# Patient Record
Sex: Female | Born: 1940 | Race: White | Hispanic: No | Smoking: Former smoker
Health system: Southern US, Community
[De-identification: ages and names within clinical notes are randomized; demographics above are authoritative.]

## PROBLEM LIST (undated history)

## (undated) ENCOUNTER — Emergency Department (HOSPITAL_COMMUNITY): Admission: EM | Payer: Medicare Other

## (undated) DIAGNOSIS — R5381 Other malaise: Secondary | ICD-10-CM

## (undated) DIAGNOSIS — F329 Major depressive disorder, single episode, unspecified: Secondary | ICD-10-CM

## (undated) DIAGNOSIS — N200 Calculus of kidney: Secondary | ICD-10-CM

## (undated) DIAGNOSIS — I609 Nontraumatic subarachnoid hemorrhage, unspecified: Secondary | ICD-10-CM

## (undated) DIAGNOSIS — C7A090 Malignant carcinoid tumor of the bronchus and lung: Secondary | ICD-10-CM

## (undated) DIAGNOSIS — R569 Unspecified convulsions: Secondary | ICD-10-CM

## (undated) DIAGNOSIS — M810 Age-related osteoporosis without current pathological fracture: Secondary | ICD-10-CM

## (undated) DIAGNOSIS — K831 Obstruction of bile duct: Secondary | ICD-10-CM

## (undated) DIAGNOSIS — F419 Anxiety disorder, unspecified: Secondary | ICD-10-CM

## (undated) DIAGNOSIS — K709 Alcoholic liver disease, unspecified: Secondary | ICD-10-CM

## (undated) DIAGNOSIS — K529 Noninfective gastroenteritis and colitis, unspecified: Secondary | ICD-10-CM

## (undated) DIAGNOSIS — K219 Gastro-esophageal reflux disease without esophagitis: Secondary | ICD-10-CM

## (undated) DIAGNOSIS — R296 Repeated falls: Secondary | ICD-10-CM

## (undated) DIAGNOSIS — F1020 Alcohol dependence, uncomplicated: Secondary | ICD-10-CM

## (undated) DIAGNOSIS — M1990 Unspecified osteoarthritis, unspecified site: Secondary | ICD-10-CM

## (undated) DIAGNOSIS — Z9189 Other specified personal risk factors, not elsewhere classified: Secondary | ICD-10-CM

## (undated) DIAGNOSIS — K769 Liver disease, unspecified: Secondary | ICD-10-CM

## (undated) DIAGNOSIS — F32A Depression, unspecified: Secondary | ICD-10-CM

## (undated) DIAGNOSIS — G5603 Carpal tunnel syndrome, bilateral upper limbs: Secondary | ICD-10-CM

## (undated) HISTORY — DX: Nontraumatic subarachnoid hemorrhage, unspecified: I60.9

## (undated) HISTORY — PX: ABDOMINAL HYSTERECTOMY: SHX81

## (undated) HISTORY — DX: Other specified personal risk factors, not elsewhere classified: Z91.89

## (undated) HISTORY — PX: TONSILLECTOMY: SUR1361

## (undated) HISTORY — PX: APPENDECTOMY: SHX54

## (undated) HISTORY — PX: TONSILLECTOMY: SHX5217

---

## 1999-05-16 ENCOUNTER — Ambulatory Visit (HOSPITAL_COMMUNITY): Admission: RE | Admit: 1999-05-16 | Discharge: 1999-05-16 | Payer: Self-pay | Admitting: Obstetrics & Gynecology

## 2000-10-28 ENCOUNTER — Other Ambulatory Visit: Admission: RE | Admit: 2000-10-28 | Discharge: 2000-10-28 | Payer: Self-pay | Admitting: Obstetrics and Gynecology

## 2000-11-10 ENCOUNTER — Encounter: Admission: RE | Admit: 2000-11-10 | Discharge: 2000-11-10 | Payer: Self-pay | Admitting: Obstetrics and Gynecology

## 2000-11-10 ENCOUNTER — Encounter: Payer: Self-pay | Admitting: Obstetrics and Gynecology

## 2002-11-03 ENCOUNTER — Ambulatory Visit (HOSPITAL_COMMUNITY): Admission: RE | Admit: 2002-11-03 | Discharge: 2002-11-03 | Payer: Self-pay | Admitting: Pulmonary Disease

## 2002-12-01 HISTORY — PX: SMALL INTESTINE SURGERY: SHX150

## 2002-12-01 HISTORY — PX: COLON RESECTION: SHX5231

## 2003-04-26 ENCOUNTER — Encounter: Admission: RE | Admit: 2003-04-26 | Discharge: 2003-04-26 | Payer: Self-pay | Admitting: Pulmonary Disease

## 2003-06-27 ENCOUNTER — Ambulatory Visit (HOSPITAL_COMMUNITY): Admission: RE | Admit: 2003-06-27 | Discharge: 2003-06-27 | Payer: Self-pay | Admitting: Internal Medicine

## 2003-06-28 ENCOUNTER — Encounter (INDEPENDENT_AMBULATORY_CARE_PROVIDER_SITE_OTHER): Payer: Self-pay | Admitting: Internal Medicine

## 2003-06-28 ENCOUNTER — Inpatient Hospital Stay (HOSPITAL_COMMUNITY): Admission: AD | Admit: 2003-06-28 | Discharge: 2003-07-04 | Payer: Medicare Other | Admitting: Internal Medicine

## 2003-06-29 ENCOUNTER — Encounter (INDEPENDENT_AMBULATORY_CARE_PROVIDER_SITE_OTHER): Payer: Self-pay | Admitting: Internal Medicine

## 2004-02-20 ENCOUNTER — Ambulatory Visit (HOSPITAL_COMMUNITY): Admission: RE | Admit: 2004-02-20 | Discharge: 2004-02-20 | Payer: Self-pay | Admitting: Internal Medicine

## 2004-03-29 ENCOUNTER — Ambulatory Visit (HOSPITAL_COMMUNITY): Admission: RE | Admit: 2004-03-29 | Discharge: 2004-03-29 | Payer: Self-pay | Admitting: Pulmonary Disease

## 2004-05-30 ENCOUNTER — Ambulatory Visit (HOSPITAL_COMMUNITY): Admission: RE | Admit: 2004-05-30 | Discharge: 2004-05-30 | Payer: Self-pay | Admitting: Pulmonary Disease

## 2004-11-19 ENCOUNTER — Ambulatory Visit: Payer: Self-pay | Admitting: Gastroenterology

## 2004-12-09 ENCOUNTER — Ambulatory Visit: Payer: Self-pay | Admitting: Gastroenterology

## 2004-12-10 ENCOUNTER — Ambulatory Visit: Payer: Self-pay | Admitting: Gastroenterology

## 2004-12-16 ENCOUNTER — Ambulatory Visit: Payer: Self-pay | Admitting: Gastroenterology

## 2004-12-24 ENCOUNTER — Ambulatory Visit: Payer: Self-pay | Admitting: Gastroenterology

## 2005-01-04 ENCOUNTER — Emergency Department (HOSPITAL_COMMUNITY): Admission: EM | Admit: 2005-01-04 | Discharge: 2005-01-04 | Payer: Self-pay | Admitting: Emergency Medicine

## 2005-02-27 ENCOUNTER — Ambulatory Visit: Payer: Self-pay | Admitting: Gastroenterology

## 2005-03-11 ENCOUNTER — Ambulatory Visit: Payer: Self-pay | Admitting: Gastroenterology

## 2005-12-01 HISTORY — PX: THORACOTOMY: SHX5074

## 2005-12-01 HISTORY — PX: THORACOTOMY: SUR1349

## 2005-12-10 ENCOUNTER — Ambulatory Visit (HOSPITAL_COMMUNITY): Admission: RE | Admit: 2005-12-10 | Discharge: 2005-12-10 | Payer: Self-pay | Admitting: Internal Medicine

## 2006-01-06 ENCOUNTER — Ambulatory Visit (HOSPITAL_COMMUNITY): Admission: RE | Admit: 2006-01-06 | Discharge: 2006-01-06 | Payer: Self-pay | Admitting: Orthopaedic Surgery

## 2006-02-10 ENCOUNTER — Ambulatory Visit (HOSPITAL_COMMUNITY): Admission: RE | Admit: 2006-02-10 | Discharge: 2006-02-10 | Payer: Self-pay | Admitting: Internal Medicine

## 2006-03-04 ENCOUNTER — Encounter: Admission: RE | Admit: 2006-03-04 | Discharge: 2006-03-04 | Payer: Self-pay | Admitting: Internal Medicine

## 2006-03-12 ENCOUNTER — Ambulatory Visit (HOSPITAL_COMMUNITY): Admission: RE | Admit: 2006-03-12 | Discharge: 2006-03-12 | Payer: Self-pay | Admitting: Internal Medicine

## 2006-03-30 ENCOUNTER — Encounter (INDEPENDENT_AMBULATORY_CARE_PROVIDER_SITE_OTHER): Payer: Self-pay | Admitting: *Deleted

## 2006-03-30 ENCOUNTER — Inpatient Hospital Stay (HOSPITAL_COMMUNITY): Admission: RE | Admit: 2006-03-30 | Discharge: 2006-04-03 | Payer: Self-pay | Admitting: Thoracic Surgery

## 2006-04-15 ENCOUNTER — Encounter: Admission: RE | Admit: 2006-04-15 | Discharge: 2006-04-15 | Payer: Self-pay | Admitting: Thoracic Surgery

## 2006-05-04 ENCOUNTER — Ambulatory Visit (HOSPITAL_COMMUNITY): Payer: Self-pay | Admitting: Oncology

## 2006-05-04 ENCOUNTER — Encounter: Admission: RE | Admit: 2006-05-04 | Discharge: 2006-05-04 | Payer: Self-pay | Admitting: Oncology

## 2006-05-13 ENCOUNTER — Encounter: Admission: RE | Admit: 2006-05-13 | Discharge: 2006-05-13 | Payer: Self-pay | Admitting: Thoracic Surgery

## 2006-07-13 ENCOUNTER — Emergency Department (HOSPITAL_COMMUNITY): Admission: EM | Admit: 2006-07-13 | Discharge: 2006-07-13 | Payer: Self-pay | Admitting: Emergency Medicine

## 2006-08-12 ENCOUNTER — Encounter: Admission: RE | Admit: 2006-08-12 | Discharge: 2006-08-12 | Payer: Self-pay | Admitting: Thoracic Surgery

## 2006-08-13 ENCOUNTER — Ambulatory Visit: Payer: Self-pay | Admitting: Gastroenterology

## 2006-08-17 ENCOUNTER — Ambulatory Visit: Payer: Self-pay | Admitting: Gastroenterology

## 2006-08-17 ENCOUNTER — Encounter (INDEPENDENT_AMBULATORY_CARE_PROVIDER_SITE_OTHER): Payer: Self-pay | Admitting: Specialist

## 2006-10-01 ENCOUNTER — Encounter: Admission: RE | Admit: 2006-10-01 | Discharge: 2006-10-01 | Payer: Self-pay | Admitting: Oncology

## 2006-10-20 ENCOUNTER — Ambulatory Visit (HOSPITAL_COMMUNITY): Payer: Self-pay | Admitting: Oncology

## 2006-11-05 ENCOUNTER — Encounter (HOSPITAL_COMMUNITY): Admission: RE | Admit: 2006-11-05 | Discharge: 2006-11-30 | Payer: Self-pay | Admitting: Oncology

## 2006-11-11 ENCOUNTER — Encounter: Admission: RE | Admit: 2006-11-11 | Discharge: 2006-11-11 | Payer: Self-pay | Admitting: Thoracic Surgery

## 2006-12-03 ENCOUNTER — Ambulatory Visit: Admission: RE | Admit: 2006-12-03 | Discharge: 2006-12-03 | Payer: Self-pay | Admitting: Oncology

## 2007-02-15 ENCOUNTER — Ambulatory Visit (HOSPITAL_COMMUNITY): Admission: RE | Admit: 2007-02-15 | Discharge: 2007-02-15 | Payer: Self-pay | Admitting: Internal Medicine

## 2007-04-08 ENCOUNTER — Encounter (HOSPITAL_COMMUNITY): Admission: RE | Admit: 2007-04-08 | Discharge: 2007-05-08 | Payer: Self-pay | Admitting: Oncology

## 2007-04-13 ENCOUNTER — Ambulatory Visit (HOSPITAL_COMMUNITY): Payer: Self-pay | Admitting: Oncology

## 2008-02-17 ENCOUNTER — Ambulatory Visit (HOSPITAL_COMMUNITY): Admission: RE | Admit: 2008-02-17 | Discharge: 2008-02-17 | Payer: Self-pay | Admitting: Internal Medicine

## 2008-06-27 ENCOUNTER — Ambulatory Visit (HOSPITAL_COMMUNITY): Admission: RE | Admit: 2008-06-27 | Discharge: 2008-06-27 | Payer: Self-pay | Admitting: Internal Medicine

## 2008-09-26 ENCOUNTER — Ambulatory Visit (HOSPITAL_COMMUNITY): Admission: RE | Admit: 2008-09-26 | Discharge: 2008-09-26 | Payer: Self-pay | Admitting: Internal Medicine

## 2008-10-30 ENCOUNTER — Ambulatory Visit (HOSPITAL_COMMUNITY): Admission: RE | Admit: 2008-10-30 | Discharge: 2008-10-30 | Payer: Self-pay | Admitting: Internal Medicine

## 2008-11-17 ENCOUNTER — Ambulatory Visit (HOSPITAL_BASED_OUTPATIENT_CLINIC_OR_DEPARTMENT_OTHER): Admission: RE | Admit: 2008-11-17 | Discharge: 2008-11-17 | Payer: Self-pay | Admitting: Urology

## 2008-11-17 ENCOUNTER — Encounter (INDEPENDENT_AMBULATORY_CARE_PROVIDER_SITE_OTHER): Payer: Self-pay | Admitting: Urology

## 2008-12-01 HISTORY — PX: CYSTOSCOPY W/ URETERAL STENT PLACEMENT: SHX1429

## 2009-02-02 ENCOUNTER — Encounter (INDEPENDENT_AMBULATORY_CARE_PROVIDER_SITE_OTHER): Payer: Self-pay | Admitting: Urology

## 2009-02-02 ENCOUNTER — Ambulatory Visit (HOSPITAL_BASED_OUTPATIENT_CLINIC_OR_DEPARTMENT_OTHER): Admission: RE | Admit: 2009-02-02 | Discharge: 2009-02-02 | Payer: Self-pay | Admitting: Urology

## 2009-02-19 ENCOUNTER — Ambulatory Visit (HOSPITAL_COMMUNITY): Admission: RE | Admit: 2009-02-19 | Discharge: 2009-02-19 | Payer: Self-pay | Admitting: Internal Medicine

## 2009-03-14 ENCOUNTER — Ambulatory Visit (HOSPITAL_COMMUNITY): Admission: RE | Admit: 2009-03-14 | Discharge: 2009-03-14 | Payer: Self-pay | Admitting: Internal Medicine

## 2010-02-21 ENCOUNTER — Ambulatory Visit (HOSPITAL_COMMUNITY): Admission: RE | Admit: 2010-02-21 | Discharge: 2010-02-21 | Payer: Self-pay | Admitting: Internal Medicine

## 2010-04-25 ENCOUNTER — Ambulatory Visit (HOSPITAL_COMMUNITY): Admission: RE | Admit: 2010-04-25 | Discharge: 2010-04-25 | Payer: Self-pay | Admitting: Internal Medicine

## 2010-08-26 ENCOUNTER — Emergency Department (HOSPITAL_COMMUNITY): Admission: EM | Admit: 2010-08-26 | Discharge: 2010-08-26 | Payer: Self-pay | Admitting: Emergency Medicine

## 2010-09-16 ENCOUNTER — Ambulatory Visit (HOSPITAL_COMMUNITY): Admission: RE | Admit: 2010-09-16 | Discharge: 2010-09-16 | Payer: Self-pay | Admitting: Urology

## 2010-12-22 ENCOUNTER — Encounter (HOSPITAL_COMMUNITY): Payer: Self-pay | Admitting: Oncology

## 2010-12-22 ENCOUNTER — Encounter: Payer: Self-pay | Admitting: Thoracic Surgery

## 2011-02-13 LAB — CBC
HCT: 35.7 % — ABNORMAL LOW (ref 36.0–46.0)
Hemoglobin: 11.9 g/dL — ABNORMAL LOW (ref 12.0–15.0)
MCH: 31.5 pg (ref 26.0–34.0)
MCHC: 33.5 g/dL (ref 30.0–36.0)
MCV: 94.2 fL (ref 78.0–100.0)
Platelets: 152 10*3/uL (ref 150–400)
RBC: 3.79 MIL/uL — ABNORMAL LOW (ref 3.87–5.11)
RDW: 13.6 % (ref 11.5–15.5)
WBC: 7.4 10*3/uL (ref 4.0–10.5)

## 2011-02-13 LAB — URINALYSIS, ROUTINE W REFLEX MICROSCOPIC
Bilirubin Urine: NEGATIVE
Glucose, UA: NEGATIVE mg/dL
Nitrite: NEGATIVE
Specific Gravity, Urine: >1.03 — ABNORMAL HIGH (ref 1.005–1.030)
Urobilinogen, UA: 0.2 mg/dL (ref 0.0–1.0)
pH: 5.5 (ref 5.0–8.0)

## 2011-02-13 LAB — DIFFERENTIAL
Basophils Absolute: 0 10*3/uL (ref 0.0–0.1)
Basophils Relative: 0 % (ref 0–1)
Eosinophils Absolute: 0 10*3/uL (ref 0.0–0.7)
Eosinophils Relative: 0 % (ref 0–5)
Lymphocytes Relative: 8 % — ABNORMAL LOW (ref 12–46)
Lymphs Abs: 0.6 10*3/uL — ABNORMAL LOW (ref 0.7–4.0)
Monocytes Absolute: 0.3 10*3/uL (ref 0.1–1.0)
Monocytes Relative: 4 % (ref 3–12)
Neutro Abs: 6.5 10*3/uL (ref 1.7–7.7)
Neutrophils Relative %: 88 % — ABNORMAL HIGH (ref 43–77)

## 2011-02-13 LAB — URINE MICROSCOPIC-ADD ON

## 2011-02-13 LAB — BASIC METABOLIC PANEL
BUN: 8 mg/dL (ref 6–23)
CO2: 24 mEq/L (ref 19–32)
Calcium: 9.3 mg/dL (ref 8.4–10.5)
Chloride: 107 mEq/L (ref 96–112)
Creatinine, Ser: 0.72 mg/dL (ref 0.4–1.2)
GFR calc Af Amer: >60 mL/min (ref >60)
GFR calc non Af Amer: >60 mL/min (ref >60)
Glucose, Bld: 97 mg/dL (ref 70–99)
Potassium: 3.6 mEq/L (ref 3.5–5.1)
Sodium: 140 mEq/L (ref 135–145)

## 2011-03-10 ENCOUNTER — Other Ambulatory Visit (HOSPITAL_COMMUNITY): Payer: Self-pay | Admitting: Internal Medicine

## 2011-03-10 DIAGNOSIS — Z139 Encounter for screening, unspecified: Secondary | ICD-10-CM

## 2011-03-11 ENCOUNTER — Ambulatory Visit (HOSPITAL_COMMUNITY)
Admission: RE | Admit: 2011-03-11 | Discharge: 2011-03-11 | Disposition: A | Discharge status: Home / Self Care | Payer: Medicare Other | Source: Ambulatory Visit | Attending: Internal Medicine | Admitting: Internal Medicine

## 2011-03-11 DIAGNOSIS — Z1231 Encounter for screening mammogram for malignant neoplasm of breast: Secondary | ICD-10-CM | POA: Insufficient documentation

## 2011-03-11 DIAGNOSIS — Z139 Encounter for screening, unspecified: Secondary | ICD-10-CM

## 2011-03-13 LAB — COMPREHENSIVE METABOLIC PANEL
ALT: 20 U/L (ref 0–35)
AST: 25 U/L (ref 0–37)
Albumin: 4.2 g/dL (ref 3.5–5.2)
Alkaline Phosphatase: 135 U/L — ABNORMAL HIGH (ref 39–117)
BUN: 10 mg/dL (ref 6–23)
CO2: 27 mEq/L (ref 19–32)
Calcium: 9.6 mg/dL (ref 8.4–10.5)
Chloride: 108 mEq/L (ref 96–112)
Creatinine, Ser: 0.77 mg/dL (ref 0.4–1.2)
GFR calc Af Amer: >60 mL/min (ref >60)
GFR calc non Af Amer: >60 mL/min (ref >60)
Glucose, Bld: 93 mg/dL (ref 70–99)
Potassium: 4.5 mEq/L (ref 3.5–5.1)
Sodium: 143 mEq/L (ref 135–145)
Total Bilirubin: 0.7 mg/dL (ref 0.3–1.2)
Total Protein: 6.7 g/dL (ref 6.0–8.3)

## 2011-03-13 LAB — CBC
HCT: 38.7 % (ref 36.0–46.0)
Hemoglobin: 12.5 g/dL (ref 12.0–15.0)
MCHC: 32.3 g/dL (ref 30.0–36.0)
MCV: 89.6 fL (ref 78.0–100.0)
Platelets: 217 10*3/uL (ref 150–400)
RBC: 4.31 MIL/uL (ref 3.87–5.11)
RDW: 14.4 % (ref 11.5–15.5)
WBC: 6.2 10*3/uL (ref 4.0–10.5)

## 2011-04-01 ENCOUNTER — Ambulatory Visit (INDEPENDENT_AMBULATORY_CARE_PROVIDER_SITE_OTHER): Payer: Medicare Other | Admitting: Urology

## 2011-04-01 ENCOUNTER — Ambulatory Visit (HOSPITAL_COMMUNITY)
Admission: RE | Admit: 2011-04-01 | Discharge: 2011-04-01 | Disposition: A | Discharge status: Home / Self Care | Payer: Medicare Other | Source: Ambulatory Visit | Attending: Internal Medicine | Admitting: Internal Medicine

## 2011-04-01 ENCOUNTER — Other Ambulatory Visit: Payer: Self-pay | Admitting: Urology

## 2011-04-01 ENCOUNTER — Ambulatory Visit (HOSPITAL_COMMUNITY)
Admission: RE | Admit: 2011-04-01 | Discharge: 2011-04-01 | Disposition: A | Discharge status: Home / Self Care | Payer: Medicare Other | Source: Ambulatory Visit | Attending: Urology | Admitting: Urology

## 2011-04-01 ENCOUNTER — Other Ambulatory Visit (HOSPITAL_COMMUNITY): Payer: Self-pay | Admitting: Internal Medicine

## 2011-04-01 DIAGNOSIS — R05 Cough: Secondary | ICD-10-CM

## 2011-04-01 DIAGNOSIS — N200 Calculus of kidney: Secondary | ICD-10-CM | POA: Insufficient documentation

## 2011-04-01 DIAGNOSIS — R059 Cough, unspecified: Secondary | ICD-10-CM

## 2011-04-01 DIAGNOSIS — R109 Unspecified abdominal pain: Secondary | ICD-10-CM | POA: Insufficient documentation

## 2011-04-15 NOTE — Op Note (Signed)
[name], [name]              ACCOUNT NO.:  [id number]   MEDICAL RECORD NO.:  [id number]          PATIENT TYPE:  AMB   LOCATION:  N[facility]C                         FACILITY:  [facility]   PHYSICIAN:  Ronald L. Rosana Hoes, M.D.  DATE OF BIRTH:  [birth year]   DATE OF PROCEDURE:  11/17/2008  DATE OF DISCHARGE:                               OPERATIVE REPORT   DIAGNOSIS:  Gross hematuria, left renal pelvic thickening on CT scan.   OPERATIVE PROCEDURE:  Cystourethroscopy, bilateral retrograde ureteral  pyelograms, left flexible ureterorenoscopy, biopsy of left ureteropelvic  junction lesion, brush biopsy of left ureteropelvic junction lesion and  left renal pelvic washings.   SURGEON:  Duane Lope. Rosana Hoes, M.D.   ANESTHESIA:  LMA.   ESTIMATED BLOOD LOSS:  Negligible.   TUBES:  A 26-cm 6-French Contour double pigtail stent.   DISPOSITION OF SPECIMENS:  To pathology.   INDICATIONS FOR PROCEDURE:  [name] is a very nice 70 year old white  female who presented with gross hematuria.  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  She has had colon surgery previously.  On  CT scan of the abdomen and pelvis, she was found to have what appeared  to be thickening of the left renal pelvis and after understanding the  risks, benefits and alternatives, would like to proceed with the above  procedure.   DESCRIPTION OF PROCEDURE:  The patient was placed in the supine  position.  After proper LMA anesthesia, she was placed in the dorsal  lithotomy position and prepped and draped with Betadine in a sterile  fashion.  Cystourethroscopy was performed with a 22.5-French Olympus  panendoscope.  Utilizing the 12 and 70-degree lenses, the bladder was  carefully inspected and noted to be without lesions.  A left retrograde  ureteral pyelogram was performed with a 6-French open-ended catheter.  There was noted to be a filling defect right at or slightly below the  ureteropelvic junction on the left  side.  It was approximately 5 mm in  diameter x 5 mm.  There appeared to be a slight proximal hydronephrosis.  A 0.038-French sensor wire was placed into the left renal pelvis.  A  dual lumen inserter was passed over it to the UPJ and a second 0.038-  French sensor wire was placed into the left renal pelvis.  The dual  lumen inserter was removed and an Olympus flexible ureterorenoscope was  placed into the left renal pelvis and 1 wire was removed.  Inspection of  the renal pelvis revealed there were no lesions and the calyces appeared  to be clear, however, at the ureteropelvic junction just distal to it,  was a circumferential frondular mass that was highly suspicious for  transitional cell carcinoma.  Utilizing cold cup biopsy forceps, a  biopsy was obtained from it, along with brush biopsies of it, and then  subsequent left renal pelvic washings with normal saline.  Each were  submitted to pathology.  Reinspection revealed that there was no  significant bleeding and no other lesions were noted throughout the  ureter.  Under fluoroscopic guidance,  a 26-cm 6-French Contour double  pigtail stent was placed.  It was in good position within the left renal  pelvis and within the bladder.  No pullout tether string was attached.  Next, right retrograde ureteral pyelogram was performed with a 6-French  open-ended catheter.  The entire collecting system  appeared to be normal on the right side:  The ureter, renal pelvis,  calyces, etc., and the system appeared to drain well.  The bladder was  drained.  The panendoscope was removed.  The patient was taken to the  recovery room stable.  All specimens were submitted to pathology.      Carney Rosana Hoes, M.D.  Electronically Signed     RLD/MEDQ  D:  11/17/2008  T:  11/18/2008  Job:  [id number]

## 2011-04-15 NOTE — Op Note (Signed)
[name], [name]              ACCOUNT NO.:  [id number]   MEDICAL RECORD NO.:  [id number]          PATIENT TYPE:  AMB   LOCATION:  N[facility]C                         FACILITY:  [facility]   PHYSICIAN:  Ronald L. Rosana Hoes, M.D.  DATE OF BIRTH:  [birth year]   DATE OF PROCEDURE:  02/02/2009  DATE OF DISCHARGE:                               OPERATIVE REPORT   DIAGNOSIS:  Left renal pelvic thickening, ureterolithiasis and  nephrolithiasis.   OPERATIVE PROCEDURE:  Cystourethroscopy, left retrograde pyelogram, left  flexible ureterorenoscopy, Holmium laser lithotripsy of large 6 mm stone  with partial fragment extraction and biopsy of left renal pelvis and  placement of left double-J stent.   SURGEON:  Abbie Sons, MD   ANESTHESIA:  LMA.   ESTIMATED BLOOD LOSS:  Negligible.   TUBES:  24 cm 7 [place] Contour double pigtail stent.   COMPLICATIONS:  None.   INDICATIONS FOR PROCEDURE:  [name] is a lovely 70 year old white  female who presented with originally some hematuria and was found to  have a thickened left renal pelvis along with nephrolithiasis.  She  underwent cysto and ureteroscopy with biopsies of the left renal pelvis  which showed atypia only.  There were some thickened areas that were  biopsied.  Postoperatively she returned and a stone had dislodged from  her kidney 6.6 mm at the UPJ and subsequently progressed to the upper  ureter.  After understanding risks, benefits and alternatives she has  elected to proceed with management of the left ureteral calculus and  rebiopsy of the area.   PROCEDURE IN DETAIL:  The patient was placed in supine position and  after proper LMA anesthesia was placed in the dorsal lithotomy position  and prepped and draped with Betadine in a sterile fashion.  Cystourethroscopy was performed with a 22.5 French Olympus panendoscope  utilizing 12 and 70 degree lenses.  The bladder was carefully inspected.  Both ureters were in normal location.  Under  fluoroscopic guidance a  0.038 French sensor wire was placed in the left renal pelvis.  The stone  could be visualized on fluoroscopy in the upper ureter.  Utilizing a  short ureteral access sheath and dilator the ureteral access sheath was  passed up to the ureteropelvic junction and the stone displaced into the  middle pole calyceal system.  Utilizing the digital flexible  ureterorenoscope the entire renal internal architecture was inspected  upper, middle and lower pole systems.  The stone was visualized in the  middle pole calyceal system and utilizing a 200 micron laser fiber on a  setting of 0.5 and repetition rate of 5 the stone was fragmented into  multiple sub millimeter fragments.  A few fragments were extracted with  a Nitinol basket for stone analysis but it was felt that further  extraction was not necessary.  It was essentially dust and that it would  injure the system to keep passing baskets.  On fluoroscopy further out  in the middle pole calyceal system at a separate branch calyx I noted  another stone.  This was visualized and there was some infundibular  stenosis and it was felt to get to the stone we would have to perform an  infundibulotomy with the laser.  We felt this had some danger and that  the stone was in the periphery, not growing and was probably not a  problem for passing at this point so we elected to leave this alone.  Inspection of the renal pelvis revealed some thickening  circumferentially near the ureteropelvic junction and this was biopsied  with the cold cup biopsy forceps and submitted to pathology.  Reinspection revealed good hemostasis was present.  No other lesions  were present.  Dye was injected through the scope to create a retrograde  ureteropyelogram and there was no extravasation.  No other filling  defects were noted.  The scope was visually removed.  A wire was placed  and under fluoroscopic guidance a 24 cm 7 French Contour double  pigtail  stent was placed and noted to be in good position within the left renal  pelvis within the bladder.  No pullout tether was attached.  The bladder  was drained.  The panendoscope was removed.  Again, fluoroscopic  confirmation of the stent location was performed and the patient was  taken to the recovery room stable.      Turner Rosana Hoes, M.D.  Electronically Signed     RLD/MEDQ  D:  02/02/2009  T:  02/02/2009  Job:  [id number]

## 2011-04-18 NOTE — H&P (Signed)
[name], [name]              ACCOUNT NO.:  [id number]   MEDICAL RECORD NO.:  [id number]          PATIENT TYPE:  INP   LOCATION:  NA                           FACILITY:  [facility]   PHYSICIAN:  Nicanor Alcon, M.D. DATE OF BIRTH:  04/05/1941   DATE OF ADMISSION:  03/30/2006  DATE OF DISCHARGE:                                HISTORY & PHYSICAL   CHIEF COMPLAINT:  Left upper lobe mass.   HISTORY OF PRESENT ILLNESS:  This 70 year old, Caucasian female quit smoking  in 1998, however, because of her smoking history a chest CT scan was done  for pulmonary nodules and was found to have two small pulmonary nodules in  the left upper lobe and the medial area of the left upper lobe.  They were 6  mm and 8 mm in size.  There were several other vague opacities in the right  lower lobe and left lower lobe.  PET scan was done which showed activity in  the left upper lobe nodules.  She has had no weight loss, fever, chill,  excessive sputum.  Her pulmonary function test showed an FVC of 2.52 and FEV-  1 of 1.94.   ALLERGIES:  No known drug allergies.   MEDICATIONS:  1.  Vitamin B12 shots for B12 insufficiency.  2.  Actonel for osteoporosis.   PAST MEDICAL HISTORY:  1.  Perforated colon requiring laparotomy after colonoscopy for a polyp      removal.  2.  Lumbar disc disease.   PAST SURGICAL HISTORY:  1.  Right breast biopsy.  2.  Previous appendectomy.  3.  Tonsillectomy.  4.  Hysterectomy.   FAMILY HISTORY:  Noncontributory.   SOCIAL HISTORY:  She is widowed with two children.  Occasional glass of  wine.  Quit smoking in 1998.   REVIEW OF SYSTEMS:  She is 112 pounds.  She is 5 feet 1 inch.  CARDIOVASCULAR:  She has no angina or atrial fibrillation.  PULMONARY:  She  has a productive cough.  GASTROINTESTINAL:  Occasional diarrhea.  GENITOURINARY:  No dysuria or frequent urinations.  VASCULAR:  No  claudications, DVT or TIAs.  NEUROLOGIC:  No headaches, blackouts or  seizures.   ORTHOPEDICS:  She has chronic arthritis.  PSYCHIATRIC:  No  deficits.  HEENT:  No change in eye sight or hearing.  HEMATOLOGIC:  No  problems with bleeding or anemia.   PHYSICAL EXAMINATION:  GENERAL:  Well-developed, Caucasian female.  VITAL SIGNS:  Blood pressure 148/80, pulse 100, respirations 18, saturations  92%.  HEENT:  Head is atraumatic.  Pupils equal round and reactive to light and  accommodation.  Tympanic membranes intact.  No septal deviation.  Throat  without lesions.  NECK:  Supple with no thyromegaly and no carotid bruits.  CHEST:  Clear to auscultation and percussion.  HEART:  Regular sinus rhythm with no murmurs.  ABDOMEN:  Soft.  No hepatosplenomegaly.  Surgical scars.  Bowel sounds  normal.  EXTREMITIES:  Pulses are 2+.  There is no clubbing or edema.  NEUROLOGIC:  Oriented x3.  Cranial nerves 2-12 intact.  Sensory and  motor  intact.   IMPRESSION:  1.  Osteoporosis.  2.  Vitamin B12 insufficiency.  3.  History of breast biopsy.  4.  History of colonic perforation.   PLAN:  Right VATS and wedge resection of the right upper lobe lesions.           ______________________________  Nicanor Alcon, M.D.     DPB/MEDQ  D:  03/27/2006  T:  03/27/2006  Job:  [id number]

## 2011-04-18 NOTE — Op Note (Signed)
[name], [name]              ACCOUNT NO.:  [id number]   MEDICAL RECORD NO.:  [id number]          PATIENT TYPE:  INP   LOCATION:  2550                         FACILITY:  [place]   PHYSICIAN:  Nicanor Alcon, M.D. DATE OF BIRTH:  [birth year]   DATE OF PROCEDURE:  DATE OF DISCHARGE:                                 OPERATIVE REPORT   PREOPERATIVE DIAGNOSIS:  Right upper lobe mass.   POSTOPERATIVE DIAGNOSIS:  Right upper lobe mass, questionable hematoma,  questionable carcinoid.   OPERATION PERFORMED:  Right VATS, mini anterior thoracotomy, wedge resection  of right upper lobe lesion.   SURGEON:  Nicanor Alcon, M.D.   FIRST ASSISTANT:  Leta Baptist, PAC.   After percutaneous insertion of all monitor lines, the patient underwent  general anesthesia.  She was turned to the right lateral thoracotomy  position.  A dual lumen tube was inserted.  The right lung was deflated.  She was prepped and draped in the usual sterile manner.  A trocar site was  made at the seventh intercostal space in the anterior axillary line.  In the  eighth intercostal space at the mid-axillary line, two trocars were  inserted.  The lesion area that you could see of the right upper lobe was  stuck to the superior vena cava, as well as the trachea.  For this reason,  an anterior fourth intercostal space thoracotomy was made to approximately 6-  7 cm.  The serratus was split and a small retractor was inserted.  We then  took down the adhesions of the superior vena cava and the trachea by  dividing it with scissors and electrocautery, coagulating all adhesions.  It  was markedly stuck and took a lot of careful dissection to get it off.  The  lesion was palpated and resected with the EZ 45 stapler with three  applications.  We looked around for nodes and did not see any definite  nodes.  A __________ was applied to the staple line.  Two chest tubes were  brought in through the anterior and posterior  axillary lines and tied in  place with 0 silk.  The Marcaine block was done in the usual fashion.  The  ON-Q catheter was placed under fluoroscopic guidance in the usual fashion.  The chest was closed with two pericostal, #1 Vicryl in the muscle area, and  3-0 Vicryl subcutaneous stitch.  She was returned to the recovery room in  stable condition.           ______________________________  Nicanor Alcon, M.D.     DPB/MEDQ  D:  03/30/2006  T:  03/30/2006  Job:  [id number]   cc:   Paula Compton. Willey Blade, MD  Fax: [phone]

## 2011-04-18 NOTE — Discharge Summary (Signed)
[name], [name]              ACCOUNT NO.:  [id number]   MEDICAL RECORD NO.:  [id number]          PATIENT TYPE:  INP   LOCATION:  [id number]                         FACILITY:  [place]   PHYSICIAN:  Nicanor Alcon, M.D. DATE OF BIRTH:  [birth year]   DATE OF ADMISSION:  03/30/2006  DATE OF DISCHARGE:                                 DISCHARGE SUMMARY   ADMISSION DIAGNOSIS:  Right upper lobe lung mass.   DISCHARGE DIAGNOSES:  1.  Perforated colon requiring laparotomy after colonoscopy for polyp      removal in the past.  2.  Lumbar disk disease.  3.  Osteoporosis.  4.  Right breast biopsy in the past.  5.  Previous appendectomy.  6.  Tonsillectomy.  7.  Hysterectomy.  8.  Right upper lobe lung mass, status post right video-assisted      thoracoscopic surgery, minithoracotomy, wedge resection of the right      upper lobe.   ALLERGIES:  NO KNOWN DRUG ALLERGIES.   HISTORY OF PRESENT ILLNESS:  The patient is a 70 year old Caucasian female  who quit smoking in 1998.  As a screening procedure, she underwent a chest  CT which revealed two small pulmonary nodules in the right upper lobe  abuting the mediastinum measuring 8 mm and 6 mm.  There were also vague  opacities in the right lower lobe and left lower lobe.  PET scan was  performed which showed slight activity in the right upper lobe nodules.  She  was therefore referred to Dr. Arlyce Dice for further evaluation and treatment.  The patient denied cough, fever, chills, hemoptysis, and excessive sputum,  as well as weight loss.  After evaluation of the patient, it was Dr.  Lorelei Pont opinion that the patient should proceed with right video-assisted  thoracoscopic surgery and right upper lobe wedge resection for definitive  diagnosis.   HOSPITAL COURSE:  The patient was admitted and taken to the OR on March 30, 2006 for a right video-assisted thoracoscopic surgery, minithoracotomy, and  wedge resection of the right upper lobe.  The patient  tolerated the  procedure well and was hemodynamically stable immediately postoperatively.  She was transferred from the OR to the postanesthesia care unit in stable  condition.  The patient was extubated without complication and woke up from  anesthesia neurologically intact.   The patient's postoperative course has progressed as expected.  On  postoperative day #1, her only complaint was of feeling oversedated.  Her  chest tubes were without air leak, and drainage was appropriate.  Her chest  x-ray was stable.  The patient was ambulated, and her pain medication was  decreased to decrease her sedation.  The remainder of her hospital course  was dedicated to removal of her chest tubes and ambulation.  The patient is  voiding well, and her bowel function has returned.  She is ambulating well.  Final pathology revealed a neuroendocrine carcinoma.   On postoperative day #3, the patient was afebrile with stable vital signs.  Her chest x-ray was stable.  On physical exam, cardiac is regular rate and  rhythm, lungs  reveal crackles in the right base, the abdomen is benign, and  the incision is clean, dry, and intact.  The patient is in stable condition  at this time, and as long as she continues to progress in the current  manner, will be ready for discharge within the next one to two days pending  morning round reevaluation.   LABORATORY DATA:  CBC and BMP on Mar 31, 2006:  White count 10.1, hemoglobin  10.3, hematocrit 31.2, platelets 178.  Sodium 136, potassium 3.6, BUN 5,  creatinine 0.6, glucose 125.   CONDITION ON DISCHARGE:  Stable.   DIET:  Low salt, low fat.   ACTIVITY:  No driving for one week.  No lifting for two weeks.  The patient  should continue her daily breathing and walking exercises.   WOUND CARE:  The patient should shower daily and clean the incisions with  soap and water.   DISCHARGE MEDICATIONS:  1.  Vitamin B12 shots.  2.  Actonel weekly.  3.  Ultram 50 mg one  to two q.4-6h. p.r.n. pain.   FOLLOW UP:  1.  The patient has an appointment at [facility] on Apr 15, 2006 at      11:10 a.m. for a PA and lateral chest x-ray.  2.  The patient has an appointment with Dr. Arlyce Dice on Apr 15, 2006 at 12:10.      Leta Baptist, PA    ______________________________  Nicanor Alcon, M.D.    AY/MEDQ  D:  04/02/2006  T:  04/03/2006  Job:  [id number]   cc:   Nicanor Alcon, M.D.  [address]  [place]  [place] [address]

## 2011-04-18 NOTE — Assessment & Plan Note (Signed)
[place] OFFICE NOTE   NAME:[name]                     MRN:          [id number]  DATE:08/13/2006                            DOB:          1941/02/20    [name] continues with watery diarrhea but no abdominal cramping.  She does  have gas and bleeding.  The cause of her diarrhea has remained obscure.  She  has no response whatsoever to empiric trials of antibiotics and bile-salt  binding resins.  I have not seen her in over a year.  She comes today  because of some occasional bright red blood per rectum.   EXAMINATION:  VITAL SIGNS:  Her vital signs are all normal and her weight is  stable.  ABDOMEN:  Entirely unremarkable.  RECTAL:  Inspection of the rectum shows external hemorrhoids without  fissures or fistulae.  The soft stool of the rectal vault is guaiac  negative.   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  Other consideration would be that she has microscopic-  collagenous colitis.  I have treated her in the past for bacterial  overgrowth without response, but she has not had a trial of Xifaxan therapy.  She had a computerized tomography of the abdomen and pelvis done recently on  August 13, which was unremarkable.   PLAN:  I scheduled colonoscopy and biopsies of this patient's colon, after  speaking to her in length about colonoscopy with its risk and benefits.  She  had previous perforation of her colon after colonoscopy resection of a  sigmoid polyp several years.   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 Arlyce Dice in April of 2007.  She has also had a  previous hysterectomy by Dr. Daivd Council.  She had previous workup for  carcinoid syndrome which included normal metanephrines and 5-HIAA levels in  April 2006.  Also previous sprue antibody titers have been negative.   She currently is on Actonel, multivitamins and calcium.  We will proceed  accordingly with colonoscopy and biopsies.  Should this be unremarkable, I  will consider treatment with Xifaxan 200 mg t.i.d. with probiotic therapy.  This patient is supposed to be on B12 shots and I think she is getting  through her primary care.                                  [name]. Angela Iles, MD, [facility], [place]   DRP/MedQ  DD:  08/13/2006  DT:  08/14/2006  Job #:  [id number]   cc:   Paula Compton. Willey Blade, MD  Gaston Islam. Tressie Stalker, MD  Nicanor Alcon, M.D.

## 2011-05-06 ENCOUNTER — Ambulatory Visit (HOSPITAL_COMMUNITY)
Admission: RE | Admit: 2011-05-06 | Discharge: 2011-05-06 | Disposition: A | Discharge status: Home / Self Care | Payer: Medicare Other | Source: Ambulatory Visit | Attending: Orthopaedic Surgery | Admitting: Orthopaedic Surgery

## 2011-05-06 DIAGNOSIS — IMO0001 Reserved for inherently not codable concepts without codable children: Secondary | ICD-10-CM | POA: Insufficient documentation

## 2011-05-06 DIAGNOSIS — M545 Low back pain, unspecified: Secondary | ICD-10-CM | POA: Insufficient documentation

## 2011-06-17 ENCOUNTER — Encounter (HOSPITAL_COMMUNITY): Payer: Medicare Other | Attending: Internal Medicine

## 2011-06-17 DIAGNOSIS — M818 Other osteoporosis without current pathological fracture: Secondary | ICD-10-CM | POA: Insufficient documentation

## 2011-06-17 MED ORDER — IBANDRONATE SODIUM 3 MG/3ML IV SOLN
3.0000 mg | Freq: Once | INTRAVENOUS | Status: AC
  Administered 2011-06-17: 3 mg via INTRAVENOUS

## 2011-06-17 MED ORDER — SODIUM CHLORIDE 0.9 % IJ SOLN
INTRAMUSCULAR | Status: AC
  Administered 2011-06-17: 10 mL via INTRAVENOUS
  Filled 2011-06-17: qty 10

## 2011-06-17 MED ORDER — IBANDRONATE SODIUM 3 MG/3ML IV SOLN
INTRAVENOUS | Status: AC
  Administered 2011-06-17: 3 mg via INTRAVENOUS
  Filled 2011-06-17: qty 3

## 2011-08-01 ENCOUNTER — Other Ambulatory Visit: Payer: Self-pay | Admitting: Urology

## 2011-08-01 ENCOUNTER — Ambulatory Visit (HOSPITAL_COMMUNITY)
Admission: RE | Admit: 2011-08-01 | Discharge: 2011-08-01 | Disposition: A | Discharge status: Home / Self Care | Payer: Medicare Other | Source: Ambulatory Visit | Attending: Urology | Admitting: Urology

## 2011-08-01 DIAGNOSIS — R109 Unspecified abdominal pain: Secondary | ICD-10-CM | POA: Insufficient documentation

## 2011-08-01 DIAGNOSIS — N200 Calculus of kidney: Secondary | ICD-10-CM

## 2011-08-05 ENCOUNTER — Ambulatory Visit (INDEPENDENT_AMBULATORY_CARE_PROVIDER_SITE_OTHER): Payer: Medicare Other | Admitting: Urology

## 2011-08-05 DIAGNOSIS — N200 Calculus of kidney: Secondary | ICD-10-CM

## 2011-09-05 LAB — COMPREHENSIVE METABOLIC PANEL
ALT: 18 U/L (ref 0–35)
AST: 21 U/L (ref 0–37)
Albumin: 4.2 g/dL (ref 3.5–5.2)
Alkaline Phosphatase: 123 U/L — ABNORMAL HIGH (ref 39–117)
BUN: 9 mg/dL (ref 6–23)
CO2: 27 mEq/L (ref 19–32)
Calcium: 9.4 mg/dL (ref 8.4–10.5)
Chloride: 104 mEq/L (ref 96–112)
Creatinine, Ser: 0.64 mg/dL (ref 0.4–1.2)
GFR calc Af Amer: >60 mL/min (ref >60)
GFR calc non Af Amer: >60 mL/min (ref >60)
Glucose, Bld: 83 mg/dL (ref 70–99)
Potassium: 3.3 mEq/L — ABNORMAL LOW (ref 3.5–5.1)
Sodium: 140 mEq/L (ref 135–145)
Total Bilirubin: 0.8 mg/dL (ref 0.3–1.2)
Total Protein: 6.8 g/dL (ref 6.0–8.3)

## 2011-09-05 LAB — URINALYSIS, ROUTINE W REFLEX MICROSCOPIC
Bilirubin Urine: NEGATIVE
Glucose, UA: NEGATIVE mg/dL
Ketones, ur: NEGATIVE mg/dL
Nitrite: NEGATIVE
Protein, ur: 30 mg/dL — AB
Specific Gravity, Urine: 1.014 (ref 1.005–1.030)
Urobilinogen, UA: 0.2 mg/dL (ref 0.0–1.0)
pH: 6 (ref 5.0–8.0)

## 2011-09-05 LAB — URINE MICROSCOPIC-ADD ON

## 2011-09-05 LAB — CBC
HCT: 40.8 % (ref 36.0–46.0)
Hemoglobin: 13.3 g/dL (ref 12.0–15.0)
MCHC: 32.6 g/dL (ref 30.0–36.0)
MCV: 90.8 fL (ref 78.0–100.0)
Platelets: 297 10*3/uL (ref 150–400)
RBC: 4.5 MIL/uL (ref 3.87–5.11)
RDW: 13.3 % (ref 11.5–15.5)
WBC: 5.6 10*3/uL (ref 4.0–10.5)

## 2011-09-05 LAB — APTT: aPTT: 37 seconds (ref 24–37)

## 2011-09-05 LAB — PROTIME-INR
INR: 0.9 (ref 0.00–1.49)
Prothrombin Time: 12 seconds (ref 11.6–15.2)

## 2011-09-16 ENCOUNTER — Ambulatory Visit (HOSPITAL_COMMUNITY): Payer: Medicare Other

## 2011-12-03 DIAGNOSIS — E538 Deficiency of other specified B group vitamins: Secondary | ICD-10-CM | POA: Diagnosis not present

## 2011-12-08 DIAGNOSIS — M25569 Pain in unspecified knee: Secondary | ICD-10-CM | POA: Diagnosis not present

## 2011-12-08 DIAGNOSIS — M19049 Primary osteoarthritis, unspecified hand: Secondary | ICD-10-CM | POA: Diagnosis not present

## 2011-12-08 DIAGNOSIS — M67919 Unspecified disorder of synovium and tendon, unspecified shoulder: Secondary | ICD-10-CM | POA: Diagnosis not present

## 2011-12-08 DIAGNOSIS — G5600 Carpal tunnel syndrome, unspecified upper limb: Secondary | ICD-10-CM | POA: Diagnosis not present

## 2011-12-09 DIAGNOSIS — G5600 Carpal tunnel syndrome, unspecified upper limb: Secondary | ICD-10-CM | POA: Diagnosis not present

## 2011-12-09 DIAGNOSIS — R209 Unspecified disturbances of skin sensation: Secondary | ICD-10-CM | POA: Diagnosis not present

## 2011-12-11 DIAGNOSIS — M19049 Primary osteoarthritis, unspecified hand: Secondary | ICD-10-CM | POA: Diagnosis not present

## 2011-12-11 DIAGNOSIS — M719 Bursopathy, unspecified: Secondary | ICD-10-CM | POA: Diagnosis not present

## 2011-12-11 DIAGNOSIS — M67919 Unspecified disorder of synovium and tendon, unspecified shoulder: Secondary | ICD-10-CM | POA: Diagnosis not present

## 2011-12-11 DIAGNOSIS — M25569 Pain in unspecified knee: Secondary | ICD-10-CM | POA: Diagnosis not present

## 2011-12-11 DIAGNOSIS — G5600 Carpal tunnel syndrome, unspecified upper limb: Secondary | ICD-10-CM | POA: Diagnosis not present

## 2011-12-19 DIAGNOSIS — G5600 Carpal tunnel syndrome, unspecified upper limb: Secondary | ICD-10-CM | POA: Diagnosis not present

## 2011-12-25 DIAGNOSIS — M67919 Unspecified disorder of synovium and tendon, unspecified shoulder: Secondary | ICD-10-CM | POA: Diagnosis not present

## 2011-12-25 DIAGNOSIS — M719 Bursopathy, unspecified: Secondary | ICD-10-CM | POA: Diagnosis not present

## 2012-01-02 HISTORY — PX: CARPAL TUNNEL RELEASE: SHX101

## 2012-01-05 DIAGNOSIS — E538 Deficiency of other specified B group vitamins: Secondary | ICD-10-CM | POA: Diagnosis not present

## 2012-01-09 DIAGNOSIS — M47812 Spondylosis without myelopathy or radiculopathy, cervical region: Secondary | ICD-10-CM | POA: Diagnosis not present

## 2012-01-12 ENCOUNTER — Other Ambulatory Visit: Payer: Self-pay | Admitting: Orthopedic Surgery

## 2012-01-19 ENCOUNTER — Encounter (HOSPITAL_BASED_OUTPATIENT_CLINIC_OR_DEPARTMENT_OTHER): Payer: Self-pay | Admitting: *Deleted

## 2012-01-19 NOTE — Progress Notes (Signed)
No labs needed

## 2012-01-20 ENCOUNTER — Encounter (HOSPITAL_BASED_OUTPATIENT_CLINIC_OR_DEPARTMENT_OTHER): Payer: Self-pay | Admitting: *Deleted

## 2012-01-20 DIAGNOSIS — F411 Generalized anxiety disorder: Secondary | ICD-10-CM | POA: Diagnosis not present

## 2012-01-21 ENCOUNTER — Encounter (HOSPITAL_BASED_OUTPATIENT_CLINIC_OR_DEPARTMENT_OTHER): Payer: Self-pay | Admitting: Orthopedic Surgery

## 2012-01-21 ENCOUNTER — Encounter (HOSPITAL_BASED_OUTPATIENT_CLINIC_OR_DEPARTMENT_OTHER): Payer: Self-pay | Admitting: Anesthesiology

## 2012-01-21 ENCOUNTER — Encounter (HOSPITAL_BASED_OUTPATIENT_CLINIC_OR_DEPARTMENT_OTHER)
Admission: RE | Disposition: A | Discharge status: Home / Self Care | Payer: Self-pay | Source: Ambulatory Visit | Attending: Orthopedic Surgery

## 2012-01-21 ENCOUNTER — Ambulatory Visit (HOSPITAL_BASED_OUTPATIENT_CLINIC_OR_DEPARTMENT_OTHER): Payer: Medicare Other | Admitting: Anesthesiology

## 2012-01-21 ENCOUNTER — Ambulatory Visit (HOSPITAL_BASED_OUTPATIENT_CLINIC_OR_DEPARTMENT_OTHER)
Admission: RE | Admit: 2012-01-21 | Discharge: 2012-01-21 | Disposition: A | Discharge status: Home / Self Care | Payer: Medicare Other | Source: Ambulatory Visit | Attending: Orthopedic Surgery | Admitting: Orthopedic Surgery

## 2012-01-21 ENCOUNTER — Encounter (HOSPITAL_BASED_OUTPATIENT_CLINIC_OR_DEPARTMENT_OTHER): Payer: Self-pay | Admitting: Certified Registered"

## 2012-01-21 DIAGNOSIS — K219 Gastro-esophageal reflux disease without esophagitis: Secondary | ICD-10-CM | POA: Diagnosis not present

## 2012-01-21 DIAGNOSIS — G5600 Carpal tunnel syndrome, unspecified upper limb: Secondary | ICD-10-CM | POA: Insufficient documentation

## 2012-01-21 HISTORY — DX: Calculus of kidney: N20.0

## 2012-01-21 HISTORY — DX: Anxiety disorder, unspecified: F41.9

## 2012-01-21 HISTORY — DX: Depression, unspecified: F32.A

## 2012-01-21 HISTORY — PX: CARPAL TUNNEL RELEASE: SHX101

## 2012-01-21 HISTORY — DX: Carpal tunnel syndrome, bilateral upper limbs: G56.03

## 2012-01-21 HISTORY — DX: Unspecified osteoarthritis, unspecified site: M19.90

## 2012-01-21 HISTORY — DX: Gastro-esophageal reflux disease without esophagitis: K21.9

## 2012-01-21 HISTORY — DX: Major depressive disorder, single episode, unspecified: F32.9

## 2012-01-21 LAB — POCT HEMOGLOBIN-HEMACUE: Hemoglobin: 12 g/dL (ref 12.0–15.0)

## 2012-01-21 SURGERY — CARPAL TUNNEL RELEASE
Anesthesia: Monitor Anesthesia Care | Site: Wrist | Laterality: Right | Wound class: Clean

## 2012-01-21 MED ORDER — CHLORHEXIDINE GLUCONATE 4 % EX LIQD: 60.0000 mL | Freq: Once | CUTANEOUS | Status: DC

## 2012-01-21 MED ORDER — 0.9 % SODIUM CHLORIDE (POUR BTL) OPTIME
TOPICAL | Status: DC | PRN
  Administered 2012-01-21: 100 mL

## 2012-01-21 MED ORDER — LACTATED RINGERS IV SOLN
INTRAVENOUS | Status: DC
  Administered 2012-01-21: 09:00:00 via INTRAVENOUS

## 2012-01-21 MED ORDER — MIDAZOLAM HCL 5 MG/5ML IJ SOLN
INTRAMUSCULAR | Status: DC | PRN
  Administered 2012-01-21: 1 mg via INTRAVENOUS
  Administered 2012-01-21: 0.5 mg via INTRAVENOUS

## 2012-01-21 MED ORDER — LIDOCAINE HCL (PF) 0.5 % IJ SOLN
INTRAMUSCULAR | Status: DC | PRN
  Administered 2012-01-21: 50 mL via INTRATHECAL

## 2012-01-21 MED ORDER — PROPOFOL 10 MG/ML IV EMUL
INTRAVENOUS | Status: DC | PRN
  Administered 2012-01-21: 100 ug/kg/min via INTRAVENOUS

## 2012-01-21 MED ORDER — LIDOCAINE HCL (CARDIAC) 20 MG/ML IV SOLN
INTRAVENOUS | Status: DC | PRN
  Administered 2012-01-21: 50 mg via INTRAVENOUS

## 2012-01-21 MED ORDER — CEFAZOLIN SODIUM 1-5 GM-% IV SOLN
1.0000 g | INTRAVENOUS | Status: AC
  Administered 2012-01-21: 1 g via INTRAVENOUS

## 2012-01-21 MED ORDER — HYDROCODONE-ACETAMINOPHEN 5-500 MG PO TABS: 1.0000 | ORAL_TABLET | ORAL | Status: AC | PRN

## 2012-01-21 MED ORDER — FENTANYL CITRATE 0.05 MG/ML IJ SOLN
INTRAMUSCULAR | Status: DC | PRN
  Administered 2012-01-21: 25 ug via INTRAVENOUS
  Administered 2012-01-21 (×2): 50 ug via INTRAVENOUS
  Administered 2012-01-21: 25 ug via INTRAVENOUS

## 2012-01-21 MED ORDER — BUPIVACAINE HCL (PF) 0.25 % IJ SOLN
INTRAMUSCULAR | Status: DC | PRN
  Administered 2012-01-21: 5 mL

## 2012-01-21 MED ORDER — ONDANSETRON HCL 4 MG/2ML IJ SOLN
INTRAMUSCULAR | Status: DC | PRN
  Administered 2012-01-21: 4 mg via INTRAVENOUS

## 2012-01-21 SURGICAL SUPPLY — 35 items
BANDAGE GAUZE ELAST BULKY 4 IN (GAUZE/BANDAGES/DRESSINGS) ×3 IMPLANT
BLADE SURG 15 STRL LF DISP TIS (BLADE) ×1 IMPLANT
BLADE SURG 15 STRL SS (BLADE) ×2
BNDG CMPR 9X4 STRL LF SNTH (GAUZE/BANDAGES/DRESSINGS)
BNDG COHESIVE 3X5 TAN STRL LF (GAUZE/BANDAGES/DRESSINGS) ×2 IMPLANT
BNDG ESMARK 4X9 LF (GAUZE/BANDAGES/DRESSINGS) IMPLANT
CHLORAPREP W/TINT 26ML (MISCELLANEOUS) ×2 IMPLANT
CLOTH BEACON ORANGE TIMEOUT ST (SAFETY) ×2 IMPLANT
CORDS BIPOLAR (ELECTRODE) ×2 IMPLANT
COVER MAYO STAND STRL (DRAPES) ×2 IMPLANT
COVER TABLE BACK 60X90 (DRAPES) ×2 IMPLANT
CUFF TOURNIQUET SINGLE 18IN (TOURNIQUET CUFF) ×2 IMPLANT
DRAPE EXTREMITY T 121X128X90 (DRAPE) ×2 IMPLANT
DRAPE SURG 17X23 STRL (DRAPES) ×2 IMPLANT
DRSG KUZMA FLUFF (GAUZE/BANDAGES/DRESSINGS) ×2 IMPLANT
GAUZE XEROFORM 1X8 LF (GAUZE/BANDAGES/DRESSINGS) ×2 IMPLANT
GLOVE BIO SURGEON STRL SZ7 (GLOVE) ×1 IMPLANT
GLOVE SURG ORTHO 8.0 STRL STRW (GLOVE) ×2 IMPLANT
GOWN BRE IMP PREV XXLGXLNG (GOWN DISPOSABLE) ×2 IMPLANT
GOWN PREVENTION PLUS XLARGE (GOWN DISPOSABLE) ×2 IMPLANT
NEEDLE 27GAX1X1/2 (NEEDLE) ×1 IMPLANT
NS IRRIG 1000ML POUR BTL (IV SOLUTION) ×2 IMPLANT
PACK BASIN DAY SURGERY FS (CUSTOM PROCEDURE TRAY) ×2 IMPLANT
PAD CAST 3X4 CTTN HI CHSV (CAST SUPPLIES) ×1 IMPLANT
PADDING CAST ABS 4INX4YD NS (CAST SUPPLIES) ×1
PADDING CAST ABS COTTON 4X4 ST (CAST SUPPLIES) ×1 IMPLANT
PADDING CAST COTTON 3X4 STRL (CAST SUPPLIES)
SPONGE GAUZE 4X4 12PLY (GAUZE/BANDAGES/DRESSINGS) ×2 IMPLANT
STOCKINETTE 4X48 STRL (DRAPES) ×2 IMPLANT
SUT VICRYL 4-0 PS2 18IN ABS (SUTURE) IMPLANT
SUT VICRYL RAPIDE 4/0 PS 2 (SUTURE) ×2 IMPLANT
SYR BULB 3OZ (MISCELLANEOUS) ×2 IMPLANT
SYR CONTROL 10ML LL (SYRINGE) ×1 IMPLANT
TOWEL OR 17X24 6PK STRL BLUE (TOWEL DISPOSABLE) ×2 IMPLANT
UNDERPAD 30X30 INCONTINENT (UNDERPADS AND DIAPERS) ×2 IMPLANT

## 2012-01-21 NOTE — Anesthesia Preprocedure Evaluation (Signed)
Anesthesia Evaluation  Patient identified by MRN, date of birth, ID band Patient awake    Reviewed: Allergy & Precautions, H&P , NPO status , Patient's Chart, lab work & pertinent test results, reviewed documented beta blocker date and time   Airway Mallampati: II TM Distance: >3 FB Neck ROM: full    Dental   Pulmonary neg pulmonary ROS,          Cardiovascular neg cardio ROS     Neuro/Psych PSYCHIATRIC DISORDERS  Neuromuscular disease    GI/Hepatic negative GI ROS, Neg liver ROS, GERD-  Medicated and Controlled,  Endo/Other  Negative Endocrine ROS  Renal/GU negative Renal ROS  Genitourinary negative   Musculoskeletal   Abdominal   Peds  Hematology negative hematology ROS (+)   Anesthesia Other Findings See surgeon's H&P   Reproductive/Obstetrics negative OB ROS                           Anesthesia Physical Anesthesia Plan  ASA: II  Anesthesia Plan: Bier Block and MAC   Post-op Pain Management:    Induction: Intravenous  Airway Management Planned: Simple Face Mask  Additional Equipment:   Intra-op Plan:   Post-operative Plan: Extubation in OR  Informed Consent: I have reviewed the patients History and Physical, chart, labs and discussed the procedure including the risks, benefits and alternatives for the proposed anesthesia with the patient or authorized representative who has indicated his/her understanding and acceptance.     Plan Discussed with: CRNA and Surgeon  Anesthesia Plan Comments:         Anesthesia Quick Evaluation

## 2012-01-21 NOTE — H&P (Signed)
[name] is a 71 year-old female referred by Dr. Willey Blade for consultation with respect to painful numbness and burning in her hands. This is constant.  It started out as extremely severe and now has become more moderate giving her a burning, prickly type feeling with a feeling of numbness of both hands. She has had no history of injury to her hands, but has had whiplash. This has not improved with Neurontin 300 three times a day for the past two months.  She is also taking Protonix. She has taken Motrin without relief. She is not awakened at night.  She has history of arthritis, no history of diabetes, thyroid problems or gout.  She is also complaining of some discomfort in her feet.   ALLERGIES:   None.  MEDICATIONS:    Protonix and Neurontin.  SURGICAL HISTORY:    Appendectomy, lung carcinoid removed, colon repair   FAMILY MEDICAL HISTORY:   Positive for high blood pressure and arthritis.  SOCIAL HISTORY:    She does not smoke or drink.  REVIEW OF SYSTEMS:    Positive for glasses, contacts, blood in her stool, kidney disease, easy bruising, otherwise negative.  [name] is an 71 y.o. female.   Chief Complaint: CTS rt HPI: see above  Past Medical History  Diagnosis Date  . Carpal tunnel syndrome, bilateral   . Kidney stones   . GERD (gastroesophageal reflux disease)   . Anxiety   . Depression   . Arthritis     Past Surgical History  Procedure Date  . Appendectomy   . Thoracotomy 2007    vatz-rt upper lobe  . Cystoscopy w/ ureteral stent placement 2010    lt-lazer stone  . Colon resection 2004    perf bowel after colonoscopy  . Tonsillectomy     History reviewed. No pertinent family history. Social History:  reports that she has never smoked. She does not have any smokeless tobacco history on file. She reports that she does not drink alcohol or use illicit drugs.  Allergies: No Known Allergies  No current facility-administered medications on file as of  01/21/2012.   Medications Prior to Admission  Medication Sig Dispense Refill  . cholecalciferol (VITAMIN D) 1000 UNITS tablet Take 1,000 Units by mouth daily.      . cycloSPORINE (RESTASIS) 0.05 % ophthalmic emulsion 1 drop 2 (two) times daily.      Marland Kitchen gabapentin (NEURONTIN) 300 MG capsule Take 300 mg by mouth Nightly.      . Multiple Vitamin (MULTIVITAMIN) capsule Take 1 capsule by mouth daily.      . pantoprazole (PROTONIX) 40 MG tablet Take 40 mg by mouth daily.      . traMADol (ULTRAM) 50 MG tablet Take 50 mg by mouth every 6 (six) hours as needed.      . traZODone (DESYREL) 50 MG tablet Take 50 mg by mouth at bedtime.      Marland Kitchen venlafaxine (EFFEXOR) 75 MG tablet Take 75 mg by mouth 1 day or 1 dose.        No results found for this or any previous visit (from the past 48 hour(s)).  No results found.   Pertinent items are noted in HPI.  Height 5' (1.524 m), weight 47.628 kg (105 lb).  General appearance: alert, cooperative and appears stated age Head: Normocephalic, without obvious abnormality, asymmetric shape Neck: no adenopathy Resp: clear to auscultation bilaterally Cardio: regular rate and rhythm, S1, S2 normal, no murmur, click, rub or  gallop GI: soft, non-tender; bowel sounds normal; no masses,  no organomegaly Extremities: extremities normal, atraumatic, no cyanosis or edema Pulses: 2+ and symmetric Skin: Skin color, texture, turgor normal. No rashes or lesions Neurologic: Grossly normal Incision/Wound: na  Assessment/Plan She has seen Dr. Leta Baptist with nerve conductions.  This appears to be primarily a carpal tunnel syndrome, fairly significant on the left, to a lesser extent on her right with motor delay of 7.9 on her left side and no response to the sensory component on her left.  On her right side it is 4.4 and 5.0, motor and sensory component. She would like to have this surgically intervened.  The pre, peri and postoperative course were discussed along with the  risks and complications.  The patient is aware there is no guarantee with the surgery, possibility of infection, recurrence, injury to arteries, nerves, tendons, incomplete relief of symptoms and dystrophy.  Maryn Freelove R 01/21/2012, 8:33 AM

## 2012-01-21 NOTE — Op Note (Signed)
Dictated number: [id number]

## 2012-01-21 NOTE — Brief Op Note (Signed)
01/21/2012  10:25 AM  PATIENT:  Wallis Mart  71 y.o. female  PRE-OPERATIVE DIAGNOSIS:  Right carpal tunnel syndrome  POST-OPERATIVE DIAGNOSIS:  Right carpal tunnel syndrome  PROCEDURE:  Procedure(s) (LRB): CARPAL TUNNEL RELEASE (Right)  SURGEON:  Surgeon(s) and Role:    * Wynonia Sours, MD - Primary  PHYSICIAN ASSISTANT:   ASSISTANTS: none   ANESTHESIA:   local and regional  EBL:  Total I/O In: 500 [I.V.:500] Out: -   BLOOD ADMINISTERED:none  DRAINS: none   LOCAL MEDICATIONS USED:  MARCAINE     SPECIMEN:  No Specimen  DISPOSITION OF SPECIMEN:  N/A  COUNTS:  YES  TOURNIQUET:   Total Tourniquet Time Documented: Forearm (Right) - 18 minutes  DICTATION: .Other Dictation: Dictation Number [id number]  PLAN OF CARE: Discharge to home after PACU  PATIENT DISPOSITION:  PACU - hemodynamically stable.

## 2012-01-21 NOTE — Op Note (Signed)
NAME:  [name], HALLOCK            ACCOUNT NO.:  [id number]  MEDICAL RECORD NO.:  [id number]  LOCATION:                                 FACILITY:  PHYSICIAN:  Daryll Brod, M.D.       DATE OF BIRTH:  [birth year]  DATE OF PROCEDURE:  01/21/2012 DATE OF DISCHARGE:                              OPERATIVE REPORT   PREOPERATIVE DIAGNOSIS:  Carpal tunnel syndrome, right hand.  POSTOPERATIVE DIAGNOSIS:  Carpal tunnel syndrome, right hand.  OPERATION:  Decompression of right median nerve.  SURGEON:  Daryll Brod, M.D.  ANESTHESIA:  Forearm-based IV regional with local infiltration.  ANESTHESIOLOGIST:  Jessy Oto. Albertina Parr, M.D.  HISTORY:  The patient is a 71 year old female with a history of carpal tunnel syndrome, EMG nerve conductions positive.  This did not respond to conservative treatment.  She has elected to undergo surgical decompression.  Pre, peri, postoperative course were discussed along with risks and complications.  She is aware that there is no guarantee with surgery, possibility of infection, recurrence, injury to arteries, nerves, tendons, incomplete relief of symptoms, dystrophy.  Preoperative area, the patient is seen, the extremity marked by both the patient and surgeon.  Antibiotic given.  PROCEDURE:  The patient was brought to the operating room where a forearm-based IV regional anesthetic was carried out without difficulty. She was prepped using ChloraPrep, supine position, right arm free. Three minutes dry time was allowed.  Time-out taken, confirming the patient procedure.  A longitudinal incision was made in the skin, she had some feeling.  A local infiltration with 0.25% Marcaine without epinephrine was given approximately 6 mL was used.  The wound was then deepened with blunt dissection.  The palmar fascia was split. Superficial palmar arch identified.  Bleeders electrocauterized with bipolar.  The distal margin of the flexor retinaculum was  identified. Retractors placed.  A small incision was made.  The flexor tendon of the ring and little finger identified to the ulnar side of median nerve. The carpal retinaculum was incised with sharp dissection.  Right angle and Sewall retractor were placed between the skin and forearm fascia. The fascia released for approximately a cm and half proximal to the wrist crease under the direct vision.  Canal was explored.  Air compression to the nerve apparent, no further lesions were identified.  The wound was irrigated. Skin closed with interrupted 4-0 Vicryl Rapide sutures.  A sterile compressive dressing was applied with the fingers free.  On deflation of the tourniquet, all fingers immediately pinked.  She was taken to the recovery room for observation in satisfactory condition.          ______________________________ Daryll Brod, M.D.     GK/MEDQ  D:  01/21/2012  T:  01/21/2012  Job:  [id number]

## 2012-01-21 NOTE — Transfer of Care (Signed)
Immediate Anesthesia Transfer of Care Note  Patient: [name]  Procedure(s) Performed: Procedure(s) (LRB): CARPAL TUNNEL RELEASE (Right)  Patient Location: PACU  Anesthesia Type: Bier block  Level of Consciousness: awake, alert , oriented and patient cooperative  Airway & Oxygen Therapy: Patient Spontanous Breathing and Patient connected to face mask oxygen  Post-op Assessment: Report given to PACU RN and Post -op Vital signs reviewed and stable  Post vital signs: Reviewed and stable  Complications: No apparent anesthesia complications

## 2012-01-21 NOTE — Discharge Instructions (Addendum)
[facility] Instructions Hand Surgery  Wound Care: Keep your hand elevated above the level of your heart.  Do not allow it to dangle  by your side.  Keep the dressing dry and do not remove it unless your doctor advises you to do so.  He will usually change it at the time of your post-op visit.  Moving your fingers is advised to stimulate circulation but will depend on the site of your surgery.  If you have a splint applied, your doctor will advise you regarding movement.  Activity: Do not drive or operate machinery today.  Rest today and then you may return to your normal activity and work as indicated by your physician.  Diet:  Drink liquids today or eat a light diet.  You may resume a regular diet tomorrow.    General expectations: Pain for two to three days. Fingers may become slightly swollen.  Call your doctor if any of the following occur: Severe pain not relieved by pain medication. Elevated temperature. Dressing soaked with blood. Inability to move fingers. White or bluish color to fingers.[facility]  [address] [place],  [address] [phone]   Post Anesthesia Home Care Instructions  Activity: Get plenty of rest for the remainder of the day. A responsible adult should stay with you for 24 hours following the procedure.  For the next 24 hours, DO NOT: -Drive a car -Operate machinery -Drink alcoholic beverages -Take any medication unless instructed by your physician -Make any legal decisions or sign important papers.  Meals: Start with liquid foods such as gelatin or soup. Progress to regular foods as tolerated. Avoid greasy, spicy, heavy foods. If nausea and/or vomiting occur, drink only clear liquids until the nausea and/or vomiting subsides. Call your physician if vomiting continues.  Special Instructions/Symptoms: Your throat may feel dry or sore from the anesthesia or the breathing tube placed in your throat during surgery. If  this causes discomfort, gargle with warm salt water. The discomfort should disappear within 24 hours.   

## 2012-01-21 NOTE — Anesthesia Postprocedure Evaluation (Signed)
Anesthesia Post Note  Patient: [name]  Procedure(s) Performed: Procedure(s) (LRB): CARPAL TUNNEL RELEASE (Right)  Anesthesia type: MAC  Patient location: PACU  Post pain: Pain level controlled  Post assessment: Patient's Cardiovascular Status Stable  Last Vitals:  Filed Vitals:   01/21/12 1100  BP: 143/76  Pulse: 88  Temp:   Resp: 16    Post vital signs: Reviewed and stable  Level of consciousness: alert  Complications: No apparent anesthesia complications

## 2012-01-22 ENCOUNTER — Encounter (HOSPITAL_BASED_OUTPATIENT_CLINIC_OR_DEPARTMENT_OTHER): Payer: Self-pay | Admitting: Orthopedic Surgery

## 2012-01-29 DIAGNOSIS — G5600 Carpal tunnel syndrome, unspecified upper limb: Secondary | ICD-10-CM | POA: Diagnosis not present

## 2012-02-06 DIAGNOSIS — E538 Deficiency of other specified B group vitamins: Secondary | ICD-10-CM | POA: Diagnosis not present

## 2012-02-11 DIAGNOSIS — F411 Generalized anxiety disorder: Secondary | ICD-10-CM | POA: Diagnosis not present

## 2012-03-11 DIAGNOSIS — E538 Deficiency of other specified B group vitamins: Secondary | ICD-10-CM | POA: Diagnosis not present

## 2012-03-19 ENCOUNTER — Other Ambulatory Visit (HOSPITAL_COMMUNITY): Payer: Self-pay | Admitting: Internal Medicine

## 2012-03-19 DIAGNOSIS — Z139 Encounter for screening, unspecified: Secondary | ICD-10-CM

## 2012-03-22 ENCOUNTER — Ambulatory Visit (HOSPITAL_COMMUNITY)
Admission: RE | Admit: 2012-03-22 | Discharge: 2012-03-22 | Disposition: A | Discharge status: Home / Self Care | Payer: Medicare Other | Source: Ambulatory Visit | Attending: Internal Medicine | Admitting: Internal Medicine

## 2012-03-22 DIAGNOSIS — Z1231 Encounter for screening mammogram for malignant neoplasm of breast: Secondary | ICD-10-CM | POA: Diagnosis not present

## 2012-03-22 DIAGNOSIS — Z139 Encounter for screening, unspecified: Secondary | ICD-10-CM

## 2012-03-23 DIAGNOSIS — F411 Generalized anxiety disorder: Secondary | ICD-10-CM | POA: Diagnosis not present

## 2012-04-02 DIAGNOSIS — M2550 Pain in unspecified joint: Secondary | ICD-10-CM | POA: Diagnosis not present

## 2012-04-02 DIAGNOSIS — E559 Vitamin D deficiency, unspecified: Secondary | ICD-10-CM | POA: Diagnosis not present

## 2012-04-02 DIAGNOSIS — Z79899 Other long term (current) drug therapy: Secondary | ICD-10-CM | POA: Diagnosis not present

## 2012-04-09 DIAGNOSIS — N200 Calculus of kidney: Secondary | ICD-10-CM | POA: Diagnosis not present

## 2012-04-09 DIAGNOSIS — I4949 Other premature depolarization: Secondary | ICD-10-CM | POA: Diagnosis not present

## 2012-04-09 DIAGNOSIS — G5600 Carpal tunnel syndrome, unspecified upper limb: Secondary | ICD-10-CM | POA: Diagnosis not present

## 2012-04-09 DIAGNOSIS — Z1212 Encounter for screening for malignant neoplasm of rectum: Secondary | ICD-10-CM | POA: Diagnosis not present

## 2012-04-09 DIAGNOSIS — K5289 Other specified noninfective gastroenteritis and colitis: Secondary | ICD-10-CM | POA: Diagnosis not present

## 2012-04-12 DIAGNOSIS — E538 Deficiency of other specified B group vitamins: Secondary | ICD-10-CM | POA: Diagnosis not present

## 2012-05-14 DIAGNOSIS — E538 Deficiency of other specified B group vitamins: Secondary | ICD-10-CM | POA: Diagnosis not present

## 2012-06-14 DIAGNOSIS — E538 Deficiency of other specified B group vitamins: Secondary | ICD-10-CM | POA: Diagnosis not present

## 2012-06-24 DIAGNOSIS — F411 Generalized anxiety disorder: Secondary | ICD-10-CM | POA: Diagnosis not present

## 2012-07-12 DIAGNOSIS — M719 Bursopathy, unspecified: Secondary | ICD-10-CM | POA: Diagnosis not present

## 2012-07-12 DIAGNOSIS — M47812 Spondylosis without myelopathy or radiculopathy, cervical region: Secondary | ICD-10-CM | POA: Diagnosis not present

## 2012-07-12 DIAGNOSIS — M67919 Unspecified disorder of synovium and tendon, unspecified shoulder: Secondary | ICD-10-CM | POA: Diagnosis not present

## 2012-07-19 DIAGNOSIS — E538 Deficiency of other specified B group vitamins: Secondary | ICD-10-CM | POA: Diagnosis not present

## 2012-07-29 DIAGNOSIS — M47812 Spondylosis without myelopathy or radiculopathy, cervical region: Secondary | ICD-10-CM | POA: Diagnosis not present

## 2012-08-04 DIAGNOSIS — F411 Generalized anxiety disorder: Secondary | ICD-10-CM | POA: Diagnosis not present

## 2012-08-09 ENCOUNTER — Ambulatory Visit (HOSPITAL_COMMUNITY)
Admission: RE | Admit: 2012-08-09 | Discharge: 2012-08-09 | Disposition: A | Discharge status: Home / Self Care | Payer: Medicare Other | Source: Ambulatory Visit | Attending: Specialist | Admitting: Specialist

## 2012-08-09 DIAGNOSIS — IMO0001 Reserved for inherently not codable concepts without codable children: Secondary | ICD-10-CM | POA: Diagnosis not present

## 2012-08-09 DIAGNOSIS — M6281 Muscle weakness (generalized): Secondary | ICD-10-CM | POA: Insufficient documentation

## 2012-08-09 DIAGNOSIS — M25519 Pain in unspecified shoulder: Secondary | ICD-10-CM | POA: Insufficient documentation

## 2012-08-09 DIAGNOSIS — M7542 Impingement syndrome of left shoulder: Secondary | ICD-10-CM | POA: Insufficient documentation

## 2012-08-09 NOTE — Evaluation (Signed)
Occupational Therapy Evaluation  Patient Details  Name: [name] MRN: [id number] Date of Birth: 25-Jun-1941  Today's Date: 08/09/2012 Time: 1310-1430 OT Time Calculation (min): 80 min OT Evaluation 1310-1340 30' Manual Therapy 1340-1350 10' IFES with heat 15' Visit#: 1  of 16   Re-eval: 09/06/12  Assessment Diagnosis: Left Shoulder Impingement Bursitis, Tendonitis Next MD Visit: unscheduled Prior Therapy: N/A  Authorization: Medicare  Authorization Time Period: before 10th visit  Authorization Visit#: 1  of 10    Past Medical History:  Past Medical History  Diagnosis Date  . Carpal tunnel syndrome, bilateral   . Kidney stones   . GERD (gastroesophageal reflux disease)   . Anxiety   . Depression   . Arthritis    Past Surgical History:  Past Surgical History  Procedure Date  . Appendectomy   . Thoracotomy 2007    vatz-rt upper lobe  . Cystoscopy w/ ureteral stent placement 2010    lt-lazer stone  . Colon resection 2004    perf bowel after colonoscopy  . Tonsillectomy   . Carpal tunnel release 01/21/2012    Procedure: CARPAL TUNNEL RELEASE;  Surgeon: Wynonia Sours, MD;  Location: [place];  Service: Orthopedics;  Laterality: Right;    Subjective  S:  I began having pain in my left shoulder on 08/09.   Pertinent History: Ms. Pirkl has been experiencing increased pain in her left shoulder, particularly with forward and backward movment while arm is abducted for approximately 6 weeks.  She has been referred to occupational therapy for evaluation and treatment.   Special Tests: UEFI 12/68= 18% Patient Stated Goals: I want to be back to normal. Pain Assessment Currently in Pain?: Yes Pain Score:   8 Pain Location: Shoulder Pain Orientation: Left;Anterior;Posterior;Proximal Pain Type: Acute pain  Precautions/Restrictions  Precautions Precautions: None  Prior Functioning  Home Living Lives With: Alone Prior Function Level of  Independence: Independent with basic ADLs;Independent with homemaking with ambulation Driving: Yes Vocation: Retired Leisure: Hobbies-yes (Comment) Comments: Teaches water aerobics, does all house and yard work, enjoys cooking   Assessment ADL/Vision/Perception ADL ADL Comments: Difficulty using right arm with any activity above waist height.  Unable to sleep comfortably. Pain with reach forward or pulling back motions Dominant Hand: Right  Cognition/Observation Cognition Orientation Level: Oriented X4 Observation/Other Assessments Observations: Positive Drop Arm Test, Painful Arc positive at 90  Sensation/Coordination/Edema Sensation Light Touch: Appears Intact Coordination Gross Motor Movements are Fluid and Coordinated: Yes Fine Motor Movements are Fluid and Coordinated: Yes  Additional Assessments LUE AROM (degrees) LUE Overall AROM Comments: Assessed in seated ER/IR with shoulder abducted Left Shoulder Flexion: 145 Degrees Left Shoulder ABduction: 130 Degrees Left Shoulder Internal Rotation: 45 Degrees Left Shoulder External Rotation: 90 Degrees LUE Strength Left Shoulder Flexion: 4/5 Left Shoulder ABduction: 4/5 Left Shoulder Internal Rotation: 4/5 Left Shoulder External Rotation: 4/5 Palpation Palpation: Mod-max fascial restrictions in left upper arm and scapular region     Exercise/Treatments     Modalities Modalities: Electrical Stimulation Manual Therapy Manual Therapy: Myofascial release Myofascial Release: MFR and manual stretching to left upper arm and scapular region with PROM to Left shoulder.  [id number]  Electrical Stimulation Electrical Stimulation Location: IFES to left shoulder with heat.  Electrical Stimulation Action: hi/low Radiation protection practitioner Parameters: 11.0 Electrical Stimulation Goals: Pain  Occupational Therapy Assessment and Plan OT Assessment and Plan Clinical Impression Statement: A:  Patient with increased pain and  restrictions and decreased AROM and strength in her left shoulder causing  decreased I with B/IADLs and leisure activities. Pt will benefit from skilled therapeutic intervention in order to improve on the following deficits: Decreased range of motion;Decreased strength;Increased fascial restricitons;Increased muscle spasms;Pain Rehab Potential: Excellent OT Frequency: Min 2X/week OT Duration: 8 weeks OT Treatment/Interventions: Self-care/ADL training;Therapeutic exercise;Manual therapy;Therapeutic activities;Patient/family education;Modalities OT Plan: P:  Skilled OT intervention to increase AROM and strength and decrease pain and restrictions in her left shoulder region in order to return to full use of LUE.  Treatment Plan:  MFR in supine., PROM progressing to [facility].  isometrics in supine.  seated ext, row,elev.  ball stretches.  progress as tolerated.  IFES with heat as needed.   Goals Short Term Goals Time to Complete Short Term Goals: 4 weeks Short Term Goal 1: Patient will be educated on HEP. Short Term Goal 2: Patient will increase PROM to [facility] for increased ability to reach into overhead cabinets. Short Term Goal 3: Patient will increase left shoulder strength to 4+/5 for increased ability to lift yard tools. Short Term Goal 4: Patient will decrease fascial restrictions to moderate in her left shoulder. Short Term Goal 5: Patient will decrease pain to 5/10 while completing water aerobics. Long Term Goals Time to Complete Long Term Goals: 8 weeks Long Term Goal 1: Patient will return to prior level of I with all B/IADLs and leisure activities. Long Term Goal 2: Patient will increase left shoulder AROM to WNL for increased ability reach forward and backward. Long Term Goal 3: Patient will increase left shoulder strength to 5/5 for increased ability to lift pots and pans. Long Term Goal 4: Patient will decrease pain in left shoulder to 2/10 with daily activities. Long Term Goal 5: Patient  will decrease fascial restrictions to minimal in her left shoulder.  Problem List Patient Active Problem List  Diagnosis  . Pain in joint, shoulder region  . Muscle weakness (generalized)  . Impingement syndrome of left shoulder    End of Session Activity Tolerance: Patient tolerated treatment well General Behavior During Session: [facility] for tasks performed Cognition: [facility] for tasks performed OT Plan of Care OT Home Exercise Plan: cervical stretches and towel slides.  Consulted and Agree with Plan of Care: Patient  GO Functional Assessment Tool Used: UEFI scored 18% I level, 82% disability level Functional Limitation: Carrying, moving and handling objects Carrying, Moving and Handling Objects Current Status [id number]): At least 80 percent but less than 100 percent impaired, limited or restricted Carrying, Moving and Handling Objects Goal Status [phone]): At least 1 percent but less than 20 percent impaired, limited or restricted  Vangie Bicker, OTR/L  08/09/2012, 5:34 PM  Physician Documentation Your signature is required to indicate approval of the treatment plan as stated above.  Please sign and either send electronically or make a copy of this report for your files and return this physician signed original.  Please mark one 1.__approve of plan  2. ___approve of plan with the following conditions.   ______________________________                                                          _____________________ Physician Signature  Date  

## 2012-08-13 ENCOUNTER — Ambulatory Visit (HOSPITAL_COMMUNITY)
Admission: RE | Admit: 2012-08-13 | Discharge: 2012-08-13 | Disposition: A | Discharge status: Home / Self Care | Payer: Medicare Other | Source: Ambulatory Visit | Attending: Internal Medicine | Admitting: Internal Medicine

## 2012-08-13 DIAGNOSIS — IMO0001 Reserved for inherently not codable concepts without codable children: Secondary | ICD-10-CM | POA: Diagnosis not present

## 2012-08-13 DIAGNOSIS — M6281 Muscle weakness (generalized): Secondary | ICD-10-CM | POA: Diagnosis not present

## 2012-08-13 DIAGNOSIS — M25519 Pain in unspecified shoulder: Secondary | ICD-10-CM | POA: Diagnosis not present

## 2012-08-13 DIAGNOSIS — M7542 Impingement syndrome of left shoulder: Secondary | ICD-10-CM

## 2012-08-13 NOTE — Progress Notes (Signed)
Occupational Therapy Treatment Patient Details  Name: KLOEE WIEGMAN MRN: [id number] Date of Birth: 01/31/1941  Today's Date: 08/13/2012 Time: [id number] OT Time Calculation (min): 67 min Manual Therapy [phone] 20' Therapeutic Exercise [phone]' IFES with [place] [phone] 15'  Visit#: 2  of 16   Re-eval: 09/06/12    Authorization: Medicare  Authorization Time Period: before 10th visit  Authorization Visit#: 2  of 10   Subjective Symptoms/Limitations Symptoms: S: Beth, it is worse now, I am not sure that I am doing the exercises correctly.  It hurts when I wash my hands.  Pain Assessment Currently in Pain?: Yes (pt. unable to give # just felt it was worse.)      Exercise/Treatments Supine Protraction: PROM;10 reps Horizontal ABduction: PROM;10 reps External Rotation: PROM;10 reps Internal Rotation: PROM;10 reps Flexion: PROM;10 reps ABduction: PROM;10 reps Other Supine Exercises: bridges x 15 Seated Elevation: AROM;10 reps Extension: AROM;10 reps Row: AROM;10 reps Therapy Ball Flexion: 15 reps ABduction: 15 reps ROM / Strengthening / Isometric Strengthening   Flexion: 3X3" Extension: 3X3" External Rotation: 3X3" Internal Rotation: 3X3" ABduction: 3X3" ADduction: 3X3"       Modalities Modalities: Electrical Stimulation;Moist Heat Manual Therapy Manual Therapy: Myofascial release Myofascial Release: MFR and manual stretching to left upper arm, trapezius, scapular, SCM, and shoulder region to decrease pain and restrictions and increase pain free mobility. [id number] completed by Hazeline Junker, OTR/L taken at 08/13/12 1000 by Arbutus Ped, OTR Moist Heat Therapy Number Minutes Moist Heat: 15 Minutes Moist Heat Location: Shoulder Electrical Stimulation Electrical Stimulation Location: IFES to left shoulder Electrical Stimulation Action: hi/low sweep Electrical Stimulation Parameters: 9.5 Electrical Stimulation Goals: Pain  Occupational Therapy  Assessment and Plan OT Assessment and Plan Clinical Impression Statement: A:  Added isometrics, bridges supine and ball stretches and scapular ROM seated.  Cues to keep shoulder retracted and depressed with stretches and general posture.  Pain decreased after IFES and MH. OT Plan: P:  Attempt to increase PROM by 5 degrees if pain allows.  Increase hold for isometrics   Goals Short Term Goals Time to Complete Short Term Goals: 4 weeks Short Term Goal 1: Patient will be educated on HEP. Short Term Goal 2: Patient will increase PROM to [facility] for increased ability to reach into overhead cabinets. Short Term Goal 3: Patient will increase left shoulder strength to 4+/5 for increased ability to lift yard tools. Short Term Goal 4: Patient will decrease fascial restrictions to moderate in her left shoulder. Short Term Goal 5: Patient will decrease pain to 5/10 while completing water aerobics. Long Term Goals Time to Complete Long Term Goals: 8 weeks Long Term Goal 1: Patient will return to prior level of I with all B/IADLs and leisure activities. Long Term Goal 2: Patient will increase left shoulder AROM to WNL for increased ability reach forward and backward. Long Term Goal 3: Patient will increase left shoulder strength to 5/5 for increased ability to lift pots and pans. Long Term Goal 4: Patient will decrease pain in left shoulder to 2/10 with daily activities. Long Term Goal 5: Patient will decrease fascial restrictions to minimal in her left shoulder.  Problem List Patient Active Problem List  Diagnosis  . Pain in joint, shoulder region  . Muscle weakness (generalized)  . Impingement syndrome of left shoulder    End of Session Activity Tolerance: Patient tolerated treatment well General Behavior During Session: [facility] for tasks performed Cognition: [facility] for tasks performed  Maikayla Beggs L. Carlin Attridge, COTA/L  08/13/2012, 2:37  PM

## 2012-08-17 ENCOUNTER — Ambulatory Visit (HOSPITAL_COMMUNITY)
Admission: RE | Admit: 2012-08-17 | Discharge: 2012-08-17 | Disposition: A | Discharge status: Home / Self Care | Payer: Medicare Other | Source: Ambulatory Visit | Attending: Internal Medicine | Admitting: Internal Medicine

## 2012-08-17 DIAGNOSIS — M7542 Impingement syndrome of left shoulder: Secondary | ICD-10-CM

## 2012-08-17 DIAGNOSIS — M25519 Pain in unspecified shoulder: Secondary | ICD-10-CM | POA: Diagnosis not present

## 2012-08-17 DIAGNOSIS — IMO0001 Reserved for inherently not codable concepts without codable children: Secondary | ICD-10-CM | POA: Diagnosis not present

## 2012-08-17 DIAGNOSIS — M6281 Muscle weakness (generalized): Secondary | ICD-10-CM | POA: Diagnosis not present

## 2012-08-17 NOTE — Progress Notes (Signed)
Occupational Therapy Treatment Patient Details  Name: [name] MRN: [id number] Date of Birth: [birth year]  Today's Date: 08/17/2012 Time: [id number] OT Time Calculation (min): 65 min Manual Therapy: [id number] 24' Therapeutic Exercises: [id number] 24' IFES with cold:     Visit#: 3  of 16   Re-eval: 09/06/12    Authorization: Medicare   Authorization Time Period: before 10th visit   Authorization Visit#: 3  of 10   Subjective Symptoms/Limitations Symptoms: S: I was in so much pain after Friday. I had deep deep tissue massage and I could not even go to Sunday school or anything.  Pain Assessment Currently in Pain?: Yes Pain Score:   7 (pain has gotten better this morning from the weekend) Pain Location: Shoulder Pain Type: Acute pain      Exercise/Treatments Supine Protraction: PROM;10 reps Horizontal ABduction: PROM;10 reps External Rotation: PROM;10 reps Internal Rotation: PROM;10 reps Flexion: PROM;10 reps ABduction: PROM;10 reps Other Supine Exercises: resume Seated Elevation: AROM;15 reps Extension: AROM;15 reps Row: AROM;15 reps Therapy Ball Flexion: Other (comment) (pt requested to stop after 8 reps) Flexion Limitations: pt requested to stop due to pain after 8 reps ABduction: 15 reps ROM / Strengthening / Isometric Strengthening   Flexion: Other (comment) (resume next session)        Modalities Modalities: Electrical Stimulation (Ice) Manual Therapy Manual Therapy: Myofascial release Myofascial Release: MFR and manual stretching to left upper arm, trapezius, scapular, SCM, and shoulder region to decrease pain and restrictions and increase pain free mobility. Moist Heat Therapy Number Minutes Moist Heat: 15 Minutes Moist Heat Location: Shoulder Electrical Stimulation Electrical Stimulation Location: IFES to left shoulder Electrical Stimulation Action: hi/low sweep  Electrical Stimulation Parameters: 9.0 Electrical Stimulation Goals:  Pain  Occupational Therapy Assessment and Plan OT Assessment and Plan Clinical Impression Statement: A: A: Pt was in a lot of pain at the beginning of session. Pt stated that she wanted very light pressure. Pt completed 8 flexion stretches on therapy ball due to pain. Pt did complete 15 reps with abduction with proper form. Pt completed other seated exercises; stated that rowing did cause pain on the last rep.  Patient's pain greatly decreased after manual and IFES with ice.  Pain a 1 or 2/10. Rehab Potential: Excellent OT Plan: P: Resume isometrics and increase hold time if pain allows. Increase reps on therapy ball for flexion and abduction with proper form. Increase PROM by 5 degrees if pain allows.    Goals Short Term Goals Time to Complete Short Term Goals: 4 weeks Short Term Goal 1: Patient will be educated on HEP. Short Term Goal 2: Patient will increase PROM to [facility] for increased ability to reach into overhead cabinets. Short Term Goal 3: Patient will increase left shoulder strength to 4+/5 for increased ability to lift yard tools. Short Term Goal 4: Patient will decrease fascial restrictions to moderate in her left shoulder. Short Term Goal 5: Patient will decrease pain to 5/10 while completing water aerobics. Long Term Goals Time to Complete Long Term Goals: 8 weeks Long Term Goal 1: Patient will return to prior level of I with all B/IADLs and leisure activities. Long Term Goal 2: Patient will increase left shoulder AROM to WNL for increased ability reach forward and backward. Long Term Goal 3: Patient will increase left shoulder strength to 5/5 for increased ability to lift pots and pans. Long Term Goal 4: Patient will decrease pain in left shoulder to 2/10 with daily activities. Long Term Goal 5: Patient will  decrease fascial restrictions to minimal in her left shoulder.  Problem List Patient Active Problem List  Diagnosis  . Pain in joint, shoulder region  . Muscle weakness  (generalized)  . Impingement syndrome of left shoulder    End of Session Activity Tolerance: Patient tolerated treatment well General Behavior During Session: [facility] for tasks performed Cognition: [facility] for tasks performed  GO   Dulce Martian L. Giulliana Mcroberts, COTA/L  08/17/2012, 12:08 PM

## 2012-08-19 DIAGNOSIS — H5200 Hypermetropia, unspecified eye: Secondary | ICD-10-CM | POA: Diagnosis not present

## 2012-08-19 DIAGNOSIS — H52229 Regular astigmatism, unspecified eye: Secondary | ICD-10-CM | POA: Diagnosis not present

## 2012-08-19 DIAGNOSIS — H524 Presbyopia: Secondary | ICD-10-CM | POA: Diagnosis not present

## 2012-08-19 DIAGNOSIS — H43819 Vitreous degeneration, unspecified eye: Secondary | ICD-10-CM | POA: Diagnosis not present

## 2012-08-20 ENCOUNTER — Ambulatory Visit (HOSPITAL_COMMUNITY)
Admission: RE | Admit: 2012-08-20 | Discharge: 2012-08-20 | Disposition: A | Discharge status: Home / Self Care | Payer: Medicare Other | Source: Ambulatory Visit | Attending: Internal Medicine | Admitting: Internal Medicine

## 2012-08-20 DIAGNOSIS — E538 Deficiency of other specified B group vitamins: Secondary | ICD-10-CM | POA: Diagnosis not present

## 2012-08-20 NOTE — Progress Notes (Signed)
  Patient Details  Name: [name] MRN: [id number] Date of Birth: [birth year] ** No charge visit** Today's Date: 08/20/2012 [name] arrived for her appointment today stating that she felt that therapy was causing more pain than relief in her shoulder.  She finds the greatest relief from pain is resting her shoulder.  Her MD had previously recommended that she have an MRI, and she had declined doing so.  She now feels that she would like to have an MRI before continuing therapy to see what may be causing her pain.  We will hold her therapy for now.  I will follow up with her in 2 weeks to determine if therapy should be resumed, held, or discontinued.   Vangie Bicker, OTR/L  08/20/2012, 9:59 AM

## 2012-08-24 ENCOUNTER — Ambulatory Visit (HOSPITAL_COMMUNITY): Payer: Medicare Other | Admitting: Occupational Therapy

## 2012-08-27 ENCOUNTER — Ambulatory Visit (HOSPITAL_COMMUNITY): Payer: Medicare Other | Admitting: Specialist

## 2012-08-30 DIAGNOSIS — M25519 Pain in unspecified shoulder: Secondary | ICD-10-CM | POA: Diagnosis not present

## 2012-08-31 ENCOUNTER — Other Ambulatory Visit (HOSPITAL_COMMUNITY): Payer: Self-pay | Admitting: Orthopedic Surgery

## 2012-08-31 ENCOUNTER — Ambulatory Visit (HOSPITAL_COMMUNITY): Payer: Medicare Other | Admitting: Occupational Therapy

## 2012-08-31 DIAGNOSIS — M25519 Pain in unspecified shoulder: Secondary | ICD-10-CM

## 2012-09-02 ENCOUNTER — Ambulatory Visit (HOSPITAL_COMMUNITY)
Admission: RE | Admit: 2012-09-02 | Discharge: 2012-09-02 | Disposition: A | Discharge status: Home / Self Care | Payer: Medicare Other | Source: Ambulatory Visit | Attending: Orthopedic Surgery | Admitting: Orthopedic Surgery

## 2012-09-02 DIAGNOSIS — M719 Bursopathy, unspecified: Secondary | ICD-10-CM | POA: Diagnosis not present

## 2012-09-02 DIAGNOSIS — M25519 Pain in unspecified shoulder: Secondary | ICD-10-CM | POA: Diagnosis not present

## 2012-09-02 DIAGNOSIS — M67919 Unspecified disorder of synovium and tendon, unspecified shoulder: Secondary | ICD-10-CM | POA: Diagnosis not present

## 2012-09-02 DIAGNOSIS — M249 Joint derangement, unspecified: Secondary | ICD-10-CM | POA: Diagnosis not present

## 2012-09-03 ENCOUNTER — Ambulatory Visit (HOSPITAL_COMMUNITY): Payer: Medicare Other | Admitting: Occupational Therapy

## 2012-09-13 DIAGNOSIS — M25519 Pain in unspecified shoulder: Secondary | ICD-10-CM | POA: Diagnosis not present

## 2012-09-23 DIAGNOSIS — M67919 Unspecified disorder of synovium and tendon, unspecified shoulder: Secondary | ICD-10-CM | POA: Diagnosis not present

## 2012-09-23 DIAGNOSIS — M719 Bursopathy, unspecified: Secondary | ICD-10-CM | POA: Diagnosis not present

## 2012-09-27 DIAGNOSIS — E538 Deficiency of other specified B group vitamins: Secondary | ICD-10-CM | POA: Diagnosis not present

## 2012-10-12 DIAGNOSIS — M9420 Chondromalacia, unspecified site: Secondary | ICD-10-CM | POA: Diagnosis not present

## 2012-10-12 DIAGNOSIS — M24119 Other articular cartilage disorders, unspecified shoulder: Secondary | ICD-10-CM | POA: Diagnosis not present

## 2012-10-12 DIAGNOSIS — S46819A Strain of other muscles, fascia and tendons at shoulder and upper arm level, unspecified arm, initial encounter: Secondary | ICD-10-CM | POA: Diagnosis not present

## 2012-10-12 DIAGNOSIS — M25819 Other specified joint disorders, unspecified shoulder: Secondary | ICD-10-CM | POA: Diagnosis not present

## 2012-10-12 DIAGNOSIS — M7520 Bicipital tendinitis, unspecified shoulder: Secondary | ICD-10-CM | POA: Diagnosis not present

## 2012-10-12 DIAGNOSIS — M719 Bursopathy, unspecified: Secondary | ICD-10-CM | POA: Diagnosis not present

## 2012-10-12 DIAGNOSIS — M19019 Primary osteoarthritis, unspecified shoulder: Secondary | ICD-10-CM | POA: Diagnosis not present

## 2012-10-12 DIAGNOSIS — M67919 Unspecified disorder of synovium and tendon, unspecified shoulder: Secondary | ICD-10-CM | POA: Diagnosis not present

## 2012-10-12 DIAGNOSIS — S43499A Other sprain of unspecified shoulder joint, initial encounter: Secondary | ICD-10-CM | POA: Diagnosis not present

## 2012-10-18 DIAGNOSIS — M25519 Pain in unspecified shoulder: Secondary | ICD-10-CM | POA: Diagnosis not present

## 2012-10-18 DIAGNOSIS — M24119 Other articular cartilage disorders, unspecified shoulder: Secondary | ICD-10-CM | POA: Diagnosis not present

## 2012-10-20 DIAGNOSIS — Z4789 Encounter for other orthopedic aftercare: Secondary | ICD-10-CM | POA: Diagnosis not present

## 2012-10-20 DIAGNOSIS — M25519 Pain in unspecified shoulder: Secondary | ICD-10-CM | POA: Diagnosis not present

## 2012-10-21 DIAGNOSIS — F411 Generalized anxiety disorder: Secondary | ICD-10-CM | POA: Diagnosis not present

## 2012-10-25 DIAGNOSIS — M25519 Pain in unspecified shoulder: Secondary | ICD-10-CM | POA: Diagnosis not present

## 2012-10-27 DIAGNOSIS — M24119 Other articular cartilage disorders, unspecified shoulder: Secondary | ICD-10-CM | POA: Diagnosis not present

## 2012-11-01 DIAGNOSIS — E538 Deficiency of other specified B group vitamins: Secondary | ICD-10-CM | POA: Diagnosis not present

## 2012-11-01 DIAGNOSIS — M24119 Other articular cartilage disorders, unspecified shoulder: Secondary | ICD-10-CM | POA: Diagnosis not present

## 2012-11-03 DIAGNOSIS — M24119 Other articular cartilage disorders, unspecified shoulder: Secondary | ICD-10-CM | POA: Diagnosis not present

## 2012-11-08 DIAGNOSIS — M24119 Other articular cartilage disorders, unspecified shoulder: Secondary | ICD-10-CM | POA: Diagnosis not present

## 2012-11-10 DIAGNOSIS — M24119 Other articular cartilage disorders, unspecified shoulder: Secondary | ICD-10-CM | POA: Diagnosis not present

## 2012-11-15 DIAGNOSIS — M24119 Other articular cartilage disorders, unspecified shoulder: Secondary | ICD-10-CM | POA: Diagnosis not present

## 2012-11-17 DIAGNOSIS — M24119 Other articular cartilage disorders, unspecified shoulder: Secondary | ICD-10-CM | POA: Diagnosis not present

## 2012-11-26 DIAGNOSIS — M24119 Other articular cartilage disorders, unspecified shoulder: Secondary | ICD-10-CM | POA: Diagnosis not present

## 2012-11-30 DIAGNOSIS — M24119 Other articular cartilage disorders, unspecified shoulder: Secondary | ICD-10-CM | POA: Diagnosis not present

## 2012-12-03 DIAGNOSIS — M24119 Other articular cartilage disorders, unspecified shoulder: Secondary | ICD-10-CM | POA: Diagnosis not present

## 2012-12-03 DIAGNOSIS — E538 Deficiency of other specified B group vitamins: Secondary | ICD-10-CM | POA: Diagnosis not present

## 2012-12-06 DIAGNOSIS — M24119 Other articular cartilage disorders, unspecified shoulder: Secondary | ICD-10-CM | POA: Diagnosis not present

## 2012-12-10 DIAGNOSIS — M24119 Other articular cartilage disorders, unspecified shoulder: Secondary | ICD-10-CM | POA: Diagnosis not present

## 2012-12-13 DIAGNOSIS — M24119 Other articular cartilage disorders, unspecified shoulder: Secondary | ICD-10-CM | POA: Diagnosis not present

## 2012-12-17 DIAGNOSIS — M24119 Other articular cartilage disorders, unspecified shoulder: Secondary | ICD-10-CM | POA: Diagnosis not present

## 2012-12-24 DIAGNOSIS — M25519 Pain in unspecified shoulder: Secondary | ICD-10-CM | POA: Diagnosis not present

## 2012-12-31 DIAGNOSIS — M25519 Pain in unspecified shoulder: Secondary | ICD-10-CM | POA: Diagnosis not present

## 2013-01-04 DIAGNOSIS — E538 Deficiency of other specified B group vitamins: Secondary | ICD-10-CM | POA: Diagnosis not present

## 2013-01-04 DIAGNOSIS — M25519 Pain in unspecified shoulder: Secondary | ICD-10-CM | POA: Diagnosis not present

## 2013-01-19 DIAGNOSIS — F411 Generalized anxiety disorder: Secondary | ICD-10-CM | POA: Diagnosis not present

## 2013-01-19 DIAGNOSIS — Z4789 Encounter for other orthopedic aftercare: Secondary | ICD-10-CM | POA: Diagnosis not present

## 2013-02-03 DIAGNOSIS — E538 Deficiency of other specified B group vitamins: Secondary | ICD-10-CM | POA: Diagnosis not present

## 2013-02-21 DIAGNOSIS — Z4789 Encounter for other orthopedic aftercare: Secondary | ICD-10-CM | POA: Diagnosis not present

## 2013-03-07 DIAGNOSIS — E538 Deficiency of other specified B group vitamins: Secondary | ICD-10-CM | POA: Diagnosis not present

## 2013-04-11 ENCOUNTER — Other Ambulatory Visit (HOSPITAL_COMMUNITY): Payer: Self-pay | Admitting: Internal Medicine

## 2013-04-11 DIAGNOSIS — Z139 Encounter for screening, unspecified: Secondary | ICD-10-CM

## 2013-04-13 ENCOUNTER — Ambulatory Visit (HOSPITAL_COMMUNITY): Payer: Medicare Other

## 2013-04-14 ENCOUNTER — Ambulatory Visit (HOSPITAL_COMMUNITY)
Admission: RE | Admit: 2013-04-14 | Discharge: 2013-04-14 | Disposition: A | Discharge status: Home / Self Care | Payer: Medicare Other | Source: Ambulatory Visit | Attending: Internal Medicine | Admitting: Internal Medicine

## 2013-04-14 DIAGNOSIS — Z1231 Encounter for screening mammogram for malignant neoplasm of breast: Secondary | ICD-10-CM | POA: Diagnosis not present

## 2013-04-14 DIAGNOSIS — E538 Deficiency of other specified B group vitamins: Secondary | ICD-10-CM | POA: Diagnosis not present

## 2013-04-14 DIAGNOSIS — Z139 Encounter for screening, unspecified: Secondary | ICD-10-CM

## 2013-05-06 DIAGNOSIS — M899 Disorder of bone, unspecified: Secondary | ICD-10-CM | POA: Diagnosis not present

## 2013-05-06 DIAGNOSIS — Z79899 Other long term (current) drug therapy: Secondary | ICD-10-CM | POA: Diagnosis not present

## 2013-05-06 DIAGNOSIS — E039 Hypothyroidism, unspecified: Secondary | ICD-10-CM | POA: Diagnosis not present

## 2013-05-06 DIAGNOSIS — E569 Vitamin deficiency, unspecified: Secondary | ICD-10-CM | POA: Diagnosis not present

## 2013-05-16 DIAGNOSIS — E538 Deficiency of other specified B group vitamins: Secondary | ICD-10-CM | POA: Diagnosis not present

## 2013-05-16 DIAGNOSIS — M899 Disorder of bone, unspecified: Secondary | ICD-10-CM | POA: Diagnosis not present

## 2013-05-16 DIAGNOSIS — Z79899 Other long term (current) drug therapy: Secondary | ICD-10-CM | POA: Diagnosis not present

## 2013-05-16 DIAGNOSIS — E039 Hypothyroidism, unspecified: Secondary | ICD-10-CM | POA: Diagnosis not present

## 2013-05-16 DIAGNOSIS — E569 Vitamin deficiency, unspecified: Secondary | ICD-10-CM | POA: Diagnosis not present

## 2013-05-24 DIAGNOSIS — Z1212 Encounter for screening for malignant neoplasm of rectum: Secondary | ICD-10-CM | POA: Diagnosis not present

## 2013-05-24 DIAGNOSIS — F411 Generalized anxiety disorder: Secondary | ICD-10-CM | POA: Diagnosis not present

## 2013-05-24 DIAGNOSIS — K520 Gastroenteritis and colitis due to radiation: Secondary | ICD-10-CM | POA: Diagnosis not present

## 2013-05-24 DIAGNOSIS — M810 Age-related osteoporosis without current pathological fracture: Secondary | ICD-10-CM | POA: Diagnosis not present

## 2013-06-17 DIAGNOSIS — E538 Deficiency of other specified B group vitamins: Secondary | ICD-10-CM | POA: Diagnosis not present

## 2013-07-19 DIAGNOSIS — E538 Deficiency of other specified B group vitamins: Secondary | ICD-10-CM | POA: Diagnosis not present

## 2013-08-22 DIAGNOSIS — E538 Deficiency of other specified B group vitamins: Secondary | ICD-10-CM | POA: Diagnosis not present

## 2013-08-25 DIAGNOSIS — H524 Presbyopia: Secondary | ICD-10-CM | POA: Diagnosis not present

## 2013-08-25 DIAGNOSIS — H5200 Hypermetropia, unspecified eye: Secondary | ICD-10-CM | POA: Diagnosis not present

## 2013-08-25 DIAGNOSIS — H52229 Regular astigmatism, unspecified eye: Secondary | ICD-10-CM | POA: Diagnosis not present

## 2013-08-25 DIAGNOSIS — H04129 Dry eye syndrome of unspecified lacrimal gland: Secondary | ICD-10-CM | POA: Diagnosis not present

## 2013-08-29 DIAGNOSIS — F411 Generalized anxiety disorder: Secondary | ICD-10-CM | POA: Diagnosis not present

## 2013-09-22 DIAGNOSIS — E538 Deficiency of other specified B group vitamins: Secondary | ICD-10-CM | POA: Diagnosis not present

## 2013-10-24 DIAGNOSIS — E538 Deficiency of other specified B group vitamins: Secondary | ICD-10-CM | POA: Diagnosis not present

## 2013-11-29 DIAGNOSIS — E538 Deficiency of other specified B group vitamins: Secondary | ICD-10-CM | POA: Diagnosis not present

## 2014-01-02 DIAGNOSIS — E538 Deficiency of other specified B group vitamins: Secondary | ICD-10-CM | POA: Diagnosis not present

## 2014-01-03 ENCOUNTER — Other Ambulatory Visit (HOSPITAL_COMMUNITY): Payer: Self-pay | Admitting: Internal Medicine

## 2014-01-03 DIAGNOSIS — M810 Age-related osteoporosis without current pathological fracture: Secondary | ICD-10-CM

## 2014-01-16 ENCOUNTER — Other Ambulatory Visit (HOSPITAL_COMMUNITY): Payer: Medicare Other

## 2014-01-18 ENCOUNTER — Ambulatory Visit (HOSPITAL_COMMUNITY)
Admission: RE | Admit: 2014-01-18 | Discharge: 2014-01-18 | Disposition: A | Discharge status: Home / Self Care | Payer: Medicare Other | Source: Ambulatory Visit | Attending: Internal Medicine | Admitting: Internal Medicine

## 2014-01-18 DIAGNOSIS — M810 Age-related osteoporosis without current pathological fracture: Secondary | ICD-10-CM | POA: Insufficient documentation

## 2014-01-18 DIAGNOSIS — Z780 Asymptomatic menopausal state: Secondary | ICD-10-CM | POA: Diagnosis not present

## 2014-01-31 DIAGNOSIS — E538 Deficiency of other specified B group vitamins: Secondary | ICD-10-CM | POA: Diagnosis not present

## 2014-02-02 ENCOUNTER — Encounter (HOSPITAL_COMMUNITY): Payer: Self-pay

## 2014-02-02 ENCOUNTER — Encounter (HOSPITAL_COMMUNITY)
Admission: RE | Admit: 2014-02-02 | Discharge: 2014-02-02 | Disposition: A | Discharge status: Home / Self Care | Payer: Medicare Other | Source: Ambulatory Visit | Attending: Internal Medicine | Admitting: Internal Medicine

## 2014-02-02 DIAGNOSIS — M810 Age-related osteoporosis without current pathological fracture: Secondary | ICD-10-CM | POA: Diagnosis not present

## 2014-02-02 HISTORY — DX: Age-related osteoporosis without current pathological fracture: M81.0

## 2014-02-02 LAB — COMPREHENSIVE METABOLIC PANEL
ALT: 17 U/L (ref 0–35)
AST: 26 U/L (ref 0–37)
Albumin: 3.9 g/dL (ref 3.5–5.2)
Alkaline Phosphatase: 101 U/L (ref 39–117)
BUN: 12 mg/dL (ref 6–23)
CO2: 27 mEq/L (ref 19–32)
Calcium: 9.6 mg/dL (ref 8.4–10.5)
Chloride: 103 mEq/L (ref 96–112)
Creatinine, Ser: 0.82 mg/dL (ref 0.50–1.10)
GFR calc Af Amer: 81 mL/min — ABNORMAL LOW (ref >90)
GFR calc non Af Amer: 70 mL/min — ABNORMAL LOW (ref >90)
Glucose, Bld: 82 mg/dL (ref 70–99)
Potassium: 3.9 mEq/L (ref 3.7–5.3)
Sodium: 141 mEq/L (ref 137–147)
Total Bilirubin: 0.4 mg/dL (ref 0.3–1.2)
Total Protein: 7.2 g/dL (ref 6.0–8.3)

## 2014-02-02 LAB — MAGNESIUM: Magnesium: 1.9 mg/dL (ref 1.5–2.5)

## 2014-02-02 LAB — PHOSPHORUS: Phosphorus: 4.1 mg/dL (ref 2.3–4.6)

## 2014-02-02 MED ORDER — ZOLEDRONIC ACID 5 MG/100ML IV SOLN
5.0000 mg | Freq: Once | INTRAVENOUS | Status: AC
  Administered 2014-02-02: 5 mg via INTRAVENOUS
  Filled 2014-02-02: qty 100

## 2014-02-02 MED ORDER — SODIUM CHLORIDE 0.9 % IV SOLN
Freq: Once | INTRAVENOUS | Status: AC
  Administered 2014-02-02: 250 mL via INTRAVENOUS

## 2014-02-02 NOTE — Progress Notes (Signed)
Results for [name], [name] (MRN [id number]) as of 02/02/2014 07:54  Labs prior to reclast infusion. Tolerated well.   Ref. Range 01/18/2014 10:36 02/02/2014 07:15  Sodium Latest Range: 137-147 mEq/L  141  Potassium Latest Range: 3.7-5.3 mEq/L  3.9  Chloride Latest Range: 96-112 mEq/L  103  CO2 Latest Range: 19-32 mEq/L  27  BUN Latest Range: 6-23 mg/dL  12  Creatinine Latest Range: 0.50-1.10 mg/dL  0.82  Calcium Latest Range: 8.4-10.5 mg/dL  9.6  GFR calc non Af Amer Latest Range: >90 mL/min  70 (L)  GFR calc Af Amer Latest Range: >90 mL/min  81 (L)  Glucose Latest Range: 70-99 mg/dL  82  Phosphorus Latest Range: 2.3-4.6 mg/dL  4.1  Magnesium Latest Range: 1.5-2.5 mg/dL  1.9  Alkaline Phosphatase Latest Range: 39-117 U/L  101  Albumin Latest Range: 3.5-5.2 g/dL  3.9  AST Latest Range: 0-37 U/L  26  ALT Latest Range: 0-35 U/L  17  Total Protein Latest Range: 6.0-8.3 g/dL  7.2  Total Bilirubin Latest Range: 0.3-1.2 mg/dL  0.4

## 2014-02-02 NOTE — Discharge Instructions (Signed)

## 2014-03-06 DIAGNOSIS — E538 Deficiency of other specified B group vitamins: Secondary | ICD-10-CM | POA: Diagnosis not present

## 2014-03-16 DIAGNOSIS — H1130 Conjunctival hemorrhage, unspecified eye: Secondary | ICD-10-CM | POA: Diagnosis not present

## 2014-04-07 DIAGNOSIS — E538 Deficiency of other specified B group vitamins: Secondary | ICD-10-CM | POA: Diagnosis not present

## 2014-05-08 ENCOUNTER — Other Ambulatory Visit (HOSPITAL_COMMUNITY): Payer: Self-pay | Admitting: Internal Medicine

## 2014-05-08 DIAGNOSIS — Z1231 Encounter for screening mammogram for malignant neoplasm of breast: Secondary | ICD-10-CM

## 2014-05-08 DIAGNOSIS — Z139 Encounter for screening, unspecified: Secondary | ICD-10-CM

## 2014-05-12 ENCOUNTER — Ambulatory Visit (HOSPITAL_COMMUNITY)
Admission: RE | Admit: 2014-05-12 | Discharge: 2014-05-12 | Disposition: A | Discharge status: Home / Self Care | Payer: Medicare Other | Source: Ambulatory Visit | Attending: Internal Medicine | Admitting: Internal Medicine

## 2014-05-12 DIAGNOSIS — Z1231 Encounter for screening mammogram for malignant neoplasm of breast: Secondary | ICD-10-CM | POA: Diagnosis not present

## 2014-05-12 DIAGNOSIS — R928 Other abnormal and inconclusive findings on diagnostic imaging of breast: Secondary | ICD-10-CM | POA: Diagnosis not present

## 2014-05-12 DIAGNOSIS — E538 Deficiency of other specified B group vitamins: Secondary | ICD-10-CM | POA: Diagnosis not present

## 2014-05-16 ENCOUNTER — Other Ambulatory Visit: Payer: Self-pay | Admitting: Internal Medicine

## 2014-05-16 DIAGNOSIS — R928 Other abnormal and inconclusive findings on diagnostic imaging of breast: Secondary | ICD-10-CM

## 2014-05-17 ENCOUNTER — Other Ambulatory Visit: Payer: Self-pay | Admitting: Internal Medicine

## 2014-05-17 DIAGNOSIS — R928 Other abnormal and inconclusive findings on diagnostic imaging of breast: Secondary | ICD-10-CM

## 2014-05-25 ENCOUNTER — Ambulatory Visit
Admission: RE | Admit: 2014-05-25 | Discharge: 2014-05-25 | Disposition: A | Discharge status: Home / Self Care | Payer: Medicare Other | Source: Ambulatory Visit | Attending: Internal Medicine | Admitting: Internal Medicine

## 2014-05-25 ENCOUNTER — Encounter (INDEPENDENT_AMBULATORY_CARE_PROVIDER_SITE_OTHER): Payer: Self-pay

## 2014-05-25 DIAGNOSIS — R928 Other abnormal and inconclusive findings on diagnostic imaging of breast: Secondary | ICD-10-CM

## 2014-05-25 DIAGNOSIS — R922 Inconclusive mammogram: Secondary | ICD-10-CM | POA: Diagnosis not present

## 2014-05-25 DIAGNOSIS — N6459 Other signs and symptoms in breast: Secondary | ICD-10-CM | POA: Diagnosis not present

## 2014-06-06 DIAGNOSIS — Z79899 Other long term (current) drug therapy: Secondary | ICD-10-CM | POA: Diagnosis not present

## 2014-06-06 DIAGNOSIS — M949 Disorder of cartilage, unspecified: Secondary | ICD-10-CM | POA: Diagnosis not present

## 2014-06-06 DIAGNOSIS — M899 Disorder of bone, unspecified: Secondary | ICD-10-CM | POA: Diagnosis not present

## 2014-06-06 DIAGNOSIS — E039 Hypothyroidism, unspecified: Secondary | ICD-10-CM | POA: Diagnosis not present

## 2014-06-06 DIAGNOSIS — E569 Vitamin deficiency, unspecified: Secondary | ICD-10-CM | POA: Diagnosis not present

## 2014-06-12 DIAGNOSIS — E538 Deficiency of other specified B group vitamins: Secondary | ICD-10-CM | POA: Diagnosis not present

## 2014-06-13 DIAGNOSIS — Z0000 Encounter for general adult medical examination without abnormal findings: Secondary | ICD-10-CM | POA: Diagnosis not present

## 2014-07-04 DIAGNOSIS — Z79899 Other long term (current) drug therapy: Secondary | ICD-10-CM | POA: Diagnosis not present

## 2014-07-07 DIAGNOSIS — R21 Rash and other nonspecific skin eruption: Secondary | ICD-10-CM | POA: Diagnosis not present

## 2014-07-17 DIAGNOSIS — E538 Deficiency of other specified B group vitamins: Secondary | ICD-10-CM | POA: Diagnosis not present

## 2014-08-21 DIAGNOSIS — E538 Deficiency of other specified B group vitamins: Secondary | ICD-10-CM | POA: Diagnosis not present

## 2014-09-08 ENCOUNTER — Encounter (HOSPITAL_COMMUNITY): Admission: EM | Disposition: A | Discharge status: Home / Self Care | Payer: Self-pay | Source: Home / Self Care

## 2014-09-08 ENCOUNTER — Emergency Department (HOSPITAL_COMMUNITY): Payer: Medicare Other | Admitting: Anesthesiology

## 2014-09-08 ENCOUNTER — Emergency Department (HOSPITAL_COMMUNITY)
Admission: EM | Admit: 2014-09-08 | Discharge: 2014-09-11 | Disposition: A | Discharge status: Home / Self Care | Payer: Medicare Other | Attending: Urology | Admitting: Urology

## 2014-09-08 ENCOUNTER — Emergency Department (HOSPITAL_COMMUNITY): Payer: Medicare Other

## 2014-09-08 ENCOUNTER — Encounter (HOSPITAL_COMMUNITY): Payer: Medicare Other | Admitting: Anesthesiology

## 2014-09-08 ENCOUNTER — Encounter (HOSPITAL_COMMUNITY): Payer: Self-pay | Admitting: Emergency Medicine

## 2014-09-08 DIAGNOSIS — N1330 Unspecified hydronephrosis: Secondary | ICD-10-CM | POA: Diagnosis not present

## 2014-09-08 DIAGNOSIS — Z01818 Encounter for other preprocedural examination: Secondary | ICD-10-CM | POA: Diagnosis not present

## 2014-09-08 DIAGNOSIS — K219 Gastro-esophageal reflux disease without esophagitis: Secondary | ICD-10-CM | POA: Insufficient documentation

## 2014-09-08 DIAGNOSIS — M1990 Unspecified osteoarthritis, unspecified site: Secondary | ICD-10-CM | POA: Insufficient documentation

## 2014-09-08 DIAGNOSIS — F329 Major depressive disorder, single episode, unspecified: Secondary | ICD-10-CM | POA: Diagnosis not present

## 2014-09-08 DIAGNOSIS — F419 Anxiety disorder, unspecified: Secondary | ICD-10-CM | POA: Diagnosis not present

## 2014-09-08 DIAGNOSIS — N201 Calculus of ureter: Secondary | ICD-10-CM | POA: Diagnosis not present

## 2014-09-08 DIAGNOSIS — Z0389 Encounter for observation for other suspected diseases and conditions ruled out: Secondary | ICD-10-CM | POA: Diagnosis not present

## 2014-09-08 DIAGNOSIS — Z87891 Personal history of nicotine dependence: Secondary | ICD-10-CM | POA: Insufficient documentation

## 2014-09-08 DIAGNOSIS — M810 Age-related osteoporosis without current pathological fracture: Secondary | ICD-10-CM | POA: Diagnosis not present

## 2014-09-08 DIAGNOSIS — N390 Urinary tract infection, site not specified: Secondary | ICD-10-CM | POA: Diagnosis present

## 2014-09-08 DIAGNOSIS — Z85118 Personal history of other malignant neoplasm of bronchus and lung: Secondary | ICD-10-CM | POA: Diagnosis not present

## 2014-09-08 DIAGNOSIS — Z79899 Other long term (current) drug therapy: Secondary | ICD-10-CM | POA: Insufficient documentation

## 2014-09-08 DIAGNOSIS — N12 Tubulo-interstitial nephritis, not specified as acute or chronic: Secondary | ICD-10-CM | POA: Diagnosis not present

## 2014-09-08 DIAGNOSIS — N135 Crossing vessel and stricture of ureter without hydronephrosis: Secondary | ICD-10-CM

## 2014-09-08 DIAGNOSIS — N111 Chronic obstructive pyelonephritis: Secondary | ICD-10-CM | POA: Diagnosis not present

## 2014-09-08 HISTORY — DX: Malignant carcinoid tumor of the bronchus and lung: C7A.090

## 2014-09-08 HISTORY — PX: CYSTOSCOPY WITH STENT PLACEMENT: SHX5790

## 2014-09-08 LAB — CBC WITH DIFFERENTIAL/PLATELET
Basophils Absolute: 0 10*3/uL (ref 0.0–0.1)
Basophils Relative: 0 % (ref 0–1)
Eosinophils Absolute: 0 10*3/uL (ref 0.0–0.7)
Eosinophils Relative: 0 % (ref 0–5)
HCT: 37.8 % (ref 36.0–46.0)
Hemoglobin: 13 g/dL (ref 12.0–15.0)
Lymphocytes Relative: 6 % — ABNORMAL LOW (ref 12–46)
Lymphs Abs: 0.8 10*3/uL (ref 0.7–4.0)
MCH: 29.9 pg (ref 26.0–34.0)
MCHC: 34.4 g/dL (ref 30.0–36.0)
MCV: 86.9 fL (ref 78.0–100.0)
Monocytes Absolute: 0.6 10*3/uL (ref 0.1–1.0)
Monocytes Relative: 5 % (ref 3–12)
Neutro Abs: 12 10*3/uL — ABNORMAL HIGH (ref 1.7–7.7)
Neutrophils Relative %: 89 % — ABNORMAL HIGH (ref 43–77)
Platelets: 256 10*3/uL (ref 150–400)
RBC: 4.35 MIL/uL (ref 3.87–5.11)
RDW: 13 % (ref 11.5–15.5)
WBC: 13.5 10*3/uL — ABNORMAL HIGH (ref 4.0–10.5)

## 2014-09-08 LAB — BASIC METABOLIC PANEL
Anion gap: 15 (ref 5–15)
BUN: 13 mg/dL (ref 6–23)
CO2: 26 mEq/L (ref 19–32)
Calcium: 9.8 mg/dL (ref 8.4–10.5)
Chloride: 100 mEq/L (ref 96–112)
Creatinine, Ser: 0.96 mg/dL (ref 0.50–1.10)
GFR calc Af Amer: 67 mL/min — ABNORMAL LOW (ref >90)
GFR calc non Af Amer: 58 mL/min — ABNORMAL LOW (ref >90)
Glucose, Bld: 124 mg/dL — ABNORMAL HIGH (ref 70–99)
Potassium: 3.3 mEq/L — ABNORMAL LOW (ref 3.7–5.3)
Sodium: 141 mEq/L (ref 137–147)

## 2014-09-08 LAB — URINALYSIS, ROUTINE W REFLEX MICROSCOPIC
Bilirubin Urine: NEGATIVE
Glucose, UA: NEGATIVE mg/dL
Ketones, ur: NEGATIVE mg/dL
Nitrite: NEGATIVE
Protein, ur: NEGATIVE mg/dL
Specific Gravity, Urine: 1.015 (ref 1.005–1.030)
Urobilinogen, UA: 0.2 mg/dL (ref 0.0–1.0)
pH: 5.5 (ref 5.0–8.0)

## 2014-09-08 LAB — URINE MICROSCOPIC-ADD ON

## 2014-09-08 SURGERY — CYSTOSCOPY, WITH STENT INSERTION
Anesthesia: General | Laterality: Bilateral

## 2014-09-08 MED ORDER — OXYCODONE-ACETAMINOPHEN 5-325 MG PO TABS: 1.0000 | ORAL_TABLET | Freq: Four times a day (QID) | ORAL | Status: DC | PRN

## 2014-09-08 MED ORDER — STERILE WATER FOR IRRIGATION IR SOLN
Status: DC | PRN
  Administered 2014-09-08: 3000 mL

## 2014-09-08 MED ORDER — STERILE WATER FOR IRRIGATION IR SOLN
Status: DC | PRN
  Administered 2014-09-08: 1000 mL

## 2014-09-08 MED ORDER — CIPROFLOXACIN HCL 500 MG PO TABS: 500.0000 mg | ORAL_TABLET | Freq: Two times a day (BID) | ORAL | Status: DC

## 2014-09-08 MED ORDER — LIDOCAINE HCL (CARDIAC) 20 MG/ML IV SOLN
INTRAVENOUS | Status: DC | PRN
  Administered 2014-09-08: 20 mg via INTRAVENOUS

## 2014-09-08 MED ORDER — ONDANSETRON HCL 4 MG/2ML IJ SOLN: 4.0000 mg | Freq: Once | INTRAMUSCULAR | Status: AC | PRN

## 2014-09-08 MED ORDER — FENTANYL CITRATE 0.05 MG/ML IJ SOLN
50.0000 ug | Freq: Once | INTRAMUSCULAR | Status: AC
  Administered 2014-09-08: 50 ug via INTRAVENOUS
  Filled 2014-09-08: qty 2

## 2014-09-08 MED ORDER — SODIUM CHLORIDE 0.9 % IV SOLN: 250.0000 mL | INTRAVENOUS | Status: DC | PRN

## 2014-09-08 MED ORDER — SODIUM CHLORIDE 0.9 % IJ SOLN
3.0000 mL | INTRAMUSCULAR | Status: DC | PRN
Start: 2014-09-08 — End: 2014-09-11

## 2014-09-08 MED ORDER — ONDANSETRON HCL 4 MG/2ML IJ SOLN
4.0000 mg | Freq: Once | INTRAMUSCULAR | Status: AC
  Administered 2014-09-08: 4 mg via INTRAVENOUS
  Filled 2014-09-08: qty 2

## 2014-09-08 MED ORDER — ACETAMINOPHEN 650 MG RE SUPP
650.0000 mg | RECTAL | Status: DC | PRN
Start: 2014-09-08 — End: 2014-09-11
  Filled 2014-09-08: qty 1

## 2014-09-08 MED ORDER — SODIUM CHLORIDE 0.9 % IV SOLN
INTRAVENOUS | Status: DC
  Administered 2014-09-08: 04:00:00 via INTRAVENOUS

## 2014-09-08 MED ORDER — FENTANYL CITRATE 0.05 MG/ML IJ SOLN
INTRAMUSCULAR | Status: DC | PRN
  Administered 2014-09-08: 25 ug via INTRAVENOUS
  Administered 2014-09-08: 50 ug via INTRAVENOUS
  Administered 2014-09-08: 25 ug via INTRAVENOUS

## 2014-09-08 MED ORDER — FENTANYL CITRATE 0.05 MG/ML IJ SOLN
INTRAMUSCULAR | Status: AC
  Filled 2014-09-08: qty 2

## 2014-09-08 MED ORDER — IOHEXOL 350 MG/ML SOLN
INTRAVENOUS | Status: DC | PRN
  Administered 2014-09-08: 50 mL via URETHRAL

## 2014-09-08 MED ORDER — SODIUM CHLORIDE 0.9 % IJ SOLN: 3.0000 mL | Freq: Two times a day (BID) | INTRAMUSCULAR | Status: DC

## 2014-09-08 MED ORDER — LACTATED RINGERS IV SOLN
INTRAVENOUS | Status: DC
  Administered 2014-09-08: 11:00:00 via INTRAVENOUS

## 2014-09-08 MED ORDER — ACETAMINOPHEN 325 MG PO TABS
650.0000 mg | ORAL_TABLET | ORAL | Status: DC | PRN
Start: 2014-09-08 — End: 2014-09-11

## 2014-09-08 MED ORDER — FENTANYL CITRATE 0.05 MG/ML IJ SOLN: 25.0000 ug | INTRAMUSCULAR | Status: DC | PRN

## 2014-09-08 MED ORDER — IOHEXOL 300 MG/ML  SOLN
100.0000 mL | Freq: Once | INTRAMUSCULAR | Status: AC | PRN
  Administered 2014-09-08: 100 mL via INTRAVENOUS

## 2014-09-08 MED ORDER — IOHEXOL 300 MG/ML  SOLN
50.0000 mL | Freq: Once | INTRAMUSCULAR | Status: AC | PRN
  Administered 2014-09-08: 50 mL via ORAL

## 2014-09-08 MED ORDER — FENTANYL CITRATE 0.05 MG/ML IJ SOLN
25.0000 ug | INTRAMUSCULAR | Status: AC
  Administered 2014-09-08: 25 ug via INTRAVENOUS
  Filled 2014-09-08: qty 2

## 2014-09-08 MED ORDER — MIDAZOLAM HCL 2 MG/2ML IJ SOLN
1.0000 mg | INTRAMUSCULAR | Status: DC | PRN
  Administered 2014-09-08: 2 mg via INTRAVENOUS
  Filled 2014-09-08: qty 2

## 2014-09-08 MED ORDER — PROPOFOL 10 MG/ML IV BOLUS
INTRAVENOUS | Status: DC | PRN
  Administered 2014-09-08: 100 mg via INTRAVENOUS

## 2014-09-08 MED ORDER — PHENAZOPYRIDINE HCL 200 MG PO TABS: 200.0000 mg | ORAL_TABLET | Freq: Three times a day (TID) | ORAL | Status: DC | PRN

## 2014-09-08 MED ORDER — OXYCODONE HCL 5 MG PO TABS: 5.0000 mg | ORAL_TABLET | ORAL | Status: DC | PRN

## 2014-09-08 MED ORDER — DEXTROSE 5 % IV SOLN
1.0000 g | Freq: Once | INTRAVENOUS | Status: AC
  Administered 2014-09-08: 1 g via INTRAVENOUS
  Filled 2014-09-08: qty 10

## 2014-09-08 MED ORDER — TRAMADOL HCL 50 MG PO TABS: 50.0000 mg | ORAL_TABLET | Freq: Four times a day (QID) | ORAL | Status: DC | PRN

## 2014-09-08 MED ORDER — HYDROMORPHONE HCL 1 MG/ML IJ SOLN
1.0000 mg | Freq: Once | INTRAMUSCULAR | Status: AC
  Administered 2014-09-08: 1 mg via INTRAVENOUS
  Filled 2014-09-08: qty 1

## 2014-09-08 SURGICAL SUPPLY — 26 items
BAG DRAIN URO TABLE W/ADPT NS (DRAPE) ×1 IMPLANT
BAG DRN 8 ADPR NS SKTRN CSTL (DRAPE) ×1
BAG HAMPER (MISCELLANEOUS) ×1 IMPLANT
CLOTH BEACON ORANGE TIMEOUT ST (SAFETY) ×1 IMPLANT
GLOVE BIOGEL PI IND STRL 7.0 (GLOVE) IMPLANT
GLOVE BIOGEL PI IND STRL 7.5 (GLOVE) IMPLANT
GLOVE BIOGEL PI INDICATOR 7.0 (GLOVE) ×1
GLOVE BIOGEL PI INDICATOR 7.5 (GLOVE) ×1
GLOVE EXAM NITRILE MD LF STRL (GLOVE) ×1 IMPLANT
GLOVE OPTIFIT SS 6.5 STRL BRWN (GLOVE) ×1 IMPLANT
GLOVE SS BIOGEL STRL SZ 7.5 (GLOVE) IMPLANT
GLOVE SUPERSENSE BIOGEL SZ 7.5 (GLOVE) ×1
GLOVE SURG SS PI 8.0 STRL IVOR (GLOVE) ×1 IMPLANT
GOWN STRL REUS W/ TWL LRG LVL3 (GOWN DISPOSABLE) IMPLANT
GOWN STRL REUS W/ TWL XL LVL3 (GOWN DISPOSABLE) IMPLANT
GOWN STRL REUS W/TWL LRG LVL3 (GOWN DISPOSABLE) ×2
GOWN STRL REUS W/TWL XL LVL3 (GOWN DISPOSABLE) ×4
GUIDEWIRE STR DUAL SENSOR (WIRE) ×1 IMPLANT
IV NS IRRIG 3000ML ARTHROMATIC (IV SOLUTION) ×1 IMPLANT
KIT ROOM TURNOVER AP CYSTO (KITS) ×1 IMPLANT
MANIFOLD NEPTUNE II (INSTRUMENTS) ×1 IMPLANT
PACK CYSTO (CUSTOM PROCEDURE TRAY) ×1 IMPLANT
PAD ARMBOARD 7.5X6 YLW CONV (MISCELLANEOUS) ×1 IMPLANT
STENT URET 6FRX24 CONTOUR (STENTS) ×2 IMPLANT
WATER STERILE IRR 1000ML POUR (IV SOLUTION) ×1 IMPLANT
WATER STERILE IRR 3000ML UROMA (IV SOLUTION) ×1 IMPLANT

## 2014-09-08 NOTE — ED Provider Notes (Signed)
CSN: [id number]     Arrival date & time 09/08/14  0154 History   First MD Initiated Contact with Patient 09/08/14 [phone]     Chief Complaint  Patient presents with  . Abdominal Pain     (Consider location/radiation/quality/duration/timing/severity/associated sxs/prior Treatment) HPI This is a 73 year old female with a history of kidney stones. She is here with abdominal pain that began about 11 PM yesterday evening. The onset was fairly sudden. She is having difficulty characterizing the pain is not sure if it feels like prior kidney stones. The pain is located primarily in the right suprapubic region. It is worse with movement or palpation. There is no associated nausea and vomiting. She has not noticed hematuria. She's not had fever or chills. She rates her pain as a 10 out of 10.  Past Medical History  Diagnosis Date  . Carpal tunnel syndrome, bilateral   . Kidney stones   . GERD (gastroesophageal reflux disease)   . Anxiety   . Depression   . Arthritis   . Osteoporosis    Past Surgical History  Procedure Laterality Date  . Appendectomy    . Thoracotomy  2007    vatz-rt upper lobe  . Cystoscopy w/ ureteral stent placement  2010    lt-lazer stone  . Colon resection  2004    perf bowel after colonoscopy  . Tonsillectomy    . Carpal tunnel release  01/21/2012    Procedure: CARPAL TUNNEL RELEASE;  Surgeon: Wynonia Sours, MD;  Location: [place];  Service: Orthopedics;  Laterality: Right;  . Abdominal hysterectomy      vaginal   History reviewed. No pertinent family history. History  Substance Use Topics  . Smoking status: Former Smoker    Types: Cigarettes    Quit date: 12/01/1996  . Smokeless tobacco: Not on file  . Alcohol Use: No   OB History   Grav Para Term Preterm Abortions TAB SAB Ect Mult Living                 Review of Systems  All other systems reviewed and are negative.   Allergies  Review of patient's allergies indicates no known  allergies.  Home Medications   Prior to Admission medications   Medication Sig Start Date End Date Taking? Authorizing Provider  Biotin 2.5 MG TABS Take by mouth daily.   Yes Historical Provider, MD  Cyanocobalamin (VITAMIN B-12 IJ) Inject as directed every 30 (thirty) days.   Yes Historical Provider, MD  cycloSPORINE (RESTASIS) 0.05 % ophthalmic emulsion 1 drop 2 (two) times daily.   Yes Historical Provider, MD  glucosamine-chondroitin 500-400 MG tablet Take 1 tablet by mouth daily.   Yes Historical Provider, MD  meloxicam (MOBIC) 15 MG tablet Take 15 mg by mouth daily as needed for pain.   Yes Historical Provider, MD  Multiple Vitamin (MULTIVITAMIN) capsule Take 1 capsule by mouth daily.   Yes Historical Provider, MD  cholecalciferol (VITAMIN D) 1000 UNITS tablet Take 1,000 Units by mouth daily.    Historical Provider, MD  gabapentin (NEURONTIN) 300 MG capsule Take 300 mg by mouth Nightly.    Historical Provider, MD  pantoprazole (PROTONIX) 40 MG tablet Take 40 mg by mouth daily.    Historical Provider, MD  thiamine (VITAMIN B-1) 100 MG tablet Take 100 mg by mouth daily.    Historical Provider, MD  traMADol (ULTRAM) 50 MG tablet Take 50 mg by mouth every 6 (six) hours as needed.    Historical  Provider, MD  traZODone (DESYREL) 50 MG tablet Take 50 mg by mouth at bedtime.    Historical Provider, MD  venlafaxine (EFFEXOR) 75 MG tablet Take 75 mg by mouth 1 day or 1 dose.    Historical Provider, MD   BP 163/92  Pulse 104  Temp(Src) 97.6 F (36.4 C) (Oral)  Resp 20  Ht 4\' 11"  (1.499 m)  Wt 96 lb (43.545 kg)  BMI 19.38 kg/m2  SpO2 100%  Physical Exam General: Well-developed, well-nourished female in no acute distress; appearance consistent with age of record HENT: normocephalic; atraumatic Eyes: pupils equal, round and reactive to light; extraocular muscles intact Neck: supple Heart: regular rate and rhythm Lungs: clear to auscultation bilaterally Abdomen: soft; nondistended; right  suprapubic tenderness; no masses or hepatosplenomegaly; bowel sounds present Extremities: Arthritic changes; full range of motion; pulses normal Neurologic: Awake, alert and oriented; motor function intact in all extremities and symmetric; no facial droop Skin: Warm and dry Psychiatric: Normal mood and affect    ED Course  Procedures (including critical care time)  MDM  Nursing notes and vitals signs, including pulse oximetry, reviewed.  Summary of this visit's results, reviewed by myself:  Labs:  Results for orders placed during the hospital encounter of 09/08/14 (from the past 24 hour(s))  URINALYSIS, ROUTINE W REFLEX MICROSCOPIC     Status: Abnormal   Collection Time    09/08/14  3:00 AM      Result Value Ref Range   Color, Urine YELLOW  YELLOW   APPearance CLEAR  CLEAR   Specific Gravity, Urine 1.015  1.005 - 1.030   pH 5.5  5.0 - 8.0   Glucose, UA NEGATIVE  NEGATIVE mg/dL   Hgb urine dipstick LARGE (*) NEGATIVE   Bilirubin Urine NEGATIVE  NEGATIVE   Ketones, ur NEGATIVE  NEGATIVE mg/dL   Protein, ur NEGATIVE  NEGATIVE mg/dL   Urobilinogen, UA 0.2  0.0 - 1.0 mg/dL   Nitrite NEGATIVE  NEGATIVE   Leukocytes, UA SMALL (*) NEGATIVE  CBC WITH DIFFERENTIAL     Status: Abnormal   Collection Time    09/08/14  3:00 AM      Result Value Ref Range   WBC 13.5 (*) 4.0 - 10.5 K/uL   RBC 4.35  3.87 - 5.11 MIL/uL   Hemoglobin 13.0  12.0 - 15.0 g/dL   HCT 37.8  36.0 - 46.0 %   MCV 86.9  78.0 - 100.0 fL   MCH 29.9  26.0 - 34.0 pg   MCHC 34.4  30.0 - 36.0 g/dL   RDW 13.0  11.5 - 15.5 %   Platelets 256  150 - 400 K/uL   Neutrophils Relative % 89 (*) 43 - 77 %   Neutro Abs 12.0 (*) 1.7 - 7.7 K/uL   Lymphocytes Relative 6 (*) 12 - 46 %   Lymphs Abs 0.8  0.7 - 4.0 K/uL   Monocytes Relative 5  3 - 12 %   Monocytes Absolute 0.6  0.1 - 1.0 K/uL   Eosinophils Relative 0  0 - 5 %   Eosinophils Absolute 0.0  0.0 - 0.7 K/uL   Basophils Relative 0  0 - 1 %   Basophils Absolute 0.0  0.0 -  0.1 K/uL  BASIC METABOLIC PANEL     Status: Abnormal   Collection Time    09/08/14  3:00 AM      Result Value Ref Range   Sodium 141  137 - 147 mEq/L   Potassium  3.3 (*) 3.7 - 5.3 mEq/L   Chloride 100  96 - 112 mEq/L   CO2 26  19 - 32 mEq/L   Glucose, Bld 124 (*) 70 - 99 mg/dL   BUN 13  6 - 23 mg/dL   Creatinine, Ser 0.96  0.50 - 1.10 mg/dL   Calcium 9.8  8.4 - 10.5 mg/dL   GFR calc non Af Amer 58 (*) >90 mL/min   GFR calc Af Amer 67 (*) >90 mL/min   Anion gap 15  5 - 15  URINE MICROSCOPIC-ADD ON     Status: Abnormal   Collection Time    09/08/14  3:00 AM      Result Value Ref Range   Squamous Epithelial / LPF FEW (*) RARE   WBC, UA TOO NUMEROUS TO COUNT  <3 WBC/hpf   RBC / HPF TOO NUMEROUS TO COUNT  <3 RBC/hpf   Bacteria, UA MANY (*) RARE    Imaging Studies: Ct Abdomen Pelvis W Contrast  09/08/2014   CLINICAL DATA:  Acute onset of right lower quadrant abdominal pain for 12 hours. Personal history of renal stones. Initial encounter.  EXAM: CT ABDOMEN AND PELVIS WITH CONTRAST  TECHNIQUE: Multidetector CT imaging of the abdomen and pelvis was performed using the standard protocol following bolus administration of intravenous contrast.  CONTRAST:  124mL OMNIPAQUE IOHEXOL 300 MG/ML SOLN, 97mL OMNIPAQUE IOHEXOL 300 MG/ML SOLN  COMPARISON:  CT of the abdomen and pelvis performed 09/16/2010  FINDINGS: Minimal left basilar atelectasis is noted.  The liver and spleen are unremarkable in appearance. The gallbladder is within normal limits. The pancreas and adrenal glands are unremarkable.  There is moderate right-sided hydronephrosis, with diffuse right-sided perinephric stranding and fluid, and prominence of the right ureter. An obstructing 5 mm stone is noted distally at the right vesicoureteral junction, with two tiny stones seen more proximally in the distal right ureter.  Given the large number of white blood cells in the urine, mild right-sided pyelonephritis cannot be excluded.  There is  severe chronic left-sided hydronephrosis, with partial left renal atrophy, reflecting obstruction due to a large 1.3 x 0.7 cm stone proximally, at the left ureteropelvic junction. A nonobstructing 1.1 cm stone is again noted at the interpole region of the left kidney.  No free fluid is identified. The small bowel is unremarkable in appearance. The stomach is within normal limits. No acute vascular abnormalities are seen.  Postoperative change is noted about the cecum. The patient is status post appendectomy. 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 The colon is unremarkable in appearance.  The bladder is mildly distended and grossly unremarkable. The patient is status post hysterectomy. The ovaries are relatively symmetric. No suspicious adnexal masses are seen. No inguinal lymphadenopathy is seen.  No acute osseous abnormalities are identified. Multilevel vacuum phenomenon and mild disc narrowing is noted along the lumbar spine.  IMPRESSION: 1. Moderate right-sided hydronephrosis, with diffuse right-sided perinephric stranding and fluid, and an obstructing 5 mm stone seen distally at the right vesicoureteral junction. Two tiny stones seen more proximally in the distal right ureter. Given the large number of white blood cells in the urine, mild right-sided pyelonephritis cannot be excluded. 2. Severe chronic left-sided hydronephrosis, with partial left renal atrophy, reflecting chronic obstruction due to a large 1.3 x 0.7 cm stone proximally, at the left ureteropelvic junction. Given bilateral obstruction, this raises potential for relatively rapid onset of acute renal failure. 3.  Nonobstructing 1.1 cm stone again noted at the apical region of the left kidney. 4. 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 5. Mild degenerative change along the lumbar spine.  These results were called by telephone at  the time of interpretation on 09/08/2014 at 6:56 am to Dr. Shanon Rosser, who verbally acknowledged these results.   Electronically Signed   By: Garald Balding M.D.   On: 09/08/2014 06:58   Dr. Jeffie Pollock will admit for stent placement.     Wynetta Fines, MD 09/08/14 [phone]

## 2014-09-08 NOTE — Brief Op Note (Signed)
09/08/2014  12:57 PM  PATIENT:  [name]  73 y.o. female  PRE-OPERATIVE DIAGNOSIS:  bilateral obstructing ureteral stones with infection.  POST-OPERATIVE DIAGNOSIS:  bilateral obstructing ureteral stones with infection.  PROCEDURE:  Procedure(s): CYSTOSCOPY WITH STENT PLACEMENT (Bilateral)  SURGEON:  Surgeon(s) and Role:    * Malka So, MD - Primary  PHYSICIAN ASSISTANT:   ASSISTANTS: none   ANESTHESIA:   general  EBL:  Total I/O In: 800 [I.V.:800] Out: -   BLOOD ADMINISTERED:none  DRAINS: bilateral 6 x 24 JJ stents   LOCAL MEDICATIONS USED:  NONE  SPECIMEN:  No Specimen  DISPOSITION OF SPECIMEN:  N/A  COUNTS:  YES  TOURNIQUET:  * No tourniquets in log *  DICTATION: .Other Dictation: Dictation Number [id number]  PLAN OF CARE: Discharge to home after PACU  PATIENT DISPOSITION:  PACU - hemodynamically stable.   Delay start of Pharmacological VTE agent (>24hrs) due to surgical blood loss or risk of bleeding: not applicable

## 2014-09-08 NOTE — Anesthesia Postprocedure Evaluation (Signed)
  Anesthesia Post-op Note  Patient: [name]  Procedure(s) Performed: Procedure(s): CYSTOSCOPY WITH STENT PLACEMENT (Bilateral)  Patient Location: PACU  Anesthesia Type:General  Level of Consciousness: awake, alert  and oriented  Airway and Oxygen Therapy: Patient Spontanous Breathing and Patient connected to face mask oxygen  Post-op Pain: none  Post-op Assessment: Post-op Vital signs reviewed, Patient's Cardiovascular Status Stable, Respiratory Function Stable, Patent Airway and No signs of Nausea or vomiting  Post-op Vital Signs: Reviewed and stable  Last Vitals:  Filed Vitals:   09/08/14 1220  BP: 115/67  Pulse:   Temp:   Resp: 13    Complications: No apparent anesthesia complications

## 2014-09-08 NOTE — Anesthesia Procedure Notes (Addendum)
Procedure Name: LMA Insertion Date/Time: 09/08/2014 12:32 PM Performed by: Tressie Stalker E Pre-anesthesia Checklist: Patient identified, Patient being monitored, Emergency Drugs available, Timeout performed and Suction available Patient Re-evaluated:Patient Re-evaluated prior to inductionOxygen Delivery Method: Circle System Utilized Preoxygenation: Pre-oxygenation with 100% oxygen Intubation Type: IV induction Ventilation: Mask ventilation without difficulty LMA: LMA inserted LMA Size: 3.0 Number of attempts: 1 Placement Confirmation: positive ETCO2 and breath sounds checked- equal and bilateral

## 2014-09-08 NOTE — ED Notes (Signed)
Report given to Tomi in Pre-op area.

## 2014-09-08 NOTE — Anesthesia Preprocedure Evaluation (Signed)
Anesthesia Evaluation  Patient identified by MRN, date of birth, ID band Patient awake    Reviewed: Allergy & Precautions, H&P , NPO status , Patient's Chart, lab work & pertinent test results  History of Anesthesia Complications Negative for: history of anesthetic complications  Airway Mallampati: I TM Distance: >3 FB Neck ROM: Full    Dental  (+) Teeth Intact   Pulmonary former smoker,  Carcinoid bronchial adenoma of right lung breath sounds clear to auscultation        Cardiovascular negative cardio ROS  Rhythm:Regular Rate:Normal     Neuro/Psych PSYCHIATRIC DISORDERS Anxiety Depression  Neuromuscular disease    GI/Hepatic GERD- (inactive now)  Controlled,  Endo/Other    Renal/GU Renal disease     Musculoskeletal  (+) Arthritis -,   Abdominal   Peds  Hematology   Anesthesia Other Findings   Reproductive/Obstetrics                           Anesthesia Physical Anesthesia Plan  ASA: II  Anesthesia Plan: General   Post-op Pain Management:    Induction: Intravenous  Airway Management Planned: LMA  Additional Equipment:   Intra-op Plan:   Post-operative Plan: Extubation in OR  Informed Consent: I have reviewed the patients History and Physical, chart, labs and discussed the procedure including the risks, benefits and alternatives for the proposed anesthesia with the patient or authorized representative who has indicated his/her understanding and acceptance.     Plan Discussed with:   Anesthesia Plan Comments:         Anesthesia Quick Evaluation

## 2014-09-08 NOTE — ED Notes (Signed)
I case of an emergency, call Marlyn Corporal, pt's daughter @[phone]. Pt states she is not going to call until after the fact.

## 2014-09-08 NOTE — OR Nursing (Signed)
Patient called to question discharge instructions about Cipro. Discharge instructions state to start the day before follow-up visit. Dr. Jeffie Pollock notified and said for patient to start Cipro tonight. Patient informed and verbalized understanding.

## 2014-09-08 NOTE — Consult Note (Signed)
Subjective: I was asked to see [name] in consultation by Dr. Florina Ou for a right ureteral stone with possible infection.   She had the onset last night of moderate to severe right flank pain without nausea or voiding symptoms.   She continued to have pain and came to the ER where she was found to have a UA with TNTC WBC, RBC and many bacteria.   A CT shows a 76mm obstructing right UVJ stone with perinephric urine and a 1.70mm L UPJ stone with obstruction that is chronic but progressive with worsening atrophy over the last few years on multiple CT's.   There is a 1.1cm left renal stone as well.   She reports no fever.  ROS:  Review of Systems  Constitutional: Negative for fever and chills.  HENT: Negative.   Eyes: Negative.   Respiratory: Negative.   Cardiovascular: Negative.   Gastrointestinal: Positive for abdominal pain. Negative for nausea.  Genitourinary: Positive for flank pain. Negative for dysuria and urgency.  Musculoskeletal: Negative.   Skin: Negative.   Neurological: Negative.   Endo/Heme/Allergies: Negative.   Psychiatric/Behavioral: Negative.    No Known Allergies  Past Medical History  Diagnosis Date  . Carpal tunnel syndrome, bilateral   . Kidney stones   . GERD (gastroesophageal reflux disease)   . Anxiety   . Depression   . Arthritis   . Osteoporosis   . Carcinoid bronchial adenoma of right lung     Past Surgical History  Procedure Laterality Date  . Appendectomy    . Thoracotomy  2007    vatz-rt upper lobe  . Cystoscopy w/ ureteral stent placement  2010    lt-lazer stone  . Colon resection  2004    perf bowel after colonoscopy  . Tonsillectomy    . Carpal tunnel release  01/21/2012    Procedure: CARPAL TUNNEL RELEASE;  Surgeon: Wynonia Sours, MD;  Location: [facility];  Service: Orthopedics;  Laterality: Right;  . Abdominal hysterectomy      vaginal    History   Social History  . Marital Status: Widowed    Spouse Name: N/A    Number of Children: N/A  . Years of Education: N/A   Occupational History  . Not on file.   Social History Main Topics  . Smoking status: Former Smoker    Types: Cigarettes    Quit date: 12/01/1996  . Smokeless tobacco: Not on file  . Alcohol Use: No  . Drug Use: No  . Sexual Activity: Not on file   Other Topics Concern  . Not on file   Social History Narrative  . No narrative on file    History reviewed. No pertinent family history.  Anti-infectives: Anti-infectives   Start     Dose/Rate Route Frequency Ordered Stop   09/08/14 0530  cefTRIAXone (ROCEPHIN) 1 g in dextrose 5 % 50 mL IVPB     1 g 100 mL/hr over 30 Minutes Intravenous  Once 09/08/14 0522 09/08/14 0658      Current Facility-Administered Medications  Medication Dose Route Frequency Provider Last Rate Last Dose  . 0.9 %  sodium chloride infusion   Intravenous Continuous Wynetta Fines, MD       Current Outpatient Prescriptions  Medication Sig Dispense Refill  . Biotin 2.5 MG TABS Take by mouth daily.      . Cyanocobalamin (VITAMIN B-12 IJ) Inject as directed every 30 (thirty) days.      . cycloSPORINE (  RESTASIS) 0.05 % ophthalmic emulsion 1 drop 2 (two) times daily.      Marland Kitchen glucosamine-chondroitin 500-400 MG tablet Take 1 tablet by mouth daily.      . meloxicam (MOBIC) 15 MG tablet Take 15 mg by mouth daily as needed for pain.      . Multiple Vitamin (MULTIVITAMIN) capsule Take 1 capsule by mouth daily.      . cholecalciferol (VITAMIN D) 1000 UNITS tablet Take 1,000 Units by mouth daily.      Marland Kitchen gabapentin (NEURONTIN) 300 MG capsule Take 300 mg by mouth Nightly.      . pantoprazole (PROTONIX) 40 MG tablet Take 40 mg by mouth daily.      Marland Kitchen thiamine (VITAMIN B-1) 100 MG tablet Take 100 mg by mouth daily.      . traMADol (ULTRAM) 50 MG tablet Take 50 mg by mouth every 6 (six) hours as needed.      . traZODone (DESYREL) 50 MG tablet Take 50 mg by mouth at bedtime.      Marland Kitchen venlafaxine (EFFEXOR) 75 MG tablet Take  75 mg by mouth 1 day or 1 dose.         Objective: Vital signs in last 24 hours: Temp:  [97.6 F (36.4 C)] 97.6 F (36.4 C) (10/09 0208) Pulse Rate:  [77-104] 104 (10/09 0619) Resp:  [20-22] 20 (10/09 0619) BP: (163-174)/(92-95) 163/92 mmHg (10/09 0619) SpO2:  [100 %] 100 % (10/09 0619) Weight:  [43.545 kg (96 lb)] 43.545 kg (96 lb) (10/09 0208)  Intake/Output from previous day:   Intake/Output this shift:     Physical Exam  Vitals reviewed. Constitutional: She is oriented to person, place, and time and well-developed, well-nourished, and in no distress.  HENT:  Head: Normocephalic and atraumatic.  Neck: Normal range of motion. Neck supple. No thyromegaly present.  Cardiovascular: Normal rate, regular rhythm and normal heart sounds.   No murmur heard. Pulmonary/Chest: Effort normal and breath sounds normal. No respiratory distress.  Abdominal: Soft. Bowel sounds are normal. She exhibits no mass. There is tenderness (RLQ and RCVAT). There is no rebound.  Musculoskeletal: Normal range of motion. She exhibits no edema and no tenderness.  Neurological: She is alert and oriented to person, place, and time.  Skin: Skin is warm and dry. She is not diaphoretic. No erythema.  Psychiatric: Mood and affect normal.    Lab Results:   Recent Labs  09/08/14 0300  WBC 13.5*  HGB 13.0  HCT 37.8  PLT 256   BMET  Recent Labs  09/08/14 0300  NA 141  K 3.3*  CL 100  CO2 26  GLUCOSE 124*  BUN 13  CREATININE 0.96  CALCIUM 9.8   PT/INR No results found for this basename: LABPROT, INR,  in the last 72 hours ABG No results found for this basename: PHART, PCO2, PO2, HCO3,  in the last 72 hours  Studies/Results: Ct Abdomen Pelvis W Contrast  09/08/2014   CLINICAL DATA:  Acute onset of right lower quadrant abdominal pain for 12 hours. Personal history of renal stones. Initial encounter.  EXAM: CT ABDOMEN AND PELVIS WITH CONTRAST  TECHNIQUE: Multidetector CT imaging of the  abdomen and pelvis was performed using the standard protocol following bolus administration of intravenous contrast.  CONTRAST:  163mL OMNIPAQUE IOHEXOL 300 MG/ML SOLN, 58mL OMNIPAQUE IOHEXOL 300 MG/ML SOLN  COMPARISON:  CT of the abdomen and pelvis performed 09/16/2010  FINDINGS: Minimal left basilar atelectasis is noted.  The liver and spleen are unremarkable  in appearance. The gallbladder is within normal limits. The pancreas and adrenal glands are unremarkable.  There is moderate right-sided hydronephrosis, with diffuse right-sided perinephric stranding and fluid, and prominence of the right ureter. An obstructing 5 mm stone is noted distally at the right vesicoureteral junction, with two tiny stones seen more proximally in the distal right ureter.  Given the large number of white blood cells in the urine, mild right-sided pyelonephritis cannot be excluded.  There is severe chronic left-sided hydronephrosis, with partial left renal atrophy, reflecting obstruction due to a large 1.3 x 0.7 cm stone proximally, at the left ureteropelvic junction. A nonobstructing 1.1 cm stone is again noted at the interpole region of the left kidney.  No free fluid is identified. The small bowel is unremarkable in appearance. The stomach is within normal limits. No acute vascular abnormalities are seen.  Postoperative change is noted about the cecum. The patient is status post appendectomy. 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 The colon is unremarkable in appearance.  The bladder is mildly distended and grossly unremarkable. The patient is status post hysterectomy. The ovaries are relatively symmetric. No suspicious adnexal masses are seen. No inguinal lymphadenopathy is seen.  No acute osseous abnormalities are identified. Multilevel vacuum phenomenon and mild disc narrowing is noted along the lumbar spine.  IMPRESSION: 1. Moderate right-sided hydronephrosis,  with diffuse right-sided perinephric stranding and fluid, and an obstructing 5 mm stone seen distally at the right vesicoureteral junction. Two tiny stones seen more proximally in the distal right ureter. Given the large number of white blood cells in the urine, mild right-sided pyelonephritis cannot be excluded. 2. Severe chronic left-sided hydronephrosis, with partial left renal atrophy, reflecting chronic obstruction due to a large 1.3 x 0.7 cm stone proximally, at the left ureteropelvic junction. Given bilateral obstruction, this raises potential for relatively rapid onset of acute renal failure. 3. Nonobstructing 1.1 cm stone again noted at the apical region of the left kidney. 4. 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 5. Mild degenerative change along the lumbar spine.  These results were called by telephone at the time of interpretation on 09/08/2014 at 6:56 am to Dr. Shanon Rosser, who verbally acknowledged these results.   Electronically Signed   By: Garald Balding M.D.   On: 09/08/2014 06:58     Assessment: She has a symptomatic obstructing RUVJ stone with a possible UTI. 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   Plan: She needs cystoscopy with insertion of bilateral stents and will need subsequent ureteroscopic management of the right distal stone and left ureteral and renal stones.   I reviewed the risk of the stent insertion including bleeding, infection, ureteral injury, need for secondary procedures, thrombotic events and anesthetic risks.  The stents will be placed later today.   CC: Dr. Jenny Reichmann [place]   LOS: 0 days    Malka So 09/08/2014

## 2014-09-08 NOTE — ED Notes (Signed)
Pt has lower abdominal pain, right side back pain, has history of kidney stones, had appendectomy.

## 2014-09-08 NOTE — ED Provider Notes (Signed)
Pt will be going to OR with urology   Date: 09/08/2014 0907am  Rate: 89  Rhythm: normal sinus rhythm  QRS Axis: normal  Intervals: normal  ST/T Wave abnormalities: nonspecific ST changes  Conduction Disutrbances:none     Sharyon Cable, MD 09/08/14 [phone]

## 2014-09-08 NOTE — Transfer of Care (Signed)
Immediate Anesthesia Transfer of Care Note  Patient: [name]  Procedure(s) Performed: Procedure(s): CYSTOSCOPY WITH STENT PLACEMENT (Bilateral)  Patient Location: PACU  Anesthesia Type:General  Level of Consciousness: awake and alert   Airway & Oxygen Therapy: Patient Spontanous Breathing  Post-op Assessment: Report given to PACU RN  Post vital signs: Reviewed  Complications: No apparent anesthesia complications

## 2014-09-08 NOTE — Discharge Instructions (Signed)
Ureteral Stent Implantation, Care After °Refer to this sheet in the next few weeks. These instructions provide you with information on caring for yourself after your procedure. Your health care provider may also give you more specific instructions. Your treatment has been planned according to current medical practices, but problems sometimes occur. Call your health care provider if you have any problems or questions after your procedure. °WHAT TO EXPECT AFTER THE PROCEDURE °You should be back to normal activity within 48 hours after the procedure. Nausea and vomiting may occur and are commonly the result of anesthesia. °It is common to experience sharp pain in the back or lower abdomen and penis with voiding. This is caused by movement of the ends of the stent with the act of urinating. It usually goes away within minutes after you have stopped urinating. °HOME CARE INSTRUCTIONS °Make sure to drink plenty of fluids. You may have small amounts of bleeding, causing your urine to be red. This is normal. Certain movements may trigger pain or a feeling that you need to urinate. You may be given medicines to prevent infection or bladder spasms. Be sure to take all medicines as directed. Only take over-the-counter or prescription medicines for pain, discomfort, or fever as directed by your health care provider. Do not take aspirin, as this can make bleeding worse. °Your stent will be left in until the blockage is resolved. This may take 2 weeks or longer, depending on the reason for stent implantation. You may have an X-ray exam to make sure your ureter is open and that the stent has not moved out of position (migrated). The stent can be removed by your health care provider in the office. Medicines may be given for comfort while the stent is being removed. Be sure to keep all follow-up appointments so your health care provider can check that you are healing properly. °SEEK MEDICAL CARE IF: °· You experience increasing  pain. °· Your pain medicine is not working. °SEEK IMMEDIATE MEDICAL CARE IF: °· Your urine is dark red or has blood clots. °· You are leaking urine (incontinent). °· You have a fever, chills, feeling sick to your stomach (nausea), or vomiting. °· Your pain is not relieved by pain medicine. °· The end of the stent comes out of the urethra. °· You are unable to urinate. °Document Released: 07/20/2013 Document Revised: 11/22/2013 Document Reviewed: 07/20/2013 °ExitCare® Patient Information ©2015 [employer]. This information is not intended to replace advice given to you by your health care provider. Make sure you discuss any questions you have with your health care provider. ° °

## 2014-09-08 NOTE — ED Notes (Signed)
Patient to pre-op area by stretcher. Consent sent with patient as well as all of her belongings, including clothing and Best boy.

## 2014-09-08 NOTE — Op Note (Signed)
[name], [name]              ACCOUNT NO.:  [id number]  MEDICAL RECORD NO.:  [id number]  LOCATION:  [facility]                          FACILITY:  [facility]  PHYSICIAN:  Marshall Cork. Jeffie Pollock, M.D.    DATE OF BIRTH:  1941/03/08  DATE OF PROCEDURE:  09/08/2014 DATE OF DISCHARGE:                              OPERATIVE REPORT   PROCEDURE:  Cystoscopy with insertion of bilateral double-J stent.  PREOPERATIVE DIAGNOSES:  Right distal ureteral stone, left proximal ureteral stone with obstruction, and possible urinary tract infection.  POSTOPERATIVE DIAGNOSES:  Right distal ureteral stone, left proximal ureteral stone with obstruction, and possible urinary tract infection.  SURGEON:  Marshall Cork. Jeffie Pollock, MD  ANESTHESIA:  General.  SPECIMEN:  None.  DRAINS:  Bilateral 6-French 24-cm double-J stents.  COMPLICATIONS:  None.  INDICATIONS:  [name] is a 73 year old white female, who presented to the emergency room with right flank pain.  She was found to have a 5- mm obstructing right distal ureteral stone.  Her urine had too numerous to count white cells, red cells, and bacteria and was concerning for infection, although she had been afebrile and remained that way since her admission.  She also had a 1.3-cm left proximal ureteral stone with obstruction that appeared chronic with some renal atrophy and 1.1-cm renal stone.  It was felt that cystoscopy and bilateral stenting was indicated.  She will require subsequent ureteroscopy for management of the bilateral stones.  FINDINGS AND PROCEDURE:  She was given Rocephin in the emergency room. She was taken to the operating room where general anesthetic was induced.  She was placed in lithotomy position and fitted with PAS hose. Her perineum and genitalia were prepped with Betadine solution.  She was draped in usual sterile fashion.  Cystoscopy was performed using a 22- [place] scope and 12-degree lens.  Examination revealed a normal urethra. The bladder  wall had mild trabeculation but no mucosal lesions.  No tumors or stones were noted.  Ureteral orifices were unremarkable.  The right ureteral orifice was cannulated with a 5-French open-end catheter, and on fluoroscopy a column of contrast could be seen to the distal ureter.  A guidewire was passed through the open-end catheter to the kidney without difficulty and the open-end catheter was removed. Brisk efflux of turbid urine was noted from the right orifice along the wire.  A 6-French 24-cm double-J stent without string was then passed over the wire to the kidney.  The wire was removed leaving good coil in the kidney and good coil in the bladder.  I then passed a 5-French open-end catheter up on the left side to the level of the stone and initially would not go by the stone.  There was contrast in the kidney and above the stone.  I then passed a sensor guidewire and with some effort was able to get it by the stone into mid to lower pole calix.  The open-end catheter was then removed and a second 6-French 24-cm double-J stent was inserted over the wire.  I was able to negotiate this by the stone into the collecting system, although resistance was noted.  The wire was removed leaving good coil in the  kidney and good coil in the bladder.  I did not see much efflux from the left ureter, however, it had alongside the wire before placing the stent.  At this point, the patient's bladder was drained.  She was taken down from lithotomy position.  Her anesthetic was reversed.  She was moved to recovery room in stable condition.  There were no complications.  She will be set up for subsequent ureteroscopy and sent home with antibiotic coverage.     Marshall Cork. Jeffie Pollock, M.D.     JJW/MEDQ  D:  09/08/2014  T:  09/08/2014  Job:  [id number]  cc:   Lillette Boxer. Dahlstedt, M.D. Fax: [phone]

## 2014-09-08 NOTE — Progress Notes (Signed)
Patient able to void before discharge

## 2014-09-10 LAB — URINE CULTURE: Colony Count: 5000

## 2014-09-11 ENCOUNTER — Encounter (HOSPITAL_COMMUNITY): Payer: Self-pay | Admitting: Urology

## 2014-09-25 DIAGNOSIS — E538 Deficiency of other specified B group vitamins: Secondary | ICD-10-CM | POA: Diagnosis not present

## 2014-09-26 ENCOUNTER — Ambulatory Visit (INDEPENDENT_AMBULATORY_CARE_PROVIDER_SITE_OTHER): Payer: Medicare Other | Admitting: Urology

## 2014-09-26 DIAGNOSIS — N200 Calculus of kidney: Secondary | ICD-10-CM | POA: Diagnosis not present

## 2014-09-26 DIAGNOSIS — N201 Calculus of ureter: Secondary | ICD-10-CM

## 2014-10-06 DIAGNOSIS — Z23 Encounter for immunization: Secondary | ICD-10-CM | POA: Diagnosis not present

## 2014-10-30 DIAGNOSIS — E538 Deficiency of other specified B group vitamins: Secondary | ICD-10-CM | POA: Diagnosis not present

## 2014-10-31 DIAGNOSIS — N200 Calculus of kidney: Secondary | ICD-10-CM | POA: Insufficient documentation

## 2014-11-14 DIAGNOSIS — N200 Calculus of kidney: Secondary | ICD-10-CM | POA: Diagnosis not present

## 2014-11-27 DIAGNOSIS — Z01818 Encounter for other preprocedural examination: Secondary | ICD-10-CM | POA: Diagnosis not present

## 2014-11-27 DIAGNOSIS — M4185 Other forms of scoliosis, thoracolumbar region: Secondary | ICD-10-CM | POA: Diagnosis not present

## 2014-11-27 DIAGNOSIS — Z90710 Acquired absence of both cervix and uterus: Secondary | ICD-10-CM | POA: Diagnosis not present

## 2014-11-27 DIAGNOSIS — N132 Hydronephrosis with renal and ureteral calculous obstruction: Secondary | ICD-10-CM | POA: Diagnosis not present

## 2014-11-27 DIAGNOSIS — R918 Other nonspecific abnormal finding of lung field: Secondary | ICD-10-CM | POA: Diagnosis not present

## 2014-11-27 DIAGNOSIS — R030 Elevated blood-pressure reading, without diagnosis of hypertension: Secondary | ICD-10-CM | POA: Diagnosis not present

## 2014-11-27 DIAGNOSIS — I493 Ventricular premature depolarization: Secondary | ICD-10-CM | POA: Diagnosis not present

## 2014-11-27 DIAGNOSIS — K529 Noninfective gastroenteritis and colitis, unspecified: Secondary | ICD-10-CM | POA: Diagnosis not present

## 2014-11-27 DIAGNOSIS — Z87891 Personal history of nicotine dependence: Secondary | ICD-10-CM | POA: Diagnosis not present

## 2014-11-27 DIAGNOSIS — Z85118 Personal history of other malignant neoplasm of bronchus and lung: Secondary | ICD-10-CM | POA: Diagnosis not present

## 2014-12-05 DIAGNOSIS — E538 Deficiency of other specified B group vitamins: Secondary | ICD-10-CM | POA: Diagnosis not present

## 2014-12-07 DIAGNOSIS — N2882 Megaloureter: Secondary | ICD-10-CM | POA: Diagnosis not present

## 2014-12-07 DIAGNOSIS — Z85118 Personal history of other malignant neoplasm of bronchus and lung: Secondary | ICD-10-CM | POA: Diagnosis not present

## 2014-12-07 DIAGNOSIS — Z87891 Personal history of nicotine dependence: Secondary | ICD-10-CM | POA: Diagnosis not present

## 2014-12-07 DIAGNOSIS — N200 Calculus of kidney: Secondary | ICD-10-CM | POA: Diagnosis not present

## 2014-12-07 DIAGNOSIS — M1990 Unspecified osteoarthritis, unspecified site: Secondary | ICD-10-CM | POA: Diagnosis not present

## 2014-12-07 DIAGNOSIS — Z79899 Other long term (current) drug therapy: Secondary | ICD-10-CM | POA: Diagnosis not present

## 2014-12-07 DIAGNOSIS — N202 Calculus of kidney with calculus of ureter: Secondary | ICD-10-CM | POA: Diagnosis not present

## 2014-12-07 DIAGNOSIS — N2889 Other specified disorders of kidney and ureter: Secondary | ICD-10-CM | POA: Diagnosis not present

## 2014-12-07 DIAGNOSIS — Z87442 Personal history of urinary calculi: Secondary | ICD-10-CM | POA: Diagnosis not present

## 2014-12-08 DIAGNOSIS — N202 Calculus of kidney with calculus of ureter: Secondary | ICD-10-CM | POA: Diagnosis not present

## 2014-12-08 DIAGNOSIS — Z87442 Personal history of urinary calculi: Secondary | ICD-10-CM | POA: Diagnosis not present

## 2014-12-08 DIAGNOSIS — Z87891 Personal history of nicotine dependence: Secondary | ICD-10-CM | POA: Diagnosis not present

## 2014-12-08 DIAGNOSIS — M1990 Unspecified osteoarthritis, unspecified site: Secondary | ICD-10-CM | POA: Diagnosis not present

## 2014-12-08 DIAGNOSIS — N2889 Other specified disorders of kidney and ureter: Secondary | ICD-10-CM | POA: Diagnosis not present

## 2014-12-08 DIAGNOSIS — N132 Hydronephrosis with renal and ureteral calculous obstruction: Secondary | ICD-10-CM | POA: Diagnosis not present

## 2014-12-08 DIAGNOSIS — N2882 Megaloureter: Secondary | ICD-10-CM | POA: Diagnosis not present

## 2014-12-14 DIAGNOSIS — N200 Calculus of kidney: Secondary | ICD-10-CM | POA: Diagnosis not present

## 2014-12-26 ENCOUNTER — Other Ambulatory Visit (HOSPITAL_COMMUNITY): Payer: Self-pay | Admitting: Internal Medicine

## 2014-12-26 DIAGNOSIS — R918 Other nonspecific abnormal finding of lung field: Secondary | ICD-10-CM

## 2014-12-28 ENCOUNTER — Ambulatory Visit (HOSPITAL_COMMUNITY)
Admission: RE | Admit: 2014-12-28 | Discharge: 2014-12-28 | Disposition: A | Discharge status: Home / Self Care | Payer: Medicare Other | Source: Ambulatory Visit | Attending: Internal Medicine | Admitting: Internal Medicine

## 2014-12-28 DIAGNOSIS — N132 Hydronephrosis with renal and ureteral calculous obstruction: Secondary | ICD-10-CM | POA: Diagnosis not present

## 2014-12-28 DIAGNOSIS — R918 Other nonspecific abnormal finding of lung field: Secondary | ICD-10-CM

## 2014-12-28 DIAGNOSIS — Z85110 Personal history of malignant carcinoid tumor of bronchus and lung: Secondary | ICD-10-CM | POA: Diagnosis not present

## 2015-01-08 DIAGNOSIS — E538 Deficiency of other specified B group vitamins: Secondary | ICD-10-CM | POA: Diagnosis not present

## 2015-01-18 ENCOUNTER — Other Ambulatory Visit (HOSPITAL_COMMUNITY): Payer: Self-pay | Admitting: Internal Medicine

## 2015-01-18 ENCOUNTER — Ambulatory Visit (HOSPITAL_COMMUNITY)
Admission: RE | Admit: 2015-01-18 | Discharge: 2015-01-18 | Disposition: A | Discharge status: Home / Self Care | Payer: Medicare Other | Source: Ambulatory Visit | Attending: Internal Medicine | Admitting: Internal Medicine

## 2015-01-18 DIAGNOSIS — R059 Cough, unspecified: Secondary | ICD-10-CM

## 2015-01-18 DIAGNOSIS — R05 Cough: Secondary | ICD-10-CM | POA: Insufficient documentation

## 2015-01-18 DIAGNOSIS — Z85118 Personal history of other malignant neoplasm of bronchus and lung: Secondary | ICD-10-CM | POA: Diagnosis not present

## 2015-01-18 DIAGNOSIS — Z87891 Personal history of nicotine dependence: Secondary | ICD-10-CM | POA: Insufficient documentation

## 2015-01-30 DIAGNOSIS — N200 Calculus of kidney: Secondary | ICD-10-CM | POA: Diagnosis not present

## 2015-01-30 DIAGNOSIS — N281 Cyst of kidney, acquired: Secondary | ICD-10-CM | POA: Diagnosis not present

## 2015-01-30 DIAGNOSIS — R934 Abnormal findings on diagnostic imaging of urinary organs: Secondary | ICD-10-CM | POA: Diagnosis not present

## 2015-02-05 ENCOUNTER — Other Ambulatory Visit (HOSPITAL_COMMUNITY): Payer: Medicare Other

## 2015-02-05 ENCOUNTER — Encounter (HOSPITAL_COMMUNITY): Payer: Medicare Other

## 2015-02-07 DIAGNOSIS — E538 Deficiency of other specified B group vitamins: Secondary | ICD-10-CM | POA: Diagnosis not present

## 2015-03-12 DIAGNOSIS — E538 Deficiency of other specified B group vitamins: Secondary | ICD-10-CM | POA: Diagnosis not present

## 2015-04-12 DIAGNOSIS — E538 Deficiency of other specified B group vitamins: Secondary | ICD-10-CM | POA: Diagnosis not present

## 2015-05-14 DIAGNOSIS — E538 Deficiency of other specified B group vitamins: Secondary | ICD-10-CM | POA: Diagnosis not present

## 2015-05-22 ENCOUNTER — Other Ambulatory Visit: Payer: Self-pay

## 2015-05-22 ENCOUNTER — Other Ambulatory Visit (HOSPITAL_COMMUNITY): Payer: Self-pay | Admitting: Internal Medicine

## 2015-05-22 DIAGNOSIS — Z1231 Encounter for screening mammogram for malignant neoplasm of breast: Secondary | ICD-10-CM

## 2015-05-28 ENCOUNTER — Ambulatory Visit (HOSPITAL_COMMUNITY)
Admission: RE | Admit: 2015-05-28 | Discharge: 2015-05-28 | Disposition: A | Discharge status: Home / Self Care | Payer: Medicare Other | Source: Ambulatory Visit | Attending: Internal Medicine | Admitting: Internal Medicine

## 2015-05-28 DIAGNOSIS — Z1231 Encounter for screening mammogram for malignant neoplasm of breast: Secondary | ICD-10-CM

## 2015-05-29 ENCOUNTER — Ambulatory Visit: Payer: Medicare Other

## 2015-06-14 DIAGNOSIS — E538 Deficiency of other specified B group vitamins: Secondary | ICD-10-CM | POA: Diagnosis not present

## 2015-06-26 ENCOUNTER — Inpatient Hospital Stay (HOSPITAL_COMMUNITY): Admission: RE | Admit: 2015-06-26 | Payer: Self-pay | Source: Ambulatory Visit

## 2015-06-26 ENCOUNTER — Encounter (HOSPITAL_COMMUNITY)
Admission: RE | Admit: 2015-06-26 | Discharge: 2015-06-26 | Disposition: A | Discharge status: Home / Self Care | Payer: Medicare Other | Source: Ambulatory Visit | Attending: Internal Medicine | Admitting: Internal Medicine

## 2015-06-26 DIAGNOSIS — M810 Age-related osteoporosis without current pathological fracture: Secondary | ICD-10-CM | POA: Insufficient documentation

## 2015-06-26 MED ORDER — ZOLEDRONIC ACID 5 MG/100ML IV SOLN
INTRAVENOUS | Status: AC
  Filled 2015-06-26: qty 100

## 2015-06-26 MED ORDER — SODIUM CHLORIDE 0.9 % IV SOLN
Freq: Once | INTRAVENOUS | Status: AC
  Administered 2015-06-26: 250 mL via INTRAVENOUS

## 2015-06-26 MED ORDER — ZOLEDRONIC ACID 5 MG/100ML IV SOLN
5.0000 mg | Freq: Once | INTRAVENOUS | Status: AC
  Administered 2015-06-26: 5 mg via INTRAVENOUS

## 2015-06-26 NOTE — Progress Notes (Signed)
Here for reclast infusion. Dx osteoporosis. 

## 2015-07-16 DIAGNOSIS — E538 Deficiency of other specified B group vitamins: Secondary | ICD-10-CM | POA: Diagnosis not present

## 2015-07-24 DIAGNOSIS — N200 Calculus of kidney: Secondary | ICD-10-CM | POA: Diagnosis not present

## 2015-08-21 DIAGNOSIS — E538 Deficiency of other specified B group vitamins: Secondary | ICD-10-CM | POA: Diagnosis not present

## 2015-09-24 DIAGNOSIS — E538 Deficiency of other specified B group vitamins: Secondary | ICD-10-CM | POA: Diagnosis not present

## 2015-09-25 DIAGNOSIS — N200 Calculus of kidney: Secondary | ICD-10-CM | POA: Diagnosis not present

## 2015-10-29 DIAGNOSIS — E538 Deficiency of other specified B group vitamins: Secondary | ICD-10-CM | POA: Diagnosis not present

## 2015-11-29 DIAGNOSIS — E538 Deficiency of other specified B group vitamins: Secondary | ICD-10-CM | POA: Diagnosis not present

## 2015-12-04 DIAGNOSIS — H52223 Regular astigmatism, bilateral: Secondary | ICD-10-CM | POA: Diagnosis not present

## 2015-12-04 DIAGNOSIS — H524 Presbyopia: Secondary | ICD-10-CM | POA: Diagnosis not present

## 2015-12-04 DIAGNOSIS — H43813 Vitreous degeneration, bilateral: Secondary | ICD-10-CM | POA: Diagnosis not present

## 2015-12-04 DIAGNOSIS — H5203 Hypermetropia, bilateral: Secondary | ICD-10-CM | POA: Diagnosis not present

## 2015-12-31 DIAGNOSIS — E538 Deficiency of other specified B group vitamins: Secondary | ICD-10-CM | POA: Diagnosis not present

## 2016-01-31 DIAGNOSIS — E538 Deficiency of other specified B group vitamins: Secondary | ICD-10-CM | POA: Diagnosis not present

## 2016-03-03 DIAGNOSIS — E538 Deficiency of other specified B group vitamins: Secondary | ICD-10-CM | POA: Diagnosis not present

## 2016-04-14 DIAGNOSIS — E538 Deficiency of other specified B group vitamins: Secondary | ICD-10-CM | POA: Diagnosis not present

## 2016-05-16 DIAGNOSIS — K130 Diseases of lips: Secondary | ICD-10-CM | POA: Diagnosis not present

## 2016-05-26 DIAGNOSIS — E538 Deficiency of other specified B group vitamins: Secondary | ICD-10-CM | POA: Diagnosis not present

## 2016-06-09 ENCOUNTER — Other Ambulatory Visit (HOSPITAL_COMMUNITY): Payer: Self-pay | Admitting: Internal Medicine

## 2016-06-09 ENCOUNTER — Ambulatory Visit (HOSPITAL_COMMUNITY)
Admission: RE | Admit: 2016-06-09 | Discharge: 2016-06-09 | Disposition: A | Discharge status: Home / Self Care | Payer: Medicare Other | Source: Ambulatory Visit | Attending: Internal Medicine | Admitting: Internal Medicine

## 2016-06-09 DIAGNOSIS — Z1231 Encounter for screening mammogram for malignant neoplasm of breast: Secondary | ICD-10-CM | POA: Diagnosis present

## 2016-06-26 DIAGNOSIS — Z79899 Other long term (current) drug therapy: Secondary | ICD-10-CM | POA: Diagnosis not present

## 2016-06-26 DIAGNOSIS — E039 Hypothyroidism, unspecified: Secondary | ICD-10-CM | POA: Diagnosis not present

## 2016-06-26 DIAGNOSIS — K769 Liver disease, unspecified: Secondary | ICD-10-CM | POA: Diagnosis not present

## 2016-06-26 DIAGNOSIS — D519 Vitamin B12 deficiency anemia, unspecified: Secondary | ICD-10-CM | POA: Diagnosis not present

## 2016-06-26 DIAGNOSIS — M810 Age-related osteoporosis without current pathological fracture: Secondary | ICD-10-CM | POA: Diagnosis not present

## 2016-06-27 DIAGNOSIS — E538 Deficiency of other specified B group vitamins: Secondary | ICD-10-CM | POA: Diagnosis not present

## 2016-07-03 DIAGNOSIS — R195 Other fecal abnormalities: Secondary | ICD-10-CM | POA: Diagnosis not present

## 2016-07-03 DIAGNOSIS — Z681 Body mass index (BMI) 19 or less, adult: Secondary | ICD-10-CM | POA: Diagnosis not present

## 2016-07-03 DIAGNOSIS — R945 Abnormal results of liver function studies: Secondary | ICD-10-CM | POA: Diagnosis not present

## 2016-07-03 DIAGNOSIS — D692 Other nonthrombocytopenic purpura: Secondary | ICD-10-CM | POA: Diagnosis not present

## 2016-07-03 DIAGNOSIS — D696 Thrombocytopenia, unspecified: Secondary | ICD-10-CM | POA: Diagnosis not present

## 2016-07-08 ENCOUNTER — Other Ambulatory Visit (HOSPITAL_COMMUNITY): Payer: Self-pay | Admitting: Internal Medicine

## 2016-07-08 DIAGNOSIS — D696 Thrombocytopenia, unspecified: Secondary | ICD-10-CM

## 2016-07-08 DIAGNOSIS — R945 Abnormal results of liver function studies: Secondary | ICD-10-CM

## 2016-07-18 ENCOUNTER — Ambulatory Visit (HOSPITAL_COMMUNITY)
Admission: RE | Admit: 2016-07-18 | Discharge: 2016-07-18 | Disposition: A | Discharge status: Home / Self Care | Payer: Medicare Other | Source: Ambulatory Visit | Attending: Internal Medicine | Admitting: Internal Medicine

## 2016-07-18 DIAGNOSIS — R945 Abnormal results of liver function studies: Secondary | ICD-10-CM | POA: Insufficient documentation

## 2016-07-18 DIAGNOSIS — D696 Thrombocytopenia, unspecified: Secondary | ICD-10-CM | POA: Diagnosis not present

## 2016-07-18 DIAGNOSIS — N281 Cyst of kidney, acquired: Secondary | ICD-10-CM | POA: Insufficient documentation

## 2016-07-18 MED ORDER — IOPAMIDOL (ISOVUE-300) INJECTION 61%
100.0000 mL | Freq: Once | INTRAVENOUS | Status: AC | PRN
  Administered 2016-07-18: 100 mL via INTRAVENOUS

## 2016-07-29 DIAGNOSIS — R945 Abnormal results of liver function studies: Secondary | ICD-10-CM | POA: Diagnosis not present

## 2016-07-29 DIAGNOSIS — E538 Deficiency of other specified B group vitamins: Secondary | ICD-10-CM | POA: Diagnosis not present

## 2016-09-01 DIAGNOSIS — E538 Deficiency of other specified B group vitamins: Secondary | ICD-10-CM | POA: Diagnosis not present

## 2016-09-02 DIAGNOSIS — M25512 Pain in left shoulder: Secondary | ICD-10-CM | POA: Diagnosis not present

## 2016-09-16 DIAGNOSIS — N200 Calculus of kidney: Secondary | ICD-10-CM | POA: Diagnosis not present

## 2016-09-17 DIAGNOSIS — J029 Acute pharyngitis, unspecified: Secondary | ICD-10-CM | POA: Diagnosis not present

## 2016-10-06 DIAGNOSIS — E538 Deficiency of other specified B group vitamins: Secondary | ICD-10-CM | POA: Diagnosis not present

## 2016-10-21 DIAGNOSIS — N200 Calculus of kidney: Secondary | ICD-10-CM | POA: Diagnosis not present

## 2016-11-13 DIAGNOSIS — E538 Deficiency of other specified B group vitamins: Secondary | ICD-10-CM | POA: Diagnosis not present

## 2016-11-17 DIAGNOSIS — H01005 Unspecified blepharitis left lower eyelid: Secondary | ICD-10-CM | POA: Diagnosis not present

## 2016-12-04 DIAGNOSIS — H5203 Hypermetropia, bilateral: Secondary | ICD-10-CM | POA: Diagnosis not present

## 2016-12-04 DIAGNOSIS — H25811 Combined forms of age-related cataract, right eye: Secondary | ICD-10-CM | POA: Diagnosis not present

## 2016-12-04 DIAGNOSIS — H524 Presbyopia: Secondary | ICD-10-CM | POA: Diagnosis not present

## 2016-12-04 DIAGNOSIS — H52223 Regular astigmatism, bilateral: Secondary | ICD-10-CM | POA: Diagnosis not present

## 2016-12-16 DIAGNOSIS — E538 Deficiency of other specified B group vitamins: Secondary | ICD-10-CM | POA: Diagnosis not present

## 2017-01-23 DIAGNOSIS — E538 Deficiency of other specified B group vitamins: Secondary | ICD-10-CM | POA: Diagnosis not present

## 2017-03-05 DIAGNOSIS — E538 Deficiency of other specified B group vitamins: Secondary | ICD-10-CM | POA: Diagnosis not present

## 2017-04-07 DIAGNOSIS — E538 Deficiency of other specified B group vitamins: Secondary | ICD-10-CM | POA: Diagnosis not present

## 2017-05-05 ENCOUNTER — Other Ambulatory Visit (HOSPITAL_COMMUNITY): Payer: Self-pay | Admitting: Internal Medicine

## 2017-05-05 DIAGNOSIS — Z1231 Encounter for screening mammogram for malignant neoplasm of breast: Secondary | ICD-10-CM

## 2017-05-07 DIAGNOSIS — E538 Deficiency of other specified B group vitamins: Secondary | ICD-10-CM | POA: Diagnosis not present

## 2017-06-11 ENCOUNTER — Ambulatory Visit (HOSPITAL_COMMUNITY)
Admission: RE | Admit: 2017-06-11 | Discharge: 2017-06-11 | Disposition: A | Discharge status: Home / Self Care | Payer: Medicare Other | Source: Ambulatory Visit | Attending: Internal Medicine | Admitting: Internal Medicine

## 2017-06-11 DIAGNOSIS — Z1231 Encounter for screening mammogram for malignant neoplasm of breast: Secondary | ICD-10-CM | POA: Diagnosis not present

## 2017-06-15 DIAGNOSIS — E538 Deficiency of other specified B group vitamins: Secondary | ICD-10-CM | POA: Diagnosis not present

## 2017-07-13 DIAGNOSIS — D696 Thrombocytopenia, unspecified: Secondary | ICD-10-CM | POA: Diagnosis not present

## 2017-07-13 DIAGNOSIS — K769 Liver disease, unspecified: Secondary | ICD-10-CM | POA: Diagnosis not present

## 2017-07-13 DIAGNOSIS — Z79899 Other long term (current) drug therapy: Secondary | ICD-10-CM | POA: Diagnosis not present

## 2017-07-13 DIAGNOSIS — E039 Hypothyroidism, unspecified: Secondary | ICD-10-CM | POA: Diagnosis not present

## 2017-07-13 DIAGNOSIS — R945 Abnormal results of liver function studies: Secondary | ICD-10-CM | POA: Diagnosis not present

## 2017-07-13 DIAGNOSIS — M810 Age-related osteoporosis without current pathological fracture: Secondary | ICD-10-CM | POA: Diagnosis not present

## 2017-07-13 DIAGNOSIS — D519 Vitamin B12 deficiency anemia, unspecified: Secondary | ICD-10-CM | POA: Diagnosis not present

## 2017-07-16 DIAGNOSIS — E538 Deficiency of other specified B group vitamins: Secondary | ICD-10-CM | POA: Diagnosis not present

## 2017-08-11 DIAGNOSIS — D696 Thrombocytopenia, unspecified: Secondary | ICD-10-CM | POA: Diagnosis not present

## 2017-08-27 ENCOUNTER — Other Ambulatory Visit (HOSPITAL_COMMUNITY): Payer: Self-pay | Admitting: Internal Medicine

## 2017-08-27 DIAGNOSIS — Z780 Asymptomatic menopausal state: Secondary | ICD-10-CM

## 2017-09-03 ENCOUNTER — Other Ambulatory Visit (HOSPITAL_COMMUNITY): Payer: Medicare Other

## 2017-09-08 ENCOUNTER — Ambulatory Visit (HOSPITAL_COMMUNITY)
Admission: RE | Admit: 2017-09-08 | Discharge: 2017-09-08 | Disposition: A | Discharge status: Home / Self Care | Payer: Medicare Other | Source: Ambulatory Visit | Attending: Internal Medicine | Admitting: Internal Medicine

## 2017-09-08 DIAGNOSIS — Z780 Asymptomatic menopausal state: Secondary | ICD-10-CM | POA: Diagnosis not present

## 2017-09-08 DIAGNOSIS — M810 Age-related osteoporosis without current pathological fracture: Secondary | ICD-10-CM | POA: Insufficient documentation

## 2017-09-22 DIAGNOSIS — D696 Thrombocytopenia, unspecified: Secondary | ICD-10-CM | POA: Diagnosis not present

## 2017-09-22 DIAGNOSIS — E538 Deficiency of other specified B group vitamins: Secondary | ICD-10-CM | POA: Diagnosis not present

## 2017-10-13 DIAGNOSIS — R197 Diarrhea, unspecified: Secondary | ICD-10-CM | POA: Diagnosis not present

## 2017-10-30 DIAGNOSIS — E538 Deficiency of other specified B group vitamins: Secondary | ICD-10-CM | POA: Diagnosis not present

## 2017-12-07 DIAGNOSIS — E538 Deficiency of other specified B group vitamins: Secondary | ICD-10-CM | POA: Diagnosis not present

## 2017-12-09 DIAGNOSIS — R197 Diarrhea, unspecified: Secondary | ICD-10-CM | POA: Diagnosis not present

## 2017-12-22 DIAGNOSIS — R945 Abnormal results of liver function studies: Secondary | ICD-10-CM | POA: Diagnosis not present

## 2017-12-22 DIAGNOSIS — D696 Thrombocytopenia, unspecified: Secondary | ICD-10-CM | POA: Diagnosis not present

## 2017-12-29 DIAGNOSIS — M810 Age-related osteoporosis without current pathological fracture: Secondary | ICD-10-CM | POA: Diagnosis not present

## 2017-12-29 DIAGNOSIS — K7010 Alcoholic hepatitis without ascites: Secondary | ICD-10-CM | POA: Diagnosis not present

## 2017-12-29 DIAGNOSIS — Z681 Body mass index (BMI) 19 or less, adult: Secondary | ICD-10-CM | POA: Diagnosis not present

## 2017-12-29 DIAGNOSIS — D696 Thrombocytopenia, unspecified: Secondary | ICD-10-CM | POA: Diagnosis not present

## 2018-01-08 DIAGNOSIS — D538 Other specified nutritional anemias: Secondary | ICD-10-CM | POA: Diagnosis not present

## 2018-01-26 DIAGNOSIS — D696 Thrombocytopenia, unspecified: Secondary | ICD-10-CM | POA: Diagnosis not present

## 2018-01-26 DIAGNOSIS — K7010 Alcoholic hepatitis without ascites: Secondary | ICD-10-CM | POA: Diagnosis not present

## 2018-01-26 DIAGNOSIS — Z79899 Other long term (current) drug therapy: Secondary | ICD-10-CM | POA: Diagnosis not present

## 2018-01-26 DIAGNOSIS — M810 Age-related osteoporosis without current pathological fracture: Secondary | ICD-10-CM | POA: Diagnosis not present

## 2018-02-02 DIAGNOSIS — D696 Thrombocytopenia, unspecified: Secondary | ICD-10-CM | POA: Diagnosis not present

## 2018-02-02 DIAGNOSIS — K7010 Alcoholic hepatitis without ascites: Secondary | ICD-10-CM | POA: Diagnosis not present

## 2018-02-08 DIAGNOSIS — R197 Diarrhea, unspecified: Secondary | ICD-10-CM | POA: Diagnosis not present

## 2018-02-16 DIAGNOSIS — E539 Vitamin B deficiency, unspecified: Secondary | ICD-10-CM | POA: Diagnosis not present

## 2018-02-19 DIAGNOSIS — H5203 Hypermetropia, bilateral: Secondary | ICD-10-CM | POA: Diagnosis not present

## 2018-02-19 DIAGNOSIS — H524 Presbyopia: Secondary | ICD-10-CM | POA: Diagnosis not present

## 2018-02-19 DIAGNOSIS — H52223 Regular astigmatism, bilateral: Secondary | ICD-10-CM | POA: Diagnosis not present

## 2018-02-19 DIAGNOSIS — H25811 Combined forms of age-related cataract, right eye: Secondary | ICD-10-CM | POA: Diagnosis not present

## 2018-02-28 HISTORY — PX: CYSTOSCOPY W/ RETROGRADES: SHX1426

## 2018-03-29 NOTE — Discharge Instructions (Signed)
Denosumab injection °What is this medicine? °DENOSUMAB (den oh sue mab) slows bone breakdown. Prolia is used to treat osteoporosis in women after menopause and in men. Xgeva is used to treat a high calcium level due to cancer and to prevent bone fractures and other bone problems caused by multiple myeloma or cancer bone metastases. Xgeva is also used to treat giant cell tumor of the bone. °This medicine may be used for other purposes; ask your health care provider or pharmacist if you have questions. °COMMON BRAND NAME(S): Prolia, XGEVA °What should I tell my health care provider before I take this medicine? °They need to know if you have any of these conditions: °-dental disease °-having surgery or tooth extraction °-infection °-kidney disease °-low levels of calcium or Vitamin D in the blood °-malnutrition °-on hemodialysis °-skin conditions or sensitivity °-thyroid or parathyroid disease °-an unusual reaction to denosumab, other medicines, foods, dyes, or preservatives °-pregnant or trying to get pregnant °-breast-feeding °How should I use this medicine? °This medicine is for injection under the skin. It is given by a health care professional in a hospital or clinic setting. °If you are getting Prolia, a special MedGuide will be given to you by the pharmacist with each prescription and refill. Be sure to read this information carefully each time. °For Prolia, talk to your pediatrician regarding the use of this medicine in children. Special care may be needed. For Xgeva, talk to your pediatrician regarding the use of this medicine in children. While this drug may be prescribed for children as young as 13 years for selected conditions, precautions do apply. °Overdosage: If you think you have taken too much of this medicine contact a poison control center or emergency room at once. °NOTE: This medicine is only for you. Do not share this medicine with others. °What if I miss a dose? °It is important not to miss your  dose. Call your doctor or health care professional if you are unable to keep an appointment. °What may interact with this medicine? °Do not take this medicine with any of the following medications: °-other medicines containing denosumab °This medicine may also interact with the following medications: °-medicines that lower your chance of fighting infection °-steroid medicines like prednisone or cortisone °This list may not describe all possible interactions. Give your health care provider a list of all the medicines, herbs, non-prescription drugs, or dietary supplements you use. Also tell them if you smoke, drink alcohol, or use illegal drugs. Some items may interact with your medicine. °What should I watch for while using this medicine? °Visit your doctor or health care professional for regular checks on your progress. Your doctor or health care professional may order blood tests and other tests to see how you are doing. °Call your doctor or health care professional for advice if you get a fever, chills or sore throat, or other symptoms of a cold or flu. Do not treat yourself. This drug may decrease your body's ability to fight infection. Try to avoid being around people who are sick. °You should make sure you get enough calcium and vitamin D while you are taking this medicine, unless your doctor tells you not to. Discuss the foods you eat and the vitamins you take with your health care professional. °See your dentist regularly. Brush and floss your teeth as directed. Before you have any dental work done, tell your dentist you are receiving this medicine. °Do not become pregnant while taking this medicine or for 5 months after stopping   it. Talk with your doctor or health care professional about your birth control options while taking this medicine. Women should inform their doctor if they wish to become pregnant or think they might be pregnant. There is a potential for serious side effects to an unborn child. Talk  to your health care professional or pharmacist for more information. What side effects may I notice from receiving this medicine? Side effects that you should report to your doctor or health care professional as soon as possible: -allergic reactions like skin rash, itching or hives, swelling of the face, lips, or tongue -bone pain -breathing problems -dizziness -jaw pain, especially after dental work -redness, blistering, peeling of the skin -signs and symptoms of infection like fever or chills; cough; sore throat; pain or trouble passing urine -signs of low calcium like fast heartbeat, muscle cramps or muscle pain; pain, tingling, numbness in the hands or feet; seizures -unusual bleeding or bruising -unusually weak or tired Side effects that usually do not require medical attention (report to your doctor or health care professional if they continue or are bothersome): -constipation -diarrhea -headache -joint pain -loss of appetite -muscle pain -runny nose -tiredness -upset stomach This list may not describe all possible side effects. Call your doctor for medical advice about side effects. You may report side effects to FDA at 1-800-FDA-1088. Where should I keep my medicine? This medicine is only given in a clinic, doctor's office, or other health care setting and will not be stored at home. NOTE: This sheet is a summary. It may not cover all possible information. If you have questions about this medicine, talk to your doctor, pharmacist, or health care provider.  2018 Elsevier/Gold Standard (2016-12-09 19:17:21)

## 2018-03-30 ENCOUNTER — Encounter (HOSPITAL_COMMUNITY)
Admission: RE | Admit: 2018-03-30 | Discharge: 2018-03-30 | Disposition: A | Discharge status: Home / Self Care | Payer: Medicare Other | Source: Ambulatory Visit | Attending: Internal Medicine | Admitting: Internal Medicine

## 2018-03-30 DIAGNOSIS — M810 Age-related osteoporosis without current pathological fracture: Secondary | ICD-10-CM | POA: Diagnosis not present

## 2018-03-30 MED ORDER — DENOSUMAB 60 MG/ML ~~LOC~~ SOSY
60.0000 mg | PREFILLED_SYRINGE | Freq: Once | SUBCUTANEOUS | Status: AC
  Administered 2018-03-30: 60 mg via SUBCUTANEOUS
  Filled 2018-03-30: qty 1

## 2018-05-07 ENCOUNTER — Other Ambulatory Visit (HOSPITAL_COMMUNITY): Payer: Self-pay | Admitting: Internal Medicine

## 2018-05-07 DIAGNOSIS — Z1231 Encounter for screening mammogram for malignant neoplasm of breast: Secondary | ICD-10-CM

## 2018-05-11 DIAGNOSIS — Z79899 Other long term (current) drug therapy: Secondary | ICD-10-CM | POA: Diagnosis not present

## 2018-05-11 DIAGNOSIS — D696 Thrombocytopenia, unspecified: Secondary | ICD-10-CM | POA: Diagnosis not present

## 2018-05-11 DIAGNOSIS — K591 Functional diarrhea: Secondary | ICD-10-CM | POA: Diagnosis not present

## 2018-05-11 DIAGNOSIS — K7010 Alcoholic hepatitis without ascites: Secondary | ICD-10-CM | POA: Diagnosis not present

## 2018-05-18 DIAGNOSIS — M810 Age-related osteoporosis without current pathological fracture: Secondary | ICD-10-CM | POA: Diagnosis not present

## 2018-05-18 DIAGNOSIS — K7010 Alcoholic hepatitis without ascites: Secondary | ICD-10-CM | POA: Diagnosis not present

## 2018-05-31 DIAGNOSIS — R918 Other nonspecific abnormal finding of lung field: Secondary | ICD-10-CM | POA: Diagnosis not present

## 2018-05-31 DIAGNOSIS — E860 Dehydration: Secondary | ICD-10-CM | POA: Diagnosis not present

## 2018-05-31 DIAGNOSIS — R945 Abnormal results of liver function studies: Secondary | ICD-10-CM | POA: Diagnosis not present

## 2018-05-31 DIAGNOSIS — R000 Tachycardia, unspecified: Secondary | ICD-10-CM | POA: Diagnosis not present

## 2018-05-31 DIAGNOSIS — J984 Other disorders of lung: Secondary | ICD-10-CM | POA: Diagnosis not present

## 2018-05-31 DIAGNOSIS — R42 Dizziness and giddiness: Secondary | ICD-10-CM | POA: Diagnosis not present

## 2018-06-01 DIAGNOSIS — R918 Other nonspecific abnormal finding of lung field: Secondary | ICD-10-CM | POA: Diagnosis not present

## 2018-06-01 DIAGNOSIS — R42 Dizziness and giddiness: Secondary | ICD-10-CM | POA: Diagnosis not present

## 2018-06-01 DIAGNOSIS — J984 Other disorders of lung: Secondary | ICD-10-CM | POA: Diagnosis not present

## 2018-06-17 ENCOUNTER — Ambulatory Visit (HOSPITAL_COMMUNITY): Payer: Medicare Other

## 2018-07-20 ENCOUNTER — Emergency Department (HOSPITAL_COMMUNITY)
Admission: EM | Admit: 2018-07-20 | Discharge: 2018-07-20 | Disposition: A | Discharge status: Home / Self Care | Payer: Medicare Other | Attending: Emergency Medicine | Admitting: Emergency Medicine

## 2018-07-20 ENCOUNTER — Other Ambulatory Visit: Payer: Self-pay

## 2018-07-20 ENCOUNTER — Encounter (HOSPITAL_COMMUNITY): Payer: Self-pay | Admitting: Emergency Medicine

## 2018-07-20 DIAGNOSIS — Z87891 Personal history of nicotine dependence: Secondary | ICD-10-CM | POA: Insufficient documentation

## 2018-07-20 DIAGNOSIS — R2681 Unsteadiness on feet: Secondary | ICD-10-CM | POA: Insufficient documentation

## 2018-07-20 DIAGNOSIS — F1010 Alcohol abuse, uncomplicated: Secondary | ICD-10-CM

## 2018-07-20 DIAGNOSIS — Z79899 Other long term (current) drug therapy: Secondary | ICD-10-CM | POA: Diagnosis not present

## 2018-07-20 DIAGNOSIS — F10920 Alcohol use, unspecified with intoxication, uncomplicated: Secondary | ICD-10-CM

## 2018-07-20 DIAGNOSIS — F10129 Alcohol abuse with intoxication, unspecified: Secondary | ICD-10-CM | POA: Diagnosis present

## 2018-07-20 DIAGNOSIS — E46 Unspecified protein-calorie malnutrition: Secondary | ICD-10-CM

## 2018-07-20 HISTORY — DX: Liver disease, unspecified: K76.9

## 2018-07-20 LAB — CBC
HCT: 39.2 % (ref 36.0–46.0)
Hemoglobin: 13.1 g/dL (ref 12.0–15.0)
MCH: 33.4 pg (ref 26.0–34.0)
MCHC: 33.4 g/dL (ref 30.0–36.0)
MCV: 100 fL (ref 78.0–100.0)
Platelets: 95 10*3/uL — ABNORMAL LOW (ref 150–400)
RBC: 3.92 MIL/uL (ref 3.87–5.11)
RDW: 14.1 % (ref 11.5–15.5)
WBC: 11.5 10*3/uL — ABNORMAL HIGH (ref 4.0–10.5)

## 2018-07-20 LAB — COMPREHENSIVE METABOLIC PANEL
ALT: 86 U/L — ABNORMAL HIGH (ref 0–44)
AST: 183 U/L — ABNORMAL HIGH (ref 15–41)
Albumin: 3.9 g/dL (ref 3.5–5.0)
Alkaline Phosphatase: 282 U/L — ABNORMAL HIGH (ref 38–126)
Anion gap: 14 (ref 5–15)
BUN: 10 mg/dL (ref 8–23)
CO2: 23 mmol/L (ref 22–32)
Calcium: 8.5 mg/dL — ABNORMAL LOW (ref 8.9–10.3)
Chloride: 102 mmol/L (ref 98–111)
Creatinine, Ser: 0.78 mg/dL (ref 0.44–1.00)
GFR calc Af Amer: >60 mL/min (ref >60)
GFR calc non Af Amer: >60 mL/min (ref >60)
Glucose, Bld: 145 mg/dL — ABNORMAL HIGH (ref 70–99)
Potassium: 4.2 mmol/L (ref 3.5–5.1)
Sodium: 139 mmol/L (ref 135–145)
Total Bilirubin: 1 mg/dL (ref 0.3–1.2)
Total Protein: 6.9 g/dL (ref 6.5–8.1)

## 2018-07-20 LAB — ETHANOL: Alcohol, Ethyl (B): 121 mg/dL — ABNORMAL HIGH (ref <10)

## 2018-07-20 MED ORDER — VITAMIN B-1 100 MG PO TABS: 100.0000 mg | ORAL_TABLET | Freq: Every day | ORAL | Status: DC

## 2018-07-20 MED ORDER — LORAZEPAM 2 MG/ML IJ SOLN: 0.0000 mg | Freq: Two times a day (BID) | INTRAMUSCULAR | Status: DC

## 2018-07-20 MED ORDER — LORAZEPAM 1 MG PO TABS: 0.0000 mg | ORAL_TABLET | Freq: Four times a day (QID) | ORAL | Status: DC

## 2018-07-20 MED ORDER — LORAZEPAM 2 MG/ML IJ SOLN: 0.0000 mg | Freq: Four times a day (QID) | INTRAMUSCULAR | Status: DC

## 2018-07-20 MED ORDER — SODIUM CHLORIDE 0.9 % IV BOLUS
1000.0000 mL | Freq: Once | INTRAVENOUS | Status: AC
  Administered 2018-07-20: 1000 mL via INTRAVENOUS

## 2018-07-20 MED ORDER — ONDANSETRON HCL 4 MG/2ML IJ SOLN
4.0000 mg | Freq: Once | INTRAMUSCULAR | Status: AC
  Administered 2018-07-20: 4 mg via INTRAVENOUS
  Filled 2018-07-20: qty 2

## 2018-07-20 MED ORDER — LORAZEPAM 1 MG PO TABS: 0.0000 mg | ORAL_TABLET | Freq: Two times a day (BID) | ORAL | Status: DC

## 2018-07-20 MED ORDER — CHLORDIAZEPOXIDE HCL 25 MG PO CAPS: ORAL_CAPSULE | ORAL | 0 refills | Status: DC

## 2018-07-20 MED ORDER — LORAZEPAM 1 MG PO TABS: 2.0000 mg | ORAL_TABLET | Freq: Once | ORAL | Status: DC

## 2018-07-20 MED ORDER — THIAMINE HCL 100 MG/ML IJ SOLN
100.0000 mg | Freq: Every day | INTRAMUSCULAR | Status: DC
  Administered 2018-07-20: 100 mg via INTRAVENOUS
  Filled 2018-07-20: qty 2

## 2018-07-20 NOTE — Progress Notes (Signed)
Patient seen and evaluated in the ED as requested by ED physician.  She is noted to have increase in alcohol consumption over the last week with boxed wine.  She came to the ED with alcohol intoxication and some falls at home.  She denies any pain or other symptomatic complaints.  She is otherwise alert and oriented x3 and appears to be more or less sober at this point.  She has some difficulty with ambulation as a result of her alcohol intoxication and has some frailty due to her age.  Her daughter is at the bedside and agrees to stay with her tonight and monitor her.  She does not have any withdrawal symptoms noted at this time.  She will be given information about alcohol detox programs as well as Librium.    Discussed case with Dr. Melina Copa in the ED about discharge to home with no indication for hospital admission at this time.

## 2018-07-20 NOTE — Discharge Instructions (Signed)
Your evaluated in the emergency department for weakness, frequent falls, and alcohol abuse.  You had some lab work that showed the alcohol was affecting your liver.  You were offered admission and you declined.  We are sending you home with a prescription for Librium which may help limit your withdrawal symptoms.  We do recommend that you contact the detox facility to get help with stopping drinking.  Please follow-up with your doctor and return if any worsening symptoms.

## 2018-07-20 NOTE — ED Triage Notes (Signed)
Per family pt is going through alcohol withdrawal. Per family pt is drinking 10 boxes of wine over the past week. When asked pt why she is here she states she is drinking a lot, family made her drink some wine PTA.

## 2018-07-20 NOTE — ED Provider Notes (Signed)
[facility] EMERGENCY DEPARTMENT Provider Note   CSN: [id number] Arrival date & time: 07/20/18  1939     History   Chief Complaint Chief Complaint  Patient presents with  . Alcohol Intoxication    HPI [name] is a 77 y.o. female.  She is brought in by her daughter after an increase in drinking over the past week.  She usually has a box of wine a day but is drinking more lately.  She is not eating.  There is been falls before and over the weekend she has been more shaking in her legs.  She fell again today and could not get up off the floor for a few hours.  The patient herself denies any withdrawal symptoms but family has noticed tremulousness.  No history of seizures.  Patient states she is nauseous.  She is thought about detox before but is never gone.  Today she is interested in detox.  The history is provided by the patient.  Alcohol Problem  This is a chronic problem. The current episode started more than 1 week ago. The problem occurs constantly. The problem has not changed since onset.Pertinent negatives include no chest pain, no abdominal pain, no headaches and no shortness of breath. Nothing aggravates the symptoms. The symptoms are relieved by drinking. She has tried nothing for the symptoms. The treatment provided no relief.    Past Medical History:  Diagnosis Date  . Anxiety   . Arthritis   . 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 Carpal tunnel syndrome, bilateral   . Depression   . GERD (gastroesophageal reflux disease)   . Kidney stones   . Liver disease   . Osteoporosis     Patient Active Problem List   Diagnosis Date Noted  . Left ureteral stone 09/08/2014  . Right ureteral stone 09/08/2014  . Infection of urinary tract 09/08/2014  . Pain in joint, shoulder region 08/09/2012  . Muscle weakness (generalized) 08/09/2012  . Impingement syndrome of left shoulder 08/09/2012    Past Surgical History:  Procedure Laterality Date  .  ABDOMINAL HYSTERECTOMY     vaginal  . APPENDECTOMY    . CARPAL TUNNEL RELEASE  01/21/2012   Procedure: CARPAL TUNNEL RELEASE;  Surgeon: Wynonia Sours, MD;  Location: [place];  Service: Orthopedics;  Laterality: Right;  . COLON RESECTION  2004   perf bowel after colonoscopy  . CYSTOSCOPY W/ URETERAL STENT PLACEMENT  2010   lt-lazer stone  . CYSTOSCOPY WITH STENT PLACEMENT Bilateral 09/08/2014   Procedure: CYSTOSCOPY WITH STENT PLACEMENT;  Surgeon: Malka So, MD;  Location: [facility];  Service: Urology;  Laterality: Bilateral;  . THORACOTOMY  2007   vatz-rt upper lobe  . TONSILLECTOMY       OB History   None      Home Medications    Prior to Admission medications   Medication Sig Start Date End Date Taking? Authorizing Provider  Biotin 2.5 MG TABS Take by mouth daily.    [provider]  ciprofloxacin (CIPRO) 500 MG tablet Take 1 tablet (500 mg total) by mouth 2 (two) times daily. Start day before f/u visit 09/08/14   Irine Seal, MD  Cyanocobalamin (VITAMIN B-12 IJ) Inject as directed every 30 (thirty) days.    [provider]  cycloSPORINE (RESTASIS) 0.05 % ophthalmic emulsion Place 1 drop into both eyes 2 (two) times daily.     [provider]  glucosamine-chondroitin 500-400 MG tablet  Take 1 tablet by mouth daily.    [provider]  meloxicam (MOBIC) 15 MG tablet Take 15 mg by mouth daily as needed for pain.    [provider]  Multiple Vitamin (MULTIVITAMIN) capsule Take 1 capsule by mouth daily.    [provider]  oxyCODONE-acetaminophen (ROXICET) 5-325 MG per tablet Take 1 tablet by mouth every 6 (six) hours as needed for severe pain. 09/08/14   Irine Seal, MD  phenazopyridine (PYRIDIUM) 200 MG tablet Take 1 tablet (200 mg total) by mouth 3 (three) times daily as needed for pain. 09/08/14   Irine Seal, MD  potassium citrate (UROCIT-K) 10 MEQ (1080 MG) SR tablet Take 10 mEq by mouth 1 day or 1 dose.     [provider]  Probiotic Product (ALIGN PO) Take 1 capsule by mouth daily.    [provider]  traMADol (ULTRAM) 50 MG tablet Take 1 tablet (50 mg total) by mouth every 6 (six) hours as needed. 09/08/14   Irine Seal, MD    Family History History reviewed. No pertinent family history.  Social History Social History   Tobacco Use  . Smoking status: Former Smoker    Types: Cigarettes    Last attempt to quit: 12/01/1996    Years since quitting: 21.6  . Smokeless tobacco: Never Used  Substance Use Topics  . Alcohol use: Yes    Comment: 10 cartoons of wine a week.  . Drug use: No     Allergies   Patient has no known allergies.   Review of Systems Review of Systems  Constitutional: Positive for appetite change. Negative for chills and fever.  HENT: Negative for sore throat.   Eyes: Negative for visual disturbance.  Respiratory: Negative for shortness of breath.   Cardiovascular: Negative for chest pain.  Gastrointestinal: Positive for nausea. Negative for abdominal pain and vomiting.  Genitourinary: Negative for dysuria.  Musculoskeletal: Positive for gait problem.  Skin: Positive for wound.  Neurological: Negative for seizures and headaches.  Hematological: Bruises/bleeds easily.     Physical Exam Updated Vital Signs BP (!) 154/91 (BP Location: Right Arm)   Pulse (!) 130   Temp (!) 97.5 F (36.4 C) (Oral)   Resp 20   Ht 4\' 11"  (1.499 m)   Wt 37.6 kg   SpO2 99%   BMI 16.76 kg/m   Physical Exam  Constitutional: She is oriented to person, place, and time. She appears well-developed. She appears cachectic.  HENT:  Head: Normocephalic and atraumatic.  Right Ear: External ear normal.  Left Ear: External ear normal.  Nose: Nose normal.  Mouth/Throat: Oropharynx is clear and moist.  Eyes: Pupils are equal, round, and reactive to light. Conjunctivae and EOM are normal.  Neck: Normal range of motion. Neck supple.  Cardiovascular: Regular rhythm and  normal heart sounds. Tachycardia present.  Musculoskeletal:  She has full range of motion of all her extremities.  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  There is a skin tear on her left lateral knee.  She is got multiple ecchymoses over her arms and legs of various ages and many healing scabs.  Neurological: She is alert and oriented to person, place, and time. GCS eye subscore is 4. GCS verbal subscore is 5. GCS motor subscore is 6.  Patient is awake alert moving all 4 extremities without any difficulty.  She is a clear sensorium.  She is mildly tremulous.  Skin: Skin is warm and dry.  Psychiatric: She has a  normal mood and affect.  Nursing note and vitals reviewed.    ED Treatments / Results  Labs (all labs ordered are listed, but only abnormal results are displayed) Labs Reviewed  COMPREHENSIVE METABOLIC PANEL - Abnormal; Notable for the following components:      Result Value   Glucose, Bld 145 (*)    Calcium 8.5 (*)    AST 183 (*)    ALT 86 (*)    Alkaline Phosphatase 282 (*)    All other components within normal limits  ETHANOL - Abnormal; Notable for the following components:   Alcohol, Ethyl (B) 121 (*)    All other components within normal limits  CBC - Abnormal; Notable for the following components:   WBC 11.5 (*)    Platelets 95 (*)    All other components within normal limits    EKG EKG Interpretation  Date/Time:  Tuesday July 20 2018 20:44:25 EDT Ventricular Rate:  113 PR Interval:    QRS Duration: 83 QT Interval:  319 QTC Calculation: 438 R Axis:   39 Text Interpretation:  Sinus tachycardia Anterior infarct, old similar to prior 10 /15 Confirmed by Aletta Edouard [phone]) on 07/20/2018 8:49:47 PM   Radiology No results found.  Procedures Procedures (including critical care time)  Medications Ordered in ED Medications  LORazepam (ATIVAN) injection 0-4 mg (has no administration in time range)    Or  LORazepam (ATIVAN) tablet 0-4 mg (has  no administration in time range)  LORazepam (ATIVAN) injection 0-4 mg (has no administration in time range)    Or  LORazepam (ATIVAN) tablet 0-4 mg (has no administration in time range)  thiamine (VITAMIN B-1) tablet 100 mg (has no administration in time range)    Or  thiamine (B-1) injection 100 mg (has no administration in time range)  sodium chloride 0.9 % bolus 1,000 mL (has no administration in time range)     Initial Impression / Assessment and Plan / ED Course  I have reviewed the triage vital signs and the nursing notes.  Pertinent labs & imaging results that were available during my care of the patient were reviewed by me and considered in my medical decision making (see chart for details).  Clinical Course as of Jul 21 1236  Tue Jul 20, 2018  2128 Patient lives alone and usually does not use any walker or cane.  She says her balance is been off more recently and she had a fall again today.  Her daughter has been trying multiple times to get her help for her alcohol addiction.  The patient herself denies any withdrawal symptoms but she does admit she will shake at times.  She does not eat much for food and she is very cachectic appearing.   [MB]  2129 She is receiving some IV fluids and she is on a CIWA.  I tried to walk her in the room and she was very unsure of her gait and would need to grab onto things to move around.   [MB]  2141 Discussed with Dr. Manuella Ghazi from the hospitalist service.  He states he would come down and talk with the patient and the daughter and make a decision whether she would benefit from admission.   [MB]  2159 Patient was offered observation for unsteady gait and weakness.  Ultimately she declined to be admitted and the daughter is going to be taking her home.  We will give him numbers for resources for alcohol abuse and a prescription for Librium.   [  MB]    Clinical Course User Index [MB] Hayden Rasmussen, MD     Final Clinical Impressions(s) / ED  Diagnoses   Final diagnoses:  Alcohol abuse  Unsteady gait  Malnutrition, unspecified type [facility])    ED Discharge Orders         Ordered    chlordiazePOXIDE (LIBRIUM) 25 MG capsule     07/20/18 2201           Hayden Rasmussen, MD 07/21/18 1238

## 2018-07-29 DIAGNOSIS — R296 Repeated falls: Secondary | ICD-10-CM | POA: Diagnosis not present

## 2018-07-29 DIAGNOSIS — M6281 Muscle weakness (generalized): Secondary | ICD-10-CM | POA: Diagnosis not present

## 2018-07-29 DIAGNOSIS — R2681 Unsteadiness on feet: Secondary | ICD-10-CM | POA: Diagnosis not present

## 2018-08-04 DIAGNOSIS — R2681 Unsteadiness on feet: Secondary | ICD-10-CM | POA: Diagnosis not present

## 2018-08-04 DIAGNOSIS — R296 Repeated falls: Secondary | ICD-10-CM | POA: Diagnosis not present

## 2018-08-04 DIAGNOSIS — M6281 Muscle weakness (generalized): Secondary | ICD-10-CM | POA: Diagnosis not present

## 2018-08-06 DIAGNOSIS — R2681 Unsteadiness on feet: Secondary | ICD-10-CM | POA: Diagnosis not present

## 2018-08-06 DIAGNOSIS — R296 Repeated falls: Secondary | ICD-10-CM | POA: Diagnosis not present

## 2018-08-06 DIAGNOSIS — M6281 Muscle weakness (generalized): Secondary | ICD-10-CM | POA: Diagnosis not present

## 2018-08-10 DIAGNOSIS — R748 Abnormal levels of other serum enzymes: Secondary | ICD-10-CM | POA: Diagnosis not present

## 2018-08-11 DIAGNOSIS — M6281 Muscle weakness (generalized): Secondary | ICD-10-CM | POA: Diagnosis not present

## 2018-08-11 DIAGNOSIS — R2681 Unsteadiness on feet: Secondary | ICD-10-CM | POA: Diagnosis not present

## 2018-08-11 DIAGNOSIS — R296 Repeated falls: Secondary | ICD-10-CM | POA: Diagnosis not present

## 2018-08-13 DIAGNOSIS — R2681 Unsteadiness on feet: Secondary | ICD-10-CM | POA: Diagnosis not present

## 2018-08-13 DIAGNOSIS — M6281 Muscle weakness (generalized): Secondary | ICD-10-CM | POA: Diagnosis not present

## 2018-08-13 DIAGNOSIS — R296 Repeated falls: Secondary | ICD-10-CM | POA: Diagnosis not present

## 2018-08-18 DIAGNOSIS — R2681 Unsteadiness on feet: Secondary | ICD-10-CM | POA: Diagnosis not present

## 2018-08-18 DIAGNOSIS — M6281 Muscle weakness (generalized): Secondary | ICD-10-CM | POA: Diagnosis not present

## 2018-08-18 DIAGNOSIS — R296 Repeated falls: Secondary | ICD-10-CM | POA: Diagnosis not present

## 2018-08-20 DIAGNOSIS — R2681 Unsteadiness on feet: Secondary | ICD-10-CM | POA: Diagnosis not present

## 2018-08-20 DIAGNOSIS — R296 Repeated falls: Secondary | ICD-10-CM | POA: Diagnosis not present

## 2018-08-20 DIAGNOSIS — M6281 Muscle weakness (generalized): Secondary | ICD-10-CM | POA: Diagnosis not present

## 2018-09-02 DIAGNOSIS — R2681 Unsteadiness on feet: Secondary | ICD-10-CM | POA: Diagnosis not present

## 2018-09-02 DIAGNOSIS — R296 Repeated falls: Secondary | ICD-10-CM | POA: Diagnosis not present

## 2018-09-02 DIAGNOSIS — M6281 Muscle weakness (generalized): Secondary | ICD-10-CM | POA: Diagnosis not present

## 2018-09-16 DIAGNOSIS — M6281 Muscle weakness (generalized): Secondary | ICD-10-CM | POA: Diagnosis not present

## 2018-09-16 DIAGNOSIS — R296 Repeated falls: Secondary | ICD-10-CM | POA: Diagnosis not present

## 2018-09-16 DIAGNOSIS — R2681 Unsteadiness on feet: Secondary | ICD-10-CM | POA: Diagnosis not present

## 2018-09-21 DIAGNOSIS — R2681 Unsteadiness on feet: Secondary | ICD-10-CM | POA: Diagnosis not present

## 2018-09-21 DIAGNOSIS — M6281 Muscle weakness (generalized): Secondary | ICD-10-CM | POA: Diagnosis not present

## 2018-09-21 DIAGNOSIS — R296 Repeated falls: Secondary | ICD-10-CM | POA: Diagnosis not present

## 2018-09-27 DIAGNOSIS — M6281 Muscle weakness (generalized): Secondary | ICD-10-CM | POA: Diagnosis not present

## 2018-09-27 DIAGNOSIS — R2681 Unsteadiness on feet: Secondary | ICD-10-CM | POA: Diagnosis not present

## 2018-09-27 DIAGNOSIS — R296 Repeated falls: Secondary | ICD-10-CM | POA: Diagnosis not present

## 2018-10-04 ENCOUNTER — Encounter (HOSPITAL_COMMUNITY): Admission: RE | Admit: 2018-10-04 | Payer: Medicare Other | Source: Ambulatory Visit

## 2018-10-04 ENCOUNTER — Encounter (HOSPITAL_COMMUNITY)
Admission: RE | Admit: 2018-10-04 | Discharge: 2018-10-04 | Disposition: A | Discharge status: Home / Self Care | Payer: Medicare Other | Source: Ambulatory Visit | Attending: Internal Medicine | Admitting: Internal Medicine

## 2018-10-04 DIAGNOSIS — R296 Repeated falls: Secondary | ICD-10-CM | POA: Diagnosis not present

## 2018-10-04 DIAGNOSIS — R2681 Unsteadiness on feet: Secondary | ICD-10-CM | POA: Diagnosis not present

## 2018-10-04 DIAGNOSIS — M6281 Muscle weakness (generalized): Secondary | ICD-10-CM | POA: Diagnosis not present

## 2018-10-04 MED ORDER — DENOSUMAB 60 MG/ML ~~LOC~~ SOSY
60.0000 mg | PREFILLED_SYRINGE | Freq: Once | SUBCUTANEOUS | Status: DC
  Filled 2018-10-04: qty 1

## 2018-10-11 DIAGNOSIS — N132 Hydronephrosis with renal and ureteral calculous obstruction: Secondary | ICD-10-CM | POA: Diagnosis not present

## 2018-10-11 DIAGNOSIS — N200 Calculus of kidney: Secondary | ICD-10-CM | POA: Diagnosis not present

## 2018-10-11 DIAGNOSIS — N1330 Unspecified hydronephrosis: Secondary | ICD-10-CM | POA: Diagnosis not present

## 2018-10-12 DIAGNOSIS — N200 Calculus of kidney: Secondary | ICD-10-CM | POA: Diagnosis not present

## 2018-10-12 DIAGNOSIS — N201 Calculus of ureter: Secondary | ICD-10-CM | POA: Diagnosis not present

## 2018-10-12 DIAGNOSIS — N23 Unspecified renal colic: Secondary | ICD-10-CM | POA: Diagnosis not present

## 2018-10-12 DIAGNOSIS — N132 Hydronephrosis with renal and ureteral calculous obstruction: Secondary | ICD-10-CM | POA: Diagnosis not present

## 2018-10-13 DIAGNOSIS — N200 Calculus of kidney: Secondary | ICD-10-CM | POA: Diagnosis not present

## 2018-10-13 DIAGNOSIS — R7989 Other specified abnormal findings of blood chemistry: Secondary | ICD-10-CM | POA: Diagnosis not present

## 2018-10-13 DIAGNOSIS — N132 Hydronephrosis with renal and ureteral calculous obstruction: Secondary | ICD-10-CM | POA: Diagnosis not present

## 2018-10-13 DIAGNOSIS — I10 Essential (primary) hypertension: Secondary | ICD-10-CM | POA: Diagnosis not present

## 2018-10-26 DIAGNOSIS — N200 Calculus of kidney: Secondary | ICD-10-CM | POA: Diagnosis not present

## 2018-11-10 ENCOUNTER — Ambulatory Visit (HOSPITAL_COMMUNITY)
Admission: RE | Admit: 2018-11-10 | Discharge: 2018-11-10 | Disposition: A | Discharge status: Home / Self Care | Payer: Medicare Other | Source: Ambulatory Visit | Attending: Internal Medicine | Admitting: Internal Medicine

## 2018-11-10 DIAGNOSIS — Z1231 Encounter for screening mammogram for malignant neoplasm of breast: Secondary | ICD-10-CM | POA: Insufficient documentation

## 2018-12-14 DIAGNOSIS — E538 Deficiency of other specified B group vitamins: Secondary | ICD-10-CM | POA: Diagnosis not present

## 2019-01-03 DIAGNOSIS — E039 Hypothyroidism, unspecified: Secondary | ICD-10-CM | POA: Diagnosis not present

## 2019-01-10 DIAGNOSIS — B0229 Other postherpetic nervous system involvement: Secondary | ICD-10-CM | POA: Diagnosis not present

## 2019-01-10 DIAGNOSIS — E039 Hypothyroidism, unspecified: Secondary | ICD-10-CM | POA: Diagnosis not present

## 2019-01-17 DIAGNOSIS — E538 Deficiency of other specified B group vitamins: Secondary | ICD-10-CM | POA: Diagnosis not present

## 2019-01-18 ENCOUNTER — Encounter (HOSPITAL_COMMUNITY)
Admission: RE | Admit: 2019-01-18 | Discharge: 2019-01-18 | Disposition: A | Discharge status: Home / Self Care | Payer: Medicare Other | Source: Ambulatory Visit | Attending: Internal Medicine | Admitting: Internal Medicine

## 2019-01-18 ENCOUNTER — Encounter (HOSPITAL_COMMUNITY): Payer: Self-pay

## 2019-01-18 DIAGNOSIS — M810 Age-related osteoporosis without current pathological fracture: Secondary | ICD-10-CM | POA: Diagnosis not present

## 2019-01-18 MED ORDER — DENOSUMAB 60 MG/ML ~~LOC~~ SOSY
60.0000 mg | PREFILLED_SYRINGE | Freq: Once | SUBCUTANEOUS | Status: AC
  Administered 2019-01-18: 60 mg via SUBCUTANEOUS

## 2019-02-17 DIAGNOSIS — M25512 Pain in left shoulder: Secondary | ICD-10-CM | POA: Diagnosis not present

## 2019-02-17 DIAGNOSIS — B0229 Other postherpetic nervous system involvement: Secondary | ICD-10-CM | POA: Diagnosis not present

## 2019-02-17 DIAGNOSIS — E538 Deficiency of other specified B group vitamins: Secondary | ICD-10-CM | POA: Diagnosis not present

## 2019-04-04 ENCOUNTER — Ambulatory Visit (HOSPITAL_COMMUNITY): Payer: Medicare Other

## 2019-04-13 DIAGNOSIS — L299 Pruritus, unspecified: Secondary | ICD-10-CM | POA: Diagnosis not present

## 2019-05-10 ENCOUNTER — Other Ambulatory Visit: Payer: Self-pay

## 2019-05-10 ENCOUNTER — Encounter: Payer: Self-pay | Admitting: Orthopaedic Surgery

## 2019-05-10 ENCOUNTER — Ambulatory Visit (INDEPENDENT_AMBULATORY_CARE_PROVIDER_SITE_OTHER): Payer: Medicare Other

## 2019-05-10 ENCOUNTER — Ambulatory Visit (INDEPENDENT_AMBULATORY_CARE_PROVIDER_SITE_OTHER): Payer: Medicare Other | Admitting: Orthopaedic Surgery

## 2019-05-10 VITALS — BP 152/102 | HR 131 | Temp 97.3°F | Ht 60.0 in | Wt 94.0 lb

## 2019-05-10 DIAGNOSIS — M1990 Unspecified osteoarthritis, unspecified site: Secondary | ICD-10-CM | POA: Insufficient documentation

## 2019-05-10 DIAGNOSIS — G8929 Other chronic pain: Secondary | ICD-10-CM

## 2019-05-10 DIAGNOSIS — M25561 Pain in right knee: Secondary | ICD-10-CM | POA: Diagnosis not present

## 2019-05-10 NOTE — Progress Notes (Signed)
Subjective:    Patient ID: [name], female    DOB: March 21, 1941, 78 y.o.   MRN: [id number]  HPI She has had pain in the right knee for several months.  She had shingles of the lower back area in mid winter.  She has resolved that problem but had some joint problems after it.  The right knee remains to bother her.  She has some swelling at times but no giving way or redness.  She has good and bad days. She has no trauma.  She is concerned that the knee still hurts at times.  The pain is posterior and lateral at times.  She has tried Advil, rest, heat, ice with no help. She saw Dr. Willey Blade and he asked that I see her.  I have reviewed his notes.    Review of Systems  Constitutional: Positive for activity change.  Musculoskeletal: Positive for arthralgias, gait problem and joint swelling.  Psychiatric/Behavioral: The patient is nervous/anxious.   All other systems reviewed and are negative.  For Review of Systems, all other systems reviewed and are negative.  The following is a summary of the past history medically, past history surgically, known current medicines, social history and family history.  This information is gathered electronically by the computer from prior information and documentation.  I review this each visit and have found including this information at this point in the chart is beneficial and informative.   Past Medical History:  Diagnosis Date  . Anxiety   . Arthritis   . 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 Carpal tunnel syndrome, bilateral   . Depression   . GERD (gastroesophageal reflux disease)   . Kidney stones   . Liver disease   . Osteoporosis     Past Surgical History:  Procedure Laterality Date  . ABDOMINAL HYSTERECTOMY     vaginal  . APPENDECTOMY    . CARPAL TUNNEL RELEASE  01/21/2012   Procedure: CARPAL TUNNEL RELEASE;  Surgeon: Wynonia Sours, MD;  Location: [place];  Service: Orthopedics;  Laterality: Right;  .  COLON RESECTION  2004   perf bowel after colonoscopy  . CYSTOSCOPY W/ URETERAL STENT PLACEMENT  2010   lt-lazer stone  . CYSTOSCOPY WITH STENT PLACEMENT Bilateral 09/08/2014   Procedure: CYSTOSCOPY WITH STENT PLACEMENT;  Surgeon: Malka So, MD;  Location: [facility];  Service: Urology;  Laterality: Bilateral;  . THORACOTOMY  2007   vatz-rt upper lobe  . TONSILLECTOMY      Current Outpatient Medications on File Prior to Visit  Medication Sig Dispense Refill  . alendronate (FOSAMAX) 70 MG tablet     . Biotin 2.5 MG TABS Take 1 tablet by mouth daily.     . chlordiazePOXIDE (LIBRIUM) 25 MG capsule 50mg  PO TID x 1D, then 25-50mg  PO BID X 1D, then 25-50mg  PO QD X 1D 10 capsule 0  . cyanocobalamin (,VITAMIN B-12,) 1000 MCG/ML injection INJECT 1ML [place] ONCE A MONTH    . cycloSPORINE (RESTASIS) 0.05 % ophthalmic emulsion Place 1 drop into both eyes 2 (two) times daily.     Marland Kitchen denosumab (PROLIA) 60 MG/ML SOSY injection Inject 60 mg into the skin every 6 (six) months.    . gabapentin (NEURONTIN) 100 MG capsule     . Multiple Vitamin (MULTIVITAMIN) capsule Take 1 capsule by mouth daily.    . naltrexone (DEPADE) 50 MG tablet     . potassium citrate (UROCIT-K) 10 MEQ (1080 MG)  SR tablet     . Probiotic Product (ALIGN PO) Take 1 capsule by mouth daily.    Marland Kitchen thiamine 100 MG tablet Take 100 mg by mouth daily.     Marland Kitchen triamcinolone cream (KENALOG) 0.1 %      No current facility-administered medications on file prior to visit.     Social History   Socioeconomic History  . Marital status: Widowed    Spouse name: Not on file  . Number of children: Not on file  . Years of education: Not on file  . Highest education level: Not on file  Occupational History  . Not on file  Social Needs  . Financial resource strain: Not on file  . Food insecurity:    Worry: Not on file    Inability: Not on file  . Transportation needs:    Medical: Not on file    Non-medical: Not on file  Tobacco Use  . Smoking  status: Former Smoker    Types: Cigarettes    Last attempt to quit: 12/01/1996    Years since quitting: 22.4  . Smokeless tobacco: Never Used  Substance and Sexual Activity  . Alcohol use: Yes    Comment: 10 cartoons of wine a week.  . Drug use: No  . Sexual activity: Not on file  Lifestyle  . Physical activity:    Days per week: Not on file    Minutes per session: Not on file  . Stress: Not on file  Relationships  . Social connections:    Talks on phone: Not on file    Gets together: Not on file    Attends religious service: Not on file    Active member of club or organization: Not on file    Attends meetings of clubs or organizations: Not on file    Relationship status: Not on file  . Intimate partner violence:    Fear of current or ex partner: Not on file    Emotionally abused: Not on file    Physically abused: Not on file    Forced sexual activity: Not on file  Other Topics Concern  . Not on file  Social History Narrative  . Not on file    Family History  Problem Relation Age of Onset  . Healthy Mother   . Cancer Father        lung    BP (!) 152/102   Pulse (!) 131   Temp (!) 97.3 F (36.3 C)   Ht 5' (1.524 m)   Wt 94 lb (42.6 kg)   BMI 18.36 kg/m   Body mass index is 18.36 kg/m.      Objective:   Physical Exam Vitals signs reviewed.  Constitutional:      Appearance: She is well-developed.  HENT:     Head: Normocephalic and atraumatic.  Eyes:     Conjunctiva/sclera: Conjunctivae normal.     Pupils: Pupils are equal, round, and reactive to light.  Neck:     Musculoskeletal: Normal range of motion and neck supple.  Cardiovascular:     Rate and Rhythm: Normal rate and regular rhythm.  Pulmonary:     Effort: Pulmonary effort is normal.  Abdominal:     Palpations: Abdomen is soft.  Musculoskeletal:     Right knee: Tenderness found. Lateral joint line tenderness noted.       Legs:  Skin:    General: Skin is warm and dry.  Neurological:      Mental Status: She  is alert and oriented to person, place, and time.     Cranial Nerves: No cranial nerve deficit.     Motor: No abnormal muscle tone.     Coordination: Coordination normal.     Deep Tendon Reflexes: Reflexes are normal and symmetric. Reflexes normal.  Psychiatric:        Behavior: Behavior normal.        Thought Content: Thought content normal.        Judgment: Judgment normal.      X-rays were done of the right knee, reported separately.    Assessment & Plan:   Encounter Diagnosis  Name Primary?  . Chronic pain of right knee Yes   PROCEDURE NOTE:  The patient requests injections of the right knee , verbal consent was obtained.  The right knee was prepped appropriately after time out was performed.   Sterile technique was observed and injection of 1 cc of Depo-Medrol 40 mg with several cc's of plain xylocaine. Anesthesia was provided by ethyl chloride and a 20-gauge needle was used to inject the knee area. The injection was tolerated well.  A band aid dressing was applied.  The patient was advised to apply ice later today and tomorrow to the injection sight as needed.  I will see her in two weeks.  She may need MRI.  Call if any problem.  Precautions discussed.   Electronically Signed Sanjuana Kava, MD 6/9/20209:03 AM

## 2019-05-24 ENCOUNTER — Other Ambulatory Visit: Payer: Self-pay

## 2019-05-24 ENCOUNTER — Ambulatory Visit (INDEPENDENT_AMBULATORY_CARE_PROVIDER_SITE_OTHER): Payer: Medicare Other | Admitting: Orthopaedic Surgery

## 2019-05-24 ENCOUNTER — Encounter: Payer: Self-pay | Admitting: Orthopaedic Surgery

## 2019-05-24 VITALS — BP 196/107 | HR 79 | Temp 97.2°F | Ht 60.0 in | Wt 92.0 lb

## 2019-05-24 DIAGNOSIS — G8929 Other chronic pain: Secondary | ICD-10-CM | POA: Diagnosis not present

## 2019-05-24 DIAGNOSIS — M25561 Pain in right knee: Secondary | ICD-10-CM | POA: Diagnosis not present

## 2019-05-24 NOTE — Progress Notes (Signed)
Patient [name], female DOB:[birth year], 78 y.o. GYF:[id number]  Chief Complaint  Patient presents with  . Knee Pain    Rt knee feeling better    HPI  [name] is a 78 y.o. female who is doing very well with the right knee today. The injection helped and she has no further problem. She has resumed normal activity.   Body mass index is 17.97 kg/m.  ROS  Review of Systems  Constitutional: Positive for activity change.  Musculoskeletal: Positive for arthralgias, gait problem and joint swelling.  Psychiatric/Behavioral: The patient is nervous/anxious.   All other systems reviewed and are negative.   All other systems reviewed and are negative.  The following is a summary of the past history medically, past history surgically, known current medicines, social history and family history.  This information is gathered electronically by the computer from prior information and documentation.  I review this each visit and have found including this information at this point in the chart is beneficial and informative.    Past Medical History:  Diagnosis Date  . Anxiety   . Arthritis   . 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 Carpal tunnel syndrome, bilateral   . Depression   . GERD (gastroesophageal reflux disease)   . Kidney stones   . Liver disease   . Osteoporosis     Past Surgical History:  Procedure Laterality Date  . ABDOMINAL HYSTERECTOMY     vaginal  . APPENDECTOMY    . CARPAL TUNNEL RELEASE  01/21/2012   Procedure: CARPAL TUNNEL RELEASE;  Surgeon: Wynonia Sours, MD;  Location: [place];  Service: Orthopedics;  Laterality: Right;  . COLON RESECTION  2004   perf bowel after colonoscopy  . CYSTOSCOPY W/ URETERAL STENT PLACEMENT  2010   lt-lazer stone  . CYSTOSCOPY WITH STENT PLACEMENT Bilateral 09/08/2014   Procedure: CYSTOSCOPY WITH STENT PLACEMENT;  Surgeon: Malka So, MD;  Location: [facility];  Service: Urology;   Laterality: Bilateral;  . THORACOTOMY  2007   vatz-rt upper lobe  . TONSILLECTOMY      Family History  Problem Relation Age of Onset  . Healthy Mother   . Cancer Father        lung    Social History Social History   Tobacco Use  . Smoking status: Former Smoker    Types: Cigarettes    Quit date: 12/01/1996    Years since quitting: 22.4  . Smokeless tobacco: Never Used  Substance Use Topics  . Alcohol use: Yes    Comment: 10 cartoons of wine a week.  . Drug use: No    Allergies  Allergen Reactions  . Clindamycin Rash  . Doxycycline Hyclate Rash    Current Outpatient Medications  Medication Sig Dispense Refill  . alendronate (FOSAMAX) 70 MG tablet     . Biotin 2.5 MG TABS Take 1 tablet by mouth daily.     . chlordiazePOXIDE (LIBRIUM) 25 MG capsule 50mg  PO TID x 1D, then 25-50mg  PO BID X 1D, then 25-50mg  PO QD X 1D 10 capsule 0  . cyanocobalamin (,VITAMIN B-12,) 1000 MCG/ML injection INJECT 1ML  ONCE A MONTH    . cycloSPORINE (RESTASIS) 0.05 % ophthalmic emulsion Place 1 drop into both eyes 2 (two) times daily.     Marland Kitchen denosumab (PROLIA) 60 MG/ML SOSY injection Inject 60 mg into the skin every 6 (six) months.    . gabapentin (NEURONTIN) 100 MG capsule     .  Multiple Vitamin (MULTIVITAMIN) capsule Take 1 capsule by mouth daily.    . naltrexone (DEPADE) 50 MG tablet     . potassium citrate (UROCIT-K) 10 MEQ (1080 MG) SR tablet     . Probiotic Product (ALIGN PO) Take 1 capsule by mouth daily.    Marland Kitchen thiamine 100 MG tablet Take 100 mg by mouth daily.     Marland Kitchen triamcinolone cream (KENALOG) 0.1 %      No current facility-administered medications for this visit.      Physical Exam  Blood pressure (!) 196/107, pulse 79, temperature (!) 97.2 F (36.2 C), height 5' (1.524 m), weight 92 lb (41.7 kg).  Constitutional: overall normal hygiene, normal nutrition, well developed, normal grooming, normal body habitus. Assistive device:none  Musculoskeletal: gait and station Limp  none, muscle tone and strength are normal, no tremors or atrophy is present.  .  Neurological: coordination overall normal.  Deep tendon reflex/nerve stretch intact.  Sensation normal.  Cranial nerves II-XII intact.   Skin:   Normal overall no scars, lesions, ulcers or rashes. No psoriasis.  Psychiatric: Alert and oriented x 3.  Recent memory intact, remote memory unclear.  Normal mood and affect. Well groomed.  Good eye contact.  Cardiovascular: overall no swelling, no varicosities, no edema bilaterally, normal temperatures of the legs and arms, no clubbing, cyanosis and good capillary refill.  Lymphatic: palpation is normal.  Right knee has normal exam.  All other systems reviewed and are negative   The patient has been educated about the nature of the problem(s) and counseled on treatment options.  The patient appeared to understand what I have discussed and is in agreement with it.  Encounter Diagnosis  Name Primary?  . Chronic pain of right knee Yes    PLAN Call if any problems.  Precautions discussed.  Continue current medications.   Return to clinic prn   Electronically Signed Sanjuana Kava, MD 6/23/20209:02 AM

## 2019-05-30 DIAGNOSIS — E538 Deficiency of other specified B group vitamins: Secondary | ICD-10-CM | POA: Diagnosis not present

## 2019-06-14 DIAGNOSIS — B029 Zoster without complications: Secondary | ICD-10-CM | POA: Diagnosis not present

## 2019-06-14 DIAGNOSIS — F1011 Alcohol abuse, in remission: Secondary | ICD-10-CM | POA: Diagnosis not present

## 2019-07-04 DIAGNOSIS — E538 Deficiency of other specified B group vitamins: Secondary | ICD-10-CM | POA: Diagnosis not present

## 2019-07-19 ENCOUNTER — Other Ambulatory Visit: Payer: Self-pay

## 2019-07-19 ENCOUNTER — Encounter (HOSPITAL_COMMUNITY)
Admission: RE | Admit: 2019-07-19 | Discharge: 2019-07-19 | Disposition: A | Discharge status: Home / Self Care | Payer: Medicare Other | Source: Ambulatory Visit | Attending: Internal Medicine | Admitting: Internal Medicine

## 2019-07-19 ENCOUNTER — Encounter (HOSPITAL_COMMUNITY): Payer: Self-pay

## 2019-07-19 DIAGNOSIS — M810 Age-related osteoporosis without current pathological fracture: Secondary | ICD-10-CM | POA: Insufficient documentation

## 2019-07-19 MED ORDER — DENOSUMAB 60 MG/ML ~~LOC~~ SOSY
PREFILLED_SYRINGE | SUBCUTANEOUS | Status: AC
  Filled 2019-07-19: qty 1

## 2019-07-19 MED ORDER — DENOSUMAB 60 MG/ML ~~LOC~~ SOSY
60.0000 mg | PREFILLED_SYRINGE | Freq: Once | SUBCUTANEOUS | Status: AC
  Administered 2019-07-19: 60 mg via SUBCUTANEOUS

## 2019-08-11 DIAGNOSIS — E538 Deficiency of other specified B group vitamins: Secondary | ICD-10-CM | POA: Diagnosis not present

## 2019-08-19 ENCOUNTER — Other Ambulatory Visit: Payer: Self-pay

## 2019-08-19 DIAGNOSIS — Z20822 Contact with and (suspected) exposure to covid-19: Secondary | ICD-10-CM

## 2019-08-19 DIAGNOSIS — R6889 Other general symptoms and signs: Secondary | ICD-10-CM | POA: Diagnosis not present

## 2019-08-20 LAB — NOVEL CORONAVIRUS, NAA: SARS-CoV-2, NAA: NOT DETECTED

## 2019-08-23 ENCOUNTER — Telehealth: Payer: Self-pay | Admitting: Hematology

## 2019-08-23 NOTE — Telephone Encounter (Signed)
Pt is aware covid 19 test is negative °

## 2019-09-01 DIAGNOSIS — H748X3 Other specified disorders of middle ear and mastoid, bilateral: Secondary | ICD-10-CM | POA: Diagnosis not present

## 2019-09-15 DIAGNOSIS — E538 Deficiency of other specified B group vitamins: Secondary | ICD-10-CM | POA: Diagnosis not present

## 2019-10-20 DIAGNOSIS — E538 Deficiency of other specified B group vitamins: Secondary | ICD-10-CM | POA: Diagnosis not present

## 2019-11-04 ENCOUNTER — Other Ambulatory Visit: Payer: Self-pay

## 2019-11-04 DIAGNOSIS — Z20828 Contact with and (suspected) exposure to other viral communicable diseases: Secondary | ICD-10-CM | POA: Diagnosis not present

## 2019-11-04 DIAGNOSIS — Z20822 Contact with and (suspected) exposure to covid-19: Secondary | ICD-10-CM

## 2019-11-05 LAB — NOVEL CORONAVIRUS, NAA: SARS-CoV-2, NAA: NOT DETECTED

## 2019-11-09 ENCOUNTER — Telehealth: Payer: Self-pay | Admitting: *Deleted

## 2019-11-09 NOTE — Telephone Encounter (Signed)
Reviewed negative Covid19 results with the patient. No further questions. 

## 2019-12-18 ENCOUNTER — Other Ambulatory Visit: Payer: Self-pay

## 2019-12-18 ENCOUNTER — Emergency Department (HOSPITAL_COMMUNITY): Payer: Medicare Other

## 2019-12-18 ENCOUNTER — Inpatient Hospital Stay (HOSPITAL_COMMUNITY)
Admission: EM | Admit: 2019-12-18 | Discharge: 2019-12-20 | DRG: 065 | Disposition: A | Discharge status: Home Health | Payer: Medicare Other | Attending: Internal Medicine | Admitting: Internal Medicine

## 2019-12-18 ENCOUNTER — Encounter (HOSPITAL_COMMUNITY): Payer: Self-pay | Admitting: Emergency Medicine

## 2019-12-18 DIAGNOSIS — Z20822 Contact with and (suspected) exposure to covid-19: Secondary | ICD-10-CM | POA: Diagnosis present

## 2019-12-18 DIAGNOSIS — R197 Diarrhea, unspecified: Secondary | ICD-10-CM | POA: Diagnosis not present

## 2019-12-18 DIAGNOSIS — S3993XA Unspecified injury of pelvis, initial encounter: Secondary | ICD-10-CM | POA: Diagnosis not present

## 2019-12-18 DIAGNOSIS — E876 Hypokalemia: Secondary | ICD-10-CM

## 2019-12-18 DIAGNOSIS — R0902 Hypoxemia: Secondary | ICD-10-CM | POA: Diagnosis not present

## 2019-12-18 DIAGNOSIS — Z90710 Acquired absence of both cervix and uterus: Secondary | ICD-10-CM

## 2019-12-18 DIAGNOSIS — R7401 Elevation of levels of liver transaminase levels: Secondary | ICD-10-CM

## 2019-12-18 DIAGNOSIS — K219 Gastro-esophageal reflux disease without esophagitis: Secondary | ICD-10-CM | POA: Diagnosis present

## 2019-12-18 DIAGNOSIS — N179 Acute kidney failure, unspecified: Secondary | ICD-10-CM

## 2019-12-18 DIAGNOSIS — K7010 Alcoholic hepatitis without ascites: Secondary | ICD-10-CM | POA: Diagnosis present

## 2019-12-18 DIAGNOSIS — F1020 Alcohol dependence, uncomplicated: Secondary | ICD-10-CM | POA: Diagnosis present

## 2019-12-18 DIAGNOSIS — R531 Weakness: Secondary | ICD-10-CM | POA: Diagnosis not present

## 2019-12-18 DIAGNOSIS — D6959 Other secondary thrombocytopenia: Secondary | ICD-10-CM | POA: Diagnosis present

## 2019-12-18 DIAGNOSIS — R296 Repeated falls: Secondary | ICD-10-CM | POA: Diagnosis present

## 2019-12-18 DIAGNOSIS — M25559 Pain in unspecified hip: Secondary | ICD-10-CM | POA: Diagnosis not present

## 2019-12-18 DIAGNOSIS — E869 Volume depletion, unspecified: Secondary | ICD-10-CM | POA: Diagnosis present

## 2019-12-18 DIAGNOSIS — L89150 Pressure ulcer of sacral region, unstageable: Secondary | ICD-10-CM | POA: Diagnosis present

## 2019-12-18 DIAGNOSIS — I609 Nontraumatic subarachnoid hemorrhage, unspecified: Secondary | ICD-10-CM | POA: Diagnosis not present

## 2019-12-18 DIAGNOSIS — F32A Depression, unspecified: Secondary | ICD-10-CM | POA: Insufficient documentation

## 2019-12-18 DIAGNOSIS — R5381 Other malaise: Secondary | ICD-10-CM

## 2019-12-18 DIAGNOSIS — D539 Nutritional anemia, unspecified: Secondary | ICD-10-CM | POA: Diagnosis present

## 2019-12-18 DIAGNOSIS — M1712 Unilateral primary osteoarthritis, left knee: Secondary | ICD-10-CM

## 2019-12-18 DIAGNOSIS — F329 Major depressive disorder, single episode, unspecified: Secondary | ICD-10-CM | POA: Diagnosis present

## 2019-12-18 DIAGNOSIS — K709 Alcoholic liver disease, unspecified: Secondary | ICD-10-CM | POA: Diagnosis present

## 2019-12-18 DIAGNOSIS — S066X0A Traumatic subarachnoid hemorrhage without loss of consciousness, initial encounter: Secondary | ICD-10-CM | POA: Diagnosis not present

## 2019-12-18 DIAGNOSIS — M25562 Pain in left knee: Secondary | ICD-10-CM

## 2019-12-18 DIAGNOSIS — M810 Age-related osteoporosis without current pathological fracture: Secondary | ICD-10-CM | POA: Diagnosis present

## 2019-12-18 DIAGNOSIS — D696 Thrombocytopenia, unspecified: Secondary | ICD-10-CM

## 2019-12-18 DIAGNOSIS — Z79899 Other long term (current) drug therapy: Secondary | ICD-10-CM

## 2019-12-18 DIAGNOSIS — E871 Hypo-osmolality and hyponatremia: Secondary | ICD-10-CM | POA: Diagnosis present

## 2019-12-18 LAB — CBC WITH DIFFERENTIAL/PLATELET
Abs Immature Granulocytes: 0.03 10*3/uL (ref 0.00–0.07)
Basophils Absolute: 0 10*3/uL (ref 0.0–0.1)
Basophils Relative: 0 %
Eosinophils Absolute: 0 10*3/uL (ref 0.0–0.5)
Eosinophils Relative: 0 %
HCT: 28.5 % — ABNORMAL LOW (ref 36.0–46.0)
Hemoglobin: 9.2 g/dL — ABNORMAL LOW (ref 12.0–15.0)
Immature Granulocytes: 0 %
Lymphocytes Relative: 3 %
Lymphs Abs: 0.3 10*3/uL — ABNORMAL LOW (ref 0.7–4.0)
MCH: 32.4 pg (ref 26.0–34.0)
MCHC: 32.3 g/dL (ref 30.0–36.0)
MCV: 100.4 fL — ABNORMAL HIGH (ref 80.0–100.0)
Monocytes Absolute: 1.3 10*3/uL — ABNORMAL HIGH (ref 0.1–1.0)
Monocytes Relative: 14 %
Neutro Abs: 7.6 10*3/uL (ref 1.7–7.7)
Neutrophils Relative %: 83 %
Platelets: 95 10*3/uL — ABNORMAL LOW (ref 150–400)
RBC: 2.84 MIL/uL — ABNORMAL LOW (ref 3.87–5.11)
RDW: 15.1 % (ref 11.5–15.5)
WBC: 9.2 10*3/uL (ref 4.0–10.5)
nRBC: 0 % (ref 0.0–0.2)

## 2019-12-18 LAB — PHOSPHORUS: Phosphorus: 4.7 mg/dL — ABNORMAL HIGH (ref 2.5–4.6)

## 2019-12-18 LAB — COMPREHENSIVE METABOLIC PANEL
ALT: 49 U/L — ABNORMAL HIGH (ref 0–44)
AST: 88 U/L — ABNORMAL HIGH (ref 15–41)
Albumin: 3.7 g/dL (ref 3.5–5.0)
Alkaline Phosphatase: 172 U/L — ABNORMAL HIGH (ref 38–126)
Anion gap: 17 — ABNORMAL HIGH (ref 5–15)
BUN: 21 mg/dL (ref 8–23)
CO2: 19 mmol/L — ABNORMAL LOW (ref 22–32)
Calcium: 8.2 mg/dL — ABNORMAL LOW (ref 8.9–10.3)
Chloride: 95 mmol/L — ABNORMAL LOW (ref 98–111)
Creatinine, Ser: 1.26 mg/dL — ABNORMAL HIGH (ref 0.44–1.00)
GFR calc Af Amer: 47 mL/min — ABNORMAL LOW (ref >60)
GFR calc non Af Amer: 41 mL/min — ABNORMAL LOW (ref >60)
Glucose, Bld: 72 mg/dL (ref 70–99)
Potassium: 2.7 mmol/L — CL (ref 3.5–5.1)
Sodium: 131 mmol/L — ABNORMAL LOW (ref 135–145)
Total Bilirubin: 2 mg/dL — ABNORMAL HIGH (ref 0.3–1.2)
Total Protein: 6.1 g/dL — ABNORMAL LOW (ref 6.5–8.1)

## 2019-12-18 LAB — PROTIME-INR
INR: 1.1 (ref 0.8–1.2)
Prothrombin Time: 13.9 seconds (ref 11.4–15.2)

## 2019-12-18 LAB — MAGNESIUM: Magnesium: 1.8 mg/dL (ref 1.7–2.4)

## 2019-12-18 LAB — ETHANOL: Alcohol, Ethyl (B): <10 mg/dL (ref <10)

## 2019-12-18 MED ORDER — SODIUM CHLORIDE 0.9 % IV SOLN: Freq: Once | INTRAVENOUS | Status: AC

## 2019-12-18 MED ORDER — SODIUM CHLORIDE 0.9 % IV BOLUS
500.0000 mL | Freq: Once | INTRAVENOUS | Status: AC
  Administered 2019-12-18: 500 mL via INTRAVENOUS

## 2019-12-18 MED ORDER — LEVETIRACETAM 500 MG PO TABS
500.0000 mg | ORAL_TABLET | Freq: Two times a day (BID) | ORAL | Status: DC
  Administered 2019-12-18 – 2019-12-20 (×4): 500 mg via ORAL
  Filled 2019-12-18 (×4): qty 1

## 2019-12-18 MED ORDER — POTASSIUM CHLORIDE 10 MEQ/100ML IV SOLN
10.0000 meq | INTRAVENOUS | Status: AC
  Administered 2019-12-18 – 2019-12-19 (×4): 10 meq via INTRAVENOUS
  Filled 2019-12-18 (×4): qty 100

## 2019-12-18 MED ORDER — POTASSIUM CHLORIDE IN NACL 40-0.9 MEQ/L-% IV SOLN
INTRAVENOUS | Status: DC
  Administered 2019-12-19: 100 mL/h via INTRAVENOUS
  Filled 2019-12-18: qty 1000

## 2019-12-18 MED ORDER — MAGNESIUM SULFATE 2 GM/50ML IV SOLN
2.0000 g | Freq: Once | INTRAVENOUS | Status: AC
  Administered 2019-12-18: 2 g via INTRAVENOUS
  Filled 2019-12-18: qty 50

## 2019-12-18 NOTE — ED Provider Notes (Addendum)
[name] DE[facility]MENT Provider Note   CSN: [id number] Arrival date & time: 12/18/19  1918     History Chief Complaint  Patient presents with  . Weakness    [name] is a 79 y.o. female.  Level 5 caveat for acuity of condition.  Patient found on floor for unknown length of time.  History of alcohol consumption.  Multiple falls recently.  Recent bouts of diarrhea after starting a "antibiotic last week" for unknown reason.  Multiple areas of ecchymosis on her body.  States last alcohol was Thursday.  She is "wobbly" when she walks.  Review of system headache.  No fever, chills, chest pain, dyspnea known Covid exposures.        Past Medical History:  Diagnosis Date  . Anxiety   . Arthritis   . 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 Carpal tunnel syndrome, bilateral   . Depression   . GERD (gastroesophageal reflux disease)   . Kidney stones   . Liver disease   . Osteoporosis     Patient Active Problem List   Diagnosis Date Noted  . Arthritis 05/10/2019  . Diarrhea 10/13/2017  . Kidney stone 10/31/2014  . Left ureteral stone 09/08/2014  . Right ureteral stone 09/08/2014  . Infection of urinary tract 09/08/2014  . Pain in joint, shoulder region 08/09/2012  . Muscle weakness (generalized) 08/09/2012  . Impingement syndrome of left shoulder 08/09/2012    Past Surgical History:  Procedure Laterality Date  . ABDOMINAL HYSTERECTOMY     vaginal  . APPENDECTOMY    . CARPAL TUNNEL RELEASE  01/21/2012   Procedure: CARPAL TUNNEL RELEASE;  Surgeon: Wynonia Sours, MD;  Location: [facility];  Service: Orthopedics;  Laterality: Right;  . COLON RESECTION  2004   perf bowel after colonoscopy  . CYSTOSCOPY W/ URETERAL STENT PLACEMENT  2010   lt-lazer stone  . CYSTOSCOPY WITH STENT PLACEMENT Bilateral 09/08/2014   Procedure: CYSTOSCOPY WITH STENT PLACEMENT;  Surgeon: Malka So, MD;  Location: [facility];  Service: Urology;  Laterality:  Bilateral;  . THORACOTOMY  2007   vatz-rt upper lobe  . TONSILLECTOMY       OB History   No obstetric history on file.     Family History  Problem Relation Age of Onset  . Healthy Mother   . Cancer Father        lung    Social History   Tobacco Use  . Smoking status: Former Smoker    Types: Cigarettes    Quit date: 12/01/1996    Years since quitting: 23.0  . Smokeless tobacco: Never Used  Substance Use Topics  . Alcohol use: Yes    Comment: 10 cartoons of wine a week.  . Drug use: No    Home Medications Prior to Admission medications   Medication Sig Start Date End Date Taking? Authorizing Provider  cefdinir (OMNICEF) 300 MG capsule Take 300 mg by mouth 2 (two) times daily. 12/13/19  Yes [provider]  cyanocobalamin (,VITAMIN B-12,) 1000 MCG/ML injection Inject 1,000 mcg into the skin every 30 (thirty) days.  03/22/19  Yes [provider]  alendronate (FOSAMAX) 70 MG tablet  12/18/18   [provider]  Biotin 2.5 MG TABS Take 1 tablet by mouth daily.     [provider]  chlordiazePOXIDE (LIBRIUM) 25 MG capsule 50mg  PO TID x 1D, then 25-50mg  PO BID X 1D, then 25-50mg  PO QD X 1D 07/20/18  Hayden Rasmussen, MD  cycloSPORINE (RESTASIS) 0.05 % ophthalmic emulsion Place 1 drop into both eyes 2 (two) times daily.     [provider]  denosumab (PROLIA) 60 MG/ML SOSY injection Inject 60 mg into the skin every 6 (six) months.    Asencion Noble, MD  gabapentin (NEURONTIN) 100 MG capsule  02/04/19   [provider]  Multiple Vitamin (MULTIVITAMIN) capsule Take 1 capsule by mouth daily.    [provider]  naltrexone (DEPADE) 50 MG tablet  11/25/18   [provider]  potassium citrate (UROCIT-K) 10 MEQ (1080 MG) SR tablet  03/22/19   [provider]  Probiotic Product (ALIGN PO) Take 1 capsule by mouth daily.    [provider]  thiamine 100 MG tablet Take 100 mg by mouth daily.     [provider]  triamcinolone cream (KENALOG) 0.1 %  04/13/19   [provider]    Allergies    Clindamycin and Doxycycline hyclate  Review of Systems   Review of Systems  Unable to perform ROS: Acuity of condition    Physical Exam Updated Vital Signs BP 126/77   Pulse (!) 110   Temp 97.8 F (36.6 C) (Oral)   Resp 13   Ht 4\' 10"  (1.473 m)   Wt 44.5 kg   SpO2 100%   BMI 20.48 kg/m   Physical Exam Vitals and nursing note reviewed.  Constitutional:      Appearance: She is well-developed.     Comments: Cachectic  HENT:     Head: Normocephalic and atraumatic.  Eyes:     Conjunctiva/sclera: Conjunctivae normal.  Cardiovascular:     Rate and Rhythm: Normal rate and regular rhythm.  Pulmonary:     Effort: Pulmonary effort is normal.     Breath sounds: Normal breath sounds.  Abdominal:     General: Bowel sounds are normal.     Palpations: Abdomen is soft.  Musculoskeletal:        General: Normal range of motion.     Cervical back: Neck supple.  Skin:    Comments: Multiple bruises on body.  Neurological:     General: No focal deficit present.     Mental Status: She is alert and oriented to person, place, and time.  Psychiatric:     Comments: Flat affect.     ED Results / Procedures / Treatments   Labs (all labs ordered are listed, but only abnormal results are displayed) Labs Reviewed  CBC WITH DIFFERENTIAL/PLATELET - Abnormal; Notable for the following components:      Result Value   RBC 2.84 (*)    Hemoglobin 9.2 (*)    HCT 28.5 (*)    MCV 100.4 (*)    Platelets 95 (*)    Lymphs Abs 0.3 (*)    Monocytes Absolute 1.3 (*)    All other components within normal limits  COMPREHENSIVE METABOLIC PANEL - Abnormal; Notable for the following components:   Sodium 131 (*)    Potassium 2.7 (*)    Chloride 95 (*)    CO2 19 (*)    Creatinine, Ser 1.26 (*)    Calcium 8.2 (*)    Total Protein 6.1 (*)    AST 88 (*)    ALT 49 (*)    Alkaline Phosphatase 172  (*)    Total Bilirubin 2.0 (*)    GFR calc non Af Amer 41 (*)    GFR calc Af Amer 47 (*)  Anion gap 17 (*)    All other components within normal limits  SARS CORONAVIRUS 2 (TAT 6-24 HRS)  URINALYSIS, ROUTINE W REFLEX MICROSCOPIC  ETHANOL  RAPID URINE DRUG SCREEN, HOSP PERFORMED    EKG EKG Interpretation  Date/Time:  Sunday December 18 2019 19:55:25 EST Ventricular Rate:  111 PR Interval:    QRS Duration: 119 QT Interval:  339 QTC Calculation: 461 R Axis:   78 Text Interpretation: Atrial fibrillation with rapid V-rate Paired ventricular premature complexes Nonspecific intraventricular conduction delay Borderline low voltage, extremity leads Anteroseptal infarct, old Nonspecific T abnormalities, lateral leads Artifact in lead(s) I III aVR aVL aVF V1 V2 V4 V5 V6 Confirmed by Nat Christen [phone]) on 12/18/2019 8:29:07 PM   Radiology DG Pelvis 1-2 Views  Result Date: 12/18/2019 CLINICAL DATA:  Pain status post fall EXAM: PELVIS - 1-2 VIEW COMPARISON:  None. FINDINGS: There is no acute displaced fracture. No dislocation. Degenerative changes are noted of both hips and the lower lumbar spine. Multiple surgical clips project over the patient's pelvis. Osteopenia is noted. IMPRESSION: Negative. Electronically Signed   By: Constance Holster M.D.   On: 12/18/2019 21:56   CT Head Wo Contrast  Result Date: 12/18/2019 CLINICAL DATA:  Ataxia. Found on the floor. Possible stroke. Carcinoid tumor of the lung. EXAM: CT HEAD WITHOUT CONTRAST TECHNIQUE: Contiguous axial images were obtained from the base of the skull through the vertex without intravenous contrast. COMPARISON:  None. FINDINGS: Brain: Localized subarachnoid hemorrhage and right parietal sulci. Cannot rule out a minor cortical contusion. Otherwise, the brain shows generalized atrophy with chronic small-vessel ischemic changes of the white matter. No hydrocephalus. No subdural collection. Vascular: There is atherosclerotic calcification of  the major vessels at the base of the brain. Skull: No skull fracture. Sinuses/Orbits: Clear/normal Other: Scalp hematoma overlying the region of subarachnoid hemorrhage. IMPRESSION: Small volume subarachnoid hemorrhage in right parietal sulci. Cannot rule out minor cortical contusion. No overlying skull fracture. Overlying scalp hematoma. Atrophy and chronic small-vessel ischemic changes elsewhere. These results were called by telephone at the time of interpretation on 12/18/2019 at 9:42 pm to provider [facility] , who verbally acknowledged these results. Electronically Signed   By: Nelson Chimes M.D.   On: 12/18/2019 21:43   DG Chest Port 1 View  Result Date: 12/18/2019 CLINICAL DATA:  Weakness, patient found down for unknown period of time EXAM: PORTABLE CHEST 1 VIEW COMPARISON:  Radiograph 09/08/2014, 01/08/2015; CT 12/28/2014 FINDINGS: Coarse reticular changes in the lung apices may reflect atelectasis and/or architectural distortion superimposed upon the first costovertebral junction. No consolidative opacity. No pneumothorax or visible effusion. The aorta is calcified. The remaining cardiomediastinal contours are unremarkable. Few remote rib deformities are similar to prior. No acute visible displaced rib fracture. High-riding appearance of the left humeral head with subchondral sclerosis and cystic change at the greater tuberosity likely reflect some rotator cuff insufficiency though should correlate for acute symptoms in the right shoulder. Dextrocurvature of the thoracolumbar junction is similar to prior. IMPRESSION: 1. No acute cardiopulmonary findings. 2. Remote rib deformities. 3. High-riding appearance of the left humeral head with subchondral sclerosis and cystic change at the greater tuberosity likely reflect some rotator cuff insufficiency. Though could correlate for acute symptoms. Electronically Signed   By: Lovena Le M.D.   On: 12/18/2019 21:02    Procedures Procedures (including critical  care time)  Medications Ordered in ED Medications  potassium chloride 10 mEq in 100 mL IVPB (has no administration in time range)  sodium chloride 0.9 % bolus 500 mL (500 mLs Intravenous New Bag/Given 12/18/19 2036)  0.9 %  sodium chloride infusion ( Intravenous New Bag/Given 12/18/19 2035)    ED Course  I have reviewed the triage vital signs and the nursing notes.  Pertinent labs & imaging results that were available during my care of the patient were reviewed by me and considered in my medical decision making (see chart for details).    MDM Rules/Calculators/A&P                      Patient found on floor at home.  She is tachycardic, but recently alert.  Will check labs, chest x-ray, urinalysis, CT head, IV fluids, likely admission.   2155: Discussion with radiologist concerning for subarachnoid hemorrhage.  Will consult neurosurgery.  2225: Discussed with neurosurgeon.  No acute intervention necessary.  Will admit to [facility]. CRITICAL CARE Performed by: Nat Christen Total critical care time: 35 minutes Critical care time was exclusive of separately billable procedures and treating other patients. Critical care was necessary to treat or prevent imminent or life-threatening deterioration. Critical care was time spent personally by me on the following activities: development of treatment plan with patient and/or surrogate as well as nursing, discussions with consultants, evaluation of patient's response to treatment, examination of patient, obtaining history from patient or surrogate, ordering and performing treatments and interventions, ordering and review of laboratory studies, ordering and review of radiographic studies, pulse oximetry and re-evaluation of patient's condition. Final Clinical Impression(s) / ED Diagnoses Final diagnoses:  Diarrhea, unspecified type  SAH (subarachnoid hemorrhage) ([place])  Hypokalemia    Rx / DC Orders ED Discharge Orders    None       Nat Christen, MD 12/18/19 2054    Nat Christen, MD 12/18/19 2153    Nat Christen, MD 12/18/19 2223

## 2019-12-18 NOTE — ED Triage Notes (Addendum)
EMS called out for weakness. Found pt in floor. Pt in floor unknown period of time. Pt reportdly a heavy drinker and has had multiple falls recently. Pt's buttocks very discolored from falls. Pt states last use of ETOH was Thursday. Pt states she uses a can but "is seriously considering" getting a walker because she is "very wobbly". Pt c/o "slight headache" as well as back pain. States back pain is not from fall but because she watches tv in floor while propped up on pillows.

## 2019-12-18 NOTE — H&P (Signed)
History and Physical    LAWRENCE ROLDAN FHL:[id number] DOB: Dec 19, 1940 DOA: 12/18/2019  PCP: Asencion Noble, MD   Patient coming from: Home.  I have personally briefly reviewed patient's old medical records in [place]  Chief Complaint: Fall.  HPI: [name] is a 79 y.o. female with medical history significant of anxiety, osteoarthritis, carcinoid bronchial adenoma right lung, bilateral carpal tunnel syndrome, depression, GERD, urolithiasis, osteoporosis, alcoholic liver disease who was brought to the emergency department after being found on the floor.  She is a heavy wine drinker.  She normally ambulates with a cane, but has been having difficulty with her balance and multiple falls recently.  The nursing staff reports that her gluteal area has some ecchymosis from.  She mentioned that her last time she had wine was on Thursday. She reports having a mild headache. She denies blurred vision, nausea or emesis.  ED Course: Initial vital signs temperature 97.8 F, pulse 110, respirations 16, blood pressure 120/84 mmHg and O2 sat 100% on room air.  The patient was given a 500 mL NS bolus, KCl 10 mEq IVPB x4 and was started on Keppra 500 mg p.o. twice daily.  The case was discussed with neurosurgery and neurology.  Neurosurgery recommended for the patient to stay at [facility] given current logistical transfer challenges and from their point of view benign clinical picture.  Repeat CT head will be performed in the morning.  White count is 9.2, hemoglobin 9.2 g/dL and platelets 95.  PT was 13.9 and INR 1.1.  Sodium 131, potassium 2.7, chloride 95 and CO2 19 mmol/L.  Alcohol was less than 10, magnesium 1.8, phosphorus 4.7, calcium 8.2, BUN 21, creatinine 1.26 and glucose 72.  Total protein 6.1 and albumin 3.7 g/dL.  AST 88, ALT 49 alkaline phosphatase 172 units/L.  Total bilirubin is 2.0 units/L.  Review of Systems: As per HPI otherwise 10 point review of systems negative.   Past Medical History:   Diagnosis Date  . Anxiety   . Arthritis   . 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 Carpal tunnel syndrome, bilateral   . Depression   . GERD (gastroesophageal reflux disease)   . Kidney stones   . Liver disease   . Osteoporosis     Past Surgical History:  Procedure Laterality Date  . ABDOMINAL HYSTERECTOMY     vaginal  . APPENDECTOMY    . CARPAL TUNNEL RELEASE  01/21/2012   Procedure: CARPAL TUNNEL RELEASE;  Surgeon: Wynonia Sours, MD;  Location: [facility];  Service: Orthopedics;  Laterality: Right;  . COLON RESECTION  2004   perf bowel after colonoscopy  . CYSTOSCOPY W/ URETERAL STENT PLACEMENT  2010   lt-lazer stone  . CYSTOSCOPY WITH STENT PLACEMENT Bilateral 09/08/2014   Procedure: CYSTOSCOPY WITH STENT PLACEMENT;  Surgeon: Malka So, MD;  Location: [facility];  Service: Urology;  Laterality: Bilateral;  . THORACOTOMY  2007   vatz-rt upper lobe  . TONSILLECTOMY       reports that she quit smoking about 23 years ago. Her smoking use included cigarettes. She has never used smokeless tobacco. She reports current alcohol use. She reports that she does not use drugs.  Allergies  Allergen Reactions  . Clindamycin Rash  . Doxycycline Hyclate Rash    Family History  Problem Relation Age of Onset  . Healthy Mother   . Cancer Father        lung   Prior to Admission  medications   Medication Sig Start Date End Date Taking? Authorizing Provider  cefdinir (OMNICEF) 300 MG capsule Take 300 mg by mouth 2 (two) times daily. 12/13/19  Yes [provider]  cyanocobalamin (,VITAMIN B-12,) 1000 MCG/ML injection Inject 1,000 mcg into the skin every 30 (thirty) days.  03/22/19  Yes [provider]  alendronate (FOSAMAX) 70 MG tablet  12/18/18   [provider]  Biotin 2.5 MG TABS Take 1 tablet by mouth daily.     [provider]  chlordiazePOXIDE (LIBRIUM) 25 MG capsule 50mg  PO TID x 1D, then 25-50mg  PO BID X 1D, then 25-50mg   PO QD X 1D 07/20/18   Hayden Rasmussen, MD  cycloSPORINE (RESTASIS) 0.05 % ophthalmic emulsion Place 1 drop into both eyes 2 (two) times daily.     [provider]  denosumab (PROLIA) 60 MG/ML SOSY injection Inject 60 mg into the skin every 6 (six) months.    Asencion Noble, MD  gabapentin (NEURONTIN) 100 MG capsule  02/04/19   [provider]  Multiple Vitamin (MULTIVITAMIN) capsule Take 1 capsule by mouth daily.    [provider]  naltrexone (DEPADE) 50 MG tablet  11/25/18   [provider]  potassium citrate (UROCIT-K) 10 MEQ (1080 MG) SR tablet  03/22/19   [provider]  Probiotic Product (ALIGN PO) Take 1 capsule by mouth daily.    [provider]  thiamine 100 MG tablet Take 100 mg by mouth daily.     [provider]  triamcinolone cream (KENALOG) 0.1 %  04/13/19   [provider]    Physical Exam: Vitals:   12/18/19 2100 12/18/19 2200 12/18/19 2300 12/18/19 2315  BP: 133/90 (!) 136/97 (!) 134/97   Pulse:    (!) 105  Resp: (!) 23 18 17  (!) 24  Temp:      TempSrc:      SpO2:    100%  Weight:      Height:        Constitutional: Looks under nourished, but currently NAD. Eyes: PERRL, lids and conjunctivae normal.  Positive icterus. ENMT: Mucous membranes are moist. Posterior pharynx clear of any exudate or lesions. Neck: normal, supple, no masses, no thyromegaly Respiratory: Decreased breath sounds in bases, otherwise clear to auscultation bilaterally, no wheezing, no crackles. Normal respiratory effort. No accessory muscle use.  Cardiovascular: Tachycardic in the low 100s, no murmurs / rubs / gallops. No extremity edema. 2+ pedal pulses. No carotid bruits.  Abdomen: Nondistended.  Soft, no tenderness, no masses palpated. No hepatosplenomegaly. Bowel sounds positive.  Musculoskeletal: no clubbing / cyanosis. Good ROM, no contractures. Normal muscle tone.  Skin: Multiple areas of ecchymosis on all her extremities,  particularly dorsal aspects of the arms and pretibial area on both legs. Neurologic: CN 2-12 grossly intact. Sensation intact, DTR normal.  Generalized weakness. Psychiatric: Normal judgment and insight. Alert and oriented x 3.   Labs on Admission: I have personally reviewed following labs and imaging studies  CBC: Recent Labs  Lab 12/18/19 2111  WBC 9.2  NEUTROABS 7.6  HGB 9.2*  HCT 28.5*  MCV 100.4*  PLT 95*   Basic Metabolic Panel: Recent Labs  Lab 12/18/19 2111  NA 131*  K 2.7*  CL 95*  CO2 19*  GLUCOSE 72  BUN 21  CREATININE 1.26*  CALCIUM 8.2*   GFR: Estimated Creatinine Clearance: 23.8 mL/min (A) (by C-G formula based on SCr of 1.26 mg/dL (H)). Liver Function Tests: Recent Labs  Lab  12/18/19 2111  AST 88*  ALT 49*  ALKPHOS 172*  BILITOT 2.0*  PROT 6.1*  ALBUMIN 3.7   No results for input(s): LIPASE, AMYLASE in the last 168 hours. No results for input(s): AMMONIA in the last 168 hours. Coagulation Profile: No results for input(s): INR, PROTIME in the last 168 hours. Cardiac Enzymes: No results for input(s): CKTOTAL, CKMB, CKMBINDEX, TROPONINI in the last 168 hours. BNP (last 3 results) No results for input(s): PROBNP in the last 8760 hours. HbA1C: No results for input(s): HGBA1C in the last 72 hours. CBG: No results for input(s): GLUCAP in the last 168 hours. Lipid Profile: No results for input(s): CHOL, HDL, LDLCALC, TRIG, CHOLHDL, LDLDIRECT in the last 72 hours. Thyroid Function Tests: No results for input(s): TSH, T4TOTAL, FREET4, T3FREE, THYROIDAB in the last 72 hours. Anemia Panel: No results for input(s): VITAMINB12, FOLATE, FERRITIN, TIBC, IRON, RETICCTPCT in the last 72 hours. Urine analysis:    Component Value Date/Time   COLORURINE YELLOW 09/08/2014 0300   APPEARANCEUR CLEAR 09/08/2014 0300   LABSPEC 1.015 09/08/2014 0300   PHURINE 5.5 09/08/2014 0300   GLUCOSEU NEGATIVE 09/08/2014 0300   HGBUR LARGE (A) 09/08/2014 0300    BILIRUBINUR NEGATIVE 09/08/2014 0300   KETONESUR NEGATIVE 09/08/2014 0300   PROTEINUR NEGATIVE 09/08/2014 0300   UROBILINOGEN 0.2 09/08/2014 0300   NITRITE NEGATIVE 09/08/2014 0300   LEUKOCYTESUR SMALL (A) 09/08/2014 0300    Radiological Exams on Admission: DG Pelvis 1-2 Views  Result Date: 12/18/2019 CLINICAL DATA:  Pain status post fall EXAM: PELVIS - 1-2 VIEW COMPARISON:  None. FINDINGS: There is no acute displaced fracture. No dislocation. Degenerative changes are noted of both hips and the lower lumbar spine. Multiple surgical clips project over the patient's pelvis. Osteopenia is noted. IMPRESSION: Negative. Electronically Signed   By: Constance Holster M.D.   On: 12/18/2019 21:56   CT Head Wo Contrast  Result Date: 12/18/2019 CLINICAL DATA:  Ataxia. Found on the floor. Possible stroke. Carcinoid tumor of the lung. EXAM: CT HEAD WITHOUT CONTRAST TECHNIQUE: Contiguous axial images were obtained from the base of the skull through the vertex without intravenous contrast. COMPARISON:  None. FINDINGS: Brain: Localized subarachnoid hemorrhage and right parietal sulci. Cannot rule out a minor cortical contusion. Otherwise, the brain shows generalized atrophy with chronic small-vessel ischemic changes of the white matter. No hydrocephalus. No subdural collection. Vascular: There is atherosclerotic calcification of the major vessels at the base of the brain. Skull: No skull fracture. Sinuses/Orbits: Clear/normal Other: Scalp hematoma overlying the region of subarachnoid hemorrhage. IMPRESSION: Small volume subarachnoid hemorrhage in right parietal sulci. Cannot rule out minor cortical contusion. No overlying skull fracture. Overlying scalp hematoma. Atrophy and chronic small-vessel ischemic changes elsewhere. These results were called by telephone at the time of interpretation on 12/18/2019 at 9:42 pm to provider [facility] , who verbally acknowledged these results. Electronically Signed   By: Nelson Chimes M.D.   On: 12/18/2019 21:43   DG Chest Port 1 View  Result Date: 12/18/2019 CLINICAL DATA:  Weakness, patient found down for unknown period of time EXAM: PORTABLE CHEST 1 VIEW COMPARISON:  Radiograph 09/08/2014, 01/08/2015; CT 12/28/2014 FINDINGS: Coarse reticular changes in the lung apices may reflect atelectasis and/or architectural distortion superimposed upon the first costovertebral junction. No consolidative opacity. No pneumothorax or visible effusion. The aorta is calcified. The remaining cardiomediastinal contours are unremarkable. Few remote rib deformities are similar to prior. No acute visible displaced rib fracture. High-riding appearance of the left humeral head  with subchondral sclerosis and cystic change at the greater tuberosity likely reflect some rotator cuff insufficiency though should correlate for acute symptoms in the right shoulder. Dextrocurvature of the thoracolumbar junction is similar to prior. IMPRESSION: 1. No acute cardiopulmonary findings. 2. Remote rib deformities. 3. High-riding appearance of the left humeral head with subchondral sclerosis and cystic change at the greater tuberosity likely reflect some rotator cuff insufficiency. Though could correlate for acute symptoms. Electronically Signed   By: Lovena Le M.D.   On: 12/18/2019 21:02    EKG: Independently reviewed.  Extensive artifact. Second EKG ordered.  Assessment/Plan Principal Problem:   Subarachnoid bleed ([place]) Per neurosurgery, no need to transfer. They had recommended: Frequent neuro checks. Keppra prophylaxis for seizures. Repeat CT in a.m.  Active Problems:   Hypokalemia Correcting. Magnesium has been supplemented. Follow-up potassium level.    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[place]) No signs of withdrawals at this time. CIWA protocol if withdrawal symptoms appear. Follow-up hepatic function in the morning.    Hyponatremia Likely due to diarrhea. Time-limited IV hydration  overnight. Follow-up sodium level.    DVT prophylaxis: SCDs. Code Status: Full code. Family Communication:  Disposition Plan: Observation for repeat CT head and electrolyte replacement. Consults called: Admission status: Observation/stepdown.   Reubin Milan MD [facility]  If 7PM-7AM, please contact night-coverage [email or web address]  12/18/2019, 11:37 PM   This document was prepared using Dragon voice recognition software and may contain some unintended transcription errors.

## 2019-12-18 NOTE — Progress Notes (Signed)
  NEUROSURGERY PROGRESS NOTE   Received call from Dr Lacinda Axon, [place] at [facility] regarding patient. 79 year old with history of alcohol abuse found down for unknown length of time. Per EDP patient ?Felt ataxic today, although is grossly neuro intact. CT head was obtained revealing small amount of SAH vs contusion right parietal region. No hydrocephalus. No MLS. NSY called for recs.   SAH is minimal. No indicated for NS intervention. should heal with time. Plan is for patient to be admitted for further management under TH. No need for patient to be transferred to [facility] for this small, non-operative SAH. Will formally consult if patient winds up at [facility]. Will need a repeat head CT in the am for monitoring. If stable, cleared for d/c from NS perspective. - neuro check q 2 hours - keppra 500mg  BID x7days for seizure prophylaxis - d/c all blood thinning agents  Please call for any concerns.  Ferne Reus, PA-C [facility] [employer]

## 2019-12-18 NOTE — Progress Notes (Signed)
Patient earrings removed for exam placed in plastic bag and taped to patients wristband by diana garcia.  Belonging returned with patient to [facility] department

## 2019-12-18 NOTE — ED Notes (Signed)
Date and time results received: 12/18/19 22:21 (use smartphrase ".now" to insert current time)  Test: potassium Critical Value: 2.7  Name of Provider Notified: Lacinda Axon  Orders Received? Or Actions Taken?: Dr. Lacinda Axon notified.

## 2019-12-19 ENCOUNTER — Encounter (HOSPITAL_COMMUNITY): Payer: Self-pay | Admitting: Internal Medicine

## 2019-12-19 ENCOUNTER — Observation Stay (HOSPITAL_COMMUNITY): Payer: Medicare Other

## 2019-12-19 DIAGNOSIS — L89150 Pressure ulcer of sacral region, unstageable: Secondary | ICD-10-CM | POA: Diagnosis present

## 2019-12-19 DIAGNOSIS — I609 Nontraumatic subarachnoid hemorrhage, unspecified: Secondary | ICD-10-CM

## 2019-12-19 DIAGNOSIS — M1712 Unilateral primary osteoarthritis, left knee: Secondary | ICD-10-CM | POA: Diagnosis not present

## 2019-12-19 DIAGNOSIS — R7401 Elevation of levels of liver transaminase levels: Secondary | ICD-10-CM

## 2019-12-19 DIAGNOSIS — E871 Hypo-osmolality and hyponatremia: Secondary | ICD-10-CM

## 2019-12-19 DIAGNOSIS — N179 Acute kidney failure, unspecified: Secondary | ICD-10-CM

## 2019-12-19 DIAGNOSIS — F329 Major depressive disorder, single episode, unspecified: Secondary | ICD-10-CM | POA: Diagnosis present

## 2019-12-19 DIAGNOSIS — R5381 Other malaise: Secondary | ICD-10-CM

## 2019-12-19 DIAGNOSIS — Z79899 Other long term (current) drug therapy: Secondary | ICD-10-CM | POA: Diagnosis not present

## 2019-12-19 DIAGNOSIS — R197 Diarrhea, unspecified: Secondary | ICD-10-CM

## 2019-12-19 DIAGNOSIS — R531 Weakness: Secondary | ICD-10-CM | POA: Diagnosis present

## 2019-12-19 DIAGNOSIS — E876 Hypokalemia: Secondary | ICD-10-CM

## 2019-12-19 DIAGNOSIS — F1020 Alcohol dependence, uncomplicated: Secondary | ICD-10-CM

## 2019-12-19 DIAGNOSIS — E869 Volume depletion, unspecified: Secondary | ICD-10-CM | POA: Diagnosis present

## 2019-12-19 DIAGNOSIS — M25562 Pain in left knee: Secondary | ICD-10-CM | POA: Diagnosis not present

## 2019-12-19 DIAGNOSIS — Z20822 Contact with and (suspected) exposure to covid-19: Secondary | ICD-10-CM | POA: Diagnosis present

## 2019-12-19 DIAGNOSIS — F10232 Alcohol dependence with withdrawal with perceptual disturbance: Secondary | ICD-10-CM

## 2019-12-19 DIAGNOSIS — Z90710 Acquired absence of both cervix and uterus: Secondary | ICD-10-CM | POA: Diagnosis not present

## 2019-12-19 DIAGNOSIS — K219 Gastro-esophageal reflux disease without esophagitis: Secondary | ICD-10-CM | POA: Diagnosis present

## 2019-12-19 DIAGNOSIS — K7010 Alcoholic hepatitis without ascites: Secondary | ICD-10-CM | POA: Diagnosis present

## 2019-12-19 DIAGNOSIS — M810 Age-related osteoporosis without current pathological fracture: Secondary | ICD-10-CM | POA: Diagnosis present

## 2019-12-19 DIAGNOSIS — D696 Thrombocytopenia, unspecified: Secondary | ICD-10-CM

## 2019-12-19 DIAGNOSIS — D6959 Other secondary thrombocytopenia: Secondary | ICD-10-CM | POA: Diagnosis present

## 2019-12-19 DIAGNOSIS — D539 Nutritional anemia, unspecified: Secondary | ICD-10-CM

## 2019-12-19 DIAGNOSIS — K760 Fatty (change of) liver, not elsewhere classified: Secondary | ICD-10-CM | POA: Diagnosis not present

## 2019-12-19 DIAGNOSIS — R296 Repeated falls: Secondary | ICD-10-CM | POA: Diagnosis present

## 2019-12-19 HISTORY — DX: Nontraumatic subarachnoid hemorrhage, unspecified: I60.9

## 2019-12-19 LAB — CBC WITH DIFFERENTIAL/PLATELET
Abs Immature Granulocytes: 0.03 10*3/uL (ref 0.00–0.07)
Basophils Absolute: 0 10*3/uL (ref 0.0–0.1)
Basophils Relative: 0 %
Eosinophils Absolute: 0 10*3/uL (ref 0.0–0.5)
Eosinophils Relative: 0 %
HCT: 26.1 % — ABNORMAL LOW (ref 36.0–46.0)
Hemoglobin: 8.4 g/dL — ABNORMAL LOW (ref 12.0–15.0)
Immature Granulocytes: 0 %
Lymphocytes Relative: 10 %
Lymphs Abs: 0.7 10*3/uL (ref 0.7–4.0)
MCH: 32.6 pg (ref 26.0–34.0)
MCHC: 32.2 g/dL (ref 30.0–36.0)
MCV: 101.2 fL — ABNORMAL HIGH (ref 80.0–100.0)
Monocytes Absolute: 1 10*3/uL (ref 0.1–1.0)
Monocytes Relative: 14 %
Neutro Abs: 5.7 10*3/uL (ref 1.7–7.7)
Neutrophils Relative %: 76 %
Platelets: 101 10*3/uL — ABNORMAL LOW (ref 150–400)
RBC: 2.58 MIL/uL — ABNORMAL LOW (ref 3.87–5.11)
RDW: 15.2 % (ref 11.5–15.5)
WBC: 7.5 10*3/uL (ref 4.0–10.5)
nRBC: 0 % (ref 0.0–0.2)

## 2019-12-19 LAB — COMPREHENSIVE METABOLIC PANEL
ALT: 43 U/L (ref 0–44)
AST: 72 U/L — ABNORMAL HIGH (ref 15–41)
Albumin: 3.2 g/dL — ABNORMAL LOW (ref 3.5–5.0)
Alkaline Phosphatase: 143 U/L — ABNORMAL HIGH (ref 38–126)
Anion gap: 14 (ref 5–15)
BUN: 16 mg/dL (ref 8–23)
CO2: 16 mmol/L — ABNORMAL LOW (ref 22–32)
Calcium: 7.4 mg/dL — ABNORMAL LOW (ref 8.9–10.3)
Chloride: 102 mmol/L (ref 98–111)
Creatinine, Ser: 1.02 mg/dL — ABNORMAL HIGH (ref 0.44–1.00)
GFR calc Af Amer: >60 mL/min (ref >60)
GFR calc non Af Amer: 53 mL/min — ABNORMAL LOW (ref >60)
Glucose, Bld: 53 mg/dL — ABNORMAL LOW (ref 70–99)
Potassium: 3.7 mmol/L (ref 3.5–5.1)
Sodium: 132 mmol/L — ABNORMAL LOW (ref 135–145)
Total Bilirubin: 1.3 mg/dL — ABNORMAL HIGH (ref 0.3–1.2)
Total Protein: 5.4 g/dL — ABNORMAL LOW (ref 6.5–8.1)

## 2019-12-19 LAB — SARS CORONAVIRUS 2 (TAT 6-24 HRS): SARS Coronavirus 2: NEGATIVE

## 2019-12-19 LAB — RAPID URINE DRUG SCREEN, HOSP PERFORMED
Amphetamines: NOT DETECTED
Barbiturates: NOT DETECTED
Benzodiazepines: NOT DETECTED
Cocaine: NOT DETECTED
Opiates: NOT DETECTED
Tetrahydrocannabinol: NOT DETECTED

## 2019-12-19 LAB — T4, FREE: Free T4: 1.07 ng/dL (ref 0.61–1.12)

## 2019-12-19 LAB — URINALYSIS, ROUTINE W REFLEX MICROSCOPIC
Bilirubin Urine: NEGATIVE
Glucose, UA: NEGATIVE mg/dL
Ketones, ur: 20 mg/dL — AB
Nitrite: NEGATIVE
Protein, ur: NEGATIVE mg/dL
RBC / HPF: >50 RBC/hpf — ABNORMAL HIGH (ref 0–5)
Specific Gravity, Urine: 1.013 (ref 1.005–1.030)
pH: 5 (ref 5.0–8.0)

## 2019-12-19 LAB — C DIFFICILE QUICK SCREEN W PCR REFLEX
C Diff antigen: NEGATIVE
C Diff interpretation: NOT DETECTED
C Diff toxin: NEGATIVE

## 2019-12-19 LAB — MRSA PCR SCREENING: MRSA by PCR: NEGATIVE

## 2019-12-19 LAB — FOLATE: Folate: 17.3 ng/mL (ref >5.9)

## 2019-12-19 LAB — TSH: TSH: 4.337 u[IU]/mL (ref 0.350–4.500)

## 2019-12-19 LAB — VITAMIN B12: Vitamin B-12: 559 pg/mL (ref 180–914)

## 2019-12-19 LAB — CK: Total CK: 281 U/L — ABNORMAL HIGH (ref 38–234)

## 2019-12-19 MED ORDER — MAGNESIUM SULFATE 2 GM/50ML IV SOLN
2.0000 g | Freq: Once | INTRAVENOUS | Status: AC
  Administered 2019-12-19: 2 g via INTRAVENOUS
  Filled 2019-12-19: qty 50

## 2019-12-19 MED ORDER — SODIUM CHLORIDE 0.9 % IV SOLN: INTRAVENOUS | Status: DC

## 2019-12-19 MED ORDER — CHLORHEXIDINE GLUCONATE CLOTH 2 % EX PADS
6.0000 | MEDICATED_PAD | Freq: Every day | CUTANEOUS | Status: DC
  Administered 2019-12-19: 6 via TOPICAL

## 2019-12-19 NOTE — TOC Initial Note (Signed)
Transition of Care [facility]) - Initial/Assessment Note    Patient Details  Name: [name] MRN: [id number] Date of Birth: 1940/12/29  Transition of Care [facility]) CM/SW Contact:    Boneta Lucks, RN Phone Number: 12/19/2019, 3:46 PM  Clinical Narrative:   Patient admitted for Subarachnoid bleed. Patient lives at home alone. PT is recommending HHPT and rolling walker. CM at the bedside speaking with patient she is agreeing. Reviewed Choices. Blake Divine with [facility] will deliver the rolling walker to the room. Linda with [facility] took order for HHPT. CM spoke with [name]- daughter she is in agreement with the plan.                 Expected Discharge Plan: [place] Barriers to Discharge: Continued Medical Work up   Patient Goals and CMS Choice Patient states their goals for this hospitalization and ongoing recovery are:: to go home. CMS Medicare.gov Compare Post Acute Care list provided to:: Patient Choice offered to / list presented to : Patient  Expected Discharge Plan and Services Expected Discharge Plan: [place]       Living arrangements for the past 2 months: Single Family Home                 DME Arranged: Walker rolling DME Agency: [employer] Date DME Agency Contacted: 12/19/19 Time DME Agency Contacted: [id number] Representative spoke with at DME Agency: [place]: PT [place]: [place] ([place]) Date [place]: 12/19/19 Time [place]: 0093 Representative spoke with at [place]: Romualdo Bolk  Prior Living Arrangements/Services Living arrangements for the past 2 months: [place] with:: Self Patient language and need for interpreter reviewed:: Yes Do you feel safe going back to the place where you live?: Yes      Need for Family Participation in Patient Care: Yes (Comment) Care giver support system in place?: Yes (comment)   Criminal  Activity/Legal Involvement Pertinent to Current Situation/Hospitalization: No - Comment as needed  Activities of Daily Living Home Assistive Devices/Equipment: Cane (specify quad or straight), Eyeglasses ADL Screening (condition at time of admission) Patient's cognitive ability adequate to safely complete daily activities?: Yes Is the patient deaf or have difficulty hearing?: No Does the patient have difficulty seeing, even when wearing glasses/contacts?: No Does the patient have difficulty concentrating, remembering, or making decisions?: No Patient able to express need for assistance with ADLs?: Yes Does the patient have difficulty dressing or bathing?: Yes Independently performs ADLs?: No Communication: Independent Dressing (OT): Needs assistance Is this a change from baseline?: Change from baseline, expected to last <3days Grooming: Needs assistance Is this a change from baseline?: Change from baseline, expected to last <3 days Feeding: Independent Bathing: Needs assistance Is this a change from baseline?: Change from baseline, expected to last <3 days Toileting: Needs assistance Is this a change from baseline?: Change from baseline, expected to last <3 days In/Out Bed: Needs assistance Is this a change from baseline?: Change from baseline, expected to last >3 days Walks in Home: Independent with device (comment)(cane) Does the patient have difficulty walking or climbing stairs?: Yes Weakness of Legs: Both Weakness of Arms/Hands: None  Permission Sought/Granted      Share Information with NAME: [name]     Permission granted to share info w Relationship: Daughter     Emotional Assessment     Affect (typically observed): Accepting Orientation: : Oriented to Self, Oriented to Place,  Oriented to  Time, Oriented to Situation Alcohol / Substance Use: Alcohol Use Psych Involvement: No (comment)  Admission diagnosis:  Hypokalemia [E87.6] SAH (subarachnoid hemorrhage) (HCC)  [I60.9] Subarachnoid bleed (HCC) [I60.9] Diarrhea, unspecified type [R19.7] Subarachnoid hemorrhage ([place]) [I60.9] Patient Active Problem List   Diagnosis Date Noted  . AKI (acute kidney injury) ([place]) 12/19/2019  . Physical deconditioning 12/19/2019  . Alcohol dependence ([place]) 12/19/2019  . Thrombocytopenia ([place]) 12/19/2019  . Subarachnoid hemorrhage ([place]) 12/19/2019  . SAH (subarachnoid hemorrhage) ([place])   . Transaminasemia   . Subarachnoid bleed ([place]) 12/18/2019  . Hypokalemia 12/18/2019  . Depression   . Hyperbilirubinemia   . Hyponatremia   . Macrocytic anemia   . 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 Arthritis 05/10/2019  . Diarrhea 10/13/2017  . Kidney stone 10/31/2014  . Left ureteral stone 09/08/2014  . Right ureteral stone 09/08/2014  . Infection of urinary tract 09/08/2014  . Pain in joint, shoulder region 08/09/2012  . Muscle weakness (generalized) 08/09/2012  . Impingement syndrome of left shoulder 08/09/2012   PCP:  Asencion Noble, MD Pharmacy:   [facility] [place] [address] [place] [address] Phone: [phone] Fax: [phone]

## 2019-12-19 NOTE — Consult Note (Signed)
WOC Nurse Consult Note: Reason for Consult: pressure injury Patient reports she has been "sitting in the floor" propped on pillows for weeks"  Wound type: Unstagable pressure injury: sacrum: 3cm x 2cm x 0cm  Multiple scabbed areas over the left knee; left pretibial  Extensive bruising over the left and right thighs and hips  Pressure Injury POA: Yes Measurement: see above  Wound bed: sacrum; black but not dry eschar yet.  Will most likely evolve to that soon Drainage (amount, consistency, odor) none Periwound: intact, redness over the buttock Dressing procedure/placement/frequency: Foam dressing only for now, monitor for evolution  Scabbed areas open to air.  Low air loss mattress for pressure redistribution and moisture management Nutritional consult for wound healing   Discussed POC with patient and bedside nurse.  Re consult if needed, will not follow at this time. Thanks  Hanaa Payes [employer], RN,CWOCN, CNS, [place] [phone])

## 2019-12-19 NOTE — ED Notes (Addendum)
ED TO INPATIENT HANDOFF REPORT  ED Nurse Name and Phone #:  Jonelle Sidle, [place]  S Name/Age/Gender [name] 79 y.o. female Room/Bed: APA02/APA02  Code Status   Code Status: Not on file  Home/SNF/Other Home Patient oriented to: self, situation, place, time Is this baseline? Yes   Triage Complete: Triage complete  Chief Complaint Subarachnoid bleed ([place]) [I60.9]  Triage Note EMS called out for weakness. Found pt in floor. Pt in floor unknown period of time. Pt reportdly a heavy drinker and has had multiple falls recently. Pt's buttocks very discolored from falls. Pt states last use of ETOH was Thursday. Pt states she uses a can but "is seriously considering" getting a walker because she is "very wobbly". Pt c/o "slight headache" as well as back pain. States back pain is not from fall but because she watches tv in floor while propped up on pillows.    Allergies Allergies  Allergen Reactions  . Clindamycin Rash  . Doxycycline Hyclate Rash    Level of Care/Admitting Diagnosis ED Disposition    ED Disposition Condition [place] Area: [facility] [[id number]]  Level of Care: Stepdown [14]  Covid Evaluation: Asymptomatic Screening Protocol (No Symptoms)  Diagnosis: Subarachnoid bleed [facility]) [[id number]]  Admitting Physician: Reubin Milan [[id number]]  Attending Physician: Reubin Milan [[id number]]       B Medical/Surgery History Past Medical History:  Diagnosis Date  . Anxiety   . Arthritis   . 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)   . Carpal tunnel syndrome, bilateral   . Depression   . GERD (gastroesophageal reflux disease)   . Kidney stones   . Liver disease   . Osteoporosis    Past Surgical History:  Procedure Laterality Date  . ABDOMINAL HYSTERECTOMY     vaginal  . APPENDECTOMY    . CARPAL TUNNEL RELEASE  01/21/2012   Procedure: CARPAL TUNNEL RELEASE;  Surgeon: Wynonia Sours, MD;  Location: [facility];  Service: Orthopedics;  Laterality: Right;  . COLON RESECTION  2004   perf bowel after colonoscopy  . CYSTOSCOPY W/ URETERAL STENT PLACEMENT  2010   lt-lazer stone  . CYSTOSCOPY WITH STENT PLACEMENT Bilateral 09/08/2014   Procedure: CYSTOSCOPY WITH STENT PLACEMENT;  Surgeon: Malka So, MD;  Location: [facility];  Service: Urology;  Laterality: Bilateral;  . THORACOTOMY  2007   vatz-rt upper lobe  . TONSILLECTOMY       A IV Location/Drains/Wounds Patient Lines/Drains/Airways Status   Active Line/Drains/Airways    Name:   Placement date:   Placement time:   Site:   Days:   Peripheral IV 12/18/19 Left Forearm   12/18/19    1929    Forearm   1   Ureteral Drain/Stent Right ureter 6 Fr.   09/08/14    1250    Right ureter   1928   Ureteral Drain/Stent Left ureter 6 Fr.   09/08/14    1252    Left ureter   1928   External Urinary Catheter   12/18/19    2044    --   1   Incision (Closed) 09/08/14 Other (Comment) Other (Comment)   09/08/14    1311     1928          Intake/Output Last 24 hours No intake or output data in the 24 hours ending 12/19/19 0112  Labs/Imaging Results for orders placed or performed during the hospital encounter of 12/18/19 (  from the past 48 hour(s))  CBC with Differential     Status: Abnormal   Collection Time: 12/18/19  9:11 PM  Result Value Ref Range   WBC 9.2 4.0 - 10.5 K/uL   RBC 2.84 (L) 3.87 - 5.11 MIL/uL   Hemoglobin 9.2 (L) 12.0 - 15.0 g/dL   HCT 28.5 (L) 36.0 - 46.0 %   MCV 100.4 (H) 80.0 - 100.0 fL   MCH 32.4 26.0 - 34.0 pg   MCHC 32.3 30.0 - 36.0 g/dL   RDW 15.1 11.5 - 15.5 %   Platelets 95 (L) 150 - 400 K/uL    Comment: SPECIMEN CHECKED FOR CLOTS   nRBC 0.0 0.0 - 0.2 %   Neutrophils Relative % 83 %   Neutro Abs 7.6 1.7 - 7.7 K/uL   Lymphocytes Relative 3 %   Lymphs Abs 0.3 (L) 0.7 - 4.0 K/uL   Monocytes Relative 14 %   Monocytes Absolute 1.3 (H) 0.1 - 1.0 K/uL   Eosinophils Relative 0 %   Eosinophils Absolute 0.0 0.0 - 0.5 K/uL    Basophils Relative 0 %   Basophils Absolute 0.0 0.0 - 0.1 K/uL   Immature Granulocytes 0 %   Abs Immature Granulocytes 0.03 0.00 - 0.07 K/uL    Comment: Performed at [facility], [address], [place], [place] [address]  Comprehensive metabolic panel     Status: Abnormal   Collection Time: 12/18/19  9:11 PM  Result Value Ref Range   Sodium 131 (L) 135 - 145 mmol/L   Potassium 2.7 (LL) 3.5 - 5.1 mmol/L    Comment: CRITICAL RESULT CALLED TO, READ BACK BY AND VERIFIED WITH: HARDEN,J. AT 2217 ON 12/18/2019 BY EVA    Chloride 95 (L) 98 - 111 mmol/L   CO2 19 (L) 22 - 32 mmol/L   Glucose, Bld 72 70 - 99 mg/dL   BUN 21 8 - 23 mg/dL   Creatinine, Ser 1.26 (H) 0.44 - 1.00 mg/dL   Calcium 8.2 (L) 8.9 - 10.3 mg/dL   Total Protein 6.1 (L) 6.5 - 8.1 g/dL   Albumin 3.7 3.5 - 5.0 g/dL   AST 88 (H) 15 - 41 U/L   ALT 49 (H) 0 - 44 U/L   Alkaline Phosphatase 172 (H) 38 - 126 U/L   Total Bilirubin 2.0 (H) 0.3 - 1.2 mg/dL   GFR calc non Af Amer 41 (L) >60 mL/min   GFR calc Af Amer 47 (L) >60 mL/min   Anion gap 17 (H) 5 - 15    Comment: Performed at [facility], [address], [place], [place] [address]  Magnesium     Status: None   Collection Time: 12/18/19  9:11 PM  Result Value Ref Range   Magnesium 1.8 1.7 - 2.4 mg/dL    Comment: Performed at [facility], [address], [place], [place] [address]  Phosphorus     Status: Abnormal   Collection Time: 12/18/19  9:11 PM  Result Value Ref Range   Phosphorus 4.7 (H) 2.5 - 4.6 mg/dL    Comment: Performed at [facility], [address], [place], [place] [address]  Protime-INR     Status: None   Collection Time: 12/18/19  9:11 PM  Result Value Ref Range   Prothrombin Time 13.9 11.4 - 15.2 seconds   INR 1.1 0.8 - 1.2    Comment: (NOTE) INR goal varies based on device and disease states. Performed at [facility], [address] [place], [place] [address]  Ethanol     Status: None   Collection Time: 12/18/19  9:14 PM  Result Value Ref Range    Alcohol, Ethyl (B) <10 <10 mg/dL    Comment: (NOTE) Lowest detectable limit for serum alcohol is 10 mg/dL. For medical purposes only. Performed at [facility], [address], [place], [place] [address]    DG Pelvis 1-2 Views  Result Date: 12/18/2019 CLINICAL DATA:  Pain status post fall EXAM: PELVIS - 1-2 VIEW COMPARISON:  None. FINDINGS: There is no acute displaced fracture. No dislocation. Degenerative changes are noted of both hips and the lower lumbar spine. Multiple surgical clips project over the patient's pelvis. Osteopenia is noted. IMPRESSION: Negative. Electronically Signed   By: Constance Holster M.D.   On: 12/18/2019 21:56   CT Head Wo Contrast  Result Date: 12/18/2019 CLINICAL DATA:  Ataxia. Found on the floor. Possible stroke. Carcinoid tumor of the lung. EXAM: CT HEAD WITHOUT CONTRAST TECHNIQUE: Contiguous axial images were obtained from the base of the skull through the vertex without intravenous contrast. COMPARISON:  None. FINDINGS: Brain: Localized subarachnoid hemorrhage and right parietal sulci. Cannot rule out a minor cortical contusion. Otherwise, the brain shows generalized atrophy with chronic small-vessel ischemic changes of the white matter. No hydrocephalus. No subdural collection. Vascular: There is atherosclerotic calcification of the major vessels at the base of the brain. Skull: No skull fracture. Sinuses/Orbits: Clear/normal Other: Scalp hematoma overlying the region of subarachnoid hemorrhage. IMPRESSION: Small volume subarachnoid hemorrhage in right parietal sulci. Cannot rule out minor cortical contusion. No overlying skull fracture. Overlying scalp hematoma. Atrophy and chronic small-vessel ischemic changes elsewhere. These results were called by telephone at the time of interpretation on 12/18/2019 at 9:42 pm to provider [facility] , who verbally acknowledged these results. Electronically Signed   By: Nelson Chimes M.D.   On: 12/18/2019 21:43   DG Chest Port 1  View  Result Date: 12/18/2019 CLINICAL DATA:  Weakness, patient found down for unknown period of time EXAM: PORTABLE CHEST 1 VIEW COMPARISON:  Radiograph 09/08/2014, 01/08/2015; CT 12/28/2014 FINDINGS: Coarse reticular changes in the lung apices may reflect atelectasis and/or architectural distortion superimposed upon the first costovertebral junction. No consolidative opacity. No pneumothorax or visible effusion. The aorta is calcified. The remaining cardiomediastinal contours are unremarkable. Few remote rib deformities are similar to prior. No acute visible displaced rib fracture. High-riding appearance of the left humeral head with subchondral sclerosis and cystic change at the greater tuberosity likely reflect some rotator cuff insufficiency though should correlate for acute symptoms in the right shoulder. Dextrocurvature of the thoracolumbar junction is similar to prior. IMPRESSION: 1. No acute cardiopulmonary findings. 2. Remote rib deformities. 3. High-riding appearance of the left humeral head with subchondral sclerosis and cystic change at the greater tuberosity likely reflect some rotator cuff insufficiency. Though could correlate for acute symptoms. Electronically Signed   By: Lovena Le M.D.   On: 12/18/2019 21:02    Pending Labs Unresulted Labs (From admission, onward)    Start     Ordered   12/18/19 2051  Urine rapid drug screen (hosp performed)  ONCE - STAT,   STAT     12/18/19 2050   12/18/19 2034  SARS CORONAVIRUS 2 (TAT 6-24 HRS) Nasopharyngeal Nasopharyngeal Swab  (Tier 3 (TAT 6-24 hrs))  Once,   STAT    Question Answer Comment  Is this test for diagnosis or screening Screening   Symptomatic for COVID-19 as defined by CDC No   Hospitalized for COVID-19  No   Admitted to ICU for COVID-19 No   Previously tested for COVID-19 Yes   Resident in a congregate (group) care setting No   Employed in healthcare setting No   Pregnant No      12/18/19 2034   12/18/19 2032  Urinalysis,  Routine w reflex microscopic  ONCE - STAT,   STAT     12/18/19 2031          Vitals/Pain Today's Vitals   12/18/19 2330 12/19/19 0000 12/19/19 0007 12/19/19 0030  BP: 124/84 111/83 132/89 102/90  Pulse:   97 95  Resp: 16 (!) 22  17  Temp:      TempSrc:      SpO2:   100% 99%  Weight:      Height:      PainSc:        Isolation Precautions No active isolations  Medications Medications  potassium chloride 10 mEq in 100 mL IVPB (10 mEq Intravenous New Bag/Given 12/19/19 0034)  levETIRAcetam (KEPPRA) tablet 500 mg (500 mg Oral Given 12/18/19 2310)  0.9 % NaCl with KCl 40 mEq / L  infusion (has no administration in time range)  sodium chloride 0.9 % bolus 500 mL (0 mLs Intravenous Stopped 12/18/19 2250)  0.9 %  sodium chloride infusion ( Intravenous New Bag/Given 12/18/19 2035)  magnesium sulfate IVPB 2 g 50 mL (2 g Intravenous New Bag/Given 12/18/19 2347)    Mobility walks High fall risk   Focused Assessments    R Recommendations: See Admitting Provider Note  Report given to:   Additional Notes:   Pt preference: No ice in water. Prefers room temperature water with oral medications.

## 2019-12-19 NOTE — Progress Notes (Signed)
  NEUROSURGERY PROGRESS NOTE   Repeat head CT unchanged. No role for NS intervention. No need for repeat imaging unless exam changes. - complete 7 day course of keppra for seizure prophylaxis - F/U in clinic as needed  Please call for any concerns.

## 2019-12-19 NOTE — Plan of Care (Signed)
  Problem: Acute Rehab PT Goals(only PT should resolve) Goal: Pt Will Go Supine/Side To Sit Outcome: Progressing Flowsheets (Taken 12/19/2019 1522) Pt will go Supine/Side to Sit: with modified independence Goal: Patient Will Transfer Sit To/From Stand Outcome: Progressing Flowsheets (Taken 12/19/2019 1522) Patient will transfer sit to/from stand: with supervision Goal: Pt Will Transfer Bed To Chair/Chair To Bed Outcome: Progressing Flowsheets (Taken 12/19/2019 1522) Pt will Transfer Bed to Chair/Chair to Bed: with supervision Goal: Pt Will Ambulate Outcome: Progressing Flowsheets (Taken 12/19/2019 1522) Pt will Ambulate:  > 125 feet  with supervision  with rolling walker  with cane   3:23 PM, 12/19/19 Lonell Grandchild, MPT Physical Therapist with [facility] [phone] office [phone] mobile phone

## 2019-12-19 NOTE — Progress Notes (Signed)
Initial Nutrition Assessment  DOCUMENTATION CODES:      INTERVENTION:  -1 packet Juven BID-to support wound healing   -Ensure Enlive po BID, each supplement provides 350 kcal and 20 grams of protein   NUTRITION DIAGNOSIS:   Increased nutrient needs related to wound healing as evidenced by estimated needs.   GOAL:   Patient will meet greater than or equal to 90% of their needs  MONITOR:  Supplement acceptance, Skin, PO intake, Labs, Weight trends, I & O's   REASON FOR ASSESSMENT:   Consult Wound healing  ASSESSMENT: Patient is a 79 yo female with hx of alcholic liver dz, GERD and carcinoid bronchial adenoma right lung. Patient presents to ED after being found in the floor. History of falls. Critical potassium-2.7 yesterday, loose stools and hyponatremia. Patient has an unstageable pressure injury to her sacrum.  CT findings-small SAH right parietal region.  Patient reports has not been eating well and usually only takes in 2 meals daily. We talked about the importance of protein and wound healing. 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 With the onset of COVID her exercise routine changed as well as her meal pattern.  Labs reviewed: Glu 53 (L), Cr. 1.02, Sodium 132 (L).  Medications: Keppra  IV fluids: NS@ 75 ml/hr.  Intake/Output Summary (Last 24 hours) at 12/19/2019 1504 Last data filed at 12/19/2019 0648 Gross per 24 hour  Intake 1020.79 ml  Output --  Net 1020.79 ml   Weight history shows usual weight range of 42-45 kg the past 7 months.  NUTRITION - FOCUSED PHYSICAL EXAM:  Unable to complete Nutrition-Focused physical exam at this time.    Diet Order:   Diet Order             Diet Heart Room service appropriate? Yes; Fluid consistency: Thin  Diet effective now                EDUCATION NEEDS:  Not appropriate for education at this time   Skin:  Skin Assessment: Skin Integrity  Issues: Skin Integrity Issues:: Unstageable Unstageable: sacrum and multiple areas of brusing  Last BM:  1/18- incontinent  Height:   Ht Readings from Last 1 Encounters:  12/19/19 4\' 10"  (1.473 m)    Weight:   Wt Readings from Last 1 Encounters:  12/19/19 44.5 kg    Ideal Body Weight:   45 kg  BMI:  Body mass index is 20.5 kg/m.  Estimated Nutritional Needs:   Kcal:  2094-7096  Protein:  62-75 gr  Fluid:  >1300 ml daily   Colman Cater MS,RD,CSG,LDN Office: [phone] Pager: [phone]

## 2019-12-19 NOTE — Evaluation (Signed)
Physical Therapy Evaluation Patient Details Name: [name] MRN: [id number] DOB: 22-Mar-1941 Today's Date: 12/19/2019   History of Present Illness  [name] is a 79 y.o. female with medical history significant of anxiety, osteoarthritis, carcinoid bronchial adenoma right lung, bilateral carpal tunnel syndrome, depression, GERD, urolithiasis, osteoporosis, alcoholic liver disease who was brought to the emergency department after being found on the floor.  She is a heavy wine drinker.  She normally ambulates with a cane, but has been having difficulty with her balance and multiple falls recently.  The nursing staff reports that her gluteal area has some ecchymosis from.  She mentioned that her last time she had wine was on Thursday. She reports having a mild headache. She denies blurred vision, nausea or emesis.    Clinical Impression  Patient functioning near/at baseline for functional mobility and gait, unsteady on feet and required use RW for ambulation in room and hallways without loss of balance, limited secondary to c/o fatigue, incontinent of urine while walking and tolerated sitting up in chair after therapy - nursing staff aware.  Patient will benefit from continued physical therapy in hospital and recommended venue below to increase strength, balance, endurance for safe ADLs and gait.     Follow Up Recommendations Home health PT;Supervision - Intermittent;Supervision for mobility/OOB    Equipment Recommendations  Rolling walker with 5" wheels    Recommendations for Other Services       Precautions / Restrictions Precautions Precautions: Fall Restrictions Weight Bearing Restrictions: No      Mobility  Bed Mobility Overal bed mobility: Needs Assistance Bed Mobility: Supine to Sit     Supine to sit: Min guard     General bed mobility comments: increased time, labored movement, frequent rest breaks  Transfers Overall transfer level: Needs  assistance Equipment used: Rolling walker (2 wheeled) Transfers: Sit to/from [facility] Sit to Stand: Supervision Stand pivot transfers: Supervision       General transfer comment: slightly labored movement  Ambulation/Gait Ambulation/Gait assistance: Supervision;Min guard Gait Distance (Feet): 100 Feet Assistive device: Rolling walker (2 wheeled) Gait Pattern/deviations: Decreased step length - right;Decreased step length - left;Decreased stride length Gait velocity: near normal   General Gait Details: slightly labored cadence without loss of balance, limited secondary to fatigue and incontinent of urine  Stairs            Wheelchair Mobility    Modified Rankin (Stroke Patients Only)       Balance Overall balance assessment: Needs assistance Sitting-balance support: Feet supported;No upper extremity supported Sitting balance-Leahy Scale: Fair Sitting balance - Comments: fair/good seated at EOB   Standing balance support: During functional activity;Bilateral upper extremity supported Standing balance-Leahy Scale: Fair Standing balance comment: fair/good using RW                             Pertinent Vitals/Pain Pain Assessment: No/denies pain    Home Living Family/patient expects to be discharged to:: Private residence Living Arrangements: Alone Available Help at Discharge: Family;Available 24 hours/day Type of Home: House Home Access: Stairs to enter Entrance Stairs-Rails: None Entrance Stairs-Number of Steps: 2 Home Layout: One level Home Equipment: Cane - single point      Prior Function Level of Independence: Independent         Comments: Hydrographic surveyor, drives     Hand Dominance   Dominant Hand: Right    Extremity/Trunk Assessment   Upper Extremity Assessment Upper  Extremity Assessment: Overall WFL for tasks assessed    Lower Extremity Assessment Lower Extremity Assessment: Generalized weakness     Cervical / Trunk Assessment Cervical / Trunk Assessment: Normal  Communication   Communication: No difficulties  Cognition Arousal/Alertness: Awake/alert Behavior During Therapy: WFL for tasks assessed/performed Overall Cognitive Status: Within Functional Limits for tasks assessed                                 General Comments: slightly apprehensive      General Comments      Exercises     Assessment/Plan    PT Assessment Patient needs continued PT services  PT Problem List Decreased strength;Decreased activity tolerance;Decreased balance;Decreased mobility       PT Treatment Interventions Balance training;Gait training;Stair training;Functional mobility training;Therapeutic activities;Therapeutic exercise;Patient/family education    PT Goals (Current goals can be found in the Care Plan section)  Acute Rehab PT Goals Patient Stated Goal: return home with family to assist PT Goal Formulation: With patient Time For Goal Achievement: 12/23/19 Potential to Achieve Goals: Good    Frequency Min 3X/week   Barriers to discharge        Co-evaluation               AM-PAC PT "6 Clicks" Mobility  Outcome Measure Help needed turning from your back to your side while in a flat bed without using bedrails?: None Help needed moving from lying on your back to sitting on the side of a flat bed without using bedrails?: A Little Help needed moving to and from a bed to a chair (including a wheelchair)?: A Little Help needed standing up from a chair using your arms (e.g., wheelchair or bedside chair)?: A Little Help needed to walk in hospital room?: A Little Help needed climbing 3-5 steps with a railing? : A Little 6 Click Score: 19    End of Session   Activity Tolerance: Patient tolerated treatment well;Patient limited by fatigue Patient left: in chair;with call bell/phone within reach Nurse Communication: Mobility status PT Visit Diagnosis: Unsteadiness  on feet (R26.81);Other abnormalities of gait and mobility (R26.89);Muscle weakness (generalized) (M62.81)    Time: 7116-5790 PT Time Calculation (min) (ACUTE ONLY): 33 min   Charges:   PT Evaluation $PT Eval Moderate Complexity: 1 Mod PT Treatments $Therapeutic Activity: 23-37 mins        3:21 PM, 12/19/19 Lonell Grandchild, MPT Physical Therapist with [facility] 336 [phone] office [phone] mobile phone

## 2019-12-19 NOTE — Discharge Summary (Addendum)
Physician Discharge Summary  [name] BRA:[id number] DOB: Dec 02, 1940 DOA: 12/18/2019  PCP: Asencion Noble, MD  Admit date: 12/18/2019 Discharge date: 12/20/2019  Admitted From: Home Disposition:  Home   Recommendations for Outpatient Follow-up:  1. Follow up with PCP in 1-2 weeks 2. Please obtain BMP/CBC in one week   Home Health: YES--HHPT Equipment/Devices: RW with 5" wheels  Discharge Condition: Stable CODE STATUS: FULL Diet recommendation: Heart Healthy    Brief/Interim Summary: 79 year old female with a history of alcohol dependence, anxiety, alcoholic liver disease, osteoporosis presenting with increasing generalized weakness.  According to the patient, she has had increasing gait instability and feeling off balanced.  She stated that she decided to sit on the floor because of her imbalance in order to prevent falling and causing any trauma.  However she was unable to get up.  Her family found her on the floor and EMS was activated.  The patient attributes her worsening generalized weakness and instability resulting from the inability to do her water aerobics since March 2020.  Unfortunately, she continues to drink alcohol although it appears to be minimized when clarified with the patient's daughter.  The patient states that she only drinks 2 to 3 days/week.  She drinks boxed wine a.  Her last drink was 12/15/2019.  Daughter states that she has had some intermittent episodes of sobriety, but restarted drinking again in August 2020.  Patient herself denies any fever, chills, headache, chest pain, shortness breath, nausea, vomiting, abdominal pain, dysuria, hematuria.  She complains of loose stool which appears to be chronic.  However she was prescribed some type of antibiotic by her PCP 1 week prior to this admission.  She does not know the reason for which she required the antibiotic.  At baseline, the patient is able to ambulate with a cane, but she has been having to hold onto the  walls at her house secondary to her gait instability.  In the emergency department, CT of the brain showed a small volume subarachnoid hemorrhage in the right parietal sulci.  Neurosurgery was contacted and felt the patient could be managed nonoperatively.  Labs showed sodium 131, potassium 2.7, serum creatinine 1.26.  WBC 9.2, hemoglobin 9.2, platelets 95,000.  The patient was admitted for further evaluation and observation for subarachnoid bleed and electrolyte abnormalities.   Discharge Diagnoses:  Subarachnoid hemorrhage -EDP is discussed with neurosurgery--> continue nonoperative management-->recommended keppra x 7 days total -Repeat CT brain--unchanged/stable SAH -PT evaluation-->HHPT -mental status at baseline  Hyponatremia -Multifactorial including volume depletion and alcoholic liver disease -stable/improving  Hypokalemia -Repleted prior to discharge-->BMP in one week with PCP -Check magnesium--1.8  Loose stool/diarrhea -Check C. 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 No acute abnormality seen in the left knee. -uric acid--6.2 -treat empirically for gout -pt does not wish to stay in hospital for further work up -d/c home with prednisone x 4 more days -f/u PCP if not improvement  Alcohol dependence/alcoholic hepatitis/transaminasemia -Right upper quadrant ultrasound--Echogenic liver, nonspecific though often seen with steatosis; no ductal dilatation -Alcohol withdrawal protocol--no signs of withdrawal  Acute kidney injury -Secondary to volume depletion -Baseline creatinine 0.7-0.9 -Presented with serum creatinine 1.26 -Continue IV fluids-->improved--serum creatinine 0.65 on day of d/c  Generalized  weakness/deconditioning -TSH--4.337, FT4--1.07 -Serum S81--103 -Folic PRXY--58.5 -PT evaluation-->HHPT -Personally reviewed  EKG--sinus rhythm, lots of artifact at baseline -Personally reviewed chest x-ray--no consolidation or edema  Thrombocytopenia -Secondary to alcoholic liver disease -Work-up as discussed above -Monitor for signs of bleeding -stable/improving   Sacral pressure injury -unstageable -present on admission -local wound care    Discharge Instructions   Allergies as of 12/20/2019      Reactions   Clindamycin Rash   Doxycycline Hyclate Rash      Medication List    STOP taking these medications   alendronate 70 MG tablet Commonly known as: FOSAMAX   cefdinir 300 MG capsule Commonly known as: OMNICEF   chlordiazePOXIDE 25 MG capsule Commonly known as: LIBRIUM     TAKE these medications   Biotin 2.5 MG Tabs Take 1 tablet by mouth daily.   cyanocobalamin 1000 MCG/ML injection Commonly known as: (VITAMIN B-12) Inject 1,000 mcg into the skin every 30 (thirty) days.   cycloSPORINE 0.05 % ophthalmic emulsion Commonly known as: RESTASIS Place 1 drop into both eyes 2 (two) times daily.   denosumab 60 MG/ML Sosy injection Commonly known as: PROLIA Inject 60 mg into the skin every 6 (six) months.   levETIRAcetam 500 MG tablet Commonly known as: KEPPRA Take 1 tablet (500 mg total) by mouth 2 (two) times daily.   multivitamin capsule Take 1 capsule by mouth daily.   potassium citrate 10 MEQ (1080 MG) SR tablet Commonly known as: UROCIT-K Take 10 mEq by mouth daily.   thiamine 100 MG tablet Take 100 mg by mouth daily.            Durable Medical Equipment  (From admission, onward)         Start     Ordered   12/19/19 1701  For home use only DME Walker rolling  Once    Question Answer Comment  Walker: With 5 Inch Wheels   Patient needs a walker to treat with the following condition Gait instability      12/19/19 1701          Follow-up Information    [facility] Follow up.   Why: Rolling walker       [facility] Follow up.   Specialty: Home Health Services Why:  PT         Allergies  Allergen Reactions  . Clindamycin Rash  . Doxycycline Hyclate Rash    Consultations:  none   Procedures/Studies: DG Pelvis 1-2 Views  Result Date: 12/18/2019 CLINICAL DATA:  Pain status post fall EXAM: PELVIS - 1-2 VIEW COMPARISON:  None. FINDINGS: There is no acute displaced fracture. No dislocation. Degenerative changes are noted of both hips and the lower lumbar spine. Multiple surgical clips project over the patient's pelvis. Osteopenia is noted. IMPRESSION: Negative. Electronically Signed   By: Constance Holster M.D.   On: 12/18/2019 21:56   CT HEAD WO CONTRAST  Result Date: 12/19/2019 CLINICAL DATA:  Subarachnoid hemorrhage, follow-up. EXAM: CT HEAD WITHOUT CONTRAST TECHNIQUE: Contiguous axial images were obtained from the base of the skull through the vertex without intravenous contrast. COMPARISON:  Head CT 12/18/2019 FINDINGS: Brain: Again demonstrated is small volume acute subarachnoid hemorrhage along the right parietal lobe. As before, it is difficult to exclude a minor underlying cortical contusion. No evidence of significant interval hemorrhage. No midline shift. No hydrocephalus. Stable background generalized parenchymal atrophy and chronic small vessel ischemic disease. Vascular: No hyperdense vessel.  Atherosclerotic calcifications. Skull: Normal. Negative for fracture or focal lesion. Sinuses/Orbits: Visualized orbits demonstrate no acute abnormality. Minimal ethmoid  sinus mucosal thickening. No significant mastoid effusion at the imaged levels. Other: Redemonstrated small right parietal scalp hematoma. IMPRESSION: Stable small volume acute subarachnoid hemorrhage along the right parietal lobe. As before, it is difficult to exclude a minor underlying cortical contusion. Stable  background generalized parenchymal atrophy and chronic small vessel ischemic disease. Electronically Signed   By: Kellie Simmering DO   On: 12/19/2019 11:14   CT Head Wo Contrast  Result Date: 12/18/2019 CLINICAL DATA:  Ataxia. Found on the floor. Possible stroke. Carcinoid tumor of the lung. EXAM: CT HEAD WITHOUT CONTRAST TECHNIQUE: Contiguous axial images were obtained from the base of the skull through the vertex without intravenous contrast. COMPARISON:  None. FINDINGS: Brain: Localized subarachnoid hemorrhage and right parietal sulci. Cannot rule out a minor cortical contusion. Otherwise, the brain shows generalized atrophy with chronic small-vessel ischemic changes of the white matter. No hydrocephalus. No subdural collection. Vascular: There is atherosclerotic calcification of the major vessels at the base of the brain. Skull: No skull fracture. Sinuses/Orbits: Clear/normal Other: Scalp hematoma overlying the region of subarachnoid hemorrhage. IMPRESSION: Small volume subarachnoid hemorrhage in right parietal sulci. Cannot rule out minor cortical contusion. No overlying skull fracture. Overlying scalp hematoma. Atrophy and chronic small-vessel ischemic changes elsewhere. These results were called by telephone at the time of interpretation on 12/18/2019 at 9:42 pm to provider [facility] , who verbally acknowledged these results. Electronically Signed   By: Nelson Chimes M.D.   On: 12/18/2019 21:43   DG Chest Port 1 View  Result Date: 12/18/2019 CLINICAL DATA:  Weakness, patient found down for unknown period of time EXAM: PORTABLE CHEST 1 VIEW COMPARISON:  Radiograph 09/08/2014, 01/08/2015; CT 12/28/2014 FINDINGS: Coarse reticular changes in the lung apices may reflect atelectasis and/or architectural distortion superimposed upon the first costovertebral junction. No consolidative opacity. No pneumothorax or visible effusion. The aorta is calcified. The remaining cardiomediastinal contours are unremarkable.  Few remote rib deformities are similar to prior. No acute visible displaced rib fracture. High-riding appearance of the left humeral head with subchondral sclerosis and cystic change at the greater tuberosity likely reflect some rotator cuff insufficiency though should correlate for acute symptoms in the right shoulder. Dextrocurvature of the thoracolumbar junction is similar to prior. IMPRESSION: 1. No acute cardiopulmonary findings. 2. Remote rib deformities. 3. High-riding appearance of the left humeral head with subchondral sclerosis and cystic change at the greater tuberosity likely reflect some rotator cuff insufficiency. Though could correlate for acute symptoms. Electronically Signed   By: Lovena Le M.D.   On: 12/18/2019 21:02   US Abdomen Limited RUQ  Result Date: 12/19/2019 CLINICAL DATA:  Transaminasemia. Hyperbilirubinemia. EXAM: ULTRASOUND ABDOMEN LIMITED RIGHT UPPER QUADRANT COMPARISON:  CT abdomen and pelvis 07/18/2016 FINDINGS: Gallbladder: No gallstones or wall thickening visualized. No sonographic Murphy sign noted by sonographer. Common bile duct: Diameter: 5 mm Liver: Diffusely increased parenchymal echogenicity without a focal lesion identified though with assessment limited by bowel gas and patient body habitus. Portal vein is patent on color Doppler imaging with normal direction of blood flow towards the liver. Other: None. IMPRESSION: 1. Echogenic liver, nonspecific though often seen with steatosis. 2. No biliary dilatation or gallstones. Electronically Signed   By: Logan Bores M.D.   On: 12/19/2019 11:01        Discharge Exam: Vitals:   12/20/19 0541 12/20/19 0802  BP: 133/82   Pulse: 95   Resp: 16   Temp: 97.8 F (36.6 C)   SpO2: 98% 96%   Vitals:  12/19/19 1938 12/19/19 2043 12/20/19 0541 12/20/19 0802  BP:  129/74 133/82   Pulse:  91 95   Resp:  18 16   Temp:  97.7 F (36.5 C) 97.8 F (36.6 C)   TempSrc:  Oral Oral   SpO2: 97% 97% 98% 96%  Weight:        Height:        General: Pt is alert, awake, not in acute distress Cardiovascular: RRR, S1/S2 +, no rubs, no gallops Respiratory: CTA bilaterally, no wheezing, no rhonchi Abdominal: Soft, NT, ND, bowel sounds + Extremities: no edema, no cyanosis; mild warmth of left knee without erythema or crepitance   The results of significant diagnostics from this hospitalization (including imaging, microbiology, ancillary and laboratory) are listed below for reference.    Significant Diagnostic Studies: DG Pelvis 1-2 Views  Result Date: 12/18/2019 CLINICAL DATA:  Pain status post fall EXAM: PELVIS - 1-2 VIEW COMPARISON:  None. FINDINGS: There is no acute displaced fracture. No dislocation. Degenerative changes are noted of both hips and the lower lumbar spine. Multiple surgical clips project over the patient's pelvis. Osteopenia is noted. IMPRESSION: Negative. Electronically Signed   By: Constance Holster M.D.   On: 12/18/2019 21:56   CT HEAD WO CONTRAST  Result Date: 12/19/2019 CLINICAL DATA:  Subarachnoid hemorrhage, follow-up. EXAM: CT HEAD WITHOUT CONTRAST TECHNIQUE: Contiguous axial images were obtained from the base of the skull through the vertex without intravenous contrast. COMPARISON:  Head CT 12/18/2019 FINDINGS: Brain: Again demonstrated is small volume acute subarachnoid hemorrhage along the right parietal lobe. As before, it is difficult to exclude a minor underlying cortical contusion. No evidence of significant interval hemorrhage. No midline shift. No hydrocephalus. Stable background generalized parenchymal atrophy and chronic small vessel ischemic disease. Vascular: No hyperdense vessel.  Atherosclerotic calcifications. Skull: Normal. Negative for fracture or focal lesion. Sinuses/Orbits: Visualized orbits demonstrate no acute abnormality. Minimal ethmoid sinus mucosal thickening. No significant mastoid effusion at the imaged levels. Other: Redemonstrated small right parietal scalp  hematoma. IMPRESSION: Stable small volume acute subarachnoid hemorrhage along the right parietal lobe. As before, it is difficult to exclude a minor underlying cortical contusion. Stable background generalized parenchymal atrophy and chronic small vessel ischemic disease. Electronically Signed   By: Kellie Simmering DO   On: 12/19/2019 11:14   CT Head Wo Contrast  Result Date: 12/18/2019 CLINICAL DATA:  Ataxia. Found on the floor. Possible stroke. Carcinoid tumor of the lung. EXAM: CT HEAD WITHOUT CONTRAST TECHNIQUE: Contiguous axial images were obtained from the base of the skull through the vertex without intravenous contrast. COMPARISON:  None. FINDINGS: Brain: Localized subarachnoid hemorrhage and right parietal sulci. Cannot rule out a minor cortical contusion. Otherwise, the brain shows generalized atrophy with chronic small-vessel ischemic changes of the white matter. No hydrocephalus. No subdural collection. Vascular: There is atherosclerotic calcification of the major vessels at the base of the brain. Skull: No skull fracture. Sinuses/Orbits: Clear/normal Other: Scalp hematoma overlying the region of subarachnoid hemorrhage. IMPRESSION: Small volume subarachnoid hemorrhage in right parietal sulci. Cannot rule out minor cortical contusion. No overlying skull fracture. Overlying scalp hematoma. Atrophy and chronic small-vessel ischemic changes elsewhere. These results were called by telephone at the time of interpretation on 12/18/2019 at 9:42 pm to provider [facility] , who verbally acknowledged these results. Electronically Signed   By: Nelson Chimes M.D.   On: 12/18/2019 21:43   DG Chest Port 1 View  Result Date: 12/18/2019 CLINICAL DATA:  Weakness, patient found down for  unknown period of time EXAM: PORTABLE CHEST 1 VIEW COMPARISON:  Radiograph 09/08/2014, 01/08/2015; CT 12/28/2014 FINDINGS: Coarse reticular changes in the lung apices may reflect atelectasis and/or architectural distortion superimposed  upon the first costovertebral junction. No consolidative opacity. No pneumothorax or visible effusion. The aorta is calcified. The remaining cardiomediastinal contours are unremarkable. Few remote rib deformities are similar to prior. No acute visible displaced rib fracture. High-riding appearance of the left humeral head with subchondral sclerosis and cystic change at the greater tuberosity likely reflect some rotator cuff insufficiency though should correlate for acute symptoms in the right shoulder. Dextrocurvature of the thoracolumbar junction is similar to prior. IMPRESSION: 1. No acute cardiopulmonary findings. 2. Remote rib deformities. 3. High-riding appearance of the left humeral head with subchondral sclerosis and cystic change at the greater tuberosity likely reflect some rotator cuff insufficiency. Though could correlate for acute symptoms. Electronically Signed   By: Lovena Le M.D.   On: 12/18/2019 21:02   US Abdomen Limited RUQ  Result Date: 12/19/2019 CLINICAL DATA:  Transaminasemia. Hyperbilirubinemia. EXAM: ULTRASOUND ABDOMEN LIMITED RIGHT UPPER QUADRANT COMPARISON:  CT abdomen and pelvis 07/18/2016 FINDINGS: Gallbladder: No gallstones or wall thickening visualized. No sonographic Murphy sign noted by sonographer. Common bile duct: Diameter: 5 mm Liver: Diffusely increased parenchymal echogenicity without a focal lesion identified though with assessment limited by bowel gas and patient body habitus. Portal vein is patent on color Doppler imaging with normal direction of blood flow towards the liver. Other: None. IMPRESSION: 1. Echogenic liver, nonspecific though often seen with steatosis. 2. No biliary dilatation or gallstones. Electronically Signed   By: Logan Bores M.D.   On: 12/19/2019 11:01     Microbiology: Recent Results (from the past 240 hour(s))  SARS CORONAVIRUS 2 (Nishita Isaacks 6-24 HRS) Nasopharyngeal Nasopharyngeal Swab     Status: None   Collection Time: 12/18/19  8:47 PM    Specimen: Nasopharyngeal Swab  Result Value Ref Range Status   SARS Coronavirus 2 NEGATIVE NEGATIVE Final    Comment: (NOTE) SARS-CoV-2 target nucleic acids are NOT DETECTED. The SARS-CoV-2 RNA is generally detectable in upper and lower respiratory specimens during the acute phase of infection. Negative results do not preclude SARS-CoV-2 infection, do not rule out co-infections with other pathogens, and should not be used as the sole basis for treatment or other patient management decisions. Negative results must be combined with clinical observations, patient history, and epidemiological information. The expected result is Negative. Fact Sheet for Patients: SugarRoll.be Fact Sheet for Healthcare Providers: https://www.woods-mathews.com/ This test is not yet approved or cleared by the [place] FDA and  has been authorized for detection and/or diagnosis of SARS-CoV-2 by FDA under an Emergency Use Authorization (EUA). This EUA will remain  in effect (meaning this test can be used) for the duration of the COVID-19 declaration under Section 56 4(b)(1) of the Act, 21 U.S.C. section 360bbb-3(b)(1), unless the authorization is terminated or revoked sooner. Performed at [facility], [place] [address] [place], [place] [address]   MRSA PCR Screening     Status: None   Collection Time: 12/19/19 10:14 AM   Specimen: Nasal Mucosa; Nasopharyngeal  Result Value Ref Range Status   MRSA by PCR NEGATIVE NEGATIVE Final    Comment:        The GeneXpert MRSA Assay (FDA approved for NASAL specimens only), is one component of a comprehensive MRSA colonization surveillance program. It is not intended to diagnose MRSA infection nor to guide or monitor treatment for MRSA  infections. Performed at [facility], [address], [place], [place] [address]   C difficile quick scan w PCR reflex     Status: None   Collection Time: 12/19/19  1:51 PM    Specimen: Urine, Random; Stool  Result Value Ref Range Status   C Diff antigen NEGATIVE NEGATIVE Final   C Diff toxin NEGATIVE NEGATIVE Final   C Diff interpretation No C. difficile detected.  Final    Comment: Performed at [facility], [address] [place], [place] [address]     Labs: Basic Metabolic Panel: Recent Labs  Lab 12/18/19 2111 12/18/19 2111 12/19/19 0502 12/20/19 0622  NA 131*  --  132* 133*  K 2.7*   < > 3.7 2.7*  CL 95*  --  102 107  CO2 19*  --  16* 19*  GLUCOSE 72  --  53* 87  BUN 21  --  16 11  CREATININE 1.26*  --  1.02* 0.65  CALCIUM 8.2*  --  7.4* 7.3*  MG 1.8  --   --  2.1  PHOS 4.7*  --   --   --    < > = values in this interval not displayed.   Liver Function Tests: Recent Labs  Lab 12/18/19 2111 12/19/19 0502 12/20/19 0622  AST 88* 72* 56*  ALT 49* 43 38  ALKPHOS 172* 143* 144*  BILITOT 2.0* 1.3* 1.1  PROT 6.1* 5.4* 5.4*  ALBUMIN 3.7 3.2* 3.0*   No results for input(s): LIPASE, AMYLASE in the last 168 hours. No results for input(s): AMMONIA in the last 168 hours. CBC: Recent Labs  Lab 12/18/19 2111 12/19/19 0502 12/20/19 0622  WBC 9.2 7.5 6.0  NEUTROABS 7.6 5.7  --   HGB 9.2* 8.4* 8.2*  HCT 28.5* 26.1* 25.6*  MCV 100.4* 101.2* 101.2*  PLT 95* 101* 140*   Cardiac Enzymes: Recent Labs  Lab 12/19/19 0502  CKTOTAL 281*   BNP: Invalid input(s): POCBNP CBG: No results for input(s): GLUCAP in the last 168 hours.  Time coordinating discharge:  36 minutes  Signed:  Orson Eva, DO [facility] Pager: [phone] 12/20/2019, 9:14 AM

## 2019-12-19 NOTE — Progress Notes (Signed)
Air mattress placed in room 313 for pt per Sobieski orders. -

## 2019-12-19 NOTE — Progress Notes (Signed)
PROGRESS NOTE  [name] YIF:[id number] DOB: 1941/10/01 DOA: 12/18/2019 PCP: Asencion Noble, MD  Brief History:  79 year old female with a history of alcohol dependence, anxiety, alcoholic liver disease, osteoporosis presenting with increasing generalized weakness.  According to the patient, she has had increasing gait instability and feeling off balanced.  She stated that she decided to sit on the floor because of her imbalance in order to prevent falling and causing any trauma.  However she was unable to get up.  Her family found her on the floor and EMS was activated.  The patient attributes her worsening generalized weakness and instability resulting from the inability to do her water aerobics since March 2020.  Unfortunately, she continues to drink alcohol although it appears to be minimized when clarified with the patient's daughter.  The patient states that she only drinks 2 to 3 days/week.  She drinks boxed wine a.  Her last drink was 12/15/2019.  Daughter states that she has had some intermittent episodes of sobriety, but restarted drinking again in August 2020.  Patient herself denies any fever, chills, headache, chest pain, shortness breath, nausea, vomiting, abdominal pain, dysuria, hematuria.  She complains of loose stool which appears to be chronic.  However she was prescribed some type of antibiotic by her PCP 1 week prior to this admission.  She does not know the reason for which she required the antibiotic.  At baseline, the patient is able to ambulate with a cane, but she has been having to hold onto the walls at her house secondary to her gait instability.  In the emergency department, CT of the brain showed a small volume subarachnoid hemorrhage in the right parietal sulci.  Neurosurgery was contacted and felt the patient could be managed nonoperatively.  Labs showed sodium 131, potassium 2.7, serum creatinine 1.26.  WBC 9.2, hemoglobin 9.2, platelets 95,000.  The patient was  admitted for further evaluation and observation for subarachnoid bleed and electrolyte abnormalities.  Assessment/Plan: Subarachnoid hemorrhage -EDP is discussed with neurosurgery--> continue nonoperative management-->recommended keppra x 7 days -Repeat CT brain -PT evaluation  Hyponatremia -Multifactorial including volume depletion and alcoholic liver disease  Hypokalemia -Replete -Check magnesium--1.8  Loose stool/diarrhea -Check C. difficile -Stool pathogen panel  Alcohol dependence/alcoholic hepatitis/transaminasemia -Right upper quadrant ultrasound -Alcohol withdrawal protocol  Acute kidney injury -Secondary to volume depletion -Baseline creatinine 0.7-0.9 -Presented with serum creatinine 1.26 -Continue IV fluids  Generalized weakness/deconditioning -TSH -Serum O67 -Folic acid -PT evaluation -Personally reviewed EKG--sinus rhythm, lots of artifact at baseline -Personally reviewed chest x-ray--no consolidation or edema  Thrombocytopenia -Secondary to alcoholic liver disease -Work-up as discussed above -Monitor for signs of bleeding   Sacral pressure injury -unstageable -present on admission -local wound care    Disposition Plan:   Home vs SNF 1/19 Family Communication:   Daughter updated 1/18  Consultants:  neurosurgery  Code Status:  FULL  DVT Prophylaxis:  SCDs   Procedures: As Listed in Progress Note Above  Antibiotics: None      Subjective: Patient denies fevers, chills, headache, chest pain, dyspnea, nausea, vomiting, diarrhea, abdominal pain, dysuria, hematuria, hematochezia, and melena.   Objective: Vitals:   12/19/19 0530 12/19/19 0557 12/19/19 0600 12/19/19 0700  BP: 108/67 92/63 119/66 119/77  Pulse:  (!) 106 99   Resp: 12 16 18 11   Temp:  97.8 F (36.6 C)    TempSrc:  Oral    SpO2:  100% 100%   Weight:  44.5  kg    Height:  4\' 10"  (1.473 m)      Intake/Output Summary (Last 24 hours) at 12/19/2019 0746 Last data  filed at 12/19/2019 0648 Gross per 24 hour  Intake 1020.79 ml  Output --  Net 1020.79 ml   Weight change:  Exam:   General:  Pt is alert, follows commands appropriately, not in acute distress  HEENT: No icterus, No thrush, No neck mass, [place]/AT  Cardiovascular: RRR, S1/S2, no rubs, no gallops  Respiratory: CTA bilaterally, no wheezing, no crackles, no rhonchi  Abdomen: Soft/+BS, non tender, non distended, no guarding  Extremities: No edema, No lymphangitis, No petechiae, No rashes, no synovitis  -Neuro:  CN II-XII intact, strength 4/5 in RUE, RLE, strength 4/5 LUE, LLE; sensation intact bilateral; no dysmetria; babinski equivocal     Data Reviewed: I have personally reviewed following labs and imaging studies Basic Metabolic Panel: Recent Labs  Lab 12/18/19 2111 12/19/19 0502  NA 131* 132*  K 2.7* 3.7  CL 95* 102  CO2 19* 16*  GLUCOSE 72 53*  BUN 21 16  CREATININE 1.26* 1.02*  CALCIUM 8.2* 7.4*  MG 1.8  --   PHOS 4.7*  --    Liver Function Tests: Recent Labs  Lab 12/18/19 2111 12/19/19 0502  AST 88* 72*  ALT 49* 43  ALKPHOS 172* 143*  BILITOT 2.0* 1.3*  PROT 6.1* 5.4*  ALBUMIN 3.7 3.2*   No results for input(s): LIPASE, AMYLASE in the last 168 hours. No results for input(s): AMMONIA in the last 168 hours. Coagulation Profile: Recent Labs  Lab 12/18/19 2111  INR 1.1   CBC: Recent Labs  Lab 12/18/19 2111 12/19/19 0502  WBC 9.2 7.5  NEUTROABS 7.6 5.7  HGB 9.2* 8.4*  HCT 28.5* 26.1*  MCV 100.4* 101.2*  PLT 95* 101*   Cardiac Enzymes: No results for input(s): CKTOTAL, CKMB, CKMBINDEX, TROPONINI in the last 168 hours. BNP: Invalid input(s): POCBNP CBG: No results for input(s): GLUCAP in the last 168 hours. HbA1C: No results for input(s): HGBA1C in the last 72 hours. Urine analysis:    Component Value Date/Time   COLORURINE YELLOW 09/08/2014 0300   APPEARANCEUR CLEAR 09/08/2014 0300   LABSPEC 1.015 09/08/2014 0300   PHURINE 5.5  09/08/2014 0300   GLUCOSEU NEGATIVE 09/08/2014 0300   HGBUR LARGE (A) 09/08/2014 0300   BILIRUBINUR NEGATIVE 09/08/2014 0300   KETONESUR NEGATIVE 09/08/2014 0300   PROTEINUR NEGATIVE 09/08/2014 0300   UROBILINOGEN 0.2 09/08/2014 0300   NITRITE NEGATIVE 09/08/2014 0300   LEUKOCYTESUR SMALL (A) 09/08/2014 0300   Sepsis Labs: @LABRCNTIP (procalcitonin:4,lacticidven:4) )No results found for this or any previous visit (from the past 240 hour(s)).   Scheduled Meds: . Chlorhexidine Gluconate Cloth  6 each Topical Q0600  . levETIRAcetam  500 mg Oral BID   Continuous Infusions: . 0.9 % NaCl with KCl 40 mEq / L 100 mL/hr at 12/19/19 5784    Procedures/Studies: DG Pelvis 1-2 Views  Result Date: 12/18/2019 CLINICAL DATA:  Pain status post fall EXAM: PELVIS - 1-2 VIEW COMPARISON:  None. FINDINGS: There is no acute displaced fracture. No dislocation. Degenerative changes are noted of both hips and the lower lumbar spine. Multiple surgical clips project over the patient's pelvis. Osteopenia is noted. IMPRESSION: Negative. Electronically Signed   By: Constance Holster M.D.   On: 12/18/2019 21:56   CT Head Wo Contrast  Result Date: 12/18/2019 CLINICAL DATA:  Ataxia. Found on the floor. Possible stroke. Carcinoid tumor of the lung. EXAM: CT HEAD  WITHOUT CONTRAST TECHNIQUE: Contiguous axial images were obtained from the base of the skull through the vertex without intravenous contrast. COMPARISON:  None. FINDINGS: Brain: Localized subarachnoid hemorrhage and right parietal sulci. Cannot rule out a minor cortical contusion. Otherwise, the brain shows generalized atrophy with chronic small-vessel ischemic changes of the white matter. No hydrocephalus. No subdural collection. Vascular: There is atherosclerotic calcification of the major vessels at the base of the brain. Skull: No skull fracture. Sinuses/Orbits: Clear/normal Other: Scalp hematoma overlying the region of subarachnoid hemorrhage. IMPRESSION:  Small volume subarachnoid hemorrhage in right parietal sulci. Cannot rule out minor cortical contusion. No overlying skull fracture. Overlying scalp hematoma. Atrophy and chronic small-vessel ischemic changes elsewhere. These results were called by telephone at the time of interpretation on 12/18/2019 at 9:42 pm to provider [facility] , who verbally acknowledged these results. Electronically Signed   By: Nelson Chimes M.D.   On: 12/18/2019 21:43   DG Chest Port 1 View  Result Date: 12/18/2019 CLINICAL DATA:  Weakness, patient found down for unknown period of time EXAM: PORTABLE CHEST 1 VIEW COMPARISON:  Radiograph 09/08/2014, 01/08/2015; CT 12/28/2014 FINDINGS: Coarse reticular changes in the lung apices may reflect atelectasis and/or architectural distortion superimposed upon the first costovertebral junction. No consolidative opacity. No pneumothorax or visible effusion. The aorta is calcified. The remaining cardiomediastinal contours are unremarkable. Few remote rib deformities are similar to prior. No acute visible displaced rib fracture. High-riding appearance of the left humeral head with subchondral sclerosis and cystic change at the greater tuberosity likely reflect some rotator cuff insufficiency though should correlate for acute symptoms in the right shoulder. Dextrocurvature of the thoracolumbar junction is similar to prior. IMPRESSION: 1. No acute cardiopulmonary findings. 2. Remote rib deformities. 3. High-riding appearance of the left humeral head with subchondral sclerosis and cystic change at the greater tuberosity likely reflect some rotator cuff insufficiency. Though could correlate for acute symptoms. Electronically Signed   By: Lovena Le M.D.   On: 12/18/2019 21:02    Orson Eva, DO  [facility] Pager [phone]  If 7PM-7AM, please contact night-coverage [email or web address] Password [facility] 12/19/2019, 7:46 AM   LOS: 0 days

## 2019-12-19 NOTE — Progress Notes (Signed)
Report called and given to [facility] on Dept 300. Pt to be transported to room 313 once patient is done eating lunch.

## 2019-12-20 ENCOUNTER — Inpatient Hospital Stay (HOSPITAL_COMMUNITY): Payer: Medicare Other

## 2019-12-20 DIAGNOSIS — M1712 Unilateral primary osteoarthritis, left knee: Secondary | ICD-10-CM

## 2019-12-20 DIAGNOSIS — M25562 Pain in left knee: Secondary | ICD-10-CM

## 2019-12-20 DIAGNOSIS — F1020 Alcohol dependence, uncomplicated: Secondary | ICD-10-CM

## 2019-12-20 LAB — BASIC METABOLIC PANEL
Anion gap: 6 (ref 5–15)
BUN: 9 mg/dL (ref 8–23)
CO2: 19 mmol/L — ABNORMAL LOW (ref 22–32)
Calcium: 7.1 mg/dL — ABNORMAL LOW (ref 8.9–10.3)
Chloride: 105 mmol/L (ref 98–111)
Creatinine, Ser: 0.67 mg/dL (ref 0.44–1.00)
GFR calc Af Amer: >60 mL/min (ref >60)
GFR calc non Af Amer: >60 mL/min (ref >60)
Glucose, Bld: 119 mg/dL — ABNORMAL HIGH (ref 70–99)
Potassium: 4 mmol/L (ref 3.5–5.1)
Sodium: 130 mmol/L — ABNORMAL LOW (ref 135–145)

## 2019-12-20 LAB — COMPREHENSIVE METABOLIC PANEL
ALT: 38 U/L (ref 0–44)
AST: 56 U/L — ABNORMAL HIGH (ref 15–41)
Albumin: 3 g/dL — ABNORMAL LOW (ref 3.5–5.0)
Alkaline Phosphatase: 144 U/L — ABNORMAL HIGH (ref 38–126)
Anion gap: 7 (ref 5–15)
BUN: 11 mg/dL (ref 8–23)
CO2: 19 mmol/L — ABNORMAL LOW (ref 22–32)
Calcium: 7.3 mg/dL — ABNORMAL LOW (ref 8.9–10.3)
Chloride: 107 mmol/L (ref 98–111)
Creatinine, Ser: 0.65 mg/dL (ref 0.44–1.00)
GFR calc Af Amer: >60 mL/min (ref >60)
GFR calc non Af Amer: >60 mL/min (ref >60)
Glucose, Bld: 87 mg/dL (ref 70–99)
Potassium: 2.7 mmol/L — CL (ref 3.5–5.1)
Sodium: 133 mmol/L — ABNORMAL LOW (ref 135–145)
Total Bilirubin: 1.1 mg/dL (ref 0.3–1.2)
Total Protein: 5.4 g/dL — ABNORMAL LOW (ref 6.5–8.1)

## 2019-12-20 LAB — CBC
HCT: 25.6 % — ABNORMAL LOW (ref 36.0–46.0)
Hemoglobin: 8.2 g/dL — ABNORMAL LOW (ref 12.0–15.0)
MCH: 32.4 pg (ref 26.0–34.0)
MCHC: 32 g/dL (ref 30.0–36.0)
MCV: 101.2 fL — ABNORMAL HIGH (ref 80.0–100.0)
Platelets: 140 10*3/uL — ABNORMAL LOW (ref 150–400)
RBC: 2.53 MIL/uL — ABNORMAL LOW (ref 3.87–5.11)
RDW: 16.2 % — ABNORMAL HIGH (ref 11.5–15.5)
WBC: 6 10*3/uL (ref 4.0–10.5)
nRBC: 0 % (ref 0.0–0.2)

## 2019-12-20 LAB — MAGNESIUM: Magnesium: 2.1 mg/dL (ref 1.7–2.4)

## 2019-12-20 LAB — URIC ACID: Uric Acid, Serum: 6.2 mg/dL (ref 2.5–7.1)

## 2019-12-20 MED ORDER — METHYLPREDNISOLONE SODIUM SUCC 125 MG IJ SOLR
60.0000 mg | Freq: Once | INTRAMUSCULAR | Status: AC
  Administered 2019-12-20: 60 mg via INTRAVENOUS
  Filled 2019-12-20: qty 2

## 2019-12-20 MED ORDER — PREDNISONE 20 MG PO TABS: 40.0000 mg | ORAL_TABLET | Freq: Every day | ORAL | 0 refills | Status: DC

## 2019-12-20 MED ORDER — ACETAMINOPHEN 325 MG PO TABS
650.0000 mg | ORAL_TABLET | Freq: Four times a day (QID) | ORAL | Status: DC | PRN
  Administered 2019-12-20: 650 mg via ORAL
  Filled 2019-12-20: qty 2

## 2019-12-20 MED ORDER — ONDANSETRON HCL 4 MG/2ML IJ SOLN: 4.0000 mg | Freq: Four times a day (QID) | INTRAMUSCULAR | Status: DC | PRN

## 2019-12-20 MED ORDER — PREDNISONE 20 MG PO TABS: 40.0000 mg | ORAL_TABLET | Freq: Every day | ORAL | Status: DC

## 2019-12-20 MED ORDER — LEVETIRACETAM 500 MG PO TABS: 500.0000 mg | ORAL_TABLET | Freq: Two times a day (BID) | ORAL | 0 refills | Status: DC

## 2019-12-20 MED ORDER — POTASSIUM CHLORIDE CRYS ER 20 MEQ PO TBCR
40.0000 meq | EXTENDED_RELEASE_TABLET | Freq: Once | ORAL | Status: AC
  Administered 2019-12-20: 40 meq via ORAL
  Filled 2019-12-20: qty 2

## 2019-12-20 MED ORDER — POTASSIUM CHLORIDE 10 MEQ/100ML IV SOLN
10.0000 meq | INTRAVENOUS | Status: AC
  Administered 2019-12-20 (×3): 10 meq via INTRAVENOUS
  Filled 2019-12-20 (×3): qty 100

## 2019-12-20 NOTE — Progress Notes (Signed)
Reviewed List with Patient  [place] [phone] Quality rating Patient survey rating 2. [place] [phone] Quality rating Patient survey rating 3. [place] [phone] Quality rating Patient survey rating 4. [place] [phone] Quality rating Patient survey rating 5. [place] [phone] Quality rating Patient survey rating 6. [place] [phone] Quality rating Patient survey rating 7. [place] [phone] Quality rating Patient survey rating 8. [employer] [phone] Quality rating Patient survey rating 9. [place] [phone] Quality rating Patient survey rating 11 10. [facility] [phone] Quality rating Patient survey rating 11. [place] [phone] Quality rating Patient survey rating 12. [facility] [place] [phone] Quality rating Patient survey rating 13. [facility] [phone]

## 2019-12-20 NOTE — Progress Notes (Signed)
Physical Therapy Treatment Patient Details Name: [name] MRN: [id number] DOB: October 17, 1941 Today's Date: 12/20/2019    History of Present Illness [name] is a 79 y.o. female with medical history significant of anxiety, osteoarthritis, carcinoid bronchial adenoma right lung, bilateral carpal tunnel syndrome, depression, GERD, urolithiasis, osteoporosis, alcoholic liver disease who was brought to the emergency department after being found on the floor.  She is a heavy wine drinker.  She normally ambulates with a cane, but has been having difficulty with her balance and multiple falls recently.  The nursing staff reports that her gluteal area has some ecchymosis from.  She mentioned that her last time she had wine was on Thursday. She reports having a mild headache. She denies blurred vision, nausea or emesis.    PT Comments    Pt irritated through session, stated "she feels worse than before she entered the hospital and now has new Lt knee pain, pain scale 10/10 with any movement."  Pt stated she saw MD this morning and plans for X-ray.  Pt with declined all verbal cueing to assist with bed mobility and transfer training.  Ambulated with RW to restroom though Lt knee pain limited ability to sit on toilet.  No LOB episodes during gait or standing to wash hands afterwards.  Reduced distance with gait due to knee pain.  EOS pt left in chair with call bell within reach and RN in room.  RN aware of pain and status with pt.  Ice applied to knee with instructions to remove 20 minutes or when numb, which ever comes first as well as a warm blanket for comfort.    Follow Up Recommendations  Home health PT;Supervision - Intermittent;Supervision for mobility/OOB     Equipment Recommendations  Rolling walker with 5" wheels    Recommendations for Other Services       Precautions / Restrictions Precautions Precautions: Fall Restrictions Weight Bearing Restrictions: No    Mobility  Bed  Mobility Overal bed mobility: Needs Assistance Bed Mobility: Supine to Sit     Supine to sit: Min guard     General bed mobility comments: Pt would not follow verbal cueing for hand placement to assist with bed mobility.  Increased time, labored movements  Transfers Overall transfer level: Needs assistance Equipment used: Rolling walker (2 wheeled) Transfers: Sit to/from Stand Sit to Stand: Min assist         General transfer comment: multiple verbal cueing for hand placement to push from bed to assist with standing.  Would not follow cueing today due to irritation.  Ambulation/Gait Ambulation/Gait assistance: Supervision;Min guard Gait Distance (Feet): 25 Feet Assistive device: Rolling walker (2 wheeled) Gait Pattern/deviations: Decreased step length - right;Decreased step length - left;Decreased stride length Gait velocity: near normal   General Gait Details: Limited by Lt knee pain.  Ambulated to restroom, stood at sink with no LOB episodes.  No LOB episodes.   Stairs             Wheelchair Mobility    Modified Rankin (Stroke Patients Only)       Balance                                            Cognition Arousal/Alertness: Awake/alert Behavior During Therapy: Agitated(Pt very irritated through session) Overall Cognitive Status: Within Functional Limits for tasks assessed  General Comments: Pt very irritated through session and limited by new Lt knee pain.  Decreased activity tolerance this session.  RN aware of pain.      Exercises      General Comments        Pertinent Vitals/Pain Pain Assessment: 0-10 Pain Score: 10-Worst pain ever Pain Location: Lt knee pain 10/10 Pain Descriptors / Indicators: ("It's very painful") Pain Intervention(s): Limited activity within patient's tolerance;Monitored during session;Repositioned;Ice applied;Patient requesting pain meds-RN notified     Home Living                      Prior Function            PT Goals (current goals can now be found in the care plan section)      Frequency    Min 3X/week      PT Plan      Co-evaluation              AM-PAC PT "6 Clicks" Mobility   Outcome Measure  Help needed turning from your back to your side while in a flat bed without using bedrails?: None Help needed moving from lying on your back to sitting on the side of a flat bed without using bedrails?: A Little Help needed moving to and from a bed to a chair (including a wheelchair)?: A Little Help needed standing up from a chair using your arms (e.g., wheelchair or bedside chair)?: A Little Help needed to walk in hospital room?: A Little Help needed climbing 3-5 steps with a railing? : A Lot 6 Click Score: 18    End of Session Equipment Utilized During Treatment: Gait belt Activity Tolerance: Patient tolerated treatment well;Patient limited by fatigue;No increased pain;Treatment limited secondary to agitation Patient left: in chair;with call bell/phone within reach;with nursing/sitter in room Nurse Communication: Mobility status PT Visit Diagnosis: Unsteadiness on feet (R26.81);Other abnormalities of gait and mobility (R26.89);Muscle weakness (generalized) (M62.81)     Time: 8099-8338 PT Time Calculation (min) (ACUTE ONLY): 28 min  Charges:  $Therapeutic Activity: 23-37 mins                     [address], LPTA; CBIS [phone]  Aldona Lento 12/20/2019, 11:22 AM

## 2019-12-20 NOTE — TOC Transition Note (Signed)
Transition of Care [facility]) - CM/SW Discharge Note   Patient Details  Name: [name] MRN: [id number] Date of Birth: 1941/11/15  [facility] CM/SW Contact:  Boneta Lucks, RN Phone Number: 12/20/2019, 3:28 PM   Clinical Narrative:   Patient discharging home today with home health.  Romualdo Bolk with [facility] updated.  Blake Divine with [facility] is now delivering rolling walker. Patient waiting for daughter to pick her up.    Final next level of care: [place] Barriers to Discharge: Barriers Resolved   Patient Goals and CMS Choice Patient states their goals for this hospitalization and ongoing recovery are:: to go home. CMS Medicare.gov Compare Post Acute Care list provided to:: Patient Represenative (must comment) Choice offered to / list presented to : Patient  Discharge Placement                Patient to be transferred to facility by: daughter   Patient and family notified of of transfer: 12/20/19  Discharge Plan and Services                DME Arranged: Gilford Rile rolling DME Agency: [facility] Date DME Agency Contacted: 12/19/19 Time DME Agency Contacted: 2103 Representative spoke with at DME Agency: [place]: PT [place]: [place] ([place]) Date [place]: 12/19/19 Time [place]: 1281 Representative spoke with at [place]: Romualdo Bolk   Readmission Risk Interventions No flowsheet data found.

## 2019-12-20 NOTE — Progress Notes (Signed)
DME Choices:  [facility] [address] [place], [place]  [phone]  [facility] [address] [place], [place] [address] [phone]  [facility] [address] [place], [place] [address] [phone]  [place] [facility] [address] [place], [place] [address] [phone]  [facility] [address] [place], [place] Other Locations available [phone]   

## 2019-12-20 NOTE — Progress Notes (Signed)
Discharge instructions reviewed with patient and patients daughter [name].  Both verbalized understanding of instructions.  Patient discharged home with daughter in stable condition.

## 2019-12-21 DIAGNOSIS — S066X0A Traumatic subarachnoid hemorrhage without loss of consciousness, initial encounter: Secondary | ICD-10-CM | POA: Diagnosis not present

## 2019-12-21 DIAGNOSIS — F419 Anxiety disorder, unspecified: Secondary | ICD-10-CM | POA: Diagnosis not present

## 2019-12-21 DIAGNOSIS — L89156 Pressure-induced deep tissue damage of sacral region: Secondary | ICD-10-CM | POA: Diagnosis not present

## 2019-12-21 DIAGNOSIS — E876 Hypokalemia: Secondary | ICD-10-CM | POA: Diagnosis not present

## 2019-12-21 DIAGNOSIS — M810 Age-related osteoporosis without current pathological fracture: Secondary | ICD-10-CM | POA: Diagnosis not present

## 2019-12-21 DIAGNOSIS — E871 Hypo-osmolality and hyponatremia: Secondary | ICD-10-CM | POA: Diagnosis not present

## 2019-12-21 DIAGNOSIS — F329 Major depressive disorder, single episode, unspecified: Secondary | ICD-10-CM | POA: Diagnosis not present

## 2019-12-21 DIAGNOSIS — K7010 Alcoholic hepatitis without ascites: Secondary | ICD-10-CM | POA: Diagnosis not present

## 2019-12-21 DIAGNOSIS — S066X0D Traumatic subarachnoid hemorrhage without loss of consciousness, subsequent encounter: Secondary | ICD-10-CM | POA: Diagnosis not present

## 2019-12-21 DIAGNOSIS — D6959 Other secondary thrombocytopenia: Secondary | ICD-10-CM | POA: Diagnosis not present

## 2019-12-21 DIAGNOSIS — R197 Diarrhea, unspecified: Secondary | ICD-10-CM | POA: Diagnosis not present

## 2019-12-21 DIAGNOSIS — M1712 Unilateral primary osteoarthritis, left knee: Secondary | ICD-10-CM | POA: Diagnosis not present

## 2019-12-21 DIAGNOSIS — D1431 Benign neoplasm of right bronchus and lung: Secondary | ICD-10-CM | POA: Diagnosis not present

## 2019-12-22 LAB — GI PATHOGEN PANEL BY PCR, STOOL

## 2019-12-26 ENCOUNTER — Other Ambulatory Visit: Payer: Self-pay | Admitting: Internal Medicine

## 2019-12-26 ENCOUNTER — Other Ambulatory Visit (HOSPITAL_COMMUNITY): Payer: Self-pay | Admitting: Internal Medicine

## 2019-12-26 DIAGNOSIS — K7010 Alcoholic hepatitis without ascites: Secondary | ICD-10-CM | POA: Diagnosis not present

## 2019-12-26 DIAGNOSIS — I609 Nontraumatic subarachnoid hemorrhage, unspecified: Secondary | ICD-10-CM

## 2019-12-26 DIAGNOSIS — M109 Gout, unspecified: Secondary | ICD-10-CM | POA: Diagnosis not present

## 2019-12-26 DIAGNOSIS — S066X9A Traumatic subarachnoid hemorrhage with loss of consciousness of unspecified duration, initial encounter: Secondary | ICD-10-CM | POA: Diagnosis not present

## 2019-12-28 DIAGNOSIS — K709 Alcoholic liver disease, unspecified: Secondary | ICD-10-CM | POA: Diagnosis not present

## 2019-12-29 ENCOUNTER — Other Ambulatory Visit (HOSPITAL_COMMUNITY): Payer: Self-pay | Admitting: Internal Medicine

## 2019-12-29 ENCOUNTER — Ambulatory Visit (HOSPITAL_COMMUNITY)
Admission: RE | Admit: 2019-12-29 | Discharge: 2019-12-29 | Disposition: A | Discharge status: Home / Self Care | Payer: Medicare Other | Source: Ambulatory Visit | Attending: Internal Medicine | Admitting: Internal Medicine

## 2019-12-29 ENCOUNTER — Other Ambulatory Visit: Payer: Self-pay

## 2019-12-29 DIAGNOSIS — R569 Unspecified convulsions: Secondary | ICD-10-CM | POA: Insufficient documentation

## 2019-12-29 DIAGNOSIS — I609 Nontraumatic subarachnoid hemorrhage, unspecified: Secondary | ICD-10-CM

## 2019-12-30 DIAGNOSIS — S066X9S Traumatic subarachnoid hemorrhage with loss of consciousness of unspecified duration, sequela: Secondary | ICD-10-CM | POA: Diagnosis not present

## 2019-12-30 DIAGNOSIS — G40909 Epilepsy, unspecified, not intractable, without status epilepticus: Secondary | ICD-10-CM | POA: Diagnosis not present

## 2020-01-02 ENCOUNTER — Telehealth: Payer: Self-pay | Admitting: Neurology

## 2020-01-02 NOTE — Telephone Encounter (Signed)
Patients PCA called in to confirm appt date and time

## 2020-01-03 DIAGNOSIS — E538 Deficiency of other specified B group vitamins: Secondary | ICD-10-CM | POA: Diagnosis not present

## 2020-01-10 ENCOUNTER — Other Ambulatory Visit: Payer: Self-pay

## 2020-01-10 ENCOUNTER — Ambulatory Visit (INDEPENDENT_AMBULATORY_CARE_PROVIDER_SITE_OTHER): Payer: Medicare Other | Admitting: Neurology

## 2020-01-10 ENCOUNTER — Encounter: Payer: Self-pay | Admitting: Neurology

## 2020-01-10 VITALS — BP 128/83 | HR 88 | Temp 97.2°F | Ht <= 58 in | Wt 80.5 lb

## 2020-01-10 DIAGNOSIS — F1020 Alcohol dependence, uncomplicated: Secondary | ICD-10-CM | POA: Diagnosis not present

## 2020-01-10 DIAGNOSIS — R2681 Unsteadiness on feet: Secondary | ICD-10-CM | POA: Diagnosis not present

## 2020-01-10 DIAGNOSIS — R569 Unspecified convulsions: Secondary | ICD-10-CM | POA: Insufficient documentation

## 2020-01-10 MED ORDER — LEVETIRACETAM 500 MG PO TABS: 500.0000 mg | ORAL_TABLET | Freq: Two times a day (BID) | ORAL | 4 refills | Status: DC

## 2020-01-10 NOTE — Progress Notes (Signed)
PATIENT: [name] DOB: Nov 02, 1941  Chief Complaint  Patient presents with  . Subarachoid Bleed/Seizures    Follow up from ED visit on 12/18/2019.She is here with her daughter, [name]. She was started on Keppra 500mg  BID. She ran out of the medication and had an episode witnessed by one of her caregivers. She was told she became stiff for 2-3 mintues. The caregiver was able to prevent her from falling. She has since restarted the Keppra and no further events have occurred.   Marland Kitchen PCP    Asencion Noble, MD     HISTORICAL  [name] is a 79 year old female, seen in request by her primary care physician Dr. Asencion Noble for evaluation of seizure, she is accompanied by her daughter [name] at today's visit on January 10, 2020.    I have reviewed and summarized the referring note from the referring physician.  She had past medical history of alcohol dependence, alcoholic liver disease, osteoporosis, she had gradual worsening gait instability, she was found on the floor on day of admission December 18, 2019, was not able to get up, she continued heavy alcohol use until the day of hospital admission, drink a box of wine a week, at baseline, she ambulate with a cane,  There was no seizure activity described during her hospital admission, she was discharged home with Keppra 500 mg twice daily on December 20 2019, 12 tablets, after she ran out of the medications, in the week of December 26, 2019, she suffered 2 episode of sudden onset loss of consciousness, patient was not able to provide details of the event, this was witnessed by her caregiver, who is with her 24/7, she is also receiving home physical therapy  I personally reviewed CT head without contrast on December 29, 2019, no acute abnormality, generalized atrophy, no acute abnormality. On December 19, 2019, stable small volume subarachnoid hemorrhage along the right parietal lobe, significant generalized atrophy, chronic small vessel  disease  Laboratory evaluations BMP showed decreased calcium 7.1, sodium 130, normal uric acid, CMP, albumin was 3.0, increased alkaline phosphate 144, potassium 2.7, creatinine 0.65, normal B12, TSH, CPK was 281   REVIEW OF SYSTEMS: Full 14 system review of systems performed and notable only for as above All other review of systems were negative.  ALLERGIES: Allergies  Allergen Reactions  . Clindamycin Rash  . Doxycycline Hyclate Rash    HOME MEDICATIONS: Current Outpatient Medications  Medication Sig Dispense Refill  . Biotin 2.5 MG TABS Take 1 tablet by mouth daily.     . cyanocobalamin (,VITAMIN B-12,) 1000 MCG/ML injection Inject 1,000 mcg into the skin every 30 (thirty) days.     . cycloSPORINE (RESTASIS) 0.05 % ophthalmic emulsion Place 1 drop into both eyes 2 (two) times daily.     Marland Kitchen denosumab (PROLIA) 60 MG/ML SOSY injection Inject 60 mg into the skin every 6 (six) months.    . Escitalopram Oxalate (LEXAPRO PO) Take by mouth daily.    Marland Kitchen levETIRAcetam (KEPPRA) 500 MG tablet Take 1 tablet (500 mg total) by mouth 2 (two) times daily. 12 tablet 0  . Multiple Vitamin (MULTIVITAMIN) capsule Take 1 capsule by mouth daily.    . potassium citrate (UROCIT-K) 10 MEQ (1080 MG) SR tablet Take 10 mEq by mouth daily.     . predniSONE (DELTASONE) 20 MG tablet Take 2 tablets (40 mg total) by mouth daily with breakfast. 8 tablet 0  . thiamine 100 MG tablet Take 100 mg by mouth  daily.      No current facility-administered medications for this visit.    PAST MEDICAL HISTORY: Past Medical History:  Diagnosis Date  . Anxiety   . Arthritis   . At risk for seizures   . 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 Carpal tunnel syndrome, bilateral   . Depression   . GERD (gastroesophageal reflux disease)   . Kidney stones   . Liver disease   . Osteoporosis   . Subarachnoid bleed ([place])     PAST SURGICAL HISTORY: Past Surgical History:  Procedure Laterality Date  . ABDOMINAL  HYSTERECTOMY     vaginal  . APPENDECTOMY    . CARPAL TUNNEL RELEASE  01/21/2012   Procedure: CARPAL TUNNEL RELEASE;  Surgeon: Wynonia Sours, MD;  Location: [place];  Service: Orthopedics;  Laterality: Right;  . COLON RESECTION  2004   perf bowel after colonoscopy  . CYSTOSCOPY W/ URETERAL STENT PLACEMENT  2010   lt-lazer stone  . CYSTOSCOPY WITH STENT PLACEMENT Bilateral 09/08/2014   Procedure: CYSTOSCOPY WITH STENT PLACEMENT;  Surgeon: Malka So, MD;  Location: [facility];  Service: Urology;  Laterality: Bilateral;  . THORACOTOMY  2007   vatz-rt upper lobe  . TONSILLECTOMY      FAMILY HISTORY: Family History  Problem Relation Age of Onset  . Other Mother        "old age" - died at age 50  . Lung cancer Father     SOCIAL HISTORY: Social History   Socioeconomic History  . Marital status: Widowed    Spouse name: Not on file  . Number of children: 2  . Years of education: college  . Highest education level: Not on file  Occupational History  . Not on file  Tobacco Use  . Smoking status: Former Smoker    Types: Cigarettes    Quit date: 12/01/1996    Years since quitting: 23.1  . Smokeless tobacco: Never Used  Substance and Sexual Activity  . Alcohol use: Yes    Comment: 10 cartons of wine a week.  . Drug use: No  . Sexual activity: Not on file  Other Topics Concern  . Not on file  Social History Narrative   Lives alone but has caregivers come to her home.   Right-handed.   One cup caffeine per day.   Social Determinants of Health   Financial Resource Strain:   . Difficulty of Paying Living Expenses: Not on file  Food Insecurity:   . Worried About Charity fundraiser in the Last Year: Not on file  . Ran Out of Food in the Last Year: Not on file  Transportation Needs:   . Lack of Transportation (Medical): Not on file  . Lack of Transportation (Non-Medical): Not on file  Physical Activity:   . Days of Exercise per Week: Not on file  . Minutes of  Exercise per Session: Not on file  Stress:   . Feeling of Stress : Not on file  Social Connections:   . Frequency of Communication with Friends and Family: Not on file  . Frequency of Social Gatherings with Friends and Family: Not on file  . Attends Religious Services: Not on file  . Active Member of Clubs or Organizations: Not on file  . Attends Archivist Meetings: Not on file  . Marital Status: Not on file  Intimate Partner Violence:   . Fear of Current or Ex-Partner: Not on file  .  Emotionally Abused: Not on file  . Physically Abused: Not on file  . Sexually Abused: Not on file     PHYSICAL EXAM   Vitals:   01/10/20 1528  BP: 128/83  Pulse: 88  Temp: (!) 97.2 F (36.2 C)  Weight: 80 lb 8 oz (36.5 kg)  Height: 4\' 10"  (1.473 m)    Not recorded      Body mass index is 16.82 kg/m.  PHYSICAL EXAMNIATION:  Gen: NAD, conversant, well nourised, well groomed                     Cardiovascular: Regular rate rhythm, no peripheral edema, warm, nontender. Eyes: Conjunctivae clear without exudates or hemorrhage Neck: Supple, no carotid bruits. Pulmonary: Clear to auscultation bilaterally   NEUROLOGICAL EXAM: Fragile, rely on her daughter to provide history,  MENTAL STATUS: Speech:    Speech is normal; fluent and spontaneous with normal comprehension.  Cognition:     Orientation to time, place and person     Normal recent and remote memory     Normal Attention span and concentration     Normal Language, naming, repeating,spontaneous speech     Fund of knowledge   CRANIAL NERVES: CN II: Visual fields are full to confrontation. Pupils are round equal and briskly reactive to light. CN III, IV, VI: extraocular movement are normal. No ptosis. CN V: Facial sensation is intact to light touch CN VII: Face is symmetric with normal eye closure  CN VIII: Hearing is normal to causal conversation. CN IX, X: Phonation is normal. CN XI: Head turning and shoulder shrug  are intact  MOTOR: There is no pronator drift of out-stretched arms. Muscle bulk and tone are normal. Muscle strength is normal.  REFLEXES: Reflexes are 2+ and symmetric at the biceps, triceps, knees, and ankles. Plantar responses are flexor.  SENSORY: Intact to light touch, pinprick and vibratory sensation are intact in fingers and toes.  COORDINATION: There is no trunk or limb dysmetria noted.  GAIT/STANCE: She needs to push up to get up from seated position, wide-based, cautious   DIAGNOSTIC DATA (LABS, IMAGING, TESTING) - I reviewed patient records, labs, notes, testing and imaging myself where available.   ASSESSMENT AND PLAN  MAELANI YARBRO is a 79 y.o. female   Heavy alcohol use,  Subarachnoid hemorrhage on December 18, 2019,  2 episode of sudden loss of consciousness, likely partial seizure   CT scan of the brain showed evidence of generalized atrophy, supratentorium small vessel disease  Complete evaluation with MRI of the brain with without contrast  EEG  Continue Keppra 500 mg twice daily  No driving until episode free for 6 months   Marcial Pacas, M.D. Ph.D.  [facility] [address] [place], [place] [address] Ph: [phone] Fax: [phone]  CC: Asencion Noble, MD

## 2020-01-16 ENCOUNTER — Telehealth: Payer: Self-pay | Admitting: Neurology

## 2020-01-16 NOTE — Telephone Encounter (Signed)
[name], [name] has called asking that the MRI coordinator calls her when scheduling the MRI for pt

## 2020-01-16 NOTE — Telephone Encounter (Signed)
noted 

## 2020-01-16 NOTE — Telephone Encounter (Signed)
[name], [name] has called and is asking that Sharyn Lull, RN calls her with the results to the EEG and MRI(once it has been done)

## 2020-01-17 NOTE — Telephone Encounter (Signed)
Medicare/aarp no auth patient is scheduled at [facility] for 02/02/20 arrival time is 4:30 pm. [name] is aware of time and day. I also gave her their number of [phone] incase she needed to r/s.

## 2020-01-18 ENCOUNTER — Ambulatory Visit (HOSPITAL_COMMUNITY): Payer: Medicare Other

## 2020-01-19 ENCOUNTER — Encounter (HOSPITAL_COMMUNITY)
Admission: RE | Admit: 2020-01-19 | Discharge: 2020-01-19 | Disposition: A | Discharge status: Home / Self Care | Payer: Medicare Other | Source: Ambulatory Visit | Attending: Internal Medicine | Admitting: Internal Medicine

## 2020-01-19 DIAGNOSIS — E441 Mild protein-calorie malnutrition: Secondary | ICD-10-CM | POA: Diagnosis not present

## 2020-01-19 DIAGNOSIS — D696 Thrombocytopenia, unspecified: Secondary | ICD-10-CM | POA: Diagnosis not present

## 2020-01-19 DIAGNOSIS — K7010 Alcoholic hepatitis without ascites: Secondary | ICD-10-CM | POA: Diagnosis not present

## 2020-01-19 DIAGNOSIS — L8990 Pressure ulcer of unspecified site, unspecified stage: Secondary | ICD-10-CM | POA: Diagnosis not present

## 2020-01-19 DIAGNOSIS — E876 Hypokalemia: Secondary | ICD-10-CM | POA: Diagnosis not present

## 2020-01-23 ENCOUNTER — Ambulatory Visit (INDEPENDENT_AMBULATORY_CARE_PROVIDER_SITE_OTHER): Payer: Medicare Other | Admitting: Neurology

## 2020-01-23 ENCOUNTER — Other Ambulatory Visit: Payer: Self-pay

## 2020-01-23 DIAGNOSIS — R569 Unspecified convulsions: Secondary | ICD-10-CM

## 2020-01-23 DIAGNOSIS — R2681 Unsteadiness on feet: Secondary | ICD-10-CM

## 2020-01-23 DIAGNOSIS — F1020 Alcohol dependence, uncomplicated: Secondary | ICD-10-CM

## 2020-01-25 NOTE — Procedures (Signed)
   HISTORY: 79 year old female, with history of subarachnoid hemorrhage, seizure  TECHNIQUE:  This is a routine 16 channel EEG recording with one channel devoted to a limited EKG recording.  It was performed during wakefulness, drowsiness and asleep.  Hyperventilation and photic stimulation were performed as activating procedures.  There are minimum muscle and movement artifact noted.  Upon maximum arousal, posterior dominant waking rhythm consistent of rhythmic alpha range activity, with frequency of 9 Hz. Activities are symmetric over the bilateral posterior derivations and attenuated with eye opening.  Hyperventilation produced mild/moderate buildup with higher amplitude and the slower activities noted.  Photic stimulation did not alter the tracing.  During EEG recording, patient developed drowsiness and entered sleep, sleep EEG demonstrated architecture, there were frontal centrally dominant vertex waves and symmetric sleep spindles noted.  During EEG recording, there was no epileptiform discharge noted.  EKG demonstrate sinus rhythm, with heart rate of 66 bpm  CONCLUSION: This is a  normal awake and asleep EEG.  There is no electrodiagnostic evidence of epileptiform discharge.  Marcial Pacas, M.D. Ph.D.  [facility] [place], [place] [address] Phone: [phone] Fax:      [phone]

## 2020-01-31 ENCOUNTER — Encounter (HOSPITAL_COMMUNITY): Payer: Self-pay

## 2020-01-31 ENCOUNTER — Encounter (HOSPITAL_COMMUNITY)
Admission: RE | Admit: 2020-01-31 | Discharge: 2020-01-31 | Disposition: A | Discharge status: Home / Self Care | Payer: Medicare Other | Source: Ambulatory Visit | Attending: Internal Medicine | Admitting: Internal Medicine

## 2020-01-31 ENCOUNTER — Other Ambulatory Visit: Payer: Self-pay

## 2020-01-31 DIAGNOSIS — M810 Age-related osteoporosis without current pathological fracture: Secondary | ICD-10-CM | POA: Insufficient documentation

## 2020-01-31 MED ORDER — DENOSUMAB 60 MG/ML ~~LOC~~ SOSY
60.0000 mg | PREFILLED_SYRINGE | Freq: Once | SUBCUTANEOUS | Status: AC
  Administered 2020-01-31: 60 mg via SUBCUTANEOUS

## 2020-02-02 ENCOUNTER — Other Ambulatory Visit: Payer: Self-pay

## 2020-02-02 ENCOUNTER — Ambulatory Visit (HOSPITAL_COMMUNITY)
Admission: RE | Admit: 2020-02-02 | Discharge: 2020-02-02 | Disposition: A | Discharge status: Home / Self Care | Payer: Medicare Other | Source: Ambulatory Visit | Attending: Neurology | Admitting: Neurology

## 2020-02-02 DIAGNOSIS — R2681 Unsteadiness on feet: Secondary | ICD-10-CM | POA: Insufficient documentation

## 2020-02-02 DIAGNOSIS — R569 Unspecified convulsions: Secondary | ICD-10-CM | POA: Insufficient documentation

## 2020-02-02 DIAGNOSIS — F1020 Alcohol dependence, uncomplicated: Secondary | ICD-10-CM

## 2020-02-02 MED ORDER — GADOBUTROL 1 MMOL/ML IV SOLN
4.0000 mL | Freq: Once | INTRAVENOUS | Status: AC | PRN
  Administered 2020-02-02: 4 mL via INTRAVENOUS

## 2020-02-03 ENCOUNTER — Telehealth: Payer: Self-pay | Admitting: Neurology

## 2020-02-03 NOTE — Telephone Encounter (Signed)
I was able to speak to the patient's dgt, Hillery Hunter, on DPR. She was provided with her mother's MRI brain results and verbalized understanding. Her EEG was normal. She will continue to take Keppra as prescribed and keep her pending follow up for monitoring.

## 2020-02-03 NOTE — Telephone Encounter (Signed)
IMPRESSION: 1. Moderate atrophy and mild chronic microvascular ischemia. No acute infarct. 2. Chronic blood products in the right parietal lobe related to trauma. No underlying mass lesion is identified.  Please call patient, MRI of the brain showed moderate atrophy, mild small vessel disease, chronic blood product at the right parietal lobe, related to previous trauma

## 2020-02-06 DIAGNOSIS — E538 Deficiency of other specified B group vitamins: Secondary | ICD-10-CM | POA: Diagnosis not present

## 2020-02-29 ENCOUNTER — Encounter (HOSPITAL_COMMUNITY)
Admission: EM | Disposition: A | Discharge status: Home Health | Payer: Self-pay | Source: Home / Self Care | Attending: Internal Medicine

## 2020-02-29 ENCOUNTER — Encounter (HOSPITAL_COMMUNITY): Payer: Self-pay | Admitting: Emergency Medicine

## 2020-02-29 ENCOUNTER — Emergency Department (HOSPITAL_COMMUNITY): Payer: Medicare Other | Admitting: Anesthesiology

## 2020-02-29 ENCOUNTER — Inpatient Hospital Stay (HOSPITAL_COMMUNITY)
Admission: EM | Admit: 2020-02-29 | Discharge: 2020-03-05 | DRG: 854 | Disposition: A | Discharge status: Home Health | Payer: Medicare Other | Attending: Internal Medicine | Admitting: Internal Medicine

## 2020-02-29 ENCOUNTER — Emergency Department (HOSPITAL_COMMUNITY): Payer: Medicare Other

## 2020-02-29 ENCOUNTER — Other Ambulatory Visit: Payer: Self-pay

## 2020-02-29 DIAGNOSIS — Z20822 Contact with and (suspected) exposure to covid-19: Secondary | ICD-10-CM | POA: Diagnosis present

## 2020-02-29 DIAGNOSIS — M810 Age-related osteoporosis without current pathological fracture: Secondary | ICD-10-CM | POA: Diagnosis present

## 2020-02-29 DIAGNOSIS — R1110 Vomiting, unspecified: Secondary | ICD-10-CM | POA: Diagnosis not present

## 2020-02-29 DIAGNOSIS — A419 Sepsis, unspecified organism: Secondary | ICD-10-CM | POA: Diagnosis present

## 2020-02-29 DIAGNOSIS — K219 Gastro-esophageal reflux disease without esophagitis: Secondary | ICD-10-CM | POA: Diagnosis present

## 2020-02-29 DIAGNOSIS — Z87891 Personal history of nicotine dependence: Secondary | ICD-10-CM | POA: Diagnosis not present

## 2020-02-29 DIAGNOSIS — Z90710 Acquired absence of both cervix and uterus: Secondary | ICD-10-CM

## 2020-02-29 DIAGNOSIS — R1030 Lower abdominal pain, unspecified: Secondary | ICD-10-CM | POA: Diagnosis not present

## 2020-02-29 DIAGNOSIS — R569 Unspecified convulsions: Secondary | ICD-10-CM

## 2020-02-29 DIAGNOSIS — N201 Calculus of ureter: Secondary | ICD-10-CM | POA: Diagnosis not present

## 2020-02-29 DIAGNOSIS — A4151 Sepsis due to Escherichia coli [E. coli]: Secondary | ICD-10-CM | POA: Diagnosis present

## 2020-02-29 DIAGNOSIS — D6489 Other specified anemias: Secondary | ICD-10-CM | POA: Diagnosis present

## 2020-02-29 DIAGNOSIS — R5383 Other fatigue: Secondary | ICD-10-CM | POA: Diagnosis not present

## 2020-02-29 DIAGNOSIS — Z87442 Personal history of urinary calculi: Secondary | ICD-10-CM

## 2020-02-29 DIAGNOSIS — D509 Iron deficiency anemia, unspecified: Secondary | ICD-10-CM | POA: Diagnosis present

## 2020-02-29 DIAGNOSIS — Z7952 Long term (current) use of systemic steroids: Secondary | ICD-10-CM | POA: Diagnosis not present

## 2020-02-29 DIAGNOSIS — N200 Calculus of kidney: Secondary | ICD-10-CM | POA: Diagnosis not present

## 2020-02-29 DIAGNOSIS — Z881 Allergy status to other antibiotic agents status: Secondary | ICD-10-CM | POA: Diagnosis not present

## 2020-02-29 DIAGNOSIS — N202 Calculus of kidney with calculus of ureter: Secondary | ICD-10-CM | POA: Diagnosis present

## 2020-02-29 DIAGNOSIS — D6959 Other secondary thrombocytopenia: Secondary | ICD-10-CM | POA: Diagnosis present

## 2020-02-29 DIAGNOSIS — E872 Acidosis: Secondary | ICD-10-CM | POA: Diagnosis present

## 2020-02-29 DIAGNOSIS — R000 Tachycardia, unspecified: Secondary | ICD-10-CM | POA: Diagnosis not present

## 2020-02-29 DIAGNOSIS — F419 Anxiety disorder, unspecified: Secondary | ICD-10-CM | POA: Diagnosis present

## 2020-02-29 DIAGNOSIS — N179 Acute kidney failure, unspecified: Secondary | ICD-10-CM | POA: Diagnosis present

## 2020-02-29 DIAGNOSIS — M1990 Unspecified osteoarthritis, unspecified site: Secondary | ICD-10-CM | POA: Diagnosis present

## 2020-02-29 DIAGNOSIS — E876 Hypokalemia: Secondary | ICD-10-CM | POA: Diagnosis present

## 2020-02-29 DIAGNOSIS — E782 Mixed hyperlipidemia: Secondary | ICD-10-CM | POA: Diagnosis present

## 2020-02-29 DIAGNOSIS — R0602 Shortness of breath: Secondary | ICD-10-CM

## 2020-02-29 DIAGNOSIS — N390 Urinary tract infection, site not specified: Secondary | ICD-10-CM | POA: Diagnosis present

## 2020-02-29 DIAGNOSIS — R197 Diarrhea, unspecified: Secondary | ICD-10-CM | POA: Diagnosis not present

## 2020-02-29 DIAGNOSIS — R6520 Severe sepsis without septic shock: Secondary | ICD-10-CM | POA: Diagnosis not present

## 2020-02-29 DIAGNOSIS — N135 Crossing vessel and stricture of ureter without hydronephrosis: Secondary | ICD-10-CM | POA: Diagnosis not present

## 2020-02-29 DIAGNOSIS — N136 Pyonephrosis: Secondary | ICD-10-CM | POA: Diagnosis present

## 2020-02-29 DIAGNOSIS — F329 Major depressive disorder, single episode, unspecified: Secondary | ICD-10-CM | POA: Diagnosis present

## 2020-02-29 DIAGNOSIS — R4182 Altered mental status, unspecified: Secondary | ICD-10-CM | POA: Diagnosis not present

## 2020-02-29 DIAGNOSIS — E878 Other disorders of electrolyte and fluid balance, not elsewhere classified: Secondary | ICD-10-CM | POA: Diagnosis present

## 2020-02-29 DIAGNOSIS — R112 Nausea with vomiting, unspecified: Secondary | ICD-10-CM | POA: Diagnosis not present

## 2020-02-29 DIAGNOSIS — Z79899 Other long term (current) drug therapy: Secondary | ICD-10-CM

## 2020-02-29 DIAGNOSIS — D649 Anemia, unspecified: Secondary | ICD-10-CM | POA: Diagnosis not present

## 2020-02-29 HISTORY — PX: CYSTOSCOPY W/ URETERAL STENT PLACEMENT: SHX1429

## 2020-02-29 LAB — URINALYSIS, ROUTINE W REFLEX MICROSCOPIC
Bacteria, UA: NONE SEEN
Bilirubin Urine: NEGATIVE
Glucose, UA: NEGATIVE mg/dL
Ketones, ur: NEGATIVE mg/dL
Nitrite: NEGATIVE
Protein, ur: NEGATIVE mg/dL
Specific Gravity, Urine: 1.004 — ABNORMAL LOW (ref 1.005–1.030)
pH: 8 (ref 5.0–8.0)

## 2020-02-29 LAB — CBC WITH DIFFERENTIAL/PLATELET
Abs Immature Granulocytes: 0.03 10*3/uL (ref 0.00–0.07)
Basophils Absolute: 0.1 10*3/uL (ref 0.0–0.1)
Basophils Relative: 0 %
Eosinophils Absolute: 0 10*3/uL (ref 0.0–0.5)
Eosinophils Relative: 0 %
HCT: 35.1 % — ABNORMAL LOW (ref 36.0–46.0)
Hemoglobin: 10.9 g/dL — ABNORMAL LOW (ref 12.0–15.0)
Immature Granulocytes: 0 %
Lymphocytes Relative: 5 %
Lymphs Abs: 0.6 10*3/uL — ABNORMAL LOW (ref 0.7–4.0)
MCH: 28.1 pg (ref 26.0–34.0)
MCHC: 31.1 g/dL (ref 30.0–36.0)
MCV: 90.5 fL (ref 80.0–100.0)
Monocytes Absolute: 0.5 10*3/uL (ref 0.1–1.0)
Monocytes Relative: 4 %
Neutro Abs: 10.9 10*3/uL — ABNORMAL HIGH (ref 1.7–7.7)
Neutrophils Relative %: 91 %
Platelets: 195 10*3/uL (ref 150–400)
RBC: 3.88 MIL/uL (ref 3.87–5.11)
RDW: 15.9 % — ABNORMAL HIGH (ref 11.5–15.5)
WBC: 12.1 10*3/uL — ABNORMAL HIGH (ref 4.0–10.5)
nRBC: 0 % (ref 0.0–0.2)

## 2020-02-29 LAB — COMPREHENSIVE METABOLIC PANEL
ALT: 16 U/L (ref 0–44)
AST: 27 U/L (ref 15–41)
Albumin: 3.8 g/dL (ref 3.5–5.0)
Alkaline Phosphatase: 77 U/L (ref 38–126)
Anion gap: 11 (ref 5–15)
BUN: 16 mg/dL (ref 8–23)
CO2: 23 mmol/L (ref 22–32)
Calcium: 9 mg/dL (ref 8.9–10.3)
Chloride: 103 mmol/L (ref 98–111)
Creatinine, Ser: 0.94 mg/dL (ref 0.44–1.00)
GFR calc Af Amer: >60 mL/min (ref >60)
GFR calc non Af Amer: 58 mL/min — ABNORMAL LOW (ref >60)
Glucose, Bld: 143 mg/dL — ABNORMAL HIGH (ref 70–99)
Potassium: 4.8 mmol/L (ref 3.5–5.1)
Sodium: 137 mmol/L (ref 135–145)
Total Bilirubin: 0.5 mg/dL (ref 0.3–1.2)
Total Protein: 6.6 g/dL (ref 6.5–8.1)

## 2020-02-29 LAB — LACTIC ACID, PLASMA
Lactic Acid, Venous: 1.2 mmol/L (ref 0.5–1.9)
Lactic Acid, Venous: 4.2 mmol/L (ref 0.5–1.9)
Lactic Acid, Venous: 3.3 mmol/L (ref 0.5–1.9)
Lactic Acid, Venous: 5.5 mmol/L (ref 0.5–1.9)

## 2020-02-29 LAB — RESPIRATORY PANEL BY RT PCR (FLU A&B, COVID)
Influenza A by PCR: NEGATIVE
Influenza B by PCR: NEGATIVE
SARS Coronavirus 2 by RT PCR: NEGATIVE

## 2020-02-29 LAB — MRSA PCR SCREENING: MRSA by PCR: NEGATIVE

## 2020-02-29 LAB — LIPASE, BLOOD: Lipase: 32 U/L (ref 11–51)

## 2020-02-29 SURGERY — CYSTOSCOPY, WITH RETROGRADE PYELOGRAM AND URETERAL STENT INSERTION
Anesthesia: General | Site: Ureter | Laterality: Left

## 2020-02-29 MED ORDER — SUCCINYLCHOLINE CHLORIDE 200 MG/10ML IV SOSY
PREFILLED_SYRINGE | INTRAVENOUS | Status: AC
  Filled 2020-02-29: qty 10

## 2020-02-29 MED ORDER — ONDANSETRON HCL 4 MG/2ML IJ SOLN
INTRAMUSCULAR | Status: AC
  Filled 2020-02-29: qty 2

## 2020-02-29 MED ORDER — CHLORHEXIDINE GLUCONATE CLOTH 2 % EX PADS
6.0000 | MEDICATED_PAD | Freq: Every day | CUTANEOUS | Status: DC
  Administered 2020-02-29 – 2020-03-05 (×5): 6 via TOPICAL

## 2020-02-29 MED ORDER — IOHEXOL 300 MG/ML  SOLN
100.0000 mL | Freq: Once | INTRAMUSCULAR | Status: AC | PRN
  Administered 2020-02-29: 75 mL via INTRAVENOUS

## 2020-02-29 MED ORDER — LIDOCAINE HCL (CARDIAC) PF 100 MG/5ML IV SOSY
PREFILLED_SYRINGE | INTRAVENOUS | Status: DC | PRN
  Administered 2020-02-29: 50 mg via INTRATRACHEAL

## 2020-02-29 MED ORDER — CYCLOSPORINE 0.05 % OP EMUL
1.0000 [drp] | Freq: Two times a day (BID) | OPHTHALMIC | Status: DC
  Administered 2020-02-29 – 2020-03-05 (×11): 1 [drp] via OPHTHALMIC
  Filled 2020-02-29 (×11): qty 1

## 2020-02-29 MED ORDER — POTASSIUM CHLORIDE CRYS ER 10 MEQ PO TBCR
10.0000 meq | EXTENDED_RELEASE_TABLET | Freq: Every day | ORAL | Status: DC
  Administered 2020-02-29 – 2020-03-01 (×2): 10 meq via ORAL
  Filled 2020-02-29 (×3): qty 1

## 2020-02-29 MED ORDER — DEXAMETHASONE SODIUM PHOSPHATE 10 MG/ML IJ SOLN
INTRAMUSCULAR | Status: DC | PRN
  Administered 2020-02-29: 10 mg via INTRAVENOUS

## 2020-02-29 MED ORDER — SODIUM CHLORIDE 0.9 % IV SOLN
1.0000 g | INTRAVENOUS | Status: DC
  Administered 2020-02-29: 1 g via INTRAVENOUS
  Filled 2020-02-29: qty 10

## 2020-02-29 MED ORDER — DIATRIZOATE MEGLUMINE 30 % UR SOLN
URETHRAL | Status: DC | PRN
  Administered 2020-02-29: 18 mL via URETHRAL

## 2020-02-29 MED ORDER — SODIUM CHLORIDE 0.9 % IV BOLUS
1000.0000 mL | Freq: Once | INTRAVENOUS | Status: AC
  Administered 2020-02-29: 1000 mL via INTRAVENOUS

## 2020-02-29 MED ORDER — CYANOCOBALAMIN 1000 MCG/ML IJ SOLN
1000.0000 ug | INTRAMUSCULAR | Status: DC
  Administered 2020-02-29: 1000 ug via SUBCUTANEOUS
  Filled 2020-02-29: qty 1

## 2020-02-29 MED ORDER — FENTANYL CITRATE (PF) 100 MCG/2ML IJ SOLN
INTRAMUSCULAR | Status: DC | PRN
  Administered 2020-02-29 (×2): 50 ug via INTRAVENOUS

## 2020-02-29 MED ORDER — STERILE WATER FOR IRRIGATION IR SOLN
Status: DC | PRN
  Administered 2020-02-29: 500 mL

## 2020-02-29 MED ORDER — ONDANSETRON HCL 4 MG/2ML IJ SOLN
INTRAMUSCULAR | Status: DC | PRN
  Administered 2020-02-29: 4 mg via INTRAVENOUS

## 2020-02-29 MED ORDER — CEPHALEXIN 250 MG PO CAPS: 500.0000 mg | ORAL_CAPSULE | Freq: Two times a day (BID) | ORAL | 0 refills | Status: DC

## 2020-02-29 MED ORDER — SUCCINYLCHOLINE CHLORIDE 20 MG/ML IJ SOLN
INTRAMUSCULAR | Status: DC | PRN
  Administered 2020-02-29: 40 mg via INTRAVENOUS

## 2020-02-29 MED ORDER — ONDANSETRON HCL 4 MG/2ML IJ SOLN
4.0000 mg | Freq: Once | INTRAMUSCULAR | Status: AC
  Administered 2020-02-29: 4 mg via INTRAVENOUS
  Filled 2020-02-29: qty 2

## 2020-02-29 MED ORDER — CEPHALEXIN 250 MG PO CAPS
250.0000 mg | ORAL_CAPSULE | Freq: Once | ORAL | Status: DC
  Filled 2020-02-29: qty 1

## 2020-02-29 MED ORDER — ONDANSETRON 4 MG PO TBDP: 4.0000 mg | ORAL_TABLET | Freq: Three times a day (TID) | ORAL | 0 refills | Status: DC | PRN

## 2020-02-29 MED ORDER — SODIUM CHLORIDE 0.9 % IV SOLN
1.0000 g | Freq: Once | INTRAVENOUS | Status: AC
  Administered 2020-02-29: 1 g via INTRAVENOUS
  Filled 2020-02-29: qty 10

## 2020-02-29 MED ORDER — ACETAMINOPHEN 325 MG PO TABS
650.0000 mg | ORAL_TABLET | Freq: Four times a day (QID) | ORAL | Status: DC | PRN
  Administered 2020-03-01: 650 mg via ORAL
  Filled 2020-02-29: qty 2

## 2020-02-29 MED ORDER — ONDANSETRON HCL 4 MG/2ML IJ SOLN: 4.0000 mg | Freq: Once | INTRAMUSCULAR | Status: DC | PRN

## 2020-02-29 MED ORDER — EPHEDRINE 5 MG/ML INJ
INTRAVENOUS | Status: AC
  Filled 2020-02-29: qty 10

## 2020-02-29 MED ORDER — SODIUM CHLORIDE 0.9 % IR SOLN
Status: DC | PRN
  Administered 2020-02-29: 3000 mL

## 2020-02-29 MED ORDER — LEVETIRACETAM 500 MG PO TABS
500.0000 mg | ORAL_TABLET | Freq: Two times a day (BID) | ORAL | Status: DC
  Administered 2020-02-29 – 2020-03-05 (×11): 500 mg via ORAL
  Filled 2020-02-29 (×11): qty 1

## 2020-02-29 MED ORDER — PROPOFOL 10 MG/ML IV BOLUS
INTRAVENOUS | Status: AC
  Filled 2020-02-29: qty 20

## 2020-02-29 MED ORDER — ONDANSETRON HCL 4 MG PO TABS: 4.0000 mg | ORAL_TABLET | Freq: Four times a day (QID) | ORAL | Status: DC | PRN

## 2020-02-29 MED ORDER — SODIUM CHLORIDE 0.9 % IV BOLUS: 1000.0000 mL | INTRAVENOUS | Status: AC

## 2020-02-29 MED ORDER — HYDROMORPHONE HCL 1 MG/ML IJ SOLN
0.5000 mg | Freq: Once | INTRAMUSCULAR | Status: AC
  Administered 2020-02-29: 0.5 mg via INTRAVENOUS
  Filled 2020-02-29: qty 1

## 2020-02-29 MED ORDER — ADULT MULTIVITAMIN W/MINERALS CH
1.0000 | ORAL_TABLET | Freq: Every day | ORAL | Status: DC
  Administered 2020-02-29 – 2020-03-05 (×6): 1 via ORAL
  Filled 2020-02-29 (×6): qty 1

## 2020-02-29 MED ORDER — KETOROLAC TROMETHAMINE 30 MG/ML IJ SOLN
15.0000 mg | Freq: Once | INTRAMUSCULAR | Status: AC
  Administered 2020-02-29: 15 mg via INTRAVENOUS
  Filled 2020-02-29: qty 1

## 2020-02-29 MED ORDER — THIAMINE HCL 100 MG PO TABS
100.0000 mg | ORAL_TABLET | Freq: Every day | ORAL | Status: DC
  Administered 2020-02-29 – 2020-03-05 (×6): 100 mg via ORAL
  Filled 2020-02-29 (×6): qty 1

## 2020-02-29 MED ORDER — SODIUM CHLORIDE 0.9 % IV SOLN: INTRAVENOUS | Status: DC

## 2020-02-29 MED ORDER — LIDOCAINE 2% (20 MG/ML) 5 ML SYRINGE
INTRAMUSCULAR | Status: AC
  Filled 2020-02-29: qty 5

## 2020-02-29 MED ORDER — LACTATED RINGERS IV SOLN
Freq: Once | INTRAVENOUS | Status: AC
  Administered 2020-02-29: 1000 mL via INTRAVENOUS

## 2020-02-29 MED ORDER — PHENYLEPHRINE HCL (PRESSORS) 10 MG/ML IV SOLN
INTRAVENOUS | Status: DC | PRN
  Administered 2020-02-29: 40 ug via INTRAVENOUS
  Administered 2020-02-29 (×3): 80 ug via INTRAVENOUS
  Administered 2020-02-29 (×2): 120 ug via INTRAVENOUS

## 2020-02-29 MED ORDER — ONDANSETRON HCL 4 MG/2ML IJ SOLN: 4.0000 mg | Freq: Four times a day (QID) | INTRAMUSCULAR | Status: DC | PRN

## 2020-02-29 MED ORDER — EPHEDRINE SULFATE 50 MG/ML IJ SOLN
INTRAMUSCULAR | Status: DC | PRN
  Administered 2020-02-29: 5 mg via INTRAVENOUS
  Administered 2020-02-29: 10 mg via INTRAVENOUS

## 2020-02-29 MED ORDER — HYDROCODONE-ACETAMINOPHEN 5-325 MG PO TABS: 1.0000 | ORAL_TABLET | ORAL | 0 refills | Status: DC | PRN

## 2020-02-29 MED ORDER — OXYCODONE-ACETAMINOPHEN 5-325 MG PO TABS
1.0000 | ORAL_TABLET | Freq: Once | ORAL | Status: AC
  Administered 2020-02-29: 1 via ORAL
  Filled 2020-02-29: qty 1

## 2020-02-29 MED ORDER — MORPHINE SULFATE (PF) 2 MG/ML IV SOLN
2.0000 mg | INTRAVENOUS | Status: DC | PRN
  Administered 2020-03-01: 2 mg via INTRAVENOUS
  Filled 2020-02-29: qty 1

## 2020-02-29 MED ORDER — HYDROMORPHONE HCL 1 MG/ML IJ SOLN: 0.2500 mg | INTRAMUSCULAR | Status: DC | PRN

## 2020-02-29 MED ORDER — PHENYLEPHRINE 40 MCG/ML (10ML) SYRINGE FOR IV PUSH (FOR BLOOD PRESSURE SUPPORT)
PREFILLED_SYRINGE | INTRAVENOUS | Status: AC
  Filled 2020-02-29: qty 20

## 2020-02-29 MED ORDER — IBUPROFEN 400 MG PO TABS: 400.0000 mg | ORAL_TABLET | Freq: Four times a day (QID) | ORAL | 0 refills | Status: DC | PRN

## 2020-02-29 MED ORDER — DEXAMETHASONE SODIUM PHOSPHATE 10 MG/ML IJ SOLN
INTRAMUSCULAR | Status: AC
  Filled 2020-02-29: qty 1

## 2020-02-29 MED ORDER — DIATRIZOATE MEGLUMINE 30 % UR SOLN
URETHRAL | Status: AC
  Filled 2020-02-29: qty 100

## 2020-02-29 MED ORDER — FENTANYL CITRATE (PF) 100 MCG/2ML IJ SOLN
50.0000 ug | Freq: Once | INTRAMUSCULAR | Status: AC
  Administered 2020-02-29: 50 ug via INTRAVENOUS
  Filled 2020-02-29: qty 2

## 2020-02-29 MED ORDER — ENOXAPARIN SODIUM 40 MG/0.4ML ~~LOC~~ SOLN
40.0000 mg | SUBCUTANEOUS | Status: DC
  Administered 2020-02-29: 40 mg via SUBCUTANEOUS
  Filled 2020-02-29: qty 0.4

## 2020-02-29 MED ORDER — BIOTIN 2.5 MG PO TABS: 1.0000 | ORAL_TABLET | Freq: Every day | ORAL | Status: DC

## 2020-02-29 MED ORDER — LACTATED RINGERS IV SOLN: INTRAVENOUS | Status: DC | PRN

## 2020-02-29 MED ORDER — FENTANYL CITRATE (PF) 100 MCG/2ML IJ SOLN
INTRAMUSCULAR | Status: AC
  Filled 2020-02-29: qty 2

## 2020-02-29 MED ORDER — ACETAMINOPHEN 650 MG RE SUPP: 650.0000 mg | Freq: Four times a day (QID) | RECTAL | Status: DC | PRN

## 2020-02-29 MED ORDER — PREDNISONE 20 MG PO TABS: 40.0000 mg | ORAL_TABLET | Freq: Every day | ORAL | Status: DC

## 2020-02-29 MED ORDER — PROPOFOL 10 MG/ML IV BOLUS
INTRAVENOUS | Status: DC | PRN
  Administered 2020-02-29: 50 mg via INTRAVENOUS

## 2020-02-29 MED ORDER — CITALOPRAM HYDROBROMIDE 20 MG PO TABS
20.0000 mg | ORAL_TABLET | Freq: Every day | ORAL | Status: DC
  Administered 2020-02-29 – 2020-03-05 (×6): 20 mg via ORAL
  Filled 2020-02-29 (×6): qty 1

## 2020-02-29 SURGICAL SUPPLY — 27 items
BAG DRAIN URO TABLE W/ADPT NS (BAG) ×3 IMPLANT
BAG DRN 8 ADPR NS SKTRN CSTL (BAG) ×2
BAG HAMPER (MISCELLANEOUS) ×3 IMPLANT
CATH INTERMIT  6FR 70CM (CATHETERS) ×3 IMPLANT
CLOTH BEACON ORANGE TIMEOUT ST (SAFETY) ×3 IMPLANT
DECANTER SPIKE VIAL GLASS SM (MISCELLANEOUS) ×3 IMPLANT
DILATOR UROMAX ULTRA (MISCELLANEOUS) ×1 IMPLANT
DRSG MEPILEX SACRM 8.7X9.8 (GAUZE/BANDAGES/DRESSINGS) ×1 IMPLANT
GLOVE BIO SURGEON STRL SZ8 (GLOVE) ×3 IMPLANT
GLOVE BIOGEL PI IND STRL 7.0 (GLOVE) ×4 IMPLANT
GLOVE BIOGEL PI INDICATOR 7.0 (GLOVE) ×3
GLOVE SURG SS PI 6.5 STRL IVOR (GLOVE) ×1 IMPLANT
GOWN STRL REUS W/TWL LRG LVL3 (GOWN DISPOSABLE) ×3 IMPLANT
GOWN STRL REUS W/TWL XL LVL3 (GOWN DISPOSABLE) ×3 IMPLANT
GUIDEWIRE STR DUAL SENSOR (WIRE) ×1 IMPLANT
GUIDEWIRE STR ZIPWIRE 035X150 (MISCELLANEOUS) ×3 IMPLANT
IV NS IRRIG 3000ML ARTHROMATIC (IV SOLUTION) ×3 IMPLANT
KIT TURNOVER CYSTO (KITS) ×3 IMPLANT
MANIFOLD NEPTUNE II (INSTRUMENTS) ×3 IMPLANT
PACK CYSTO (CUSTOM PROCEDURE TRAY) ×3 IMPLANT
PAD ARMBOARD 7.5X6 YLW CONV (MISCELLANEOUS) ×3 IMPLANT
STENT URET 6FRX24 CONTOUR (STENTS) ×2 IMPLANT
SYR 10ML LL (SYRINGE) ×3 IMPLANT
SYR CONTROL 10ML LL (SYRINGE) ×1 IMPLANT
TOWEL OR 17X26 4PK STRL BLUE (TOWEL DISPOSABLE) ×3 IMPLANT
TRAY FOLEY MTR SLVR 16FR STAT (SET/KITS/TRAYS/PACK) ×1 IMPLANT
WATER STERILE IRR 500ML POUR (IV SOLUTION) ×3 IMPLANT

## 2020-02-29 NOTE — Anesthesia Postprocedure Evaluation (Signed)
Anesthesia Post Note  Patient: [name]  Procedure(s) Performed: CYSTOSCOPY WITH BILATERAL RETROGRADE PYELOGRAM;BILATERAL URETERAL STENT PLACEMENT (Bilateral Ureter) LEFT URETERAL DILITATION (Left Ureter)  Patient location during evaluation: PACU Anesthesia Type: General Level of consciousness: awake and alert, oriented and patient cooperative Pain management: pain level controlled Vital Signs Assessment: post-procedure vital signs reviewed and stable Respiratory status: spontaneous breathing and respiratory function stable Cardiovascular status: blood pressure returned to baseline and tachycardic Postop Assessment: no headache, no backache and no apparent nausea or vomiting Anesthetic complications: no     Last Vitals:  Vitals:   02/29/20 1041 02/29/20 1226  BP: 107/63   Pulse:    Resp: 18   Temp: 36.9 C (P) 36.9 C  SpO2: 95%     Last Pain:  Vitals:   02/29/20 1041  TempSrc: Axillary  PainSc: 0-No pain                 Sierah Lacewell

## 2020-02-29 NOTE — Consult Note (Addendum)
Urology Consult  Referring physician: Dr. Wyvonnia Dusky Reason for referral: ureteral calculi, sepsis  Chief Complaint: right flank pain  History of Present Illness: [name] is a 79yo with a hx of nephrolithiasis who presented to the ER this morning with right flank pain. The pain started yesterday and is sharp, intermittent, severe and nonradiating. No other associated symptoms. She does not recall her last stone event. She has had ureteroscopy previously but does not recall when. No LUTS, no hematuria. No exacerbating/alleviaitng events. Ct shows right distal ureteral calculus and left proximal ureteral calculus in an atrophic kidney. HR 130s, SBP in 70-80s. Lactic acid 4.2  Past Medical History:  Diagnosis Date  . Anxiety   . Arthritis   . At risk for seizures   . 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 Carpal tunnel syndrome, bilateral   . Depression   . GERD (gastroesophageal reflux disease)   . Kidney stones   . Liver disease   . Osteoporosis   . Subarachnoid bleed [facility])    Past Surgical History:  Procedure Laterality Date  . ABDOMINAL HYSTERECTOMY     vaginal  . APPENDECTOMY    . CARPAL TUNNEL RELEASE  01/21/2012   Procedure: CARPAL TUNNEL RELEASE;  Surgeon: Wynonia Sours, MD;  Location: [place];  Service: Orthopedics;  Laterality: Right;  . COLON RESECTION  2004   perf bowel after colonoscopy  . CYSTOSCOPY W/ URETERAL STENT PLACEMENT  2010   lt-lazer stone  . CYSTOSCOPY WITH STENT PLACEMENT Bilateral 09/08/2014   Procedure: CYSTOSCOPY WITH STENT PLACEMENT;  Surgeon: Malka So, MD;  Location: [facility];  Service: Urology;  Laterality: Bilateral;  . THORACOTOMY  2007   vatz-rt upper lobe  . TONSILLECTOMY      Medications: I have reviewed the patient's current medications. Allergies:  Allergies  Allergen Reactions  . Clindamycin Rash  . Doxycycline Hyclate Rash    Family History  Problem Relation Age of Onset  . Other Mother        "old  age" - died at age 40  . Lung cancer Father    Social History:  reports that she quit smoking about 23 years ago. Her smoking use included cigarettes. She has never used smokeless tobacco. She reports current alcohol use. She reports that she does not use drugs.  Review of Systems  Constitutional: Positive for fatigue and fever.  Genitourinary: Positive for flank pain.  All other systems reviewed and are negative.   Physical Exam:  Vital signs in last 24 hours: Temp:  [97.7 F (36.5 C)-98.3 F (36.8 C)] 98.3 F (36.8 C) (03/31 0826) Pulse Rate:  [74-136] 108 (03/31 1021) Resp:  [15-25] 19 (03/31 1021) BP: (77-152)/(52-95) 92/56 (03/31 1021) SpO2:  [91 %-100 %] 92 % (03/31 1021) Weight:  [39.9 kg] 39.9 kg (03/31 0208) Physical Exam  Constitutional: She is oriented to person, place, and time. She appears cachectic.  HENT:  Head: Normocephalic and atraumatic.  Eyes: Pupils are equal, round, and reactive to light. EOM are normal.  Neck: No thyromegaly present.  Cardiovascular: Normal rate and regular rhythm.  Respiratory: Effort normal. No respiratory distress.  GI: Soft. She exhibits no distension.  Musculoskeletal:        General: No edema. Normal range of motion.     Cervical back: Normal range of motion.  Neurological: She is alert and oriented to person, place, and time.  Skin: Skin is warm and dry.  Psychiatric: She has a  normal mood and affect. Her behavior is normal. Judgment and thought content normal.    Laboratory Data:  Results for orders placed or performed during the hospital encounter of 02/29/20 (from the past 72 hour(s))  CBC with Differential/Platelet     Status: Abnormal   Collection Time: 02/29/20  2:51 AM  Result Value Ref Range   WBC 12.1 (H) 4.0 - 10.5 K/uL   RBC 3.88 3.87 - 5.11 MIL/uL   Hemoglobin 10.9 (L) 12.0 - 15.0 g/dL   HCT 35.1 (L) 36.0 - 46.0 %   MCV 90.5 80.0 - 100.0 fL   MCH 28.1 26.0 - 34.0 pg   MCHC 31.1 30.0 - 36.0 g/dL   RDW 15.9  (H) 11.5 - 15.5 %   Platelets 195 150 - 400 K/uL   nRBC 0.0 0.0 - 0.2 %   Neutrophils Relative % 91 %   Neutro Abs 10.9 (H) 1.7 - 7.7 K/uL   Lymphocytes Relative 5 %   Lymphs Abs 0.6 (L) 0.7 - 4.0 K/uL   Monocytes Relative 4 %   Monocytes Absolute 0.5 0.1 - 1.0 K/uL   Eosinophils Relative 0 %   Eosinophils Absolute 0.0 0.0 - 0.5 K/uL   Basophils Relative 0 %   Basophils Absolute 0.1 0.0 - 0.1 K/uL   Immature Granulocytes 0 %   Abs Immature Granulocytes 0.03 0.00 - 0.07 K/uL    Comment: Performed at [facility], [address], [place], [place] [address]  Comprehensive metabolic panel     Status: Abnormal   Collection Time: 02/29/20  2:51 AM  Result Value Ref Range   Sodium 137 135 - 145 mmol/L   Potassium 4.8 3.5 - 5.1 mmol/L   Chloride 103 98 - 111 mmol/L   CO2 23 22 - 32 mmol/L   Glucose, Bld 143 (H) 70 - 99 mg/dL    Comment: Glucose reference range applies only to samples taken after fasting for at least 8 hours.   BUN 16 8 - 23 mg/dL   Creatinine, Ser 0.94 0.44 - 1.00 mg/dL   Calcium 9.0 8.9 - 10.3 mg/dL   Total Protein 6.6 6.5 - 8.1 g/dL   Albumin 3.8 3.5 - 5.0 g/dL   AST 27 15 - 41 U/L   ALT 16 0 - 44 U/L   Alkaline Phosphatase 77 38 - 126 U/L   Total Bilirubin 0.5 0.3 - 1.2 mg/dL   GFR calc non Af Amer 58 (L) >60 mL/min   GFR calc Af Amer >60 >60 mL/min   Anion gap 11 5 - 15    Comment: Performed at [facility], [address] [place], [place] [address]  Lipase, blood     Status: None   Collection Time: 02/29/20  2:51 AM  Result Value Ref Range   Lipase 32 11 - 51 U/L    Comment: Performed at [facility], [address], [place], [place] [address]  Lactic acid, plasma     Status: None   Collection Time: 02/29/20  2:51 AM  Result Value Ref Range   Lactic Acid, Venous 1.2 0.5 - 1.9 mmol/L    Comment: Performed at [facility], [address], [place], [place] [address]  Urinalysis, Routine w reflex microscopic     Status: Abnormal   Collection Time: 02/29/20   3:09 AM  Result Value Ref Range   Color, Urine COLORLESS (A) YELLOW   APPearance CLEAR CLEAR   Specific Gravity, Urine 1.004 (L) 1.005 - 1.030   pH  8.0 5.0 - 8.0   Glucose, UA NEGATIVE NEGATIVE mg/dL   Hgb urine dipstick MODERATE (A) NEGATIVE   Bilirubin Urine NEGATIVE NEGATIVE   Ketones, ur NEGATIVE NEGATIVE mg/dL   Protein, ur NEGATIVE NEGATIVE mg/dL   Nitrite NEGATIVE NEGATIVE   Leukocytes,Ua MODERATE (A) NEGATIVE   RBC / HPF 21-50 0 - 5 RBC/hpf   WBC, UA 11-20 0 - 5 WBC/hpf   Bacteria, UA NONE SEEN NONE SEEN    Comment: Performed at [facility], [address], [place], [place] [address]  Lactic acid, plasma     Status: Abnormal   Collection Time: 02/29/20  8:49 AM  Result Value Ref Range   Lactic Acid, Venous 4.2 (HH) 0.5 - 1.9 mmol/L    Comment: CRITICAL RESULT CALLED TO, READ BACK BY AND VERIFIED WITH: KENDRICK,J AT 9:10AM ON 02/29/20 BY [facility] Performed at [facility], [address], [place], [place] [address]   Culture, blood (routine x 2)     Status: None (Preliminary result)   Collection Time: 02/29/20  8:49 AM   Specimen: BLOOD RIGHT HAND  Result Value Ref Range   Specimen Description BLOOD RIGHT HAND    Special Requests      BOTTLES DRAWN AEROBIC AND ANAEROBIC Blood Culture adequate volume Performed at [facility], [address], [place], [place] [address]    Culture PENDING    Report Status PENDING   Culture, blood (routine x 2)     Status: None (Preliminary result)   Collection Time: 02/29/20  8:49 AM   Specimen: BLOOD RIGHT HAND  Result Value Ref Range   Specimen Description RIGHT ANTECUBITAL    Special Requests      BOTTLES DRAWN AEROBIC AND ANAEROBIC Blood Culture adequate volume Performed at [facility], [address], [place], [place] [address]    Culture PENDING    Report Status PENDING   Respiratory Panel by RT PCR (Flu A&B, Covid) - Nasopharyngeal Swab     Status: None   Collection Time: 02/29/20  9:13 AM   Specimen: Nasopharyngeal Swab   Result Value Ref Range   SARS Coronavirus 2 by RT PCR NEGATIVE NEGATIVE    Comment: (NOTE) SARS-CoV-2 target nucleic acids are NOT DETECTED. The SARS-CoV-2 RNA is generally detectable in upper respiratoy specimens during the acute phase of infection. The lowest concentration of SARS-CoV-2 viral copies this assay can detect is 131 copies/mL. A negative result does not preclude SARS-Cov-2 infection and should not be used as the sole basis for treatment or other patient management decisions. A negative result may occur with  improper specimen collection/handling, submission of specimen other than nasopharyngeal swab, presence of viral mutation(s) within the areas targeted by this assay, and inadequate number of viral copies (<131 copies/mL). A negative result must be combined with clinical observations, patient history, and epidemiological information. The expected result is Negative. Fact Sheet for Patients:  PinkCheek.be Fact Sheet for Healthcare Providers:  GravelBags.it This test is not yet ap proved or cleared by the [place] FDA and  has been authorized for detection and/or diagnosis of SARS-CoV-2 by FDA under an Emergency Use Authorization (EUA). This EUA will remain  in effect (meaning this test can be used) for the duration of the COVID-19 declaration under Section 564(b)(1) of the Act, 21 U.S.C. section 360bbb-3(b)(1), unless the authorization is terminated or revoked sooner.    Influenza A by PCR NEGATIVE NEGATIVE   Influenza B by PCR NEGATIVE NEGATIVE    Comment: (NOTE) The Xpert Xpress SARS-CoV-2/FLU/RSV  assay is intended as an aid in  the diagnosis of influenza from Nasopharyngeal swab specimens and  should not be used as a sole basis for treatment. Nasal washings and  aspirates are unacceptable for Xpert Xpress SARS-CoV-2/FLU/RSV  testing. Fact Sheet for  Patients: PinkCheek.be Fact Sheet for Healthcare Providers: GravelBags.it This test is not yet approved or cleared by the [place] FDA and  has been authorized for detection and/or diagnosis of SARS-CoV-2 by  FDA under an Emergency Use Authorization (EUA). This EUA will remain  in effect (meaning this test can be used) for the duration of the  Covid-19 declaration under Section 564(b)(1) of the Act, 21  U.S.C. section 360bbb-3(b)(1), unless the authorization is  terminated or revoked. Performed at [facility], [address], [place], [place] [address]    Recent Results (from the past 240 hour(s))  Culture, blood (routine x 2)     Status: None (Preliminary result)   Collection Time: 02/29/20  8:49 AM   Specimen: BLOOD RIGHT HAND  Result Value Ref Range Status   Specimen Description BLOOD RIGHT HAND  Final   Special Requests   Final    BOTTLES DRAWN AEROBIC AND ANAEROBIC Blood Culture adequate volume Performed at [facility], [address] [place], [place] [address]    Culture PENDING  Incomplete   Report Status PENDING  Incomplete  Culture, blood (routine x 2)     Status: None (Preliminary result)   Collection Time: 02/29/20  8:49 AM   Specimen: BLOOD RIGHT HAND  Result Value Ref Range Status   Specimen Description RIGHT ANTECUBITAL  Final   Special Requests   Final    BOTTLES DRAWN AEROBIC AND ANAEROBIC Blood Culture adequate volume Performed at [facility], [address] [place], [place] [address]    Culture PENDING  Incomplete   Report Status PENDING  Incomplete  Respiratory Panel by RT PCR (Flu A&B, Covid) - Nasopharyngeal Swab     Status: None   Collection Time: 02/29/20  9:13 AM   Specimen: Nasopharyngeal Swab  Result Value Ref Range Status   SARS Coronavirus 2 by RT PCR NEGATIVE NEGATIVE Final    Comment: (NOTE) SARS-CoV-2 target nucleic acids are NOT DETECTED. The SARS-CoV-2 RNA is generally  detectable in upper respiratoy specimens during the acute phase of infection. The lowest concentration of SARS-CoV-2 viral copies this assay can detect is 131 copies/mL. A negative result does not preclude SARS-Cov-2 infection and should not be used as the sole basis for treatment or other patient management decisions. A negative result may occur with  improper specimen collection/handling, submission of specimen other than nasopharyngeal swab, presence of viral mutation(s) within the areas targeted by this assay, and inadequate number of viral copies (<131 copies/mL). A negative result must be combined with clinical observations, patient history, and epidemiological information. The expected result is Negative. Fact Sheet for Patients:  PinkCheek.be Fact Sheet for Healthcare Providers:  GravelBags.it This test is not yet ap proved or cleared by the [place] FDA and  has been authorized for detection and/or diagnosis of SARS-CoV-2 by FDA under an Emergency Use Authorization (EUA). This EUA will remain  in effect (meaning this test can be used) for the duration of the COVID-19 declaration under Section 564(b)(1) of the Act, 21 U.S.C. section 360bbb-3(b)(1), unless the authorization is terminated or revoked sooner.    Influenza A by PCR NEGATIVE NEGATIVE Final   Influenza B by PCR NEGATIVE NEGATIVE Final    Comment: (NOTE) The Xpert  Xpress SARS-CoV-2/FLU/RSV assay is intended as an aid in  the diagnosis of influenza from Nasopharyngeal swab specimens and  should not be used as a sole basis for treatment. Nasal washings and  aspirates are unacceptable for Xpert Xpress SARS-CoV-2/FLU/RSV  testing. Fact Sheet for Patients: PinkCheek.be Fact Sheet for Healthcare Providers: GravelBags.it This test is not yet approved or cleared by the [place] FDA and  has been  authorized for detection and/or diagnosis of SARS-CoV-2 by  FDA under an Emergency Use Authorization (EUA). This EUA will remain  in effect (meaning this test can be used) for the duration of the  Covid-19 declaration under Section 564(b)(1) of the Act, 21  U.S.C. section 360bbb-3(b)(1), unless the authorization is  terminated or revoked. Performed at [facility], [address], [place], [place] [address]    Creatinine: Recent Labs    02/29/20 0251  CREATININE 0.94   Baseline Creatinine: 0.9  Impression/Assessment:  78yo with bilateral ureteral calculi, sepsis  Plan:  The risks/benefits/alterantives to bilateral ureteral stent placement was explained to the patient and she understands and wishes to proceed with surgery. She will be admitted post operatively for CV monitoring and IV antibiotics  Nicolette Bang 02/29/2020, 10:38 AM

## 2020-02-29 NOTE — ED Notes (Signed)
Verified lab collected blood cultures x2 prior to abx administration.

## 2020-02-29 NOTE — Discharge Instructions (Signed)
Take the pain and nausea medication as prescribed.  Follow-up with the urologist.  You may need to have a procedure to remove this kidney stone.  You should return to the ED if you have increased pain that is not controlled by medications, vomiting, inability to urinate, fever or any other concerns.

## 2020-02-29 NOTE — Op Note (Signed)
Preoperative diagnosis: bilateral ureteral calculi, sepsis  Postoperative diagnosis: bilateral ureteral calculi, left UPJ stricture, sepsis  Procedure: 1 cystoscopy 2. bilateralretrograde pyelography 3.  Intraoperative fluoroscopy, under one hour, with interpretation 4. Balloon dilation of UPJ stricture 5.  bilateral 6 x 24 JJ stent placement  Attending: Rosie Fate  Anesthesia: General  Estimated blood loss: None  Drains: bilateral 6 x 24 JJ ureteral stent without tether 16 french foley catheter  Specimens: none  Antibiotics: rocephin  Findings: right distal ureteral calculi, severe right hydronephrosis. Left UPJ obstruction  With UPJ stricture. Moderate left hydronephrosis. No masses/lesions in the bladder. Ureteral orifices in normal anatomic location.  Indications: Patient is a 79 year old female/female with a history of bilateral ureteral stone and concern for sepsis After discussing treatment options, they decided proceed with bilateral stent placement.  Procedure her in detail: The patient was brought to the operating room and a brief timeout was done to ensure correct patient, correct procedure, correct site.  General anesthesia was administered patient was placed in dorsal lithotomy position.  Her genitalia was then prepped and draped in usual sterile fashion.  A rigid 58 French cystoscope was passed in the urethra and the bladder.  Bladder was inspected free masses or lesions.  the ureteral orifices were in the normal orthotopic locations. a 6 french ureteral catheter was then instilled into the left ureteral orifice.  a gentle retrograde was obtained and findings noted above. We then advanced a sensor up to the renal pelvis. We were unable to place a stent due to narrowing at the UPJ. We then advanced a 15 frenchx 4cm balloon dilator past the stricture and then inflated the balloon to 14cm of water. We then deflated the balloon and removed the dilator.  We then placed a 6 x  24 double-j ureteral stent over the sensor wire. We then removed the wire and good coil was noted in the the renal pelvis under fluoroscopy and the bladder under direct vision.  We then turned out attention to the right side.  a gentle retrograde was obtained and findings noted above. We then advanced a zipwire up to the renal pelvis. we then placed a 6 x 24 double-j ureteral stent over the original zip wire.  We then removed the wire and good coil was noted in the the renal pelvis under fluoroscopy and the bladder under direct vision.  the bladder was then drained and this concluded the procedure which was well tolerated by patient.  Complications: None  Condition: Stable, extubated, transferred to PACU  Plan: Patient is to be admitted overnight for cardiovascular monitoring and IV antibiotics. She is to be scheduled for bilateral ureteroscopic stone extraction in 2 weeks.

## 2020-02-29 NOTE — Transfer of Care (Signed)
Immediate Anesthesia Transfer of Care Note  Patient: [name]  Procedure(s) Performed: CYSTOSCOPY WITH BILATERAL RETROGRADE PYELOGRAM;BILATERAL URETERAL STENT PLACEMENT (Bilateral Ureter) LEFT URETERAL DILITATION (Left Ureter)  Patient Location: PACU  Anesthesia Type:General  Level of Consciousness: awake, alert , oriented and patient cooperative  Airway & Oxygen Therapy: Patient Spontanous Breathing and Patient connected to face mask oxygen  Post-op Assessment: Report given to RN and Post -op Vital signs reviewed and stable  Post vital signs: Reviewed and stable  Last Vitals:  Vitals Value Taken Time  BP 115/64 02/29/20 1226  Temp    Pulse 131 02/29/20 1228  Resp 23 02/29/20 1228  SpO2 100 % 02/29/20 1228  Vitals shown include unvalidated device data.  Last Pain:  Vitals:   02/29/20 1041  TempSrc: Axillary  PainSc: 0-No pain      Patients Stated Pain Goal: 8 (38/45/36 4680)  Complications: No apparent anesthesia complications

## 2020-02-29 NOTE — ED Triage Notes (Signed)
Pt C/O lower abdominal pain the N/V that started 2200 last night. Denies urinary symptoms. Denies fevers.

## 2020-02-29 NOTE — ED Notes (Signed)
Pt given water for fluid challenge 

## 2020-02-29 NOTE — Progress Notes (Signed)
CRITICAL VALUE ALERT  Critical Value:  Lactic Acid 3.3  Date & Time Notied:  02/29/2020 @1800   Provider Notified: Dr Roderic Palau  Orders Received/Actions taken: Continue IV fluids as ordered

## 2020-02-29 NOTE — ED Notes (Signed)
Attempted to redress and retape IV. Upon removing tegaderm, pt moved and IV catheter came out. Charge RN aware and at bedside attempting to obtain IV access at this time.   IV infusions paused red cap attached to end of IV tubing while IV access being attempted.

## 2020-02-29 NOTE — H&P (Signed)
History and Physical    [name] DVV:[id number] DOB: [birth year] DOA: 02/29/2020  PCP: Asencion Noble, MD  Patient coming from: Home  I have personally briefly reviewed patient's old medical records in [place]  Chief Complaint: Abdominal pain  HPI: [name] is a 79 y.o. female with medical history significant of kidney stones, presents to the hospital with complaints of Levaquin UTI which began at 10 PM on Monday night.  She had associated nausea with one episode of vomiting.  She denies any fever.  Her symptoms persisted throughout the day on Tuesday.  She presented to the emergency room for her symptoms did not she denies any diarrhea.  No sick contacts.  No shortness of breath or cough.  ED Course: Initially, she was noted to be hemodynamically stable.  Subsequently she became hypotensive and tachycardic.  Lactic acid significantly elevated at 4.  CT imaging shows obstructed right lateral stone.  Renal function appears stable.  Patient has been referred for admission.  Review of Systems: As per HPI otherwise 10 point review of systems negative.    Past Medical History:  Diagnosis Date  . Anxiety   . Arthritis   . At risk for seizures   . 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 Carpal tunnel syndrome, bilateral   . Depression   . GERD (gastroesophageal reflux disease)   . Kidney stones   . Liver disease   . Osteoporosis   . Subarachnoid bleed [facility])     Past Surgical History:  Procedure Laterality Date  . ABDOMINAL HYSTERECTOMY     vaginal  . APPENDECTOMY    . CARPAL TUNNEL RELEASE  01/21/2012   Procedure: CARPAL TUNNEL RELEASE;  Surgeon: Wynonia Sours, MD;  Location: [place];  Service: Orthopedics;  Laterality: Right;  . COLON RESECTION  2004   perf bowel after colonoscopy  . CYSTOSCOPY W/ URETERAL STENT PLACEMENT  2010   lt-lazer stone  . CYSTOSCOPY WITH STENT PLACEMENT Bilateral 09/08/2014   Procedure: CYSTOSCOPY WITH  STENT PLACEMENT;  Surgeon: Malka So, MD;  Location: [facility];  Service: Urology;  Laterality: Bilateral;  . THORACOTOMY  2007   vatz-rt upper lobe  . TONSILLECTOMY      Social History:  reports that she quit smoking about 23 years ago. Her smoking use included cigarettes. She has never used smokeless tobacco. She reports current alcohol use. She reports that she does not use drugs.  Allergies  Allergen Reactions  . Clindamycin Rash  . Doxycycline Hyclate Rash    Family History  Problem Relation Age of Onset  . Other Mother        "old age" - died at age 57  . Lung cancer Father      Prior to Admission medications   Medication Sig Start Date End Date Taking? Authorizing Provider  Biotin 2.5 MG TABS Take 1 tablet by mouth daily.    Yes [provider]  cyanocobalamin (,VITAMIN B-12,) 1000 MCG/ML injection Inject 1,000 mcg into the skin every 30 (thirty) days.  03/22/19  Yes [provider]  cycloSPORINE (RESTASIS) 0.05 % ophthalmic emulsion Place 1 drop into both eyes 2 (two) times daily.    Yes [provider]  denosumab (PROLIA) 60 MG/ML SOSY injection Inject 60 mg into the skin every 6 (six) months.   Yes Asencion Noble, MD  Escitalopram Oxalate (LEXAPRO PO) Take 10 mg by mouth daily.    Yes [provider]  levETIRAcetam (KEPPRA) 500 MG tablet Take 1 tablet (500 mg total) by mouth 2 (two) times daily. 01/10/20  Yes Marcial Pacas, MD  Multiple Vitamin (MULTIVITAMIN) capsule Take 1 capsule by mouth daily.   Yes [provider]  potassium citrate (UROCIT-K) 10 MEQ (1080 MG) SR tablet Take 10 mEq by mouth daily.  03/22/19  Yes [provider]  predniSONE (DELTASONE) 20 MG tablet Take 2 tablets (40 mg total) by mouth daily with breakfast. 12/21/19  Yes Tat, Shanon Brow, MD  thiamine 100 MG tablet Take 100 mg by mouth daily.    Yes [provider]  cephALEXin (KEFLEX) 250 MG capsule Take 2 capsules (500 mg total) by mouth 2 (two) times  daily. 02/29/20   Rancour, Annie Main, MD  HYDROcodone-acetaminophen (NORCO/VICODIN) 5-325 MG tablet Take 1 tablet by mouth every 4 (four) hours as needed. 02/29/20   Rancour, Annie Main, MD  ibuprofen (ADVIL) 400 MG tablet Take 1 tablet (400 mg total) by mouth every 6 (six) hours as needed. 02/29/20   Rancour, Annie Main, MD  ondansetron (ZOFRAN ODT) 4 MG disintegrating tablet Take 1 tablet (4 mg total) by mouth every 8 (eight) hours as needed for nausea or vomiting. 02/29/20   Ezequiel Essex, MD    Physical Exam: Vitals:   02/29/20 1245 02/29/20 1301 02/29/20 1315 02/29/20 1345  BP: 98/67 (!) 100/50 (!) 94/53 (!) 103/59  Pulse: (!) 127 (!) 121 (!) 116 (!) 122  Resp: 18 18 17 20   Temp:    98.3 F (36.8 C)  TempSrc:      SpO2: 95% 96% 95% 99%  Weight:      Height:        Constitutional: NAD, calm, comfortable Eyes: PERRL, lids and conjunctivae normal ENMT: Mucous membranes are moist. Posterior pharynx clear of any exudate or lesions.Normal dentition.  Neck: normal, supple, no masses, no thyromegaly Respiratory: clear to auscultation bilaterally, no wheezing, no crackles. Normal respiratory effort. No accessory muscle use.  Cardiovascular: Regular rate and rhythm, no murmurs / rubs / gallops. No extremity edema. 2+ pedal pulses. No carotid bruits.  Abdomen: no tenderness, no masses palpated. No hepatosplenomegaly. Bowel sounds positive.  Musculoskeletal: no clubbing / cyanosis. No joint deformity upper and lower extremities. Good ROM, no contractures. Normal muscle tone.  Skin: no rashes, lesions, ulcers. No induration Neurologic: CN 2-12 grossly intact. Sensation intact, DTR normal. Strength 5/5 in all 4.  Psychiatric: Normal judgment and insight. Alert and oriented x 3. Normal mood.    Labs on Admission: I have personally reviewed following labs and imaging studies  CBC: Recent Labs  Lab 02/29/20 0251  WBC 12.1*  NEUTROABS 10.9*  HGB 10.9*  HCT 35.1*  MCV 90.5  PLT 536   Basic  Metabolic Panel: Recent Labs  Lab 02/29/20 0251  NA 137  K 4.8  CL 103  CO2 23  GLUCOSE 143*  BUN 16  CREATININE 0.94  CALCIUM 9.0   GFR: Estimated Creatinine Clearance: 31.1 mL/min (by C-G formula based on SCr of 0.94 mg/dL). Liver Function Tests: Recent Labs  Lab 02/29/20 0251  AST 27  ALT 16  ALKPHOS 77  BILITOT 0.5  PROT 6.6  ALBUMIN 3.8   Recent Labs  Lab 02/29/20 0251  LIPASE 32   No results for input(s): AMMONIA in the last 168 hours. Coagulation Profile: No results for input(s): INR, PROTIME in the last 168 hours. Cardiac Enzymes: No results for input(s): CKTOTAL, CKMB, CKMBINDEX, TROPONINI in the last 168 hours. BNP (last 3 results) No  results for input(s): PROBNP in the last 8760 hours. HbA1C: No results for input(s): HGBA1C in the last 72 hours. CBG: No results for input(s): GLUCAP in the last 168 hours. Lipid Profile: No results for input(s): CHOL, HDL, LDLCALC, TRIG, CHOLHDL, LDLDIRECT in the last 72 hours. Thyroid Function Tests: No results for input(s): TSH, T4TOTAL, FREET4, T3FREE, THYROIDAB in the last 72 hours. Anemia Panel: No results for input(s): VITAMINB12, FOLATE, FERRITIN, TIBC, IRON, RETICCTPCT in the last 72 hours. Urine analysis:    Component Value Date/Time   COLORURINE COLORLESS (A) 02/29/2020 0309   APPEARANCEUR CLEAR 02/29/2020 0309   LABSPEC 1.004 (L) 02/29/2020 0309   PHURINE 8.0 02/29/2020 0309   GLUCOSEU NEGATIVE 02/29/2020 0309   HGBUR MODERATE (A) 02/29/2020 0309   BILIRUBINUR NEGATIVE 02/29/2020 0309   KETONESUR NEGATIVE 02/29/2020 0309   PROTEINUR NEGATIVE 02/29/2020 0309   UROBILINOGEN 0.2 09/08/2014 0300   NITRITE NEGATIVE 02/29/2020 0309   LEUKOCYTESUR MODERATE (A) 02/29/2020 0309    Radiological Exams on Admission: CT Head Wo Contrast  Result Date: 02/29/2020 CLINICAL DATA:  79 year old female with history of encephalopathy. Altered mental status after being given pain medication. EXAM: CT HEAD WITHOUT  CONTRAST TECHNIQUE: Contiguous axial images were obtained from the base of the skull through the vertex without intravenous contrast. COMPARISON:  Head CT 12/29/2019. FINDINGS: Brain: Mild cerebral atrophy. Patchy and confluent areas of decreased attenuation are noted throughout the deep and periventricular white matter of the cerebral hemispheres bilaterally, compatible with chronic microvascular ischemic disease. No evidence of acute infarction, hemorrhage, hydrocephalus, extra-axial collection or mass lesion/mass effect. Vascular: Diffuse dural enhancement and high attenuation in the dural venous sinuses related to recent contrast enhanced CT the abdomen and pelvis from earlier today. No hyperdense vessel or unexpected calcification. Skull: Normal. Negative for fracture or focal lesion. Sinuses/Orbits: No acute finding. Other: None. IMPRESSION: 1. No acute intracranial abnormalities. 2. Mild cerebral atrophy with chronic microvascular ischemic changes in the cerebral white matter, as above. Electronically Signed   By: Vinnie Langton M.D.   On: 02/29/2020 08:38   CT ABDOMEN PELVIS W CONTRAST  Result Date: 02/29/2020 CLINICAL DATA:  Lower abdominal pain with nausea and vomiting EXAM: CT ABDOMEN AND PELVIS WITH CONTRAST TECHNIQUE: Multidetector CT imaging of the abdomen and pelvis was performed using the standard protocol following bolus administration of intravenous contrast. CONTRAST:  60mL OMNIPAQUE IOHEXOL 300 MG/ML  SOLN COMPARISON:  07/18/2016 FINDINGS: Lower chest:  Mild atelectasis at the bases. Hepatobiliary: No focal liver abnormality.Limited mural calcification of the posterior wall gallbladder, not significantly progressed. No acute biliary inflammation or ductal dilatation. Pancreas: Unremarkable. Spleen: Unremarkable. Adrenals/Urinary Tract: Negative adrenals. Right hydroureteronephrosis and delayed excretion with avid urothelial enhancement related to a triangular stone the ureter just above the  UVJ, 6 x 7 mm. Associated asymmetric right perinephric stranding is also attributed to the obstruction. There are lower pole calculi measuring up to 3 mm. Atrophic left kidney with diffuse cortical thinning and a dominant cyst that is simple. There are 3 dominant calculi measuring up to 9 mm. Prominent left upper urothelial enhancement but no hydronephrosis or ureteral obstructing stone. Unremarkable bladder. Stomach/Bowel: No obstruction. No evidence of bowel inflammation. Enterocolonic anastomosis in the right lower quadrant. Vascular/Lymphatic: No acute vascular abnormality. Scattered atherosclerotic calcification of the aorta. No mass or adenopathy. Reproductive:Hysterectomy. Other: No ascites or pneumoperitoneum. Small umbilical hernia containing fat and transverse colon. Musculoskeletal: No acute abnormalities. Advanced lumbar spine degeneration with scoliosis. Sclerosis of the lower sacrum attributed to nonacute insufficiency  fracture. IMPRESSION: 1. Obstructing 7 mm stone in the distal right ureter, just above the UVJ. 2. Bilateral nephrolithiasis and atrophic left kidney. 3. Limited mural calcification of the gallbladder. 4. Small umbilical hernia containing transverse colon. 5. Additional incidental findings noted above. Electronically Signed   By: Monte Fantasia M.D.   On: 02/29/2020 05:53   DG Abdomen Acute W/Chest  Result Date: 02/29/2020 CLINICAL DATA:  Lower abdominal pain and nausea and vomiting EXAM: DG ABDOMEN ACUTE W/ 1V CHEST COMPARISON:  12/18/2019, 08/01/2011 FINDINGS: Cardiac shadow is within normal limits. Aortic calcifications are seen. The lungs are well aerated bilaterally. No focal infiltrate or sizable effusion is seen. Old rib deformities are again seen. Scoliosis is again noted stable in appearance. Scattered large and small bowel gas is noted. No free air is seen. Degenerative changes of lumbar spine are again noted. Scattered calcifications are noted in the left mid abdomen  consistent with nonobstructing renal calculi. IMPRESSION: Nonobstructing left renal stones. No acute abnormality noted.  No free air is seen. Electronically Signed   By: Inez Catalina M.D.   On: 02/29/2020 03:47   DG C-Arm 1-60 Min-No Report  Result Date: 02/29/2020 Fluoroscopy was utilized by the requesting physician.  No radiographic interpretation.    EKG: Independently reviewed.  Sinus tachycardia  Assessment/Plan Active Problems:   Right ureteral stone   Seizures (HCC)   Sepsis (HCC)   Acute lower UTI     1. Sepsis, secondary to urinary tract infection from obstructed ureteral stone.  Lactic acid initially elevated at 4.  Blood pressure trended down to the 80s, with improvement with IV fluids.  She is still tachycardic.  Blood cultures were sent.  Continue on ceftriaxone.  Monitor lactic acid.  Clinically, she is not febrile or toxic. 2. 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  Seen by urology, status post balloon dilation of UPJ stricture and bilateral JJ stent placement 3. Urinary tract infection.  Follow-up pending culture.  Continue on ceftriaxone 4. Seizures.  Continue on Keppra. 5. Previous history of alcohol use.  She reports that she has not had any alcohol in several months.  DVT prophylaxis: lovenox  Code Status: full code  Family Communication: offered to call family, but patient says she will inform family herself  Disposition Plan: discharge home once sepsis physiology has stabilized  Consults called: urology, Dr. Alyson Ingles  Admission status: inpatient, stepdown   Kathie Dike MD [facility]   If 7PM-7AM, please contact night-coverage [email or web address]   02/29/2020, 2:28 PM

## 2020-02-29 NOTE — ED Notes (Signed)
Date and time results received: 02/29/20 911   Test: Lactic Acid  Critical Value: 4.2  Name of Provider Notified: Dr. Melina Copa  Orders Received? Or Actions Taken?:No new orders given.

## 2020-02-29 NOTE — ED Notes (Addendum)
Pt taken to CT at this time.  Called pharmacy about missing dose of keflex in ED and Fast Track pyxis. Pharmacy reported would bring dose down.

## 2020-02-29 NOTE — ED Notes (Signed)
Pt back in room from CT 

## 2020-02-29 NOTE — ED Notes (Signed)
Assisted pt to bathroom in room. Pt placed on monitor to obtain updated vital signs, bp noted to be trending down and heart rate trending up. EDP entered room and is aware. EDP reported would place orders.   Pt denies wanting family contacted at this time.

## 2020-02-29 NOTE — ED Notes (Addendum)
Pt undressed and placed in hospital gown, hair bonnet, and yellow socks. Pt green t-shirt, blue capris, 2 black sandals, tan bra, and green jacket placed in patient belonging bag that remains at bedside.   Paper consent signed and at bedside. Surgeon discussed procedure while this RN at bedside.   Pt removed two pearl earrings form ears, black tube of chapstick, keys from capri pocket and placed in red pocket book that remains with pt at bedside.   Pt asked several times if pt wanted family notified. Pt reported "ill call them later I don't want to bother them now." Surgeon also inquired about notifying pt contacts, pt refused at that time as well.   Pt has personal cell phone at bedside and contacted Dr. Ria Comment office regarding surgery.

## 2020-02-29 NOTE — ED Provider Notes (Signed)
[facility] EMERGENCY DEPARTMENT Provider Note   CSN: [id number] Arrival date & time: 02/29/20  0144     History Chief Complaint  Patient presents with  . Abdominal Pain    [name] is a 79 y.o. female.  Patient with history of previous alcohol abuse, she states no alcohol since January 2021, anxiety, acid reflux disease, kidney stones presenting with lower abdominal pain onset tonight around 10 PM.  She reports the pain came on suddenly and is fairly constant and radiates across her entire lower abdomen and lower back.  Associated with nausea and vomiting x1.  She took Tylenol at home without relief.  Denies any pain with urination or any blood in the urine.  Denies any fever.  Denies any chest pain or shortness of breath.  Denies any diarrhea.  States she has had kidney stones in the past but is not certain if this feels similar. She has previously had a hysterectomy as well as appendectomy.  Believes she still has her gallbladder. She has had multiple previous abdominal surgeries. Denies any cardiac history.  Denies any blood thinner use  The history is provided by the patient.  Abdominal Pain Associated symptoms: fatigue, nausea and vomiting   Associated symptoms: no cough, no dysuria, no fever, no hematuria and no shortness of breath        Past Medical History:  Diagnosis Date  . Anxiety   . Arthritis   . At risk for seizures   . 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 Carpal tunnel syndrome, bilateral   . Depression   . GERD (gastroesophageal reflux disease)   . Kidney stones   . Liver disease   . Osteoporosis   . Subarachnoid bleed [facility])     Patient Active Problem List   Diagnosis Date Noted  . Unsteady gait 01/10/2020  . Seizures ([place]) 01/10/2020  . Left knee pain   . AKI (acute kidney injury) ([place]) 12/19/2019  . Physical deconditioning 12/19/2019  . Alcohol dependence ([place]) 12/19/2019  . Thrombocytopenia ([place]) 12/19/2019  .  Subarachnoid hemorrhage ([employer]) 12/19/2019  . SAH (subarachnoid hemorrhage) ([place])   . Transaminasemia   . Subarachnoid bleed ([place]) 12/18/2019  . Hypokalemia 12/18/2019  . Depression   . Hyperbilirubinemia   . Hyponatremia   . Macrocytic anemia   . 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[place])   . Arthritis 05/10/2019  . Diarrhea 10/13/2017  . Kidney stone 10/31/2014  . Left ureteral stone 09/08/2014  . Right ureteral stone 09/08/2014  . Infection of urinary tract 09/08/2014  . Pain in joint, shoulder region 08/09/2012  . Muscle weakness (generalized) 08/09/2012  . Impingement syndrome of left shoulder 08/09/2012    Past Surgical History:  Procedure Laterality Date  . ABDOMINAL HYSTERECTOMY     vaginal  . APPENDECTOMY    . CARPAL TUNNEL RELEASE  01/21/2012   Procedure: CARPAL TUNNEL RELEASE;  Surgeon: Wynonia Sours, MD;  Location: [place];  Service: Orthopedics;  Laterality: Right;  . COLON RESECTION  2004   perf bowel after colonoscopy  . CYSTOSCOPY W/ URETERAL STENT PLACEMENT  2010   lt-lazer stone  . CYSTOSCOPY WITH STENT PLACEMENT Bilateral 09/08/2014   Procedure: CYSTOSCOPY WITH STENT PLACEMENT;  Surgeon: Malka So, MD;  Location: [facility];  Service: Urology;  Laterality: Bilateral;  . THORACOTOMY  2007   vatz-rt upper lobe  . TONSILLECTOMY       OB History   No obstetric history on file.  Family History  Problem Relation Age of Onset  . Other Mother        "old age" - died at age 30  . Lung cancer Father     Social History   Tobacco Use  . Smoking status: Former Smoker    Types: Cigarettes    Quit date: 12/01/1996    Years since quitting: 23.2  . Smokeless tobacco: Never Used  Substance Use Topics  . Alcohol use: Yes    Comment: 10 cartons of wine a week.  . Drug use: No    Home Medications Prior to Admission medications   Medication Sig Start Date End Date Taking? Authorizing Provider  Biotin 2.5 MG TABS Take 1 tablet by mouth daily.      [provider]  cyanocobalamin (,VITAMIN B-12,) 1000 MCG/ML injection Inject 1,000 mcg into the skin every 30 (thirty) days.  03/22/19   [provider]  cycloSPORINE (RESTASIS) 0.05 % ophthalmic emulsion Place 1 drop into both eyes 2 (two) times daily.     [provider]  denosumab (PROLIA) 60 MG/ML SOSY injection Inject 60 mg into the skin every 6 (six) months.    Asencion Noble, MD  Escitalopram Oxalate (LEXAPRO PO) Take 10 mg by mouth daily.     [provider]  levETIRAcetam (KEPPRA) 500 MG tablet Take 1 tablet (500 mg total) by mouth 2 (two) times daily. 01/10/20   Marcial Pacas, MD  Multiple Vitamin (MULTIVITAMIN) capsule Take 1 capsule by mouth daily.    [provider]  potassium citrate (UROCIT-K) 10 MEQ (1080 MG) SR tablet Take 10 mEq by mouth daily.  03/22/19   [provider]  predniSONE (DELTASONE) 20 MG tablet Take 2 tablets (40 mg total) by mouth daily with breakfast. 12/21/19   Tat, Shanon Brow, MD  thiamine 100 MG tablet Take 100 mg by mouth daily.     [provider]    Allergies    Clindamycin and Doxycycline hyclate  Review of Systems   Review of Systems  Constitutional: Positive for activity change, appetite change and fatigue. Negative for fever.  HENT: Negative for congestion and rhinorrhea.   Respiratory: Negative for cough, chest tightness and shortness of breath.   Gastrointestinal: Positive for abdominal pain, nausea and vomiting.  Genitourinary: Negative for dysuria and hematuria.  Musculoskeletal: Positive for back pain. Negative for arthralgias and myalgias.  Neurological: Negative for dizziness, weakness and headaches.   all other systems are negative except as noted in the HPI and PMH.    Physical Exam Updated Vital Signs Ht 4\' 10"  (1.473 m)   Wt 39.9 kg   BMI 18.39 kg/m   Physical Exam Vitals and nursing note reviewed.  Constitutional:      General: She is not in acute distress.    Appearance: She  is well-developed.     Comments: Anxious  HENT:     Head: Normocephalic and atraumatic.     Mouth/Throat:     Pharynx: No oropharyngeal exudate.  Eyes:     Conjunctiva/sclera: Conjunctivae normal.     Pupils: Pupils are equal, round, and reactive to light.  Neck:     Comments: No meningismus. Cardiovascular:     Rate and Rhythm: Normal rate and regular rhythm.     Heart sounds: Normal heart sounds. No murmur.  Pulmonary:     Effort: Pulmonary effort is normal. No respiratory distress.     Breath sounds: Normal breath sounds.  Abdominal:     Palpations: Abdomen  is soft.     Tenderness: There is abdominal tenderness. There is guarding. There is no rebound.     Comments: Diffuse lower abdominal tenderness with voluntary guarding. She jumps with any palpation of her abdomen.  Musculoskeletal:        General: Tenderness present. Normal range of motion.     Cervical back: Normal range of motion and neck supple.     Comments: Paraspinal lumbar tenderness bilaterally  Skin:    General: Skin is warm.  Neurological:     Mental Status: She is alert and oriented to person, place, and time.     Cranial Nerves: No cranial nerve deficit.     Motor: No abnormal muscle tone.     Coordination: Coordination normal.     Comments:  5/5 strength throughout. CN 2-12 intact.Equal grip strength.   Psychiatric:        Behavior: Behavior normal.     ED Results / Procedures / Treatments   Labs (all labs ordered are listed, but only abnormal results are displayed) Labs Reviewed  CBC WITH DIFFERENTIAL/PLATELET - Abnormal; Notable for the following components:      Result Value   WBC 12.1 (*)    Hemoglobin 10.9 (*)    HCT 35.1 (*)    RDW 15.9 (*)    Neutro Abs 10.9 (*)    Lymphs Abs 0.6 (*)    All other components within normal limits  COMPREHENSIVE METABOLIC PANEL - Abnormal; Notable for the following components:   Glucose, Bld 143 (*)    GFR calc non Af Amer 58 (*)    All other components  within normal limits  URINALYSIS, ROUTINE W REFLEX MICROSCOPIC - Abnormal; Notable for the following components:   Color, Urine COLORLESS (*)    Specific Gravity, Urine 1.004 (*)    Hgb urine dipstick MODERATE (*)    Leukocytes,Ua MODERATE (*)    All other components within normal limits  URINE CULTURE  LIPASE, BLOOD  LACTIC ACID, PLASMA    EKG None  Radiology CT ABDOMEN PELVIS W CONTRAST  Result Date: 02/29/2020 CLINICAL DATA:  Lower abdominal pain with nausea and vomiting EXAM: CT ABDOMEN AND PELVIS WITH CONTRAST TECHNIQUE: Multidetector CT imaging of the abdomen and pelvis was performed using the standard protocol following bolus administration of intravenous contrast. CONTRAST:  16mL OMNIPAQUE IOHEXOL 300 MG/ML  SOLN COMPARISON:  07/18/2016 FINDINGS: Lower chest:  Mild atelectasis at the bases. Hepatobiliary: No focal liver abnormality.Limited mural calcification of the posterior wall gallbladder, not significantly progressed. No acute biliary inflammation or ductal dilatation. Pancreas: Unremarkable. Spleen: Unremarkable. Adrenals/Urinary Tract: Negative adrenals. Right hydroureteronephrosis and delayed excretion with avid urothelial enhancement related to a triangular stone the ureter just above the UVJ, 6 x 7 mm. Associated asymmetric right perinephric stranding is also attributed to the obstruction. There are lower pole calculi measuring up to 3 mm. Atrophic left kidney with diffuse cortical thinning and a dominant cyst that is simple. There are 3 dominant calculi measuring up to 9 mm. Prominent left upper urothelial enhancement but no hydronephrosis or ureteral obstructing stone. Unremarkable bladder. Stomach/Bowel: No obstruction. No evidence of bowel inflammation. Enterocolonic anastomosis in the right lower quadrant. Vascular/Lymphatic: No acute vascular abnormality. Scattered atherosclerotic calcification of the aorta. No mass or adenopathy. Reproductive:Hysterectomy. Other: No ascites  or pneumoperitoneum. Small umbilical hernia containing fat and transverse colon. Musculoskeletal: No acute abnormalities. Advanced lumbar spine degeneration with scoliosis. Sclerosis of the lower sacrum attributed to nonacute insufficiency fracture. IMPRESSION: 1. Obstructing 7  mm stone in the distal right ureter, just above the UVJ. 2. Bilateral nephrolithiasis and atrophic left kidney. 3. Limited mural calcification of the gallbladder. 4. Small umbilical hernia containing transverse colon. 5. Additional incidental findings noted above. Electronically Signed   By: Monte Fantasia M.D.   On: 02/29/2020 05:53   DG Abdomen Acute W/Chest  Result Date: 02/29/2020 CLINICAL DATA:  Lower abdominal pain and nausea and vomiting EXAM: DG ABDOMEN ACUTE W/ 1V CHEST COMPARISON:  12/18/2019, 08/01/2011 FINDINGS: Cardiac shadow is within normal limits. Aortic calcifications are seen. The lungs are well aerated bilaterally. No focal infiltrate or sizable effusion is seen. Old rib deformities are again seen. Scoliosis is again noted stable in appearance. Scattered large and small bowel gas is noted. No free air is seen. Degenerative changes of lumbar spine are again noted. Scattered calcifications are noted in the left mid abdomen consistent with nonobstructing renal calculi. IMPRESSION: Nonobstructing left renal stones. No acute abnormality noted.  No free air is seen. Electronically Signed   By: Inez Catalina M.D.   On: 02/29/2020 03:47    Procedures Procedures (including critical care time)  Medications Ordered in ED Medications  sodium chloride 0.9 % bolus 1,000 mL (has no administration in time range)  fentaNYL (SUBLIMAZE) injection 50 mcg (has no administration in time range)  ondansetron (ZOFRAN) injection 4 mg (has no administration in time range)    ED Course  I have reviewed the triage vital signs and the nursing notes.  Pertinent labs & imaging results that were available during my care of the patient  were reviewed by me and considered in my medical decision making (see chart for details).    MDM Rules/Calculators/A&P                     Acute onset of lower abdominal pain with nausea and vomiting.  She is very tender with voluntary guarding.  X-ray will be obtained to rule out free air.  X-rays negative for free air or bowel obstruction.  Urinalysis does show blood without obvious infection.  Culture sent.  Will cover with prophylactic antibiotics. Leukocytosis of 12.  No fever.  Stable anemia.  CT scan obtained to further evaluate for source of her abdominal pain given her difficult exam.  CT shows 7 mm distal obstructing stone at the right UVJ.  On recheck patient's pain is improved.  She is tolerating p.o.  Results discussed with her.  Advised needs follow-up with urology.  We will treat with pain control and nausea control.  Urine culture sent.  No evidence of UTI or sepsis at this time.  Prophylactic antibiotics started.  Return to the ED if worsening pain, fever, vomiting, inability urinate or any other concerns  Addendum: On attempted discharge, patient had some confusion and altered mental status.  States that while you are in my house" and needed redirection back into her room.  She did remember that she was in the ER and that she was here for a kidney stone. She refuses to get permission to talk to her daughter.  She apparently should have a caregiver, picking her up around 8 AM.  Suspect her mental status changes due to the pain medication she received as well as the unfamiliar surroundings. Discharge will be held while we wait for her ride to arrive.  CT scan of her head will be obtained to further evaluate her altered mental status.  Dr. Melina Copa will assume care at shift change with CT head  pending.  Patient will need someone with her before she can be safely discharged.  Final Clinical Impression(s) / ED Diagnoses Final diagnoses:  Ureteral stone    Rx / DC  Orders ED Discharge Orders    None       Debhora Titus, Annie Main, MD 02/29/20 [phone]

## 2020-02-29 NOTE — Anesthesia Preprocedure Evaluation (Signed)
Anesthesia Evaluation  Patient identified by MRN, date of birth, ID band Patient awake    Reviewed: Allergy & Precautions, NPO status , Patient's Chart, lab work & pertinent test results  History of Anesthesia Complications Negative for: history of anesthetic complications  Airway Mallampati: II  TM Distance: >3 FB Neck ROM: Full    Dental  (+) Dental Advisory Given, Caps   Pulmonary former smoker,  Right Carcinoid bronchial adenoma, thoracotomy   Pulmonary exam normal breath sounds clear to auscultation       Cardiovascular Exercise Tolerance: Poor  Rhythm:Regular Rate:Tachycardia  29-Feb-2020 09:48:13 [facility] ROUTINE RECORD Sinus tachycardia Consider right atrial enlargement Right axis deviation Anteroseptal infarct, old Confirmed by Aletta Edouard [phone]) on 02/29/2020 9:50:44 AM   Neuro/Psych Seizures - (last seizure- 12/2019),  PSYCHIATRIC DISORDERS Anxiety Depression  Neuromuscular disease CVA    GI/Hepatic GERD  Medicated,(+)     substance abuse  alcohol use, Alcoholic liver disease Colon ressection   Endo/Other  negative endocrine ROS  Renal/GU Renal disease (renal stones, sepsis)     Musculoskeletal  (+) Arthritis , Osteoarthritis,    Abdominal   Peds  Hematology  (+) anemia ,   Anesthesia Other Findings   Reproductive/Obstetrics                            Anesthesia Physical Anesthesia Plan  ASA: IV and emergent  Anesthesia Plan: General   Post-op Pain Management:    Induction: Intravenous  PONV Risk Score and Plan: 4 or greater and Ondansetron and Treatment may vary due to age or medical condition  Airway Management Planned: Oral ETT  Additional Equipment:   Intra-op Plan:   Post-operative Plan: Extubation in OR  Informed Consent: I have reviewed the patients History and Physical, chart, labs and discussed the procedure including the  risks, benefits and alternatives for the proposed anesthesia with the patient or authorized representative who has indicated his/her understanding and acceptance.     Dental advisory given  Plan Discussed with: CRNA  Anesthesia Plan Comments:         Anesthesia Quick Evaluation

## 2020-02-29 NOTE — Progress Notes (Signed)
Dr Roderic Palau made aware of patient's blood pressures since being admitted to unit at 1520. Patient remained alert and oriented x4 with no complaints of pain, shortness of breath, chest pain, dizziness, nausea or vomiting.

## 2020-02-29 NOTE — ED Provider Notes (Signed)
Signout from Dr. Kingsley Callander.  79 year old female here with abdominal pain nausea vomiting.  Work-up showing 7 mm kidney stone.  Has received some IV and oral pain medicine.  Now more confused.  Pending head CT and caregiver arrival to make sure she is safe for discharge. Physical Exam  BP (!) 137/92 (BP Location: Right Arm)   Pulse 76   Temp 97.7 F (36.5 C) (Oral)   Resp 18   Ht 4\' 10"  (1.473 m)   Wt 39.9 kg   SpO2 99%   BMI 18.39 kg/m   Physical Exam  ED Course/Procedures   Clinical Course as of Feb 28 1005  Wed Feb 29, 2020  0942 Discussed with [facility] hospitalist for admission.  I was informed by the nurse that the patient is getting prepped to go to the operating room.  Pressure still in the 80s but awake alert standing at the side of the bed taking off her jewelry without symptoms.   [MB]    Clinical Course User Index [MB] Hayden Rasmussen, MD    .Critical Care Performed by: Hayden Rasmussen, MD Authorized by: Hayden Rasmussen, MD   Critical care provider statement:    Critical care time (minutes):  45   Critical care time was exclusive of:  Separately billable procedures and treating other patients   Critical care was necessary to treat or prevent imminent or life-threatening deterioration of the following conditions:  Sepsis   Critical care was time spent personally by me on the following activities:  Discussions with consultants, evaluation of patient's response to treatment, examination of patient, ordering and performing treatments and interventions, ordering and review of laboratory studies, ordering and review of radiographic studies, pulse oximetry, re-evaluation of patient's condition, obtaining history from patient or surrogate, review of old charts and development of treatment plan with patient or surrogate   I assumed direction of critical care for this patient from another provider in my specialty: no      MDM   8:25 AM.  Informed by the nurse that the  patient is now tachycardic and blood pressures dropped.  I found her awake and alert, she states she does not feel well and she thinks it is due to the medications she is received.  Currently tacky 130s blood pressure in the 70s.  Have ordered blood cultures lactate fluids and IV ceftriaxone.  Temperature repeated and remains afebrile.  9:20 AM.  Lactate elevated at 4.  Discussed with Dr. Wendy Poet urology who would have somebody evaluate the patient.      Hayden Rasmussen, MD 02/29/20 [phone]

## 2020-03-01 DIAGNOSIS — A4151 Sepsis due to Escherichia coli [E. coli]: Principal | ICD-10-CM

## 2020-03-01 LAB — COMPREHENSIVE METABOLIC PANEL
ALT: 12 U/L (ref 0–44)
AST: 19 U/L (ref 15–41)
Albumin: 2.3 g/dL — ABNORMAL LOW (ref 3.5–5.0)
Alkaline Phosphatase: 36 U/L — ABNORMAL LOW (ref 38–126)
Anion gap: 9 (ref 5–15)
BUN: 26 mg/dL — ABNORMAL HIGH (ref 8–23)
CO2: 20 mmol/L — ABNORMAL LOW (ref 22–32)
Calcium: 6.8 mg/dL — ABNORMAL LOW (ref 8.9–10.3)
Chloride: 109 mmol/L (ref 98–111)
Creatinine, Ser: 1.42 mg/dL — ABNORMAL HIGH (ref 0.44–1.00)
GFR calc Af Amer: 41 mL/min — ABNORMAL LOW (ref >60)
GFR calc non Af Amer: 35 mL/min — ABNORMAL LOW (ref >60)
Glucose, Bld: 67 mg/dL — ABNORMAL LOW (ref 70–99)
Potassium: 4.3 mmol/L (ref 3.5–5.1)
Sodium: 138 mmol/L (ref 135–145)
Total Bilirubin: 0.3 mg/dL (ref 0.3–1.2)
Total Protein: 4.6 g/dL — ABNORMAL LOW (ref 6.5–8.1)

## 2020-03-01 LAB — CBC
HCT: 32 % — ABNORMAL LOW (ref 36.0–46.0)
Hemoglobin: 9.9 g/dL — ABNORMAL LOW (ref 12.0–15.0)
MCH: 28.7 pg (ref 26.0–34.0)
MCHC: 30.9 g/dL (ref 30.0–36.0)
MCV: 92.8 fL (ref 80.0–100.0)
Platelets: 77 10*3/uL — ABNORMAL LOW (ref 150–400)
RBC: 3.45 MIL/uL — ABNORMAL LOW (ref 3.87–5.11)
RDW: 16.8 % — ABNORMAL HIGH (ref 11.5–15.5)
WBC: 23.5 10*3/uL — ABNORMAL HIGH (ref 4.0–10.5)
nRBC: 0 % (ref 0.0–0.2)

## 2020-03-01 LAB — BLOOD CULTURE ID PANEL (REFLEXED)

## 2020-03-01 LAB — GLUCOSE, CAPILLARY: Glucose-Capillary: 84 mg/dL (ref 70–99)

## 2020-03-01 LAB — LACTIC ACID, PLASMA: Lactic Acid, Venous: 2.3 mmol/L (ref 0.5–1.9)

## 2020-03-01 MED ORDER — LORAZEPAM 2 MG/ML IJ SOLN
1.0000 mg | INTRAMUSCULAR | Status: AC | PRN
  Administered 2020-03-01: 1 mg via INTRAVENOUS
  Filled 2020-03-01: qty 1

## 2020-03-01 MED ORDER — ENOXAPARIN SODIUM 30 MG/0.3ML ~~LOC~~ SOLN
30.0000 mg | SUBCUTANEOUS | Status: DC
  Administered 2020-03-01: 30 mg via SUBCUTANEOUS
  Filled 2020-03-01: qty 0.3

## 2020-03-01 MED ORDER — LORAZEPAM 2 MG/ML IJ SOLN
0.5000 mg | Freq: Once | INTRAMUSCULAR | Status: AC
  Administered 2020-03-01: 0.5 mg via INTRAVENOUS
  Filled 2020-03-01: qty 1

## 2020-03-01 MED ORDER — SODIUM CHLORIDE 0.9 % IV SOLN
500.0000 mg | Freq: Two times a day (BID) | INTRAVENOUS | Status: DC
  Administered 2020-03-01 (×2): 500 mg via INTRAVENOUS
  Filled 2020-03-01 (×9): qty 0.5

## 2020-03-01 MED ORDER — LORAZEPAM 1 MG PO TABS: 1.0000 mg | ORAL_TABLET | ORAL | Status: AC | PRN

## 2020-03-01 MED ORDER — LORAZEPAM 2 MG/ML IJ SOLN: 0.0000 mg | Freq: Three times a day (TID) | INTRAMUSCULAR | Status: DC

## 2020-03-01 MED ORDER — LORAZEPAM 2 MG/ML IJ SOLN
0.0000 mg | INTRAMUSCULAR | Status: AC
  Administered 2020-03-02 (×2): 1 mg via INTRAVENOUS
  Filled 2020-03-01 (×2): qty 1

## 2020-03-01 NOTE — Progress Notes (Signed)
PROGRESS NOTE    CAMELLA SEIM  OYD:[id number] DOB: 03/30/1941 DOA: 02/29/2020 PCP: Asencion Noble, MD    Brief Narrative:  79 year old female with a history of prior kidney stones, admitted to the hospital with abdominal pain.  She was found to have an obstructed renal stone and evidence of UTI/sepsis.  Blood cultures positive for E. coli.  She was seen by urology and underwent bilateral stent placement.   Assessment & Plan:   Active Problems:   Right ureteral stone   Seizures (HCC)   Sepsis (HCC)   Acute lower UTI   1. E. coli sepsis, secondary to urinary tract infection from obstructed ureteral stone.    Blood cultures positive for E. coli.  Continue on meropenem.  Blood pressures have improved, but she still tachycardic 2. 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  Seen by urology, status post balloon dilation of UPJ stricture and bilateral JJ stent placement 3. Acute kidney injury.  Likely related to sepsis.  Continue IV hydration and monitor urine output. 4. Urinary tract infection.    Urine culture positive for E. coli.  Continue on meropenem 5. Seizures.  Continue on Keppra. 6. Previous history of alcohol use.  She reports that she has not had any alcohol in several months.  Currently, she is still tachycardic and having periods of confusion, will place on CIWA protocol   DVT prophylaxis: Lovenox Code Status: Full code Family Communication: Offered to call family, patient refused Disposition Plan: Patient is from home.  Will need physical therapy evaluation to determine appropriate disposition.  Discharge once hemodynamic status has stabilized   Consultants:   Urology  Procedures:   3/31: balloon dilation of UPJ stricture and bilateral JJ stent placement  Antimicrobials:   Ceftriaxone 3/31 > 4/1  Meropenem 4/1 >   Subjective: Patient is somnolent. Says she is "not a morning person". Denies any nausea or vomiting. Reports improvement of abdominal  pain   Objective: Vitals:   03/01/20 1811 03/01/20 1817 03/01/20 1839 03/01/20 1842  BP: (!) 128/98     Pulse:  (!) 102  (!) 119  Resp:   20   Temp:      TempSrc:      SpO2:      Weight:      Height:        Intake/Output Summary (Last 24 hours) at 03/01/2020 1939 Last data filed at 03/01/2020 1800 Gross per 24 hour  Intake 3448.9 ml  Output 950 ml  Net 2498.9 ml   Filed Weights   02/29/20 0208 02/29/20 1517  Weight: 39.9 kg 47 kg    Examination:  General exam: Appears calm and comfortable  Respiratory system: Clear to auscultation. Respiratory effort normal. Cardiovascular system: S1 & S2 heard, tachycardic. No JVD, murmurs, rubs, gallops or clicks. No pedal edema. Gastrointestinal system: Abdomen is nondistended, soft and nontender. No organomegaly or masses felt. Normal bowel sounds heard. Central nervous system:  No focal neurological deficits. Extremities: Symmetric 5 x 5 power. Skin: No rashes, lesions or ulcers Psychiatry: somnolent    Data Reviewed: I have personally reviewed following labs and imaging studies  CBC: Recent Labs  Lab 02/29/20 0251 03/01/20 0713  WBC 12.1* 23.5*  NEUTROABS 10.9*  --   HGB 10.9* 9.9*  HCT 35.1* 32.0*  MCV 90.5 92.8  PLT 195 77*   Basic Metabolic Panel: Recent Labs  Lab 02/29/20 0251 03/01/20 0421  NA 137 138  K 4.8 4.3  CL 103 109  CO2 23  20*  GLUCOSE 143* 67*  BUN 16 26*  CREATININE 0.94 1.42*  CALCIUM 9.0 6.8*   GFR: Estimated Creatinine Clearance: 22.3 mL/min (A) (by C-G formula based on SCr of 1.42 mg/dL (H)). Liver Function Tests: Recent Labs  Lab 02/29/20 0251 03/01/20 0421  AST 27 19  ALT 16 12  ALKPHOS 77 36*  BILITOT 0.5 0.3  PROT 6.6 4.6*  ALBUMIN 3.8 2.3*   Recent Labs  Lab 02/29/20 0251  LIPASE 32   No results for input(s): AMMONIA in the last 168 hours. Coagulation Profile: No results for input(s): INR, PROTIME in the last 168 hours. Cardiac Enzymes: No results for input(s):  CKTOTAL, CKMB, CKMBINDEX, TROPONINI in the last 168 hours. BNP (last 3 results) No results for input(s): PROBNP in the last 8760 hours. HbA1C: No results for input(s): HGBA1C in the last 72 hours. CBG: Recent Labs  Lab 03/01/20 0657  GLUCAP 84   Lipid Profile: No results for input(s): CHOL, HDL, LDLCALC, TRIG, CHOLHDL, LDLDIRECT in the last 72 hours. Thyroid Function Tests: No results for input(s): TSH, T4TOTAL, FREET4, T3FREE, THYROIDAB in the last 72 hours. Anemia Panel: No results for input(s): VITAMINB12, FOLATE, FERRITIN, TIBC, IRON, RETICCTPCT in the last 72 hours. Sepsis Labs: Recent Labs  Lab 02/29/20 0849 02/29/20 1622 02/29/20 1851 03/01/20 1541  LATICACIDVEN 4.2* 3.3* 5.5* 2.3*    Recent Results (from the past 240 hour(s))  Urine Culture     Status: Abnormal (Preliminary result)   Collection Time: 02/29/20  2:20 AM   Specimen: Urine, Clean Catch  Result Value Ref Range Status   Specimen Description   Final    URINE, CLEAN CATCH Performed at [facility], [address], [place], [place] [address]    Special Requests   Final    NONE Performed at [facility], [address], [place], [place] [address]    Culture (A)  Final    >=100,000 COLONIES/mL ESCHERICHIA COLI SUSCEPTIBILITIES TO FOLLOW Performed at [facility], [place] [address], [place], [place] [address]    Report Status PENDING  Incomplete  Culture, blood (routine x 2)     Status: None (Preliminary result)   Collection Time: 02/29/20  8:49 AM   Specimen: BLOOD RIGHT HAND  Result Value Ref Range Status   Specimen Description   Final    BLOOD RIGHT HAND Performed at [facility], [address] [place], [place] [address]    Special Requests   Final    BOTTLES DRAWN AEROBIC AND ANAEROBIC Blood Culture adequate volume Performed at [facility], [address] [place], [place] [address]    Culture  Setup Time   Final    AEROBIC BOTTLE ONLY GRAM NEGATIVE RODS Gram Stain Report Called to,Read  Back By and Verified With: A AMBURN,RN@0245  03/01/2020 [facility] Performed at [facility], [address] [place], [place] [address]    Culture GRAM NEGATIVE RODS  Final   Report Status PENDING  Incomplete  Culture, blood (routine x 2)     Status: None (Preliminary result)   Collection Time: 02/29/20  8:49 AM   Specimen: Right Antecubital; Blood  Result Value Ref Range Status   Specimen Description   Final    RIGHT ANTECUBITAL Performed at [facility], [address] [place], [place] [address]    Special Requests   Final    BOTTLES DRAWN AEROBIC AND ANAEROBIC Blood Culture adequate volume Performed at [facility], [address] [place], [place] [address]    Culture  Setup Time   Final    AEROBIC BOTTLE ONLY GRAM NEGATIVE RODS Gram Stain Report Called to,Read Back By and Verified With: A AMBURN,RN @0355  03/01/20 [facility] CRITICAL RESULT CALLED TO, READ BACK BY AND VERIFIED WITH: PHARMD G COFFEE [id number] AT 830 AM BY CM Performed at [facility], [place] [address], [place], [place] [address]    Culture GRAM NEGATIVE RODS  Final   Report Status PENDING  Incomplete  Blood Culture ID Panel (Reflexed)     Status: Abnormal   Collection Time: 02/29/20  8:49 AM  Result Value Ref Range Status   Enterococcus species NOT DETECTED NOT DETECTED Final   Listeria monocytogenes NOT DETECTED NOT DETECTED Final   Staphylococcus species NOT DETECTED NOT DETECTED Final   Staphylococcus aureus (BCID) NOT DETECTED NOT DETECTED Final   Streptococcus species NOT DETECTED NOT DETECTED Final   Streptococcus agalactiae NOT DETECTED NOT DETECTED Final   Streptococcus pneumoniae NOT DETECTED NOT DETECTED Final   Streptococcus pyogenes NOT DETECTED NOT DETECTED Final   Acinetobacter baumannii NOT DETECTED NOT DETECTED Final   Enterobacteriaceae species DETECTED (A) NOT DETECTED Final    Comment: Enterobacteriaceae represent a large family of gram-negative bacteria, not a single organism. CRITICAL RESULT  CALLED TO, READ BACK BY AND VERIFIED WITH: PHARMD G COFFEE [id number] AT 470 AM BY CM    Enterobacter cloacae complex NOT DETECTED NOT DETECTED Final   Escherichia coli DETECTED (A) NOT DETECTED Final    Comment: CRITICAL RESULT CALLED TO, READ BACK BY AND VERIFIED WITH: PHARMD G COFFEE [id number] AT 962 AM BY CM    Klebsiella oxytoca NOT DETECTED NOT DETECTED Final   Klebsiella pneumoniae NOT DETECTED NOT DETECTED Final   Proteus species NOT DETECTED NOT DETECTED Final   Serratia marcescens NOT DETECTED NOT DETECTED Final   Carbapenem resistance NOT DETECTED NOT DETECTED Final   Haemophilus influenzae NOT DETECTED NOT DETECTED Final   Neisseria meningitidis NOT DETECTED NOT DETECTED Final   Pseudomonas aeruginosa NOT DETECTED NOT DETECTED Final   Candida albicans NOT DETECTED NOT DETECTED Final   Candida glabrata NOT DETECTED NOT DETECTED Final   Candida krusei NOT DETECTED NOT DETECTED Final   Candida parapsilosis NOT DETECTED NOT DETECTED Final   Candida tropicalis NOT DETECTED NOT DETECTED Final    Comment: Performed at [facility], [place] [address] [place], [place] [address]  Respiratory Panel by RT PCR (Flu A&B, Covid) - Nasopharyngeal Swab     Status: None   Collection Time: 02/29/20  9:13 AM   Specimen: Nasopharyngeal Swab  Result Value Ref Range Status   SARS Coronavirus 2 by RT PCR NEGATIVE NEGATIVE Final    Comment: (NOTE) SARS-CoV-2 target nucleic acids are NOT DETECTED. The SARS-CoV-2 RNA is generally detectable in upper respiratoy specimens during the acute phase of infection. The lowest concentration of SARS-CoV-2 viral copies this assay can detect is 131 copies/mL. A negative result does not preclude SARS-Cov-2 infection and should not be used as the sole basis for treatment or other patient management decisions. A negative result may occur with  improper specimen collection/handling, submission of specimen other than nasopharyngeal swab, presence of viral  mutation(s) within the areas targeted by this assay, and inadequate number of viral copies (<131 copies/mL). A negative result must be combined with clinical observations, patient history, and epidemiological information. The expected result is Negative. Fact Sheet for Patients:  PinkCheek.be Fact Sheet for Healthcare Providers:  GravelBags.it This test is not yet ap proved or cleared  by the [place] and  has been authorized for detection and/or diagnosis of SARS-CoV-2 by FDA under an Emergency Use Authorization (EUA). This EUA will remain  in effect (meaning this test can be used) for the duration of the COVID-19 declaration under Section 564(b)(1) of the Act, 21 U.S.C. section 360bbb-3(b)(1), unless the authorization is terminated or revoked sooner.    Influenza A by PCR NEGATIVE NEGATIVE Final   Influenza B by PCR NEGATIVE NEGATIVE Final    Comment: (NOTE) The Xpert Xpress SARS-CoV-2/FLU/RSV assay is intended as an aid in  the diagnosis of influenza from Nasopharyngeal swab specimens and  should not be used as a sole basis for treatment. Nasal washings and  aspirates are unacceptable for Xpert Xpress SARS-CoV-2/FLU/RSV  testing. Fact Sheet for Patients: PinkCheek.be Fact Sheet for Healthcare Providers: GravelBags.it This test is not yet approved or cleared by the [place] FDA and  has been authorized for detection and/or diagnosis of SARS-CoV-2 by  FDA under an Emergency Use Authorization (EUA). This EUA will remain  in effect (meaning this test can be used) for the duration of the  Covid-19 declaration under Section 564(b)(1) of the Act, 21  U.S.C. section 360bbb-3(b)(1), unless the authorization is  terminated or revoked. Performed at [facility], [address] [place], [place] [address]   MRSA PCR Screening     Status: None   Collection Time:  02/29/20  3:18 PM   Specimen: Nasal Mucosa; Nasopharyngeal  Result Value Ref Range Status   MRSA by PCR NEGATIVE NEGATIVE Final    Comment:        The GeneXpert MRSA Assay (FDA approved for NASAL specimens only), is one component of a comprehensive MRSA colonization surveillance program. It is not intended to diagnose MRSA infection nor to guide or monitor treatment for MRSA infections. Performed at [facility], [address], [place], [place] [address]          Radiology Studies: CT Head Wo Contrast  Result Date: 02/29/2020 CLINICAL DATA:  79 year old female with history of encephalopathy. Altered mental status after being given pain medication. EXAM: CT HEAD WITHOUT CONTRAST TECHNIQUE: Contiguous axial images were obtained from the base of the skull through the vertex without intravenous contrast. COMPARISON:  Head CT 12/29/2019. FINDINGS: Brain: Mild cerebral atrophy. Patchy and confluent areas of decreased attenuation are noted throughout the deep and periventricular white matter of the cerebral hemispheres bilaterally, compatible with chronic microvascular ischemic disease. No evidence of acute infarction, hemorrhage, hydrocephalus, extra-axial collection or mass lesion/mass effect. Vascular: Diffuse dural enhancement and high attenuation in the dural venous sinuses related to recent contrast enhanced CT the abdomen and pelvis from earlier today. No hyperdense vessel or unexpected calcification. Skull: Normal. Negative for fracture or focal lesion. Sinuses/Orbits: No acute finding. Other: None. IMPRESSION: 1. No acute intracranial abnormalities. 2. Mild cerebral atrophy with chronic microvascular ischemic changes in the cerebral white matter, as above. Electronically Signed   By: Vinnie Langton M.D.   On: 02/29/2020 08:38   CT ABDOMEN PELVIS W CONTRAST  Result Date: 02/29/2020 CLINICAL DATA:  Lower abdominal pain with nausea and vomiting EXAM: CT ABDOMEN AND PELVIS WITH CONTRAST  TECHNIQUE: Multidetector CT imaging of the abdomen and pelvis was performed using the standard protocol following bolus administration of intravenous contrast. CONTRAST:  43mL OMNIPAQUE IOHEXOL 300 MG/ML  SOLN COMPARISON:  07/18/2016 FINDINGS: Lower chest:  Mild atelectasis at the bases. Hepatobiliary: No focal liver abnormality.Limited mural calcification of the posterior wall gallbladder, not significantly  progressed. No acute biliary inflammation or ductal dilatation. Pancreas: Unremarkable. Spleen: Unremarkable. Adrenals/Urinary Tract: Negative adrenals. Right hydroureteronephrosis and delayed excretion with avid urothelial enhancement related to a triangular stone the ureter just above the UVJ, 6 x 7 mm. Associated asymmetric right perinephric stranding is also attributed to the obstruction. There are lower pole calculi measuring up to 3 mm. Atrophic left kidney with diffuse cortical thinning and a dominant cyst that is simple. There are 3 dominant calculi measuring up to 9 mm. Prominent left upper urothelial enhancement but no hydronephrosis or ureteral obstructing stone. Unremarkable bladder. Stomach/Bowel: No obstruction. No evidence of bowel inflammation. Enterocolonic anastomosis in the right lower quadrant. Vascular/Lymphatic: No acute vascular abnormality. Scattered atherosclerotic calcification of the aorta. No mass or adenopathy. Reproductive:Hysterectomy. Other: No ascites or pneumoperitoneum. Small umbilical hernia containing fat and transverse colon. Musculoskeletal: No acute abnormalities. Advanced lumbar spine degeneration with scoliosis. Sclerosis of the lower sacrum attributed to nonacute insufficiency fracture. IMPRESSION: 1. Obstructing 7 mm stone in the distal right ureter, just above the UVJ. 2. Bilateral nephrolithiasis and atrophic left kidney. 3. Limited mural calcification of the gallbladder. 4. Small umbilical hernia containing transverse colon. 5. Additional incidental findings noted  above. Electronically Signed   By: Monte Fantasia M.D.   On: 02/29/2020 05:53   DG Abdomen Acute W/Chest  Result Date: 02/29/2020 CLINICAL DATA:  Lower abdominal pain and nausea and vomiting EXAM: DG ABDOMEN ACUTE W/ 1V CHEST COMPARISON:  12/18/2019, 08/01/2011 FINDINGS: Cardiac shadow is within normal limits. Aortic calcifications are seen. The lungs are well aerated bilaterally. No focal infiltrate or sizable effusion is seen. Old rib deformities are again seen. Scoliosis is again noted stable in appearance. Scattered large and small bowel gas is noted. No free air is seen. Degenerative changes of lumbar spine are again noted. Scattered calcifications are noted in the left mid abdomen consistent with nonobstructing renal calculi. IMPRESSION: Nonobstructing left renal stones. No acute abnormality noted.  No free air is seen. Electronically Signed   By: Inez Catalina M.D.   On: 02/29/2020 03:47   DG C-Arm 1-60 Min-No Report  Result Date: 02/29/2020 Fluoroscopy was utilized by the requesting physician.  No radiographic interpretation.        Scheduled Meds: . Chlorhexidine Gluconate Cloth  6 each Topical Daily  . citalopram  20 mg Oral Daily  . cyanocobalamin  1,000 mcg Subcutaneous Q30 days  . cycloSPORINE  1 drop Both Eyes BID  . enoxaparin (LOVENOX) injection  30 mg Subcutaneous Q24H  . levETIRAcetam  500 mg Oral BID  . LORazepam  0-4 mg Intravenous Q4H   Followed by  . [START ON 03/03/2020] LORazepam  0-4 mg Intravenous Q8H  . multivitamin with minerals  1 tablet Oral Daily  . potassium chloride  10 mEq Oral Daily  . thiamine  100 mg Oral Daily   Continuous Infusions: . sodium chloride 125 mL/hr at 03/01/20 1718  . meropenem (MERREM) IV Stopped (03/01/20 1010)     LOS: 1 day    Time spent: 45mins    Kathie Dike, MD [facility]   If 7PM-7AM, please contact night-coverage [email or web address]  03/01/2020, 7:39 PM

## 2020-03-01 NOTE — Progress Notes (Signed)
Dr. Darrick Meigs paged and made aware of patients HR sustaining in the 130s. Waiting for call back/orders. Will continue to monitor pt

## 2020-03-01 NOTE — Progress Notes (Signed)
Pharmacy Antibiotic Note  [name] is a 79 y.o. female admitted on 02/29/2020 with bacteremia and UTI.  Pharmacy has been consulted for Merrem dosing.  Plan: Meropenem 500 mg IV every 12 hours. Monitor labs, c/s, and patient improvement.  Height: 4\' 11"  (149.9 cm) Weight: 47 kg (103 lb 9.9 oz) IBW/kg (Calculated) : 43.2  Temp (24hrs), Avg:98.5 F (36.9 C), Min:97.7 F (36.5 C), Max:101.7 F (38.7 C)  Recent Labs  Lab 02/29/20 0251 02/29/20 0849 02/29/20 1622 02/29/20 1851 03/01/20 0421 03/01/20 0713  WBC 12.1*  --   --   --   --  23.5*  CREATININE 0.94  --   --   --  1.42*  --   LATICACIDVEN 1.2 4.2* 3.3* 5.5*  --   --     Estimated Creatinine Clearance: 22.3 mL/min (A) (by C-G formula based on SCr of 1.42 mg/dL (H)).    Allergies  Allergen Reactions  . Clindamycin Rash  . Doxycycline Hyclate Rash    Antimicrobials this admission: Merrem 4/1 >>  CTX 3/31 >> 4/1  Dose adjustments this admission: [place]  Microbiology results: 3/31 BCx: E. coli 3/31 UCx: gram neg rods  3/31 MRSA PCR: neg  Thank you for allowing pharmacy to be a part of this patient's care.  Margot Ables, PharmD Clinical Pharmacist 03/01/2020 8:43 AM

## 2020-03-01 NOTE — Progress Notes (Signed)
CRITICAL VALUE ALERT  Critical Value:  Blood culture + for gram (-) rods aerobic bottle   Date & Time Notied:  03/01/20 @ 1856  Provider Notified: Dr. Darrick Meigs  Orders Received/Actions taken: Waiting for orders/call back

## 2020-03-01 NOTE — Progress Notes (Signed)
CRITICAL VALUE ALERT  Critical Value:  Lactic 2.3  Date & Time Notied: 03/01/2020 1650  Provider Notified: Dr Roderic Palau  Orders Received/Actions taken: trending down

## 2020-03-02 ENCOUNTER — Inpatient Hospital Stay (HOSPITAL_COMMUNITY): Payer: Medicare Other

## 2020-03-02 LAB — GLUCOSE, CAPILLARY
Glucose-Capillary: 53 mg/dL — ABNORMAL LOW (ref 70–99)
Glucose-Capillary: 83 mg/dL (ref 70–99)

## 2020-03-02 LAB — CBC
HCT: 28.7 % — ABNORMAL LOW (ref 36.0–46.0)
Hemoglobin: 8.8 g/dL — ABNORMAL LOW (ref 12.0–15.0)
MCH: 27.8 pg (ref 26.0–34.0)
MCHC: 30.7 g/dL (ref 30.0–36.0)
MCV: 90.8 fL (ref 80.0–100.0)
Platelets: 31 10*3/uL — ABNORMAL LOW (ref 150–400)
RBC: 3.16 MIL/uL — ABNORMAL LOW (ref 3.87–5.11)
RDW: 16.5 % — ABNORMAL HIGH (ref 11.5–15.5)
WBC: 20 10*3/uL — ABNORMAL HIGH (ref 4.0–10.5)
nRBC: 0 % (ref 0.0–0.2)

## 2020-03-02 LAB — BASIC METABOLIC PANEL
Anion gap: 10 (ref 5–15)
BUN: 26 mg/dL — ABNORMAL HIGH (ref 8–23)
CO2: 16 mmol/L — ABNORMAL LOW (ref 22–32)
Calcium: 6.6 mg/dL — ABNORMAL LOW (ref 8.9–10.3)
Chloride: 115 mmol/L — ABNORMAL HIGH (ref 98–111)
Creatinine, Ser: 1.26 mg/dL — ABNORMAL HIGH (ref 0.44–1.00)
GFR calc Af Amer: 47 mL/min — ABNORMAL LOW (ref >60)
GFR calc non Af Amer: 41 mL/min — ABNORMAL LOW (ref >60)
Glucose, Bld: 60 mg/dL — ABNORMAL LOW (ref 70–99)
Potassium: 3.8 mmol/L (ref 3.5–5.1)
Sodium: 141 mmol/L (ref 135–145)

## 2020-03-02 LAB — LACTIC ACID, PLASMA: Lactic Acid, Venous: 1.1 mmol/L (ref 0.5–1.9)

## 2020-03-02 LAB — URINE CULTURE: Culture: >=100000 — AB

## 2020-03-02 MED ORDER — SODIUM CHLORIDE 0.9 % IV SOLN
1.0000 g | Freq: Two times a day (BID) | INTRAVENOUS | Status: DC
  Administered 2020-03-02: 1 g via INTRAVENOUS
  Filled 2020-03-02: qty 1

## 2020-03-02 MED ORDER — SODIUM BICARBONATE 8.4 % IV SOLN
INTRAVENOUS | Status: DC
  Filled 2020-03-02 (×6): qty 1000

## 2020-03-02 MED ORDER — DEXTROSE 50 % IV SOLN
INTRAVENOUS | Status: AC
  Filled 2020-03-02: qty 50

## 2020-03-02 MED ORDER — METOPROLOL TARTRATE 5 MG/5ML IV SOLN
5.0000 mg | Freq: Four times a day (QID) | INTRAVENOUS | Status: DC
  Administered 2020-03-02 – 2020-03-05 (×13): 5 mg via INTRAVENOUS
  Filled 2020-03-02 (×13): qty 5

## 2020-03-02 MED ORDER — CEFAZOLIN SODIUM-DEXTROSE 2-4 GM/100ML-% IV SOLN
2.0000 g | Freq: Two times a day (BID) | INTRAVENOUS | Status: DC
  Administered 2020-03-03: 2 g via INTRAVENOUS
  Filled 2020-03-02: qty 100

## 2020-03-02 NOTE — Progress Notes (Signed)
Pt is alert and oriented to Place, year and self(name) PT continues as previous shift noted as needing to be repositioned due to putting herself into a fetal position and will not allow staff to correct her position a second time despite attempts at educating her on the importance of body position and periodic movement.  Any attempts at care result in her yelling at staff and verbal threats.  Refuses second repositioning and most care attempts.\ Pillows are now in place to help avoid any skin contact points

## 2020-03-02 NOTE — Progress Notes (Signed)
Attempted to educate pt on nutritional intake and how it contributes to her healing and blood glucose control. Pt does not want to be educated and states she drinks enough. Offered nutritional aids like boost and ensure, pt refused.

## 2020-03-02 NOTE — Progress Notes (Signed)
Pt screaming at staff to stop touching her, no one had touched the pt. Attempted to calm the pt and tell her we were going to move her in the bed since she was in a sickle shape and at risk for falling. This RN and NT, Izora Gala, moved pt up in bed with use of draw sheet and pt began yelling again to quit moving her. Attempted to educate pt on importance of moving and reduction of bed sores. Pt non-cooperative with teaching. Refusing meal tray at this time.

## 2020-03-02 NOTE — Progress Notes (Signed)
Pt verbally aggressive to staff stating she does not want this RN's "fucking help". Advised pt we do not tolerate verbal abuse to staff and we are only doing our job to care for her in the best way possible.

## 2020-03-02 NOTE — Progress Notes (Signed)
Pharmacy Antibiotic Note  [name] is a 79 y.o. female admitted on 02/29/2020 with bacteremia and UTI.  Pharmacy has been consulted for Merrem dosing. CrCl improved, bacteremic with E.Coli, awaiting sensitivities.  Plan: Increase Meropenem 1000 mg IV every 12 hours. Monitor labs, c/s, and patient improvement.  Height: 4\' 11"  (149.9 cm) Weight: 44.5 kg (98 lb 1.7 oz) IBW/kg (Calculated) : 43.2  Temp (24hrs), Avg:98.5 F (36.9 C), Min:97.7 F (36.5 C), Max:99.9 F (37.7 C)  Recent Labs  Lab 02/29/20 0251 02/29/20 0251 02/29/20 0849 02/29/20 1622 02/29/20 1851 03/01/20 0421 03/01/20 0713 03/01/20 1541 03/02/20 0430  WBC 12.1*  --   --   --   --   --  23.5*  --  20.0*  CREATININE 0.94  --   --   --   --  1.42*  --   --  1.26*  LATICACIDVEN 1.2   < > 4.2* 3.3* 5.5*  --   --  2.3* 1.1   < > = values in this interval not displayed.    Estimated Creatinine Clearance: 25.1 mL/min (A) (by C-G formula based on SCr of 1.26 mg/dL (H)).    Allergies  Allergen Reactions  . Clindamycin Rash  . Doxycycline Hyclate Rash    Antimicrobials this admission: Merrem 4/1 >>  CTX 3/31 >> 4/1  Dose adjustments this admission: [place]  Microbiology results: 3/31 BCx: E. coli 3/31 UCx: gram neg rods  3/31 MRSA PCR: neg  Thank you for allowing pharmacy to be a part of this patient's care.  Isac Sarna, BS Pharm D, [place] Clinical Pharmacist Pager [phone] 03/02/2020 7:49 AM

## 2020-03-02 NOTE — Progress Notes (Addendum)
Hypoglycemic Event  CBG: 53  Treatment: 8 oz juice/soda  Symptoms: None  Follow-up CBG: [id number] CBG Result: 83  Possible Reasons for Event: Inadequate meal intake  Comments/MD notified:Memon    Julieanne Manson

## 2020-03-02 NOTE — Progress Notes (Signed)
PROGRESS NOTE    [name]  WCB:[id number] DOB: 11/27/1941 DOA: 02/29/2020 PCP: Asencion Noble, MD    Brief Narrative:  79 year old female with a history of prior kidney stones, admitted to the hospital with abdominal pain.  She was found to have an obstructed renal stone and evidence of UTI/sepsis.  Blood cultures positive for E. coli.  She was seen by urology and underwent bilateral stent placement.   Assessment & Plan:   Active Problems:   Right ureteral stone   Seizures (HCC)   Sepsis (HCC)   Acute lower UTI   1. E. coli sepsis, secondary to urinary tract infection from obstructed ureteral stone.    Blood cultures positive for E. coli.  Transition meropenem to cefazolin based on culture sensitivities.  Blood pressures have improved, but she still tachycardic.  Check EKG and consider beta-blockers. Lactic acid trended down to normal.  2. 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  Seen by urology, status post balloon dilation of UPJ stricture and bilateral JJ stent placement 3. Acute kidney injury.  Likely related to sepsis.  Continue IV hydration and monitor urine output.  Creatinine slowly trending down. 4. Non-anion gap metabolic acidosis related to hyperchloremia.  Will start on bicarbonate infusion. 5. Urinary tract infection.    Urine culture positive for E. coli.  Meropenem changed to cefazolin based on culture sensitivities 6. Seizures.  Continue on Keppra. 7. Previous history of alcohol use.  She reports that she has not had any alcohol in several months.  Currently, she is still tachycardic and having periods of confusion, will place on CIWA protocol 8. Thrombocytopenia.  Suspect this is related to sepsis.  Although she was on Lovenox, timing of thrombocytopenia not consistent with HIT.  Will check DIC panel. Currently, no signs of bleeding. 9. Anemia.  No signs of bleeding.  Check anemia panel.   DVT prophylaxis: SCDs Code Status: Full code Family  Communication: Updated patient's daughter [name] Disposition Plan: Patient is from home.  Will need physical therapy evaluation to determine appropriate disposition.  Discharge once hemodynamic status has stabilized   Consultants:   Urology  Procedures:   3/31: balloon dilation of UPJ stricture and bilateral JJ stent placement  Antimicrobials:   Ceftriaxone 3/31 > 4/1  Meropenem 4/1 >4/2  Cefazolin 4/2 >   Subjective: Denies any abdominal pain.  No nausea or vomiting.  Asks to drink water.  Denies any shortness of breath.  Objective: Vitals:   03/02/20 1700 03/02/20 1800 03/02/20 1830 03/02/20 1831  BP: (!) 142/85 129/74    Pulse: 84 71 71 73  Resp: 19 (!) 22 (!) 25 (!) 24  Temp:      TempSrc:      SpO2: 97% 100% 100% 99%  Weight:      Height:        Intake/Output Summary (Last 24 hours) at 03/02/2020 1859 Last data filed at 03/02/2020 1832 Gross per 24 hour  Intake 2822.45 ml  Output 1920 ml  Net 902.45 ml   Filed Weights   02/29/20 0208 02/29/20 1517 03/02/20 0500  Weight: 39.9 kg 47 kg 44.5 kg    Examination:  General exam: Somnolent, wakes up to voice.  Answers questions appropriately.  No distress Respiratory system: Clear to auscultation. Respiratory effort normal. Cardiovascular system:RRR. No murmurs, rubs, gallops. Gastrointestinal system: Abdomen is nondistended, soft and nontender. No organomegaly or masses felt. Normal bowel sounds heard. Central nervous system: Alert and oriented. No focal neurological deficits. Extremities: No C/C/E, +  pedal pulses Skin: No rashes, lesions or ulcers Psychiatry: Judgement and insight appear normal. Mood & affect appropriate.       Data Reviewed: I have personally reviewed following labs and imaging studies  CBC: Recent Labs  Lab 02/29/20 0251 03/01/20 0713 03/02/20 0430  WBC 12.1* 23.5* 20.0*  NEUTROABS 10.9*  --   --   HGB 10.9* 9.9* 8.8*  HCT 35.1* 32.0* 28.7*  MCV 90.5 92.8 90.8  PLT 195 77* 31*    Basic Metabolic Panel: Recent Labs  Lab 02/29/20 0251 03/01/20 0421 03/02/20 0430  NA 137 138 141  K 4.8 4.3 3.8  CL 103 109 115*  CO2 23 20* 16*  GLUCOSE 143* 67* 60*  BUN 16 26* 26*  CREATININE 0.94 1.42* 1.26*  CALCIUM 9.0 6.8* 6.6*   GFR: Estimated Creatinine Clearance: 25.1 mL/min (A) (by C-G formula based on SCr of 1.26 mg/dL (H)). Liver Function Tests: Recent Labs  Lab 02/29/20 0251 03/01/20 0421  AST 27 19  ALT 16 12  ALKPHOS 77 36*  BILITOT 0.5 0.3  PROT 6.6 4.6*  ALBUMIN 3.8 2.3*   Recent Labs  Lab 02/29/20 0251  LIPASE 32   No results for input(s): AMMONIA in the last 168 hours. Coagulation Profile: No results for input(s): INR, PROTIME in the last 168 hours. Cardiac Enzymes: No results for input(s): CKTOTAL, CKMB, CKMBINDEX, TROPONINI in the last 168 hours. BNP (last 3 results) No results for input(s): PROBNP in the last 8760 hours. HbA1C: No results for input(s): HGBA1C in the last 72 hours. CBG: Recent Labs  Lab 03/01/20 0657 03/02/20 0727 03/02/20 0754  GLUCAP 84 53* 83   Lipid Profile: No results for input(s): CHOL, HDL, LDLCALC, TRIG, CHOLHDL, LDLDIRECT in the last 72 hours. Thyroid Function Tests: No results for input(s): TSH, T4TOTAL, FREET4, T3FREE, THYROIDAB in the last 72 hours. Anemia Panel: No results for input(s): VITAMINB12, FOLATE, FERRITIN, TIBC, IRON, RETICCTPCT in the last 72 hours. Sepsis Labs: Recent Labs  Lab 02/29/20 1622 02/29/20 1851 03/01/20 1541 03/02/20 0430  LATICACIDVEN 3.3* 5.5* 2.3* 1.1    Recent Results (from the past 240 hour(s))  Urine Culture     Status: Abnormal   Collection Time: 02/29/20  2:20 AM   Specimen: Urine, Clean Catch  Result Value Ref Range Status   Specimen Description   Final    URINE, CLEAN CATCH Performed at [facility], [address], [place], [place] [address]    Special Requests   Final    NONE Performed at [facility], [address], [place], [place] [address]     Culture >=100,000 COLONIES/mL ESCHERICHIA COLI (A)  Final   Report Status 03/02/2020 FINAL  Final   Organism ID, Bacteria ESCHERICHIA COLI (A)  Final      Susceptibility   Escherichia coli - MIC*    AMPICILLIN <=2 SENSITIVE Sensitive     CEFAZOLIN <=4 SENSITIVE Sensitive     CEFTRIAXONE <=0.25 SENSITIVE Sensitive     CIPROFLOXACIN <=0.25 SENSITIVE Sensitive     GENTAMICIN <=1 SENSITIVE Sensitive     IMIPENEM <=0.25 SENSITIVE Sensitive     NITROFURANTOIN <=16 SENSITIVE Sensitive     TRIMETH/SULFA <=20 SENSITIVE Sensitive     AMPICILLIN/SULBACTAM <=2 SENSITIVE Sensitive     PIP/TAZO <=4 SENSITIVE Sensitive     * >=100,000 COLONIES/mL ESCHERICHIA COLI  Culture, blood (routine x 2)     Status: None (Preliminary result)   Collection Time: 02/29/20  8:49 AM   Specimen: BLOOD RIGHT HAND  Result Value Ref Range Status   Specimen Description   Final    BLOOD RIGHT HAND Performed at [facility], [address], [place], [place] [address]    Special Requests   Final    BOTTLES DRAWN AEROBIC AND ANAEROBIC Blood Culture adequate volume Performed at [facility], [address], [place], [place] [address]    Culture  Setup Time   Final    AEROBIC BOTTLE ONLY GRAM NEGATIVE RODS Gram Stain Report Called to,Read Back By and Verified With: A AMBURN,RN@0245  03/01/2020 [facility] Performed at [facility], [address], [place], [place] [address]    Culture GRAM NEGATIVE RODS  Final   Report Status PENDING  Incomplete  Culture, blood (routine x 2)     Status: Abnormal (Preliminary result)   Collection Time: 02/29/20  8:49 AM   Specimen: Right Antecubital; Blood  Result Value Ref Range Status   Specimen Description   Final    RIGHT ANTECUBITAL Performed at [facility], [address], [place], [place] [address]    Special Requests   Final    BOTTLES DRAWN AEROBIC AND ANAEROBIC Blood Culture adequate volume Performed at [facility], [address] [place], [place] [address]    Culture  Setup  Time   Final    AEROBIC BOTTLE ONLY GRAM NEGATIVE RODS Gram Stain Report Called to,Read Back By and Verified With: A AMBURN,RN @0355  03/01/20 [facility] CRITICAL RESULT CALLED TO, READ BACK BY AND VERIFIED WITH: PHARMD G COFFEE [id number] AT 78 AM BY CM    Culture (A)  Final    ESCHERICHIA COLI SUSCEPTIBILITIES TO FOLLOW Performed at [facility], [place] [address] [place], [place] [address]    Report Status PENDING  Incomplete  Blood Culture ID Panel (Reflexed)     Status: Abnormal   Collection Time: 02/29/20  8:49 AM  Result Value Ref Range Status   Enterococcus species NOT DETECTED NOT DETECTED Final   Listeria monocytogenes NOT DETECTED NOT DETECTED Final   Staphylococcus species NOT DETECTED NOT DETECTED Final   Staphylococcus aureus (BCID) NOT DETECTED NOT DETECTED Final   Streptococcus species NOT DETECTED NOT DETECTED Final   Streptococcus agalactiae NOT DETECTED NOT DETECTED Final   Streptococcus pneumoniae NOT DETECTED NOT DETECTED Final   Streptococcus pyogenes NOT DETECTED NOT DETECTED Final   Acinetobacter baumannii NOT DETECTED NOT DETECTED Final   Enterobacteriaceae species DETECTED (A) NOT DETECTED Final    Comment: Enterobacteriaceae represent a large family of gram-negative bacteria, not a single organism. CRITICAL RESULT CALLED TO, READ BACK BY AND VERIFIED WITH: PHARMD G COFFEE [id number] AT 676 AM BY CM    Enterobacter cloacae complex NOT DETECTED NOT DETECTED Final   Escherichia coli DETECTED (A) NOT DETECTED Final    Comment: CRITICAL RESULT CALLED TO, READ BACK BY AND VERIFIED WITH: PHARMD G COFFEE [id number] AT 720 AM BY CM    Klebsiella oxytoca NOT DETECTED NOT DETECTED Final   Klebsiella pneumoniae NOT DETECTED NOT DETECTED Final   Proteus species NOT DETECTED NOT DETECTED Final   Serratia marcescens NOT DETECTED NOT DETECTED Final   Carbapenem resistance NOT DETECTED NOT DETECTED Final   Haemophilus influenzae NOT DETECTED NOT DETECTED Final   Neisseria  meningitidis NOT DETECTED NOT DETECTED Final   Pseudomonas aeruginosa NOT DETECTED NOT DETECTED Final   Candida albicans NOT DETECTED NOT DETECTED Final   Candida glabrata NOT DETECTED NOT DETECTED Final   Candida krusei NOT DETECTED NOT DETECTED Final   Candida parapsilosis NOT DETECTED  NOT DETECTED Final   Candida tropicalis NOT DETECTED NOT DETECTED Final    Comment: Performed at [facility], [place] [address], [place], [place] [address]  Respiratory Panel by RT PCR (Flu A&B, Covid) - Nasopharyngeal Swab     Status: None   Collection Time: 02/29/20  9:13 AM   Specimen: Nasopharyngeal Swab  Result Value Ref Range Status   SARS Coronavirus 2 by RT PCR NEGATIVE NEGATIVE Final    Comment: (NOTE) SARS-CoV-2 target nucleic acids are NOT DETECTED. The SARS-CoV-2 RNA is generally detectable in upper respiratoy specimens during the acute phase of infection. The lowest concentration of SARS-CoV-2 viral copies this assay can detect is 131 copies/mL. A negative result does not preclude SARS-Cov-2 infection and should not be used as the sole basis for treatment or other patient management decisions. A negative result may occur with  improper specimen collection/handling, submission of specimen other than nasopharyngeal swab, presence of viral mutation(s) within the areas targeted by this assay, and inadequate number of viral copies (<131 copies/mL). A negative result must be combined with clinical observations, patient history, and epidemiological information. The expected result is Negative. Fact Sheet for Patients:  PinkCheek.be Fact Sheet for Healthcare Providers:  GravelBags.it This test is not yet ap proved or cleared by the [place] FDA and  has been authorized for detection and/or diagnosis of SARS-CoV-2 by FDA under an Emergency Use Authorization (EUA). This EUA will remain  in effect (meaning this test can be used) for  the duration of the COVID-19 declaration under Section 564(b)(1) of the Act, 21 U.S.C. section 360bbb-3(b)(1), unless the authorization is terminated or revoked sooner.    Influenza A by PCR NEGATIVE NEGATIVE Final   Influenza B by PCR NEGATIVE NEGATIVE Final    Comment: (NOTE) The Xpert Xpress SARS-CoV-2/FLU/RSV assay is intended as an aid in  the diagnosis of influenza from Nasopharyngeal swab specimens and  should not be used as a sole basis for treatment. Nasal washings and  aspirates are unacceptable for Xpert Xpress SARS-CoV-2/FLU/RSV  testing. Fact Sheet for Patients: PinkCheek.be Fact Sheet for Healthcare Providers: GravelBags.it This test is not yet approved or cleared by the [place] FDA and  has been authorized for detection and/or diagnosis of SARS-CoV-2 by  FDA under an Emergency Use Authorization (EUA). This EUA will remain  in effect (meaning this test can be used) for the duration of the  Covid-19 declaration under Section 564(b)(1) of the Act, 21  U.S.C. section 360bbb-3(b)(1), unless the authorization is  terminated or revoked. Performed at [facility], [address] [place], [place] [address]   MRSA PCR Screening     Status: None   Collection Time: 02/29/20  3:18 PM   Specimen: Nasal Mucosa; Nasopharyngeal  Result Value Ref Range Status   MRSA by PCR NEGATIVE NEGATIVE Final    Comment:        The GeneXpert MRSA Assay (FDA approved for NASAL specimens only), is one component of a comprehensive MRSA colonization surveillance program. It is not intended to diagnose MRSA infection nor to guide or monitor treatment for MRSA infections. Performed at [facility], [address], [place], [place] [address]          Radiology Studies: DG CHEST PORT 1 VIEW  Result Date: 03/02/2020 CLINICAL DATA:  Shortness of breath. EXAM: PORTABLE CHEST 1 VIEW COMPARISON:  Chest radiograph 12/18/2019 FINDINGS:  Heart size within normal limits. There are hazy gradient opacities at the bilateral lung bases with blunting  of the lateral costophrenic angles. Pulmonary vascular congestion. No evidence of pneumothorax. No acute bony abnormality. Partially imaged left nephroureteral stent. Redemonstrated left renal calculi. Thoracolumbar dextrocurvature. No evidence of acute bony abnormality. IMPRESSION: Hazy gradient opacities at the bilateral lung bases with blunting of the lateral costophrenic angles. Findings likely reflect bilateral pleural effusions with bibasilar atelectasis. Pneumonia at the lung bases cannot be excluded. Pulmonary vascular congestion. Redemonstrated left renal calculi with partially imaged left nephroureteral stent. Electronically Signed   By: Kellie Simmering DO   On: 03/02/2020 10:08        Scheduled Meds: . Chlorhexidine Gluconate Cloth  6 each Topical Daily  . citalopram  20 mg Oral Daily  . cyanocobalamin  1,000 mcg Subcutaneous Q30 days  . cycloSPORINE  1 drop Both Eyes BID  . levETIRAcetam  500 mg Oral BID  . LORazepam  0-4 mg Intravenous Q4H   Followed by  . [START ON 03/03/2020] LORazepam  0-4 mg Intravenous Q8H  . metoprolol tartrate  5 mg Intravenous Q6H  . multivitamin with minerals  1 tablet Oral Daily  . thiamine  100 mg Oral Daily   Continuous Infusions: .  ceFAZolin (ANCEF) IV    . dextrose 5 % 1,000 mL with potassium chloride 20 mEq, sodium bicarbonate 150 mEq infusion 75 mL/hr at 03/02/20 1832     LOS: 2 days    Time spent: 27mins    Kathie Dike, MD [facility]   If 7PM-7AM, please contact night-coverage [email or web address]  03/02/2020, 6:59 PM

## 2020-03-02 NOTE — Care Management Important Message (Signed)
Important Message  Patient Details  Name: [name] MRN: [id number] Date of Birth: [birth year]   Medicare Important Message Given:  Yes     Tommy Medal 03/02/2020, 2:42 PM

## 2020-03-03 LAB — DIC (DISSEMINATED INTRAVASCULAR COAGULATION)PANEL
D-Dimer, Quant: 5.4 ug/mL-FEU — ABNORMAL HIGH (ref 0.00–0.50)
Fibrinogen: 624 mg/dL — ABNORMAL HIGH (ref 210–475)
INR: 1.2 (ref 0.8–1.2)
Platelets: 25 10*3/uL — CL (ref 150–400)
Prothrombin Time: 14.9 seconds (ref 11.4–15.2)
aPTT: 49 seconds — ABNORMAL HIGH (ref 24–36)

## 2020-03-03 LAB — COMPREHENSIVE METABOLIC PANEL
ALT: 10 U/L (ref 0–44)
AST: 18 U/L (ref 15–41)
Albumin: 2.2 g/dL — ABNORMAL LOW (ref 3.5–5.0)
Alkaline Phosphatase: 56 U/L (ref 38–126)
Anion gap: 10 (ref 5–15)
BUN: 16 mg/dL (ref 8–23)
CO2: 24 mmol/L (ref 22–32)
Calcium: 6.8 mg/dL — ABNORMAL LOW (ref 8.9–10.3)
Chloride: 108 mmol/L (ref 98–111)
Creatinine, Ser: 0.89 mg/dL (ref 0.44–1.00)
GFR calc Af Amer: >60 mL/min (ref >60)
GFR calc non Af Amer: >60 mL/min (ref >60)
Glucose, Bld: 134 mg/dL — ABNORMAL HIGH (ref 70–99)
Potassium: 3.2 mmol/L — ABNORMAL LOW (ref 3.5–5.1)
Sodium: 142 mmol/L (ref 135–145)
Total Bilirubin: 0.7 mg/dL (ref 0.3–1.2)
Total Protein: 4.4 g/dL — ABNORMAL LOW (ref 6.5–8.1)

## 2020-03-03 LAB — CULTURE, BLOOD (ROUTINE X 2)
Special Requests: ADEQUATE
Special Requests: ADEQUATE

## 2020-03-03 LAB — CBC
HCT: 25.8 % — ABNORMAL LOW (ref 36.0–46.0)
Hemoglobin: 8.2 g/dL — ABNORMAL LOW (ref 12.0–15.0)
MCH: 28.1 pg (ref 26.0–34.0)
MCHC: 31.8 g/dL (ref 30.0–36.0)
MCV: 88.4 fL (ref 80.0–100.0)
Platelets: 26 10*3/uL — CL (ref 150–400)
RBC: 2.92 MIL/uL — ABNORMAL LOW (ref 3.87–5.11)
RDW: 16.1 % — ABNORMAL HIGH (ref 11.5–15.5)
WBC: 10.7 10*3/uL — ABNORMAL HIGH (ref 4.0–10.5)
nRBC: 0 % (ref 0.0–0.2)

## 2020-03-03 LAB — FERRITIN: Ferritin: 50 ng/mL (ref 11–307)

## 2020-03-03 LAB — LACTATE DEHYDROGENASE: LDH: 159 U/L (ref 98–192)

## 2020-03-03 LAB — RETICULOCYTES
Immature Retic Fract: 21.8 % — ABNORMAL HIGH (ref 2.3–15.9)
RBC.: 2.96 MIL/uL — ABNORMAL LOW (ref 3.87–5.11)
Retic Count, Absolute: 22.2 10*3/uL (ref 19.0–186.0)
Retic Ct Pct: 0.8 % (ref 0.4–3.1)

## 2020-03-03 LAB — FOLATE: Folate: 9.4 ng/mL (ref >5.9)

## 2020-03-03 LAB — IRON AND TIBC
Iron: 7 ug/dL — ABNORMAL LOW (ref 28–170)
Saturation Ratios: 3 % — ABNORMAL LOW (ref 10.4–31.8)
TIBC: 223 ug/dL — ABNORMAL LOW (ref 250–450)
UIBC: 216 ug/dL

## 2020-03-03 LAB — MAGNESIUM: Magnesium: 1.7 mg/dL (ref 1.7–2.4)

## 2020-03-03 LAB — VITAMIN B12: Vitamin B-12: 3123 pg/mL — ABNORMAL HIGH (ref 180–914)

## 2020-03-03 MED ORDER — POTASSIUM CHLORIDE 10 MEQ/100ML IV SOLN
10.0000 meq | INTRAVENOUS | Status: AC
  Administered 2020-03-03 (×4): 10 meq via INTRAVENOUS
  Filled 2020-03-03: qty 100

## 2020-03-03 MED ORDER — LOPERAMIDE HCL 2 MG PO CAPS
2.0000 mg | ORAL_CAPSULE | Freq: Four times a day (QID) | ORAL | Status: DC | PRN
  Administered 2020-03-03: 2 mg via ORAL
  Filled 2020-03-03: qty 1

## 2020-03-03 MED ORDER — CEFAZOLIN SODIUM-DEXTROSE 2-4 GM/100ML-% IV SOLN
2.0000 g | Freq: Three times a day (TID) | INTRAVENOUS | Status: DC
  Administered 2020-03-03 – 2020-03-05 (×7): 2 g via INTRAVENOUS
  Filled 2020-03-03 (×7): qty 100

## 2020-03-03 NOTE — Progress Notes (Signed)
PROGRESS NOTE    [name]  JTT:[id number] DOB: [birth year] DOA: 02/29/2020 PCP: Asencion Noble, MD    Brief Narrative:  79 year old female with a history of prior kidney stones, admitted to the hospital with abdominal pain.  She was found to have an obstructed renal stone and evidence of UTI/sepsis.  Blood cultures positive for E. coli.  She was seen by urology and underwent bilateral stent placement.   Assessment & Plan:   Active Problems:   Right ureteral stone   Seizures (HCC)   Sepsis (HCC)   Acute lower UTI   1. E. coli sepsis, secondary to urinary tract infection from obstructed ureteral stone.    Blood cultures positive for E. coli.  Transition meropenem to cefazolin based on culture sensitivities.  Hemodynamics have improved.  Lactic acid trended down to normal. WBC trending down  2. 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  Seen by urology, status post balloon dilation of UPJ stricture and bilateral JJ stent placement 3. Acute kidney injury.  Likely related to sepsis.  Improved with IV hydration 4. Non-anion gap metabolic acidosis related to hyperchloremia.  Resolved with bicarbonate infusion 5. Urinary tract infection.    Urine culture positive for E. coli.  Meropenem changed to cefazolin based on culture sensitivities 6. Seizures.  Continue on Keppra. 7. Previous history of alcohol use.  She reports that she has not had any alcohol in several months.  Currently, she is still tachycardic and having periods of confusion, will place on CIWA protocol 8. Thrombocytopenia.  Likely related to DIC/sepsis. Schistocytes noted on smear, but LDH normal and bilirubin normal. No signs of bleeding at this time. Continue to follow 9. Anemia.  No signs of bleeding.  Anemia panel indicates iron deficiency, although likely has a component of critical illness. Continue to follow and transfuse for hemoglobin <7 10. Diarrhea. Patient reportedly had 3 BM today. If BMs persist, can  consider further stool studies. I suspect BMs may be related to antibiotics.   DVT prophylaxis: SCDs Code Status: Full code Family Communication: Updated patient's daughter [name] Disposition Plan: Patient is from home.  Will need physical therapy evaluation to determine appropriate disposition.  Discharge once hemodynamic status has stabilized   Consultants:   Urology  Procedures:   3/31: balloon dilation of UPJ stricture and bilateral JJ stent placement  Antimicrobials:   Ceftriaxone 3/31 > 4/1  Meropenem 4/1 >4/2  Cefazolin 4/2 >   Subjective: [name] Staff reports that she has had a 3 small bowel movements this morning   Objective: Vitals:   03/03/20 1239 03/03/20 1300 03/03/20 1600 03/03/20 1638  BP:  139/79 132/73   Pulse:  77 94   Resp:  17 (!) 24   Temp: 97.7 F (36.5 C)   98.1 F (36.7 C)  TempSrc: Axillary   Axillary  SpO2:  98% 93%   Weight:      Height:        Intake/Output Summary (Last 24 hours) at 03/03/2020 1722 Last data filed at 03/03/2020 1506 Gross per 24 hour  Intake 2350.08 ml  Output 2250 ml  Net 100.08 ml   Filed Weights   02/29/20 1517 03/02/20 0500 03/03/20 0410  Weight: 47 kg 44.5 kg 49.2 kg    Examination:  General exam: Alert, awake, oriented x 3 Respiratory system: Clear to auscultation. Respiratory effort normal. Cardiovascular system:RRR. No murmurs, rubs,  gallops. Gastrointestinal system: Abdomen is nondistended, soft and nontender. No organomegaly or masses felt. Normal bowel sounds heard. Central nervous system: Alert and oriented. No focal neurological deficits. Extremities: No C/C/E, +pedal pulses Skin: No rashes, lesions or ulcers Psychiatry: Judgement and insight appear normal. Mood & affect appropriate.    Data Reviewed: I have personally reviewed following labs and imaging  studies  CBC: Recent Labs  Lab 02/29/20 0251 03/01/20 0713 03/02/20 0430 03/03/20 0447  WBC 12.1* 23.5* 20.0* 10.7*  NEUTROABS 10.9*  --   --   --   HGB 10.9* 9.9* 8.8* 8.2*  HCT 35.1* 32.0* 28.7* 25.8*  MCV 90.5 92.8 90.8 88.4  PLT 195 77* 31* 25*  26*   Basic Metabolic Panel: Recent Labs  Lab 02/29/20 0251 03/01/20 0421 03/02/20 0430 03/03/20 0447 03/03/20 0907  NA 137 138 141 142  --   K 4.8 4.3 3.8 3.2*  --   CL 103 109 115* 108  --   CO2 23 20* 16* 24  --   GLUCOSE 143* 67* 60* 134*  --   BUN 16 26* 26* 16  --   CREATININE 0.94 1.42* 1.26* 0.89  --   CALCIUM 9.0 6.8* 6.6* 6.8*  --   MG  --   --   --   --  1.7   GFR: Estimated Creatinine Clearance: 35.5 mL/min (by C-G formula based on SCr of 0.89 mg/dL). Liver Function Tests: Recent Labs  Lab 02/29/20 0251 03/01/20 0421 03/03/20 0447  AST 27 19 18   ALT 16 12 10   ALKPHOS 77 36* 56  BILITOT 0.5 0.3 0.7  PROT 6.6 4.6* 4.4*  ALBUMIN 3.8 2.3* 2.2*   Recent Labs  Lab 02/29/20 0251  LIPASE 32   No results for input(s): AMMONIA in the last 168 hours. Coagulation Profile: Recent Labs  Lab 03/03/20 0447  INR 1.2   Cardiac Enzymes: No results for input(s): CKTOTAL, CKMB, CKMBINDEX, TROPONINI in the last 168 hours. BNP (last 3 results) No results for input(s): PROBNP in the last 8760 hours. HbA1C: No results for input(s): HGBA1C in the last 72 hours. CBG: Recent Labs  Lab 03/01/20 0657 03/02/20 0727 03/02/20 0754  GLUCAP 84 53* 83   Lipid Profile: No results for input(s): CHOL, HDL, LDLCALC, TRIG, CHOLHDL, LDLDIRECT in the last 72 hours. Thyroid Function Tests: No results for input(s): TSH, T4TOTAL, FREET4, T3FREE, THYROIDAB in the last 72 hours. Anemia Panel: Recent Labs    03/03/20 0447  VITAMINB12 3,123*  FOLATE 9.4  FERRITIN 50  TIBC 223*  IRON 7*  RETICCTPCT 0.8   Sepsis Labs: Recent Labs  Lab 02/29/20 1622 02/29/20 1851 03/01/20 1541 03/02/20 0430  LATICACIDVEN 3.3* 5.5*  2.3* 1.1    Recent Results (from the past 240 hour(s))  Urine Culture     Status: Abnormal   Collection Time: 02/29/20  2:20 AM   Specimen: Urine, Clean Catch  Result Value Ref Range Status   Specimen Description   Final    URINE, CLEAN CATCH Performed at [facility], [address], [place], [place] [address]    Special Requests   Final    NONE Performed at [facility], [address] [place], [place] [address]    Culture >=100,000 COLONIES/mL ESCHERICHIA COLI (A)  Final   Report Status 03/02/2020 FINAL  Final   Organism ID, Bacteria ESCHERICHIA COLI (A)  Final      Susceptibility   Escherichia coli - MIC*    AMPICILLIN <=2 SENSITIVE Sensitive  CEFAZOLIN <=4 SENSITIVE Sensitive     CEFTRIAXONE <=0.25 SENSITIVE Sensitive     CIPROFLOXACIN <=0.25 SENSITIVE Sensitive     GENTAMICIN <=1 SENSITIVE Sensitive     IMIPENEM <=0.25 SENSITIVE Sensitive     NITROFURANTOIN <=16 SENSITIVE Sensitive     TRIMETH/SULFA <=20 SENSITIVE Sensitive     AMPICILLIN/SULBACTAM <=2 SENSITIVE Sensitive     PIP/TAZO <=4 SENSITIVE Sensitive     * >=100,000 COLONIES/mL ESCHERICHIA COLI  Culture, blood (routine x 2)     Status: Abnormal   Collection Time: 02/29/20  8:49 AM   Specimen: BLOOD RIGHT HAND  Result Value Ref Range Status   Specimen Description   Final    BLOOD RIGHT HAND Performed at [facility], [address], [place], [place] [address]    Special Requests   Final    BOTTLES DRAWN AEROBIC AND ANAEROBIC Blood Culture adequate volume Performed at [facility] [address] [place], [place] [address]    Culture  Setup Time   Final    AEROBIC BOTTLE ONLY GRAM NEGATIVE RODS Gram Stain Report Called to,Read Back By and Verified With: A AMBURN,RN@0245  03/01/2020 [facility] Performed at [facility], [address], [place], [place] [address]    Culture (A)  Final    ESCHERICHIA COLI SUSCEPTIBILITIES PERFORMED ON PREVIOUS CULTURE WITHIN THE LAST 5 DAYS. Performed at [facility], [place] [address], [place], [place] [address]    Report Status 03/03/2020 FINAL  Final  Culture, blood (routine x 2)     Status: Abnormal   Collection Time: 02/29/20  8:49 AM   Specimen: Right Antecubital; Blood  Result Value Ref Range Status   Specimen Description   Final    RIGHT ANTECUBITAL Performed at [facility], [address] [place], [place] [address]    Special Requests   Final    BOTTLES DRAWN AEROBIC AND ANAEROBIC Blood Culture adequate volume Performed at [facility], [address], [place], [place] [address]    Culture  Setup Time   Final    AEROBIC BOTTLE ONLY GRAM NEGATIVE RODS Gram Stain Report Called to,Read Back By and Verified With: A AMBURN,RN @0355  03/01/20 [facility] CRITICAL RESULT CALLED TO, READ BACK BY AND VERIFIED WITH: PHARMD G COFFEE [id number] AT 830 AM BY CM Performed at [facility], [place] [address] [place], [place] [address]    Culture ESCHERICHIA COLI (A)  Final   Report Status 03/03/2020 FINAL  Final   Organism ID, Bacteria ESCHERICHIA COLI  Final      Susceptibility   Escherichia coli - MIC*    AMPICILLIN <=2 SENSITIVE Sensitive     CEFAZOLIN <=4 SENSITIVE Sensitive     CEFEPIME <=0.12 SENSITIVE Sensitive     CEFTAZIDIME <=1 SENSITIVE Sensitive     CEFTRIAXONE <=0.25 SENSITIVE Sensitive     CIPROFLOXACIN <=0.25 SENSITIVE Sensitive     GENTAMICIN <=1 SENSITIVE Sensitive     IMIPENEM <=0.25 SENSITIVE Sensitive     TRIMETH/SULFA <=20 SENSITIVE Sensitive     AMPICILLIN/SULBACTAM <=2 SENSITIVE Sensitive     PIP/TAZO <=4 SENSITIVE Sensitive     * ESCHERICHIA COLI  Blood Culture ID Panel (Reflexed)     Status: Abnormal   Collection Time: 02/29/20  8:49 AM  Result Value Ref Range Status   Enterococcus species NOT DETECTED NOT DETECTED Final   Listeria monocytogenes NOT DETECTED NOT DETECTED Final   Staphylococcus species NOT DETECTED NOT DETECTED Final   Staphylococcus aureus (BCID) NOT DETECTED NOT DETECTED Final  Streptococcus species NOT  DETECTED NOT DETECTED Final   Streptococcus agalactiae NOT DETECTED NOT DETECTED Final   Streptococcus pneumoniae NOT DETECTED NOT DETECTED Final   Streptococcus pyogenes NOT DETECTED NOT DETECTED Final   Acinetobacter baumannii NOT DETECTED NOT DETECTED Final   Enterobacteriaceae species DETECTED (A) NOT DETECTED Final    Comment: Enterobacteriaceae represent a large family of gram-negative bacteria, not a single organism. CRITICAL RESULT CALLED TO, READ BACK BY AND VERIFIED WITH: PHARMD G COFFEE [id number] AT 427 AM BY CM    Enterobacter cloacae complex NOT DETECTED NOT DETECTED Final   Escherichia coli DETECTED (A) NOT DETECTED Final    Comment: CRITICAL RESULT CALLED TO, READ BACK BY AND VERIFIED WITH: PHARMD G COFFEE [id number] AT 062 AM BY CM    Klebsiella oxytoca NOT DETECTED NOT DETECTED Final   Klebsiella pneumoniae NOT DETECTED NOT DETECTED Final   Proteus species NOT DETECTED NOT DETECTED Final   Serratia marcescens NOT DETECTED NOT DETECTED Final   Carbapenem resistance NOT DETECTED NOT DETECTED Final   Haemophilus influenzae NOT DETECTED NOT DETECTED Final   Neisseria meningitidis NOT DETECTED NOT DETECTED Final   Pseudomonas aeruginosa NOT DETECTED NOT DETECTED Final   Candida albicans NOT DETECTED NOT DETECTED Final   Candida glabrata NOT DETECTED NOT DETECTED Final   Candida krusei NOT DETECTED NOT DETECTED Final   Candida parapsilosis NOT DETECTED NOT DETECTED Final   Candida tropicalis NOT DETECTED NOT DETECTED Final    Comment: Performed at [facility], [place] [address] [place], [place] [address]  Respiratory Panel by RT PCR (Flu A&B, Covid) - Nasopharyngeal Swab     Status: None   Collection Time: 02/29/20  9:13 AM   Specimen: Nasopharyngeal Swab  Result Value Ref Range Status   SARS Coronavirus 2 by RT PCR NEGATIVE NEGATIVE Final    Comment: (NOTE) SARS-CoV-2 target nucleic acids are NOT DETECTED. The SARS-CoV-2 RNA is generally detectable in upper  respiratoy specimens during the acute phase of infection. The lowest concentration of SARS-CoV-2 viral copies this assay can detect is 131 copies/mL. A negative result does not preclude SARS-Cov-2 infection and should not be used as the sole basis for treatment or other patient management decisions. A negative result may occur with  improper specimen collection/handling, submission of specimen other than nasopharyngeal swab, presence of viral mutation(s) within the areas targeted by this assay, and inadequate number of viral copies (<131 copies/mL). A negative result must be combined with clinical observations, patient history, and epidemiological information. The expected result is Negative. Fact Sheet for Patients:  PinkCheek.be Fact Sheet for Healthcare Providers:  GravelBags.it This test is not yet ap proved or cleared by the [place] FDA and  has been authorized for detection and/or diagnosis of SARS-CoV-2 by FDA under an Emergency Use Authorization (EUA). This EUA will remain  in effect (meaning this test can be used) for the duration of the COVID-19 declaration under Section 564(b)(1) of the Act, 21 U.S.C. section 360bbb-3(b)(1), unless the authorization is terminated or revoked sooner.    Influenza A by PCR NEGATIVE NEGATIVE Final   Influenza B by PCR NEGATIVE NEGATIVE Final    Comment: (NOTE) The Xpert Xpress SARS-CoV-2/FLU/RSV assay is intended as an aid in  the diagnosis of influenza from Nasopharyngeal swab specimens and  should not be used as a sole basis for treatment. Nasal washings and  aspirates are unacceptable for Xpert Xpress SARS-CoV-2/FLU/RSV  testing. Fact Sheet for Patients: PinkCheek.be Fact Sheet for Healthcare Providers: GravelBags.it  This test is not yet approved or cleared by the [place] and  has been authorized for  detection and/or diagnosis of SARS-CoV-2 by  FDA under an Emergency Use Authorization (EUA). This EUA will remain  in effect (meaning this test can be used) for the duration of the  Covid-19 declaration under Section 564(b)(1) of the Act, 21  U.S.C. section 360bbb-3(b)(1), unless the authorization is  terminated or revoked. Performed at [facility], [address], [place], [place] [address]   MRSA PCR Screening     Status: None   Collection Time: 02/29/20  3:18 PM   Specimen: Nasal Mucosa; Nasopharyngeal  Result Value Ref Range Status   MRSA by PCR NEGATIVE NEGATIVE Final    Comment:        The GeneXpert MRSA Assay (FDA approved for NASAL specimens only), is one component of a comprehensive MRSA colonization surveillance program. It is not intended to diagnose MRSA infection nor to guide or monitor treatment for MRSA infections. Performed at [facility] [address], [place], [place] [address]          Radiology Studies: DG CHEST PORT 1 VIEW  Result Date: 03/02/2020 CLINICAL DATA:  Shortness of breath. EXAM: PORTABLE CHEST 1 VIEW COMPARISON:  Chest radiograph 12/18/2019 FINDINGS: Heart size within normal limits. There are hazy gradient opacities at the bilateral lung bases with blunting of the lateral costophrenic angles. Pulmonary vascular congestion. No evidence of pneumothorax. No acute bony abnormality. Partially imaged left nephroureteral stent. Redemonstrated left renal calculi. Thoracolumbar dextrocurvature. No evidence of acute bony abnormality. IMPRESSION: Hazy gradient opacities at the bilateral lung bases with blunting of the lateral costophrenic angles. Findings likely reflect bilateral pleural effusions with bibasilar atelectasis. Pneumonia at the lung bases cannot be excluded. Pulmonary vascular congestion. Redemonstrated left renal calculi with partially imaged left nephroureteral stent. Electronically Signed   By: Kellie Simmering DO   On: 03/02/2020 10:08         Scheduled Meds: . Chlorhexidine Gluconate Cloth  6 each Topical Daily  . citalopram  20 mg Oral Daily  . cyanocobalamin  1,000 mcg Subcutaneous Q30 days  . cycloSPORINE  1 drop Both Eyes BID  . levETIRAcetam  500 mg Oral BID  . LORazepam  0-4 mg Intravenous Q4H   Followed by  . LORazepam  0-4 mg Intravenous Q8H  . metoprolol tartrate  5 mg Intravenous Q6H  . multivitamin with minerals  1 tablet Oral Daily  . thiamine  100 mg Oral Daily   Continuous Infusions: .  ceFAZolin (ANCEF) IV Stopped (03/03/20 1451)     LOS: 3 days    Time spent: 10mins    Kathie Dike, MD [facility]   If 7PM-7AM, please contact night-coverage [email or web address]  03/03/2020, 5:22 PM

## 2020-03-03 NOTE — Progress Notes (Signed)
Patient is resting comfortably, is pleasant, and states that she is fine and has no problems or complaints at this time,

## 2020-03-03 NOTE — Progress Notes (Signed)
Pt once again verbally aggressive to staff and stating that we are "hurting" her when attempting to reposition pt using draw sheet. Advised pt again that we are attempting to prevent bedsores and doing it in the most gentle way possible. Education was not received by pt.

## 2020-03-03 NOTE — Progress Notes (Signed)
Pt given a drink this morning and immediately began coughing and pulling off her [place]. MD Memon at bedside. Attempted to educate pt on importance of oxygen d/t O2 sats dropping into the 80s. Pt kept removing [place]. SLP eval ordered.

## 2020-03-03 NOTE — Progress Notes (Signed)
MD Memon notified pt has had 3 small D/ stools today. Order placed for Imodium.

## 2020-03-04 LAB — COMPREHENSIVE METABOLIC PANEL
ALT: 8 U/L (ref 0–44)
AST: 16 U/L (ref 15–41)
Albumin: 2.2 g/dL — ABNORMAL LOW (ref 3.5–5.0)
Alkaline Phosphatase: 60 U/L (ref 38–126)
Anion gap: 8 (ref 5–15)
BUN: 11 mg/dL (ref 8–23)
CO2: 25 mmol/L (ref 22–32)
Calcium: 7 mg/dL — ABNORMAL LOW (ref 8.9–10.3)
Chloride: 106 mmol/L (ref 98–111)
Creatinine, Ser: 0.68 mg/dL (ref 0.44–1.00)
GFR calc Af Amer: >60 mL/min (ref >60)
GFR calc non Af Amer: >60 mL/min (ref >60)
Glucose, Bld: 115 mg/dL — ABNORMAL HIGH (ref 70–99)
Potassium: 2.9 mmol/L — ABNORMAL LOW (ref 3.5–5.1)
Sodium: 139 mmol/L (ref 135–145)
Total Bilirubin: 0.4 mg/dL (ref 0.3–1.2)
Total Protein: 4.9 g/dL — ABNORMAL LOW (ref 6.5–8.1)

## 2020-03-04 LAB — CBC
HCT: 26.3 % — ABNORMAL LOW (ref 36.0–46.0)
Hemoglobin: 8.3 g/dL — ABNORMAL LOW (ref 12.0–15.0)
MCH: 27.5 pg (ref 26.0–34.0)
MCHC: 31.6 g/dL (ref 30.0–36.0)
MCV: 87.1 fL (ref 80.0–100.0)
Platelets: 32 10*3/uL — ABNORMAL LOW (ref 150–400)
RBC: 3.02 MIL/uL — ABNORMAL LOW (ref 3.87–5.11)
RDW: 16.5 % — ABNORMAL HIGH (ref 11.5–15.5)
WBC: 9.1 10*3/uL (ref 4.0–10.5)
nRBC: 0 % (ref 0.0–0.2)

## 2020-03-04 MED ORDER — POTASSIUM CHLORIDE CRYS ER 20 MEQ PO TBCR
40.0000 meq | EXTENDED_RELEASE_TABLET | Freq: Once | ORAL | Status: AC
  Administered 2020-03-04: 40 meq via ORAL
  Filled 2020-03-04: qty 2

## 2020-03-04 MED ORDER — MAGNESIUM SULFATE 2 GM/50ML IV SOLN
2.0000 g | Freq: Once | INTRAVENOUS | Status: AC
  Administered 2020-03-04: 2 g via INTRAVENOUS
  Filled 2020-03-04: qty 50

## 2020-03-04 MED ORDER — POTASSIUM CHLORIDE 10 MEQ/100ML IV SOLN
10.0000 meq | INTRAVENOUS | Status: AC
  Administered 2020-03-04 (×4): 10 meq via INTRAVENOUS
  Filled 2020-03-04 (×4): qty 100

## 2020-03-04 NOTE — Progress Notes (Signed)
PROGRESS NOTE    [name]  LEX:[id number] DOB: [birth year] DOA: 02/29/2020 PCP: Angela Noble, MD    Brief Narrative:  79 year old female with a history of prior kidney stones, admitted to the hospital with abdominal pain.  She was found to have an obstructed renal stone and evidence of UTI/sepsis.  Blood cultures positive for E. coli.  She was seen by urology and underwent bilateral stent placement.   Assessment & Plan:   Active Problems:   Right ureteral stone   Seizures (HCC)   Sepsis (HCC)   Acute lower UTI   1. E. coli sepsis, secondary to urinary tract infection from obstructed ureteral stone.    Blood cultures positive for E. coli.  Transition meropenem to cefazolin based on culture sensitivities.  Hemodynamics have improved.  Lactic acid trended down to normal. WBC trended down to normal 2. 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  Seen by urology, status post balloon dilation of UPJ stricture and bilateral JJ stent placement 3. Acute kidney injury.  Likely related to sepsis.  Improved with IV hydration 4. Non-anion gap metabolic acidosis related to hyperchloremia.  Resolved with bicarbonate infusion 5. Urinary tract infection.    Urine culture positive for E. coli.  Meropenem changed to cefazolin based on culture sensitivities 6. Seizures.  Continue on Keppra. 7. Previous history of alcohol use.  She reports that she has not had any alcohol in several months.  Currently, she is still tachycardic and having periods of confusion, will place on CIWA protocol 8. Thrombocytopenia.  Likely related to DIC/sepsis. Schistocytes noted on smear, but LDH normal and bilirubin normal. No signs of bleeding at this time.  Platelets now trending up.  Continue to follow 9. Anemia.  No signs of bleeding.  Anemia panel indicates iron deficiency, although likely has a component of critical illness. Continue to follow and transfuse for hemoglobin <7 10. Hypokalemia.  Replace   DVT  prophylaxis: SCDs Code Status: Full code Family Communication: Updated patient's daughter [name] Disposition Plan: Patient is from home.  Will need physical therapy evaluation to determine appropriate disposition.  Discharge once hemodynamic status has stabilized   Consultants:   Urology  Procedures:   3/31: balloon dilation of UPJ stricture and bilateral JJ stent placement  Antimicrobials:   Ceftriaxone 3/31 > 4/1  Meropenem 4/1 >4/2  Cefazolin 4/2 >   Subjective: No shortness of breath, cough or abdominal pain.  Objective: Vitals:   03/04/20 0032 03/04/20 0434 03/04/20 1151 03/04/20 1359  BP: 139/82 (!) 147/72 (!) 151/81 (!) 141/79  Pulse: 75 74 99 81  Resp: 19 16  18   Temp: 99.9 F (37.7 C) 99.6 F (37.6 C)  99.1 F (37.3 C)  TempSrc: Oral Oral  Oral  SpO2: 95% 94%  98%  Weight:      Height:        Intake/Output Summary (Last 24 hours) at 03/04/2020 2014 Last data filed at 03/04/2020 1736 Gross per 24 hour  Intake 1077.87 ml  Output 1450 ml  Net -372.13 ml   Filed Weights   02/29/20 1517 03/02/20 0500 03/03/20 0410  Weight: 47 kg 44.5 kg 49.2 kg    Examination:  General exam: Alert, awake, oriented x 3 Respiratory system: Clear to auscultation. Respiratory effort normal. Cardiovascular system:RRR. No murmurs, rubs, gallops. Gastrointestinal system: Abdomen is nondistended, soft and nontender. No organomegaly or masses felt. Normal bowel sounds heard. Central nervous system: Alert and oriented. No focal neurological deficits. Extremities: No C/C/E, +pedal pulses Skin:  No rashes, lesions or ulcers Psychiatry: Judgement and insight appear normal. Mood & affect appropriate.    Data Reviewed: I have personally reviewed following labs and imaging studies  CBC: Recent Labs  Lab 02/29/20 0251 03/01/20 0713 03/02/20 0430 03/03/20 0447 03/04/20 0626  WBC 12.1* 23.5* 20.0* 10.7* 9.1  NEUTROABS 10.9*  --   --   --   --   HGB 10.9* 9.9* 8.8* 8.2* 8.3*    HCT 35.1* 32.0* 28.7* 25.8* 26.3*  MCV 90.5 92.8 90.8 88.4 87.1  PLT 195 77* 31* 25*  26* 32*   Basic Metabolic Panel: Recent Labs  Lab 02/29/20 0251 03/01/20 0421 03/02/20 0430 03/03/20 0447 03/03/20 0907 03/04/20 0626  NA 137 138 141 142  --  139  K 4.8 4.3 3.8 3.2*  --  2.9*  CL 103 109 115* 108  --  106  CO2 23 20* 16* 24  --  25  GLUCOSE 143* 67* 60* 134*  --  115*  BUN 16 26* 26* 16  --  11  CREATININE 0.94 1.42* 1.26* 0.89  --  0.68  CALCIUM 9.0 6.8* 6.6* 6.8*  --  7.0*  MG  --   --   --   --  1.7  --    GFR: Estimated Creatinine Clearance: 39.5 mL/min (by C-G formula based on SCr of 0.68 mg/dL). Liver Function Tests: Recent Labs  Lab 02/29/20 0251 03/01/20 0421 03/03/20 0447 03/04/20 0626  AST 27 19 18 16   ALT 16 12 10 8   ALKPHOS 77 36* 56 60  BILITOT 0.5 0.3 0.7 0.4  PROT 6.6 4.6* 4.4* 4.9*  ALBUMIN 3.8 2.3* 2.2* 2.2*   Recent Labs  Lab 02/29/20 0251  LIPASE 32   No results for input(s): AMMONIA in the last 168 hours. Coagulation Profile: Recent Labs  Lab 03/03/20 0447  INR 1.2   Cardiac Enzymes: No results for input(s): CKTOTAL, CKMB, CKMBINDEX, TROPONINI in the last 168 hours. BNP (last 3 results) No results for input(s): PROBNP in the last 8760 hours. HbA1C: No results for input(s): HGBA1C in the last 72 hours. CBG: Recent Labs  Lab 03/01/20 0657 03/02/20 0727 03/02/20 0754  GLUCAP 84 53* 83   Lipid Profile: No results for input(s): CHOL, HDL, LDLCALC, TRIG, CHOLHDL, LDLDIRECT in the last 72 hours. Thyroid Function Tests: No results for input(s): TSH, T4TOTAL, FREET4, T3FREE, THYROIDAB in the last 72 hours. Anemia Panel: Recent Labs    03/03/20 0447  VITAMINB12 3,123*  FOLATE 9.4  FERRITIN 50  TIBC 223*  IRON 7*  RETICCTPCT 0.8   Sepsis Labs: Recent Labs  Lab 02/29/20 1622 02/29/20 1851 03/01/20 1541 03/02/20 0430  LATICACIDVEN 3.3* 5.5* 2.3* 1.1    Recent Results (from the past 240 hour(s))  Urine Culture      Status: Abnormal   Collection Time: 02/29/20  2:20 AM   Specimen: Urine, Clean Catch  Result Value Ref Range Status   Specimen Description   Final    URINE, CLEAN CATCH Performed at [facility], [address], [place], [place] [address]    Special Requests   Final    NONE Performed at [facility], [address], [place], [place] [address]    Culture >=100,000 COLONIES/mL ESCHERICHIA COLI (A)  Final   Report Status 03/02/2020 FINAL  Final   Organism ID, Bacteria ESCHERICHIA COLI (A)  Final      Susceptibility   Escherichia coli - MIC*    AMPICILLIN <=2 SENSITIVE Sensitive  CEFAZOLIN <=4 SENSITIVE Sensitive     CEFTRIAXONE <=0.25 SENSITIVE Sensitive     CIPROFLOXACIN <=0.25 SENSITIVE Sensitive     GENTAMICIN <=1 SENSITIVE Sensitive     IMIPENEM <=0.25 SENSITIVE Sensitive     NITROFURANTOIN <=16 SENSITIVE Sensitive     TRIMETH/SULFA <=20 SENSITIVE Sensitive     AMPICILLIN/SULBACTAM <=2 SENSITIVE Sensitive     PIP/TAZO <=4 SENSITIVE Sensitive     * >=100,000 COLONIES/mL ESCHERICHIA COLI  Culture, blood (routine x 2)     Status: Abnormal   Collection Time: 02/29/20  8:49 AM   Specimen: BLOOD RIGHT HAND  Result Value Ref Range Status   Specimen Description   Final    BLOOD RIGHT HAND Performed at [facility], [address] [place], [place] [address]    Special Requests   Final    BOTTLES DRAWN AEROBIC AND ANAEROBIC Blood Culture adequate volume Performed at [facility], [address], [place], [place] [address]    Culture  Setup Time   Final    AEROBIC BOTTLE ONLY GRAM NEGATIVE RODS Gram Stain Report Called to,Read Back By and Verified With: A AMBURN,RN@0245  03/01/2020 [facility] Performed at [facility], [address] [place], [place] [address]    Culture (A)  Final    ESCHERICHIA COLI SUSCEPTIBILITIES PERFORMED ON PREVIOUS CULTURE WITHIN THE LAST 5 DAYS. Performed at [facility], [place] [address] [place], [place] [address]    Report Status 03/03/2020 FINAL   Final  Culture, blood (routine x 2)     Status: Abnormal   Collection Time: 02/29/20  8:49 AM   Specimen: Right Antecubital; Blood  Result Value Ref Range Status   Specimen Description   Final    RIGHT ANTECUBITAL Performed at [facility], [address], [place], [place] [address]    Special Requests   Final    BOTTLES DRAWN AEROBIC AND ANAEROBIC Blood Culture adequate volume Performed at [facility], [address], [place], [place] [address]    Culture  Setup Time   Final    AEROBIC BOTTLE ONLY GRAM NEGATIVE RODS Gram Stain Report Called to,Read Back By and Verified With: A AMBURN,RN @0355  03/01/20 [facility] CRITICAL RESULT CALLED TO, READ BACK BY AND VERIFIED WITH: PHARMD G COFFEE [id number] AT 830 AM BY CM Performed at [facility], [place] [address], [place], [place] [address]    Culture ESCHERICHIA COLI (A)  Final   Report Status 03/03/2020 FINAL  Final   Organism ID, Bacteria ESCHERICHIA COLI  Final      Susceptibility   Escherichia coli - MIC*    AMPICILLIN <=2 SENSITIVE Sensitive     CEFAZOLIN <=4 SENSITIVE Sensitive     CEFEPIME <=0.12 SENSITIVE Sensitive     CEFTAZIDIME <=1 SENSITIVE Sensitive     CEFTRIAXONE <=0.25 SENSITIVE Sensitive     CIPROFLOXACIN <=0.25 SENSITIVE Sensitive     GENTAMICIN <=1 SENSITIVE Sensitive     IMIPENEM <=0.25 SENSITIVE Sensitive     TRIMETH/SULFA <=20 SENSITIVE Sensitive     AMPICILLIN/SULBACTAM <=2 SENSITIVE Sensitive     PIP/TAZO <=4 SENSITIVE Sensitive     * ESCHERICHIA COLI  Blood Culture ID Panel (Reflexed)     Status: Abnormal   Collection Time: 02/29/20  8:49 AM  Result Value Ref Range Status   Enterococcus species NOT DETECTED NOT DETECTED Final   Listeria monocytogenes NOT DETECTED NOT DETECTED Final   Staphylococcus species NOT DETECTED NOT DETECTED Final   Staphylococcus aureus (BCID) NOT DETECTED NOT DETECTED Final  Streptococcus species NOT DETECTED NOT DETECTED Final   Streptococcus agalactiae NOT DETECTED NOT DETECTED  Final   Streptococcus pneumoniae NOT DETECTED NOT DETECTED Final   Streptococcus pyogenes NOT DETECTED NOT DETECTED Final   Acinetobacter baumannii NOT DETECTED NOT DETECTED Final   Enterobacteriaceae species DETECTED (A) NOT DETECTED Final    Comment: Enterobacteriaceae represent a large family of gram-negative bacteria, not a single organism. CRITICAL RESULT CALLED TO, READ BACK BY AND VERIFIED WITH: PHARMD G COFFEE [id number] AT 470 AM BY CM    Enterobacter cloacae complex NOT DETECTED NOT DETECTED Final   Escherichia coli DETECTED (A) NOT DETECTED Final    Comment: CRITICAL RESULT CALLED TO, READ BACK BY AND VERIFIED WITH: PHARMD G COFFEE [id number] AT 962 AM BY CM    Klebsiella oxytoca NOT DETECTED NOT DETECTED Final   Klebsiella pneumoniae NOT DETECTED NOT DETECTED Final   Proteus species NOT DETECTED NOT DETECTED Final   Serratia marcescens NOT DETECTED NOT DETECTED Final   Carbapenem resistance NOT DETECTED NOT DETECTED Final   Haemophilus influenzae NOT DETECTED NOT DETECTED Final   Neisseria meningitidis NOT DETECTED NOT DETECTED Final   Pseudomonas aeruginosa NOT DETECTED NOT DETECTED Final   Candida albicans NOT DETECTED NOT DETECTED Final   Candida glabrata NOT DETECTED NOT DETECTED Final   Candida krusei NOT DETECTED NOT DETECTED Final   Candida parapsilosis NOT DETECTED NOT DETECTED Final   Candida tropicalis NOT DETECTED NOT DETECTED Final    Comment: Performed at [facility], [place] [address], [place], [place] [address]  Respiratory Panel by RT PCR (Flu A&B, Covid) - Nasopharyngeal Swab     Status: None   Collection Time: 02/29/20  9:13 AM   Specimen: Nasopharyngeal Swab  Result Value Ref Range Status   SARS Coronavirus 2 by RT PCR NEGATIVE NEGATIVE Final    Comment: (NOTE) SARS-CoV-2 target nucleic acids are NOT DETECTED. The SARS-CoV-2 RNA is generally detectable in upper respiratoy specimens during the acute phase of infection. The lowest concentration of  SARS-CoV-2 viral copies this assay can detect is 131 copies/mL. A negative result does not preclude SARS-Cov-2 infection and should not be used as the sole basis for treatment or other patient management decisions. A negative result may occur with  improper specimen collection/handling, submission of specimen other than nasopharyngeal swab, presence of viral mutation(s) within the areas targeted by this assay, and inadequate number of viral copies (<131 copies/mL). A negative result must be combined with clinical observations, patient history, and epidemiological information. The expected result is Negative. Fact Sheet for Patients:  PinkCheek.be Fact Sheet for Healthcare Providers:  GravelBags.it This test is not yet ap proved or cleared by the [place] FDA and  has been authorized for detection and/or diagnosis of SARS-CoV-2 by FDA under an Emergency Use Authorization (EUA). This EUA will remain  in effect (meaning this test can be used) for the duration of the COVID-19 declaration under Section 564(b)(1) of the Act, 21 U.S.C. section 360bbb-3(b)(1), unless the authorization is terminated or revoked sooner.    Influenza A by PCR NEGATIVE NEGATIVE Final   Influenza B by PCR NEGATIVE NEGATIVE Final    Comment: (NOTE) The Xpert Xpress SARS-CoV-2/FLU/RSV assay is intended as an aid in  the diagnosis of influenza from Nasopharyngeal swab specimens and  should not be used as a sole basis for treatment. Nasal washings and  aspirates are unacceptable for Xpert Xpress SARS-CoV-2/FLU/RSV  testing. Fact Sheet for Patients: PinkCheek.be Fact Sheet for Healthcare Providers: GravelBags.it  This test is not yet approved or cleared by the [place] and  has been authorized for detection and/or diagnosis of SARS-CoV-2 by  FDA under an Emergency Use Authorization (EUA).  This EUA will remain  in effect (meaning this test can be used) for the duration of the  Covid-19 declaration under Section 564(b)(1) of the Act, 21  U.S.C. section 360bbb-3(b)(1), unless the authorization is  terminated or revoked. Performed at [facility], [address] [place], [place] [address]   MRSA PCR Screening     Status: None   Collection Time: 02/29/20  3:18 PM   Specimen: Nasal Mucosa; Nasopharyngeal  Result Value Ref Range Status   MRSA by PCR NEGATIVE NEGATIVE Final    Comment:        The GeneXpert MRSA Assay (FDA approved for NASAL specimens only), is one component of a comprehensive MRSA colonization surveillance program. It is not intended to diagnose MRSA infection nor to guide or monitor treatment for MRSA infections. Performed at [facility], [address], [place], [place] [address]          Radiology Studies: No results found.      Scheduled Meds: . Chlorhexidine Gluconate Cloth  6 each Topical Daily  . citalopram  20 mg Oral Daily  . cyanocobalamin  1,000 mcg Subcutaneous Q30 days  . cycloSPORINE  1 drop Both Eyes BID  . levETIRAcetam  500 mg Oral BID  . LORazepam  0-4 mg Intravenous Q8H  . metoprolol tartrate  5 mg Intravenous Q6H  . multivitamin with minerals  1 tablet Oral Daily  . thiamine  100 mg Oral Daily   Continuous Infusions: .  ceFAZolin (ANCEF) IV Stopped (03/04/20 1434)     LOS: 4 days    Time spent: 35mins    Kathie Dike, MD [facility]   If 7PM-7AM, please contact night-coverage [email or web address]  03/04/2020, 8:14 PM

## 2020-03-05 DIAGNOSIS — N390 Urinary tract infection, site not specified: Secondary | ICD-10-CM

## 2020-03-05 HISTORY — DX: Urinary tract infection, site not specified: N39.0

## 2020-03-05 LAB — CBC
HCT: 29.1 % — ABNORMAL LOW (ref 36.0–46.0)
Hemoglobin: 9.1 g/dL — ABNORMAL LOW (ref 12.0–15.0)
MCH: 27.3 pg (ref 26.0–34.0)
MCHC: 31.3 g/dL (ref 30.0–36.0)
MCV: 87.4 fL (ref 80.0–100.0)
Platelets: 59 10*3/uL — ABNORMAL LOW (ref 150–400)
RBC: 3.33 MIL/uL — ABNORMAL LOW (ref 3.87–5.11)
RDW: 16.4 % — ABNORMAL HIGH (ref 11.5–15.5)
WBC: 9.3 10*3/uL (ref 4.0–10.5)
nRBC: 0 % (ref 0.0–0.2)

## 2020-03-05 LAB — BASIC METABOLIC PANEL
Anion gap: 8 (ref 5–15)
BUN: 10 mg/dL (ref 8–23)
CO2: 23 mmol/L (ref 22–32)
Calcium: 7.4 mg/dL — ABNORMAL LOW (ref 8.9–10.3)
Chloride: 107 mmol/L (ref 98–111)
Creatinine, Ser: 0.71 mg/dL (ref 0.44–1.00)
GFR calc Af Amer: >60 mL/min (ref >60)
GFR calc non Af Amer: >60 mL/min (ref >60)
Glucose, Bld: 88 mg/dL (ref 70–99)
Potassium: 3.8 mmol/L (ref 3.5–5.1)
Sodium: 138 mmol/L (ref 135–145)

## 2020-03-05 MED ORDER — CEPHALEXIN 500 MG PO CAPS: 500.0000 mg | ORAL_CAPSULE | Freq: Four times a day (QID) | ORAL | 0 refills | Status: DC

## 2020-03-05 MED ORDER — METOPROLOL TARTRATE 25 MG PO TABS: 25.0000 mg | ORAL_TABLET | Freq: Two times a day (BID) | ORAL | 11 refills | Status: DC

## 2020-03-05 MED ORDER — CEPHALEXIN 250 MG PO CAPS: 500.0000 mg | ORAL_CAPSULE | Freq: Four times a day (QID) | ORAL | 0 refills | Status: DC

## 2020-03-05 NOTE — Care Management Important Message (Signed)
Important Message  Patient Details  Name: [name] MRN: [id number] Date of Birth: 1941/02/27   Medicare Important Message Given:  Yes     Tommy Medal 03/05/2020, 12:24 PM

## 2020-03-05 NOTE — Progress Notes (Deleted)
Physician Discharge Summary  CECILE GILLISPIE ZOX:[id number] DOB: 1941/09/06 DOA: 02/29/2020  PCP: Asencion Noble, MD  Admit date: 02/29/2020 Discharge date: 03/05/2020  Admitted From: home Disposition:  home  Recommendations for Outpatient Follow-up:  1. Follow up with PCP in 1-2 weeks 2. Please obtain BMP/CBC in one week 3. Consider further work up for iron deficiency anemia 4. Patient will be scheduled for follow up with urology  Home Health:home health PT, patient has 24hr caregiver at home Equipment/Devices:  Discharge Condition:stable CODE STATUS:full code Diet recommendation: heart healthy  Brief/Interim Summary: 79 year old female with a history of prior kidney stones, admitted to the hospital with abdominal pain.  She was found to have an obstructed renal stone and evidence of UTI/sepsis.  Blood cultures positive for E. coli.  She was seen by urology and underwent bilateral stent placement.  Discharge Diagnoses:  Active Problems:   Right ureteral stone   Seizures (HCC)   Sepsis (HCC)   Acute lower UTI  1. E. coli sepsis, secondary to urinary tract infection from obstructed ureteral stone.   Blood cultures positive for E. coli.  Transition meropenem to cefazolin based on culture sensitivities.  Hemodynamics have improved.  Lactic acid trended down to normal. WBC trended down to normal. He will be transitioned to oral keflex to complete his antibiotic course 2. 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 Seen by urology, status post balloon dilation of UPJ stricture andbilateral JJ stent placement. Follow up with urology as an outpatient 3. Acute kidney injury.  Likely related to sepsis.  Improved with IV hydration 4. Non-anion gap metabolic acidosis related to hyperchloremia.  Resolved with bicarbonate infusion 5. Urinary tract infection.   Urine culture positive for E. coli.  Meropenem changed to cefazolin based on culture sensitivities 6. Seizures. Continue on  Keppra. 7. Thrombocytopenia.  Likely related to DIC/sepsis. Schistocytes noted on smear, but LDH normal and bilirubin normal. No signs of bleeding at this time.  Platelets now trending up.  Continue to follow 8. Anemia.  No signs of bleeding.  Anemia panel indicates iron deficiency, although likely has a component of critical illness. 9. Hypokalemia.  Replace  Discharge Instructions  Discharge Instructions    Diet - low sodium heart healthy   Complete by: As directed    Increase activity slowly   Complete by: As directed      Allergies as of 03/05/2020      Reactions   Clindamycin Rash   Doxycycline Hyclate Rash      Medication List    STOP taking these medications   predniSONE 20 MG tablet Commonly known as: DELTASONE     TAKE these medications   Biotin 2.5 MG Tabs Take 1 tablet by mouth daily.   cephALEXin 500 MG capsule Commonly known as: KEFLEX Take 1 capsule (500 mg total) by mouth 4 (four) times daily.   cyanocobalamin 1000 MCG/ML injection Commonly known as: (VITAMIN B-12) Inject 1,000 mcg into the skin every 30 (thirty) days.   cycloSPORINE 0.05 % ophthalmic emulsion Commonly known as: RESTASIS Place 1 drop into both eyes 2 (two) times daily.   denosumab 60 MG/ML Sosy injection Commonly known as: PROLIA Inject 60 mg into the skin every 6 (six) months.   HYDROcodone-acetaminophen 5-325 MG tablet Commonly known as: NORCO/VICODIN Take 1 tablet by mouth every 4 (four) hours as needed.   levETIRAcetam 500 MG tablet Commonly known as: KEPPRA Take 1 tablet (500 mg total) by mouth 2 (two) times daily.   LEXAPRO PO  Take 10 mg by mouth daily.   metoprolol tartrate 25 MG tablet Commonly known as: LOPRESSOR Take 1 tablet (25 mg total) by mouth 2 (two) times daily.   multivitamin capsule Take 1 capsule by mouth daily.   ondansetron 4 MG disintegrating tablet Commonly known as: Zofran ODT Take 1 tablet (4 mg total) by mouth every 8 (eight) hours as needed  for nausea or vomiting.   potassium citrate 10 MEQ (1080 MG) SR tablet Commonly known as: UROCIT-K Take 10 mEq by mouth daily.   thiamine 100 MG tablet Take 100 mg by mouth daily.      Follow-up Information    Asencion Noble, MD. Schedule an appointment as soon as possible for a visit in 2 days.   Specialty: Internal Medicine Contact information: [address] [place] [address] [place]. Schedule an appointment as soon as possible for a visit in 2 days.   Specialty: Urology Contact information: [address] [place] [address] [phone]         Allergies  Allergen Reactions  . Clindamycin Rash  . Doxycycline Hyclate Rash    Consultations:  urology   Procedures/Studies: CT Head Wo Contrast  Result Date: 02/29/2020 CLINICAL DATA:  79 year old female with history of encephalopathy. Altered mental status after being given pain medication. EXAM: CT HEAD WITHOUT CONTRAST TECHNIQUE: Contiguous axial images were obtained from the base of the skull through the vertex without intravenous contrast. COMPARISON:  Head CT 12/29/2019. FINDINGS: Brain: Mild cerebral atrophy. Patchy and confluent areas of decreased attenuation are noted throughout the deep and periventricular white matter of the cerebral hemispheres bilaterally, compatible with chronic microvascular ischemic disease. No evidence of acute infarction, hemorrhage, hydrocephalus, extra-axial collection or mass lesion/mass effect. Vascular: Diffuse dural enhancement and high attenuation in the dural venous sinuses related to recent contrast enhanced CT the abdomen and pelvis from earlier today. No hyperdense vessel or unexpected calcification. Skull: Normal. Negative for fracture or focal lesion. Sinuses/Orbits: No acute finding. Other: None. IMPRESSION: 1. No acute intracranial abnormalities. 2. Mild cerebral atrophy with chronic microvascular  ischemic changes in the cerebral white matter, as above. Electronically Signed   By: Vinnie Langton M.D.   On: 02/29/2020 08:38   CT ABDOMEN PELVIS W CONTRAST  Result Date: 02/29/2020 CLINICAL DATA:  Lower abdominal pain with nausea and vomiting EXAM: CT ABDOMEN AND PELVIS WITH CONTRAST TECHNIQUE: Multidetector CT imaging of the abdomen and pelvis was performed using the standard protocol following bolus administration of intravenous contrast. CONTRAST:  51mL OMNIPAQUE IOHEXOL 300 MG/ML  SOLN COMPARISON:  07/18/2016 FINDINGS: Lower chest:  Mild atelectasis at the bases. Hepatobiliary: No focal liver abnormality.Limited mural calcification of the posterior wall gallbladder, not significantly progressed. No acute biliary inflammation or ductal dilatation. Pancreas: Unremarkable. Spleen: Unremarkable. Adrenals/Urinary Tract: Negative adrenals. Right hydroureteronephrosis and delayed excretion with avid urothelial enhancement related to a triangular stone the ureter just above the UVJ, 6 x 7 mm. Associated asymmetric right perinephric stranding is also attributed to the obstruction. There are lower pole calculi measuring up to 3 mm. Atrophic left kidney with diffuse cortical thinning and a dominant cyst that is simple. There are 3 dominant calculi measuring up to 9 mm. Prominent left upper urothelial enhancement but no hydronephrosis or ureteral obstructing stone. Unremarkable bladder. Stomach/Bowel: No obstruction. No evidence of bowel inflammation. Enterocolonic anastomosis in the right lower quadrant. Vascular/Lymphatic: No acute vascular abnormality. Scattered atherosclerotic calcification  of the aorta. No mass or adenopathy. Reproductive:Hysterectomy. Other: No ascites or pneumoperitoneum. Small umbilical hernia containing fat and transverse colon. Musculoskeletal: No acute abnormalities. Advanced lumbar spine degeneration with scoliosis. Sclerosis of the lower sacrum attributed to nonacute insufficiency  fracture. IMPRESSION: 1. Obstructing 7 mm stone in the distal right ureter, just above the UVJ. 2. Bilateral nephrolithiasis and atrophic left kidney. 3. Limited mural calcification of the gallbladder. 4. Small umbilical hernia containing transverse colon. 5. Additional incidental findings noted above. Electronically Signed   By: Monte Fantasia M.D.   On: 02/29/2020 05:53   DG CHEST PORT 1 VIEW  Result Date: 03/02/2020 CLINICAL DATA:  Shortness of breath. EXAM: PORTABLE CHEST 1 VIEW COMPARISON:  Chest radiograph 12/18/2019 FINDINGS: Heart size within normal limits. There are hazy gradient opacities at the bilateral lung bases with blunting of the lateral costophrenic angles. Pulmonary vascular congestion. No evidence of pneumothorax. No acute bony abnormality. Partially imaged left nephroureteral stent. Redemonstrated left renal calculi. Thoracolumbar dextrocurvature. No evidence of acute bony abnormality. IMPRESSION: Hazy gradient opacities at the bilateral lung bases with blunting of the lateral costophrenic angles. Findings likely reflect bilateral pleural effusions with bibasilar atelectasis. Pneumonia at the lung bases cannot be excluded. Pulmonary vascular congestion. Redemonstrated left renal calculi with partially imaged left nephroureteral stent. Electronically Signed   By: Kellie Simmering DO   On: 03/02/2020 10:08   DG Abdomen Acute W/Chest  Result Date: 02/29/2020 CLINICAL DATA:  Lower abdominal pain and nausea and vomiting EXAM: DG ABDOMEN ACUTE W/ 1V CHEST COMPARISON:  12/18/2019, 08/01/2011 FINDINGS: Cardiac shadow is within normal limits. Aortic calcifications are seen. The lungs are well aerated bilaterally. No focal infiltrate or sizable effusion is seen. Old rib deformities are again seen. Scoliosis is again noted stable in appearance. Scattered large and small bowel gas is noted. No free air is seen. Degenerative changes of lumbar spine are again noted. Scattered calcifications are noted in  the left mid abdomen consistent with nonobstructing renal calculi. IMPRESSION: Nonobstructing left renal stones. No acute abnormality noted.  No free air is seen. Electronically Signed   By: Inez Catalina M.D.   On: 02/29/2020 03:47   DG C-Arm 1-60 Min-No Report  Result Date: 02/29/2020 Fluoroscopy was utilized by the requesting physician.  No radiographic interpretation.       Subjective: Patient feels weak and tired. No shortness of breath. No abdominal or flank pain  Discharge Exam: Vitals:   03/04/20 2149 03/05/20 0440 03/05/20 1116 03/05/20 1316  BP: (!) 145/86 (!) 169/92 (!) 153/81 (!) 142/81  Pulse: 84 91 79 82  Resp: 20 18 18 18   Temp: 99.3 F (37.4 C) 98 F (36.7 C) 98.8 F (37.1 C) 98.7 F (37.1 C)  TempSrc: Oral  Oral Oral  SpO2: 93% 90% 92% 92%  Weight:      Height:        General: Pt is alert, awake, not in acute distress Cardiovascular: RRR, S1/S2 +, no rubs, no gallops Respiratory: CTA bilaterally, no wheezing, no rhonchi Abdominal: Soft, NT, ND, bowel sounds + Extremities: no edema, no cyanosis    The results of significant diagnostics from this hospitalization (including imaging, microbiology, ancillary and laboratory) are listed below for reference.     Microbiology: Recent Results (from the past 240 hour(s))  Urine Culture     Status: Abnormal   Collection Time: 02/29/20  2:20 AM   Specimen: Urine, Clean Catch  Result Value Ref Range Status   Specimen Description   Final  URINE, CLEAN CATCH Performed at [facility], [address], [place], [place] [address]    Special Requests   Final    NONE Performed at [facility], [address], [place], [place] [address]    Culture >=100,000 COLONIES/mL ESCHERICHIA COLI (A)  Final   Report Status 03/02/2020 FINAL  Final   Organism ID, Bacteria ESCHERICHIA COLI (A)  Final      Susceptibility   Escherichia coli - MIC*    AMPICILLIN <=2 SENSITIVE Sensitive     CEFAZOLIN <=4 SENSITIVE Sensitive      CEFTRIAXONE <=0.25 SENSITIVE Sensitive     CIPROFLOXACIN <=0.25 SENSITIVE Sensitive     GENTAMICIN <=1 SENSITIVE Sensitive     IMIPENEM <=0.25 SENSITIVE Sensitive     NITROFURANTOIN <=16 SENSITIVE Sensitive     TRIMETH/SULFA <=20 SENSITIVE Sensitive     AMPICILLIN/SULBACTAM <=2 SENSITIVE Sensitive     PIP/TAZO <=4 SENSITIVE Sensitive     * >=100,000 COLONIES/mL ESCHERICHIA COLI  Culture, blood (routine x 2)     Status: Abnormal   Collection Time: 02/29/20  8:49 AM   Specimen: BLOOD RIGHT HAND  Result Value Ref Range Status   Specimen Description   Final    BLOOD RIGHT HAND Performed at [facility], [address], [place], [place] [address]    Special Requests   Final    BOTTLES DRAWN AEROBIC AND ANAEROBIC Blood Culture adequate volume Performed at [facility], [address] [place], [place] [address]    Culture  Setup Time   Final    AEROBIC BOTTLE ONLY GRAM NEGATIVE RODS Gram Stain Report Called to,Read Back By and Verified With: A AMBURN,RN@0245  03/01/2020 [facility] Performed at [facility], [address], [place], [place] [address]    Culture (A)  Final    ESCHERICHIA COLI SUSCEPTIBILITIES PERFORMED ON PREVIOUS CULTURE WITHIN THE LAST 5 DAYS. Performed at [facility], [place] [address] [place], [place] [address]    Report Status 03/03/2020 FINAL  Final  Culture, blood (routine x 2)     Status: Abnormal   Collection Time: 02/29/20  8:49 AM   Specimen: Right Antecubital; Blood  Result Value Ref Range Status   Specimen Description   Final    RIGHT ANTECUBITAL Performed at [facility], [address], [place], [place] [address]    Special Requests   Final    BOTTLES DRAWN AEROBIC AND ANAEROBIC Blood Culture adequate volume Performed at [facility], [address], [place], [place] [address]    Culture  Setup Time   Final    AEROBIC BOTTLE ONLY GRAM NEGATIVE RODS Gram Stain Report Called to,Read Back By and Verified With: A AMBURN,RN @0355  03/01/20 [facility]IT[facility]  RESULT CALLED TO, READ BACK BY AND VERIFIED WITH: PHARMD G COFFEE [id number] AT 830 AM BY CM Performed at [facility], [place] [address], [place], [place] [address]    Culture ESCHERICHIA COLI (A)  Final   Report Status 03/03/2020 FINAL  Final   Organism ID, Bacteria ESCHERICHIA COLI  Final      Susceptibility   Escherichia coli - MIC*    AMPICILLIN <=2 SENSITIVE Sensitive     CEFAZOLIN <=4 SENSITIVE Sensitive     CEFEPIME <=0.12 SENSITIVE Sensitive     CEFTAZIDIME <=1 SENSITIVE Sensitive     CEFTRIAXONE <=0.25 SENSITIVE Sensitive     CIPROFLOXACIN <=0.25 SENSITIVE Sensitive     GENTAMICIN <=1 SENSITIVE Sensitive     IMIPENEM <=0.25 SENSITIVE Sensitive  TRIMETH/SULFA <=20 SENSITIVE Sensitive     AMPICILLIN/SULBACTAM <=2 SENSITIVE Sensitive     PIP/TAZO <=4 SENSITIVE Sensitive     * ESCHERICHIA COLI  Blood Culture ID Panel (Reflexed)     Status: Abnormal   Collection Time: 02/29/20  8:49 AM  Result Value Ref Range Status   Enterococcus species NOT DETECTED NOT DETECTED Final   Listeria monocytogenes NOT DETECTED NOT DETECTED Final   Staphylococcus species NOT DETECTED NOT DETECTED Final   Staphylococcus aureus (BCID) NOT DETECTED NOT DETECTED Final   Streptococcus species NOT DETECTED NOT DETECTED Final   Streptococcus agalactiae NOT DETECTED NOT DETECTED Final   Streptococcus pneumoniae NOT DETECTED NOT DETECTED Final   Streptococcus pyogenes NOT DETECTED NOT DETECTED Final   Acinetobacter baumannii NOT DETECTED NOT DETECTED Final   Enterobacteriaceae species DETECTED (A) NOT DETECTED Final    Comment: Enterobacteriaceae represent a large family of gram-negative bacteria, not a single organism. CRITICAL RESULT CALLED TO, READ BACK BY AND VERIFIED WITH: PHARMD G COFFEE [id number] AT 601 AM BY CM    Enterobacter cloacae complex NOT DETECTED NOT DETECTED Final   Escherichia coli DETECTED (A) NOT DETECTED Final    Comment: CRITICAL RESULT CALLED TO, READ BACK BY AND VERIFIED  WITH: PHARMD G COFFEE [id number] AT 093 AM BY CM    Klebsiella oxytoca NOT DETECTED NOT DETECTED Final   Klebsiella pneumoniae NOT DETECTED NOT DETECTED Final   Proteus species NOT DETECTED NOT DETECTED Final   Serratia marcescens NOT DETECTED NOT DETECTED Final   Carbapenem resistance NOT DETECTED NOT DETECTED Final   Haemophilus influenzae NOT DETECTED NOT DETECTED Final   Neisseria meningitidis NOT DETECTED NOT DETECTED Final   Pseudomonas aeruginosa NOT DETECTED NOT DETECTED Final   Candida albicans NOT DETECTED NOT DETECTED Final   Candida glabrata NOT DETECTED NOT DETECTED Final   Candida krusei NOT DETECTED NOT DETECTED Final   Candida parapsilosis NOT DETECTED NOT DETECTED Final   Candida tropicalis NOT DETECTED NOT DETECTED Final    Comment: Performed at [facility], [place] [address], [place], [place] [address]  Respiratory Panel by RT PCR (Flu A&B, Covid) - Nasopharyngeal Swab     Status: None   Collection Time: 02/29/20  9:13 AM   Specimen: Nasopharyngeal Swab  Result Value Ref Range Status   SARS Coronavirus 2 by RT PCR NEGATIVE NEGATIVE Final    Comment: (NOTE) SARS-CoV-2 target nucleic acids are NOT DETECTED. The SARS-CoV-2 RNA is generally detectable in upper respiratoy specimens during the acute phase of infection. The lowest concentration of SARS-CoV-2 viral copies this assay can detect is 131 copies/mL. A negative result does not preclude SARS-Cov-2 infection and should not be used as the sole basis for treatment or other patient management decisions. A negative result may occur with  improper specimen collection/handling, submission of specimen other than nasopharyngeal swab, presence of viral mutation(s) within the areas targeted by this assay, and inadequate number of viral copies (<131 copies/mL). A negative result must be combined with clinical observations, patient history, and epidemiological information. The expected result is Negative. Fact Sheet for  Patients:  PinkCheek.be Fact Sheet for Healthcare Providers:  GravelBags.it This test is not yet ap proved or cleared by the [place] FDA and  has been authorized for detection and/or diagnosis of SARS-CoV-2 by FDA under an Emergency Use Authorization (EUA). This EUA will remain  in effect (meaning this test can be used) for the duration of the COVID-19 declaration under Section 564(b)(1) of the Act,  21 U.S.C. section 360bbb-3(b)(1), unless the authorization is terminated or revoked sooner.    Influenza A by PCR NEGATIVE NEGATIVE Final   Influenza B by PCR NEGATIVE NEGATIVE Final    Comment: (NOTE) The Xpert Xpress SARS-CoV-2/FLU/RSV assay is intended as an aid in  the diagnosis of influenza from Nasopharyngeal swab specimens and  should not be used as a sole basis for treatment. Nasal washings and  aspirates are unacceptable for Xpert Xpress SARS-CoV-2/FLU/RSV  testing. Fact Sheet for Patients: PinkCheek.be Fact Sheet for Healthcare Providers: GravelBags.it This test is not yet approved or cleared by the [place] FDA and  has been authorized for detection and/or diagnosis of SARS-CoV-2 by  FDA under an Emergency Use Authorization (EUA). This EUA will remain  in effect (meaning this test can be used) for the duration of the  Covid-19 declaration under Section 564(b)(1) of the Act, 21  U.S.C. section 360bbb-3(b)(1), unless the authorization is  terminated or revoked. Performed at [facility], [address], [place], [place] [address]   MRSA PCR Screening     Status: None   Collection Time: 02/29/20  3:18 PM   Specimen: Nasal Mucosa; Nasopharyngeal  Result Value Ref Range Status   MRSA by PCR NEGATIVE NEGATIVE Final    Comment:        The GeneXpert MRSA Assay (FDA approved for NASAL specimens only), is one component of a comprehensive MRSA  colonization surveillance program. It is not intended to diagnose MRSA infection nor to guide or monitor treatment for MRSA infections. Performed at [facility], [address], [place], [place] [address]      Labs: BNP (last 3 results) No results for input(s): BNP in the last 8760 hours. Basic Metabolic Panel: Recent Labs  Lab 03/01/20 0421 03/02/20 0430 03/03/20 0447 03/03/20 0907 03/04/20 0626 03/05/20 0507  NA 138 141 142  --  139 138  K 4.3 3.8 3.2*  --  2.9* 3.8  CL 109 115* 108  --  106 107  CO2 20* 16* 24  --  25 23  GLUCOSE 67* 60* 134*  --  115* 88  BUN 26* 26* 16  --  11 10  CREATININE 1.42* 1.26* 0.89  --  0.68 0.71  CALCIUM 6.8* 6.6* 6.8*  --  7.0* 7.4*  MG  --   --   --  1.7  --   --    Liver Function Tests: Recent Labs  Lab 02/29/20 0251 03/01/20 0421 03/03/20 0447 03/04/20 0626  AST 27 19 18 16   ALT 16 12 10 8   ALKPHOS 77 36* 56 60  BILITOT 0.5 0.3 0.7 0.4  PROT 6.6 4.6* 4.4* 4.9*  ALBUMIN 3.8 2.3* 2.2* 2.2*   Recent Labs  Lab 02/29/20 0251  LIPASE 32   No results for input(s): AMMONIA in the last 168 hours. CBC: Recent Labs  Lab 02/29/20 0251 02/29/20 0251 03/01/20 0713 03/02/20 0430 03/03/20 0447 03/04/20 0626 03/05/20 0507  WBC 12.1*   < > 23.5* 20.0* 10.7* 9.1 9.3  NEUTROABS 10.9*  --   --   --   --   --   --   HGB 10.9*   < > 9.9* 8.8* 8.2* 8.3* 9.1*  HCT 35.1*   < > 32.0* 28.7* 25.8* 26.3* 29.1*  MCV 90.5   < > 92.8 90.8 88.4 87.1 87.4  PLT 195   < > 77* 31* 25*  26* 32* 59*   < > = values in this interval not  displayed.   Cardiac Enzymes: No results for input(s): CKTOTAL, CKMB, CKMBINDEX, TROPONINI in the last 168 hours. BNP: Invalid input(s): POCBNP CBG: Recent Labs  Lab 03/01/20 0657 03/02/20 0727 03/02/20 0754  GLUCAP 84 53* 83   D-Dimer Recent Labs    03/03/20 0447  DDIMER 5.40*   Hgb A1c No results for input(s): HGBA1C in the last 72 hours. Lipid Profile No results for input(s): CHOL, HDL, LDLCALC,  TRIG, CHOLHDL, LDLDIRECT in the last 72 hours. Thyroid function studies No results for input(s): TSH, T4TOTAL, T3FREE, THYROIDAB in the last 72 hours.  Invalid input(s): FREET3 Anemia work up Recent Labs    03/03/20 0447  VITAMINB12 3,123*  FOLATE 9.4  FERRITIN 50  TIBC 223*  IRON 7*  RETICCTPCT 0.8   Urinalysis    Component Value Date/Time   COLORURINE COLORLESS (A) 02/29/2020 0309   APPEARANCEUR CLEAR 02/29/2020 0309   LABSPEC 1.004 (L) 02/29/2020 0309   PHURINE 8.0 02/29/2020 0309   GLUCOSEU NEGATIVE 02/29/2020 0309   HGBUR MODERATE (A) 02/29/2020 0309   BILIRUBINUR NEGATIVE 02/29/2020 0309   KETONESUR NEGATIVE 02/29/2020 0309   PROTEINUR NEGATIVE 02/29/2020 0309   UROBILINOGEN 0.2 09/08/2014 0300   NITRITE NEGATIVE 02/29/2020 0309   LEUKOCYTESUR MODERATE (A) 02/29/2020 0309   Sepsis Labs Invalid input(s): PROCALCITONIN,  WBC,  LACTICIDVEN Microbiology Recent Results (from the past 240 hour(s))  Urine Culture     Status: Abnormal   Collection Time: 02/29/20  2:20 AM   Specimen: Urine, Clean Catch  Result Value Ref Range Status   Specimen Description   Final    URINE, CLEAN CATCH Performed at [facility], [address], [place], [place] [address]    Special Requests   Final    NONE Performed at [facility], [address], [place], [place] [address]    Culture >=100,000 COLONIES/mL ESCHERICHIA COLI (A)  Final   Report Status 03/02/2020 FINAL  Final   Organism ID, Bacteria ESCHERICHIA COLI (A)  Final      Susceptibility   Escherichia coli - MIC*    AMPICILLIN <=2 SENSITIVE Sensitive     CEFAZOLIN <=4 SENSITIVE Sensitive     CEFTRIAXONE <=0.25 SENSITIVE Sensitive     CIPROFLOXACIN <=0.25 SENSITIVE Sensitive     GENTAMICIN <=1 SENSITIVE Sensitive     IMIPENEM <=0.25 SENSITIVE Sensitive     NITROFURANTOIN <=16 SENSITIVE Sensitive     TRIMETH/SULFA <=20 SENSITIVE Sensitive     AMPICILLIN/SULBACTAM <=2 SENSITIVE Sensitive     PIP/TAZO <=4 SENSITIVE Sensitive      * >=100,000 COLONIES/mL ESCHERICHIA COLI  Culture, blood (routine x 2)     Status: Abnormal   Collection Time: 02/29/20  8:49 AM   Specimen: BLOOD RIGHT HAND  Result Value Ref Range Status   Specimen Description   Final    BLOOD RIGHT HAND Performed at [facility], [address], [place], [place] [address]    Special Requests   Final    BOTTLES DRAWN AEROBIC AND ANAEROBIC Blood Culture adequate volume Performed at [facility] [address] [place], [place] [address]    Culture  Setup Time   Final    AEROBIC BOTTLE ONLY GRAM NEGATIVE RODS Gram Stain Report Called to,Read Back By and Verified With: A AMBURN,RN@0245  03/01/2020 [facility] Performed at [facility], [address] [place], [place] [address]    Culture (A)  Final    ESCHERICHIA COLI SUSCEPTIBILITIES PERFORMED ON PREVIOUS CULTURE WITHIN THE LAST 5 DAYS. Performed at [facility],  [address], [place], [place] [address]    Report Status 03/03/2020 FINAL  Final  Culture, blood (routine x 2)     Status: Abnormal   Collection Time: 02/29/20  8:49 AM   Specimen: Right Antecubital; Blood  Result Value Ref Range Status   Specimen Description   Final    RIGHT ANTECUBITAL Performed at [facility], [address], [place], [place] [address]    Special Requests   Final    BOTTLES DRAWN AEROBIC AND ANAEROBIC Blood Culture adequate volume Performed at [facility], [address], [place], [place] [address]    Culture  Setup Time   Final    AEROBIC BOTTLE ONLY GRAM NEGATIVE RODS Gram Stain Report Called to,Read Back By and Verified With: A AMBURN,RN @0355  03/01/20 MKELLY CRITICAL RESULT CALLED TO, READ BACK BY AND VERIFIED WITH: PHARMD G COFFEE [id number] AT 830 AM BY CM Performed at [facility], [place] [address] [place], [place] [address]    Culture ESCHERICHIA COLI (A)  Final   Report Status 03/03/2020 FINAL  Final   Organism ID, Bacteria ESCHERICHIA COLI  Final      Susceptibility   Escherichia coli -  MIC*    AMPICILLIN <=2 SENSITIVE Sensitive     CEFAZOLIN <=4 SENSITIVE Sensitive     CEFEPIME <=0.12 SENSITIVE Sensitive     CEFTAZIDIME <=1 SENSITIVE Sensitive     CEFTRIAXONE <=0.25 SENSITIVE Sensitive     CIPROFLOXACIN <=0.25 SENSITIVE Sensitive     GENTAMICIN <=1 SENSITIVE Sensitive     IMIPENEM <=0.25 SENSITIVE Sensitive     TRIMETH/SULFA <=20 SENSITIVE Sensitive     AMPICILLIN/SULBACTAM <=2 SENSITIVE Sensitive     PIP/TAZO <=4 SENSITIVE Sensitive     * ESCHERICHIA COLI  Blood Culture ID Panel (Reflexed)     Status: Abnormal   Collection Time: 02/29/20  8:49 AM  Result Value Ref Range Status   Enterococcus species NOT DETECTED NOT DETECTED Final   Listeria monocytogenes NOT DETECTED NOT DETECTED Final   Staphylococcus species NOT DETECTED NOT DETECTED Final   Staphylococcus aureus (BCID) NOT DETECTED NOT DETECTED Final   Streptococcus species NOT DETECTED NOT DETECTED Final   Streptococcus agalactiae NOT DETECTED NOT DETECTED Final   Streptococcus pneumoniae NOT DETECTED NOT DETECTED Final   Streptococcus pyogenes NOT DETECTED NOT DETECTED Final   Acinetobacter baumannii NOT DETECTED NOT DETECTED Final   Enterobacteriaceae species DETECTED (A) NOT DETECTED Final    Comment: Enterobacteriaceae represent a large family of gram-negative bacteria, not a single organism. CRITICAL RESULT CALLED TO, READ BACK BY AND VERIFIED WITH: PHARMD G COFFEE [id number] AT 735 AM BY CM    Enterobacter cloacae complex NOT DETECTED NOT DETECTED Final   Escherichia coli DETECTED (A) NOT DETECTED Final    Comment: CRITICAL RESULT CALLED TO, READ BACK BY AND VERIFIED WITH: PHARMD G COFFEE [id number] AT 329 AM BY CM    Klebsiella oxytoca NOT DETECTED NOT DETECTED Final   Klebsiella pneumoniae NOT DETECTED NOT DETECTED Final   Proteus species NOT DETECTED NOT DETECTED Final   Serratia marcescens NOT DETECTED NOT DETECTED Final   Carbapenem resistance NOT DETECTED NOT DETECTED Final   Haemophilus influenzae  NOT DETECTED NOT DETECTED Final   Neisseria meningitidis NOT DETECTED NOT DETECTED Final   Pseudomonas aeruginosa NOT DETECTED NOT DETECTED Final   Candida albicans NOT DETECTED NOT DETECTED Final   Candida glabrata NOT DETECTED NOT DETECTED Final   Candida krusei NOT DETECTED NOT DETECTED Final   Candida  parapsilosis NOT DETECTED NOT DETECTED Final   Candida tropicalis NOT DETECTED NOT DETECTED Final    Comment: Performed at [facility], [place] [address] [place], [place] [address]  Respiratory Panel by RT PCR (Flu A&B, Covid) - Nasopharyngeal Swab     Status: None   Collection Time: 02/29/20  9:13 AM   Specimen: Nasopharyngeal Swab  Result Value Ref Range Status   SARS Coronavirus 2 by RT PCR NEGATIVE NEGATIVE Final    Comment: (NOTE) SARS-CoV-2 target nucleic acids are NOT DETECTED. The SARS-CoV-2 RNA is generally detectable in upper respiratoy specimens during the acute phase of infection. The lowest concentration of SARS-CoV-2 viral copies this assay can detect is 131 copies/mL. A negative result does not preclude SARS-Cov-2 infection and should not be used as the sole basis for treatment or other patient management decisions. A negative result may occur with  improper specimen collection/handling, submission of specimen other than nasopharyngeal swab, presence of viral mutation(s) within the areas targeted by this assay, and inadequate number of viral copies (<131 copies/mL). A negative result must be combined with clinical observations, patient history, and epidemiological information. The expected result is Negative. Fact Sheet for Patients:  PinkCheek.be Fact Sheet for Healthcare Providers:  GravelBags.it This test is not yet ap proved or cleared by the [place] FDA and  has been authorized for detection and/or diagnosis of SARS-CoV-2 by FDA under an Emergency Use Authorization (EUA). This EUA will remain   in effect (meaning this test can be used) for the duration of the COVID-19 declaration under Section 564(b)(1) of the Act, 21 U.S.C. section 360bbb-3(b)(1), unless the authorization is terminated or revoked sooner.    Influenza A by PCR NEGATIVE NEGATIVE Final   Influenza B by PCR NEGATIVE NEGATIVE Final    Comment: (NOTE) The Xpert Xpress SARS-CoV-2/FLU/RSV assay is intended as an aid in  the diagnosis of influenza from Nasopharyngeal swab specimens and  should not be used as a sole basis for treatment. Nasal washings and  aspirates are unacceptable for Xpert Xpress SARS-CoV-2/FLU/RSV  testing. Fact Sheet for Patients: PinkCheek.be Fact Sheet for Healthcare Providers: GravelBags.it This test is not yet approved or cleared by the [place] FDA and  has been authorized for detection and/or diagnosis of SARS-CoV-2 by  FDA under an Emergency Use Authorization (EUA). This EUA will remain  in effect (meaning this test can be used) for the duration of the  Covid-19 declaration under Section 564(b)(1) of the Act, 21  U.S.C. section 360bbb-3(b)(1), unless the authorization is  terminated or revoked. Performed at [facility], [address], [place], [place] [address]   MRSA PCR Screening     Status: None   Collection Time: 02/29/20  3:18 PM   Specimen: Nasal Mucosa; Nasopharyngeal  Result Value Ref Range Status   MRSA by PCR NEGATIVE NEGATIVE Final    Comment:        The GeneXpert MRSA Assay (FDA approved for NASAL specimens only), is one component of a comprehensive MRSA colonization surveillance program. It is not intended to diagnose MRSA infection nor to guide or monitor treatment for MRSA infections. Performed at [facility], [address] [place], [place] [address]      Time coordinating discharge: 5mins  SIGNED:   Kathie Dike, MD  [facility] 03/05/2020, 11:19 PM   If 7PM-7AM, please  contact night-coverage [email or web address]

## 2020-03-05 NOTE — Plan of Care (Signed)
  Problem: Acute Rehab PT Goals(only PT should resolve) Goal: Pt Will Go Supine/Side To Sit Outcome: Progressing Flowsheets (Taken 03/05/2020 1015) Pt will go Supine/Side to Sit:  with modified independence  with supervision Goal: Patient Will Transfer Sit To/From Stand Outcome: Progressing Flowsheets (Taken 03/05/2020 1015) Patient will transfer sit to/from stand:  with min guard assist  with minimal assist Goal: Pt Will Transfer Bed To Chair/Chair To Bed Outcome: Progressing Flowsheets (Taken 03/05/2020 1015) Pt will Transfer Bed to Chair/Chair to Bed:  min guard assist  with min assist Goal: Pt Will Ambulate Outcome: Progressing Flowsheets (Taken 03/05/2020 1015) Pt will Ambulate:  with min guard assist  with minimal assist  with rolling walker  50 feet   10:16 AM, 03/05/20 Lonell Grandchild, MPT Physical Therapist with [facility] 336 [phone] office [phone] mobile phone

## 2020-03-05 NOTE — Evaluation (Signed)
Clinical/Bedside Swallow Evaluation Patient Details  Name: [name] MRN: [id number] Date of Birth: 07/08/1941  Today's Date: 03/05/2020 Time: SLP Start Time (ACUTE ONLY): 0932 SLP Stop Time (ACUTE ONLY): 0949 SLP Time Calculation (min) (ACUTE ONLY): 17 min  Past Medical History:  Past Medical History:  Diagnosis Date  . Anxiety   . Arthritis   . At risk for seizures   . 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 Carpal tunnel syndrome, bilateral   . Depression   . GERD (gastroesophageal reflux disease)   . Kidney stones   . Liver disease   . Osteoporosis   . Subarachnoid bleed [facility])    Past Surgical History:  Past Surgical History:  Procedure Laterality Date  . ABDOMINAL HYSTERECTOMY     vaginal  . APPENDECTOMY    . CARPAL TUNNEL RELEASE  01/21/2012   Procedure: CARPAL TUNNEL RELEASE;  Surgeon: Wynonia Sours, MD;  Location: [facility];  Service: Orthopedics;  Laterality: Right;  . COLON RESECTION  2004   perf bowel after colonoscopy  . CYSTOSCOPY W/ URETERAL STENT PLACEMENT  2010   lt-lazer stone  . CYSTOSCOPY W/ URETERAL STENT PLACEMENT Bilateral 02/29/2020   Procedure: CYSTOSCOPY WITH BILATERAL RETROGRADE PYELOGRAM;BILATERAL URETERAL STENT PLACEMENT;  Surgeon: Cleon Gustin, MD;  Location: [facility];  Service: Urology;  Laterality: Bilateral;  . CYSTOSCOPY WITH STENT PLACEMENT Bilateral 09/08/2014   Procedure: CYSTOSCOPY WITH STENT PLACEMENT;  Surgeon: Malka So, MD;  Location: [facility];  Service: Urology;  Laterality: Bilateral;  . THORACOTOMY  2007   vatz-rt upper lobe  . TONSILLECTOMY     HPI:  [name] is a 79 y.o. female with medical history significant of kidney stones, presents to the hospital with complaints of Levaquin UTI which began at 10 PM on Monday night.  She had associated nausea with one episode of vomiting.  She denies any fever.  Her symptoms persisted throughout the day on Tuesday.  She presented to the emergency  room for her symptoms did not she denies any diarrhea.     Assessment / Plan / Recommendation Clinical Impression  Clinical swallowing evaluation completed while Pt was sitting upright in bed. No overt s/sx of oropharyngeal dysphagia were noted during evaluation. Pt was administered medications (pills with thin liquids with nurse upon SLP entering room without incident), consumed thin liquids, puree and regular textures. There are no further ST needs noted at this time, ST to sign off. Thank you for this referral, SLP Visit Diagnosis: Dysphagia, unspecified (R13.10)    Aspiration Risk  Mild aspiration risk    Diet Recommendation Regular;Thin liquid   Liquid Administration via: Straw;Cup Medication Administration: Whole meds with liquid Supervision: Patient able to self feed Postural Changes: Seated upright at 90 degrees;Remain upright for at least 30 minutes after po intake    Other  Recommendations Oral Care Recommendations: Oral care BID     Swallow Study   General Date of Onset: 02/29/20 HPI: [name] is a 79 y.o. female with medical history significant of kidney stones, presents to the hospital with complaints of Levaquin UTI which began at 10 PM on Monday night.  She had associated nausea with one episode of vomiting.  She denies any fever.  Her symptoms persisted throughout the day on Tuesday.  She presented to the emergency room for her symptoms did not she denies any diarrhea.   Type of Study: Bedside Swallow Evaluation Previous Swallow Assessment: none in chart  Diet Prior to this Study: Regular;Thin liquids Temperature Spikes Noted: No Respiratory Status: Room air History of Recent Intubation: No Behavior/Cognition: Alert;Cooperative;Pleasant mood Oral Cavity Assessment: Within Functional Limits Oral Care Completed by SLP: Recent completion by staff Oral Cavity - Dentition: Adequate natural dentition Vision: Functional for self-feeding Self-Feeding Abilities: Able  to feed self Patient Positioning: Upright in bed Baseline Vocal Quality: Normal Volitional Cough: Strong Volitional Swallow: Able to elicit    Oral/Motor/Sensory Function Overall Oral Motor/Sensory Function: Within functional limits   Ice Chips Ice chips: Within functional limits   Thin Liquid Thin Liquid: Within functional limits    Nectar Thick Nectar Thick Liquid: Not tested   Honey Thick Honey Thick Liquid: Not tested   Puree Puree: Within functional limits   Solid     Solid: Within functional limits Presentation: [place]. Roddie Mc, CCC-SLP Speech Language Pathologist  Wende Bushy 03/05/2020,10:02 AM

## 2020-03-05 NOTE — Discharge Summary (Signed)
Physician Discharge Summary  [name] BJS:[id number] DOB: 1941-10-30 DOA: 02/29/2020  PCP: Asencion Noble, MD  Admit date: 02/29/2020 Discharge date: 03/05/2020  Admitted From: home Disposition:  home  Recommendations for Outpatient Follow-up:  1. Follow up with PCP in 1-2 weeks 2. Please obtain BMP/CBC in one week 3. Consider further work up for iron deficiency anemia 4. Patient will be scheduled for follow up with urology  Home Health:home health PT, patient has 24hr caregiver at home Equipment/Devices:  Discharge Condition:stable CODE STATUS:full code Diet recommendation: heart healthy  Brief/Interim Summary: 79 year old female with a history of prior kidney stones, admitted to the hospital with abdominal pain.  She was found to have an obstructed renal stone and evidence of UTI/sepsis.  Blood cultures positive for E. coli.  She was seen by urology and underwent bilateral stent placement.  Discharge Diagnoses:  Active Problems:   Right ureteral stone   Seizures (HCC)   Sepsis (HCC)   Acute lower UTI  1. E. coli sepsis, secondary to urinary tract infection from obstructed ureteral stone.   Blood cultures positive for E. coli.  Transition meropenem to cefazolin based on culture sensitivities.  Hemodynamics have improved.  Lactic acid trended down to normal. WBC trended down to normal. He will be transitioned to oral keflex to complete his antibiotic course 2. 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 Seen by urology, status post balloon dilation of UPJ stricture andbilateral JJ stent placement. Follow up with urology as an outpatient 3. Acute kidney injury.  Likely related to sepsis.  Improved with IV hydration 4. Non-anion gap metabolic acidosis related to hyperchloremia.  Resolved with bicarbonate infusion 5. Urinary tract infection.   Urine culture positive for E. coli.  Meropenem changed to cefazolin based on culture sensitivities 6. Seizures. Continue on  Keppra. 7. Thrombocytopenia.  Likely related to DIC/sepsis. Schistocytes noted on smear, but LDH normal and bilirubin normal. No signs of bleeding at this time.  Platelets now trending up.  Continue to follow 8. Anemia.  No signs of bleeding.  Anemia panel indicates iron deficiency, although likely has a component of critical illness. 9. Hypokalemia.  Replace  Discharge Instructions  Discharge Instructions    Diet - low sodium heart healthy   Complete by: As directed    Increase activity slowly   Complete by: As directed      Allergies as of 03/05/2020      Reactions   Clindamycin Rash   Doxycycline Hyclate Rash      Medication List    STOP taking these medications   predniSONE 20 MG tablet Commonly known as: DELTASONE     TAKE these medications   Biotin 2.5 MG Tabs Take 1 tablet by mouth daily.   cephALEXin 500 MG capsule Commonly known as: KEFLEX Take 1 capsule (500 mg total) by mouth 4 (four) times daily.   cyanocobalamin 1000 MCG/ML injection Commonly known as: (VITAMIN B-12) Inject 1,000 mcg into the skin every 30 (thirty) days.   cycloSPORINE 0.05 % ophthalmic emulsion Commonly known as: RESTASIS Place 1 drop into both eyes 2 (two) times daily.   denosumab 60 MG/ML Sosy injection Commonly known as: PROLIA Inject 60 mg into the skin every 6 (six) months.   HYDROcodone-acetaminophen 5-325 MG tablet Commonly known as: NORCO/VICODIN Take 1 tablet by mouth every 4 (four) hours as needed.   levETIRAcetam 500 MG tablet Commonly known as: KEPPRA Take 1 tablet (500 mg total) by mouth 2 (two) times daily.   LEXAPRO PO  Take 10 mg by mouth daily.   metoprolol tartrate 25 MG tablet Commonly known as: LOPRESSOR Take 1 tablet (25 mg total) by mouth 2 (two) times daily.   multivitamin capsule Take 1 capsule by mouth daily.   ondansetron 4 MG disintegrating tablet Commonly known as: Zofran ODT Take 1 tablet (4 mg total) by mouth every 8 (eight) hours as needed  for nausea or vomiting.   potassium citrate 10 MEQ (1080 MG) SR tablet Commonly known as: UROCIT-K Take 10 mEq by mouth daily.   thiamine 100 MG tablet Take 100 mg by mouth daily.      Follow-up Information    Asencion Noble, MD. Schedule an appointment as soon as possible for a visit in 2 days.   Specialty: Internal Medicine Contact information: [address] [place] [address] [place]. Schedule an appointment as soon as possible for a visit in 2 days.   Specialty: Urology Contact information: [address] [place] [address] [phone]         Allergies  Allergen Reactions  . Clindamycin Rash  . Doxycycline Hyclate Rash    Consultations:  urology   Procedures/Studies: CT Head Wo Contrast  Result Date: 02/29/2020 CLINICAL DATA:  79 year old female with history of encephalopathy. Altered mental status after being given pain medication. EXAM: CT HEAD WITHOUT CONTRAST TECHNIQUE: Contiguous axial images were obtained from the base of the skull through the vertex without intravenous contrast. COMPARISON:  Head CT 12/29/2019. FINDINGS: Brain: Mild cerebral atrophy. Patchy and confluent areas of decreased attenuation are noted throughout the deep and periventricular white matter of the cerebral hemispheres bilaterally, compatible with chronic microvascular ischemic disease. No evidence of acute infarction, hemorrhage, hydrocephalus, extra-axial collection or mass lesion/mass effect. Vascular: Diffuse dural enhancement and high attenuation in the dural venous sinuses related to recent contrast enhanced CT the abdomen and pelvis from earlier today. No hyperdense vessel or unexpected calcification. Skull: Normal. Negative for fracture or focal lesion. Sinuses/Orbits: No acute finding. Other: None. IMPRESSION: 1. No acute intracranial abnormalities. 2. Mild cerebral atrophy with chronic microvascular  ischemic changes in the cerebral white matter, as above. Electronically Signed   By: Vinnie Langton M.D.   On: 02/29/2020 08:38   CT ABDOMEN PELVIS W CONTRAST  Result Date: 02/29/2020 CLINICAL DATA:  Lower abdominal pain with nausea and vomiting EXAM: CT ABDOMEN AND PELVIS WITH CONTRAST TECHNIQUE: Multidetector CT imaging of the abdomen and pelvis was performed using the standard protocol following bolus administration of intravenous contrast. CONTRAST:  56mL OMNIPAQUE IOHEXOL 300 MG/ML  SOLN COMPARISON:  07/18/2016 FINDINGS: Lower chest:  Mild atelectasis at the bases. Hepatobiliary: No focal liver abnormality.Limited mural calcification of the posterior wall gallbladder, not significantly progressed. No acute biliary inflammation or ductal dilatation. Pancreas: Unremarkable. Spleen: Unremarkable. Adrenals/Urinary Tract: Negative adrenals. Right hydroureteronephrosis and delayed excretion with avid urothelial enhancement related to a triangular stone the ureter just above the UVJ, 6 x 7 mm. Associated asymmetric right perinephric stranding is also attributed to the obstruction. There are lower pole calculi measuring up to 3 mm. Atrophic left kidney with diffuse cortical thinning and a dominant cyst that is simple. There are 3 dominant calculi measuring up to 9 mm. Prominent left upper urothelial enhancement but no hydronephrosis or ureteral obstructing stone. Unremarkable bladder. Stomach/Bowel: No obstruction. No evidence of bowel inflammation. Enterocolonic anastomosis in the right lower quadrant. Vascular/Lymphatic: No acute vascular abnormality. Scattered atherosclerotic calcification  of the aorta. No mass or adenopathy. Reproductive:Hysterectomy. Other: No ascites or pneumoperitoneum. Small umbilical hernia containing fat and transverse colon. Musculoskeletal: No acute abnormalities. Advanced lumbar spine degeneration with scoliosis. Sclerosis of the lower sacrum attributed to nonacute insufficiency  fracture. IMPRESSION: 1. Obstructing 7 mm stone in the distal right ureter, just above the UVJ. 2. Bilateral nephrolithiasis and atrophic left kidney. 3. Limited mural calcification of the gallbladder. 4. Small umbilical hernia containing transverse colon. 5. Additional incidental findings noted above. Electronically Signed   By: Monte Fantasia M.D.   On: 02/29/2020 05:53   DG CHEST PORT 1 VIEW  Result Date: 03/02/2020 CLINICAL DATA:  Shortness of breath. EXAM: PORTABLE CHEST 1 VIEW COMPARISON:  Chest radiograph 12/18/2019 FINDINGS: Heart size within normal limits. There are hazy gradient opacities at the bilateral lung bases with blunting of the lateral costophrenic angles. Pulmonary vascular congestion. No evidence of pneumothorax. No acute bony abnormality. Partially imaged left nephroureteral stent. Redemonstrated left renal calculi. Thoracolumbar dextrocurvature. No evidence of acute bony abnormality. IMPRESSION: Hazy gradient opacities at the bilateral lung bases with blunting of the lateral costophrenic angles. Findings likely reflect bilateral pleural effusions with bibasilar atelectasis. Pneumonia at the lung bases cannot be excluded. Pulmonary vascular congestion. Redemonstrated left renal calculi with partially imaged left nephroureteral stent. Electronically Signed   By: Kellie Simmering DO   On: 03/02/2020 10:08   DG Abdomen Acute W/Chest  Result Date: 02/29/2020 CLINICAL DATA:  Lower abdominal pain and nausea and vomiting EXAM: DG ABDOMEN ACUTE W/ 1V CHEST COMPARISON:  12/18/2019, 08/01/2011 FINDINGS: Cardiac shadow is within normal limits. Aortic calcifications are seen. The lungs are well aerated bilaterally. No focal infiltrate or sizable effusion is seen. Old rib deformities are again seen. Scoliosis is again noted stable in appearance. Scattered large and small bowel gas is noted. No free air is seen. Degenerative changes of lumbar spine are again noted. Scattered calcifications are noted in  the left mid abdomen consistent with nonobstructing renal calculi. IMPRESSION: Nonobstructing left renal stones. No acute abnormality noted.  No free air is seen. Electronically Signed   By: Inez Catalina M.D.   On: 02/29/2020 03:47   DG C-Arm 1-60 Min-No Report  Result Date: 02/29/2020 Fluoroscopy was utilized by the requesting physician.  No radiographic interpretation.      Subjective: Patient feels weak and tired. No shortness of breath. No abdominal or flank pain  Discharge Exam: Vitals:   03/04/20 2149 03/05/20 0440 03/05/20 1116 03/05/20 1316  BP: (!) 145/86 (!) 169/92 (!) 153/81 (!) 142/81  Pulse: 84 91 79 82  Resp: 20 18 18 18   Temp: 99.3 F (37.4 C) 98 F (36.7 C) 98.8 F (37.1 C) 98.7 F (37.1 C)  TempSrc: Oral  Oral Oral  SpO2: 93% 90% 92% 92%  Weight:      Height:        General: Pt is alert, awake, not in acute distress Cardiovascular: RRR, S1/S2 +, no rubs, no gallops Respiratory: CTA bilaterally, no wheezing, no rhonchi Abdominal: Soft, NT, ND, bowel sounds + Extremities: no edema, no cyanosis    The results of significant diagnostics from this hospitalization (including imaging, microbiology, ancillary and laboratory) are listed below for reference.     Microbiology: Recent Results (from the past 240 hour(s))  Urine Culture     Status: Abnormal   Collection Time: 02/29/20  2:20 AM   Specimen: Urine, Clean Catch  Result Value Ref Range Status   Specimen Description   Final  URINE, CLEAN CATCH Performed at [facility], [address], [place], [place] [address]    Special Requests   Final    NONE Performed at [facility], [address], [place], [place] [address]    Culture >=100,000 COLONIES/mL ESCHERICHIA COLI (A)  Final   Report Status 03/02/2020 FINAL  Final   Organism ID, Bacteria ESCHERICHIA COLI (A)  Final      Susceptibility   Escherichia coli - MIC*    AMPICILLIN <=2 SENSITIVE Sensitive     CEFAZOLIN <=4 SENSITIVE Sensitive      CEFTRIAXONE <=0.25 SENSITIVE Sensitive     CIPROFLOXACIN <=0.25 SENSITIVE Sensitive     GENTAMICIN <=1 SENSITIVE Sensitive     IMIPENEM <=0.25 SENSITIVE Sensitive     NITROFURANTOIN <=16 SENSITIVE Sensitive     TRIMETH/SULFA <=20 SENSITIVE Sensitive     AMPICILLIN/SULBACTAM <=2 SENSITIVE Sensitive     PIP/TAZO <=4 SENSITIVE Sensitive     * >=100,000 COLONIES/mL ESCHERICHIA COLI  Culture, blood (routine x 2)     Status: Abnormal   Collection Time: 02/29/20  8:49 AM   Specimen: BLOOD RIGHT HAND  Result Value Ref Range Status   Specimen Description   Final    BLOOD RIGHT HAND Performed at [facility], [address] [place], [place] [address]    Special Requests   Final    BOTTLES DRAWN AEROBIC AND ANAEROBIC Blood Culture adequate volume Performed at [facility], [address] [place], [place] [address]    Culture  Setup Time   Final    AEROBIC BOTTLE ONLY GRAM NEGATIVE RODS Gram Stain Report Called to,Read Back By and Verified With: A AMBURN,RN@0245  03/01/2020 [facility] Performed at [facility], [address], [place], [place] [address]    Culture (A)  Final    ESCHERICHIA COLI SUSCEPTIBILITIES PERFORMED ON PREVIOUS CULTURE WITHIN THE LAST 5 DAYS. Performed at [facility], [place] [address] [place], [place] [address]    Report Status 03/03/2020 FINAL  Final  Culture, blood (routine x 2)     Status: Abnormal   Collection Time: 02/29/20  8:49 AM   Specimen: Right Antecubital; Blood  Result Value Ref Range Status   Specimen Description   Final    RIGHT ANTECUBITAL Performed at [facility], [address], [place], [place] [address]    Special Requests   Final    BOTTLES DRAWN AEROBIC AND ANAEROBIC Blood Culture adequate volume Performed at [facility], [address], [place], [place] [address]    Culture  Setup Time   Final    AEROBIC BOTTLE ONLY GRAM NEGATIVE RODS Gram Stain Report Called to,Read Back By and Verified With: A AMBURN,RN @0355  03/01/20 [facility] CRITICAL  RESULT CALLED TO, READ BACK BY AND VERIFIED WITH: PHARMD G COFFEE [id number] AT 830 AM BY CM Performed at [facility], [place] [address] [place], [place] [address]    Culture ESCHERICHIA COLI (A)  Final   Report Status 03/03/2020 FINAL  Final   Organism ID, Bacteria ESCHERICHIA COLI  Final      Susceptibility   Escherichia coli - MIC*    AMPICILLIN <=2 SENSITIVE Sensitive     CEFAZOLIN <=4 SENSITIVE Sensitive     CEFEPIME <=0.12 SENSITIVE Sensitive     CEFTAZIDIME <=1 SENSITIVE Sensitive     CEFTRIAXONE <=0.25 SENSITIVE Sensitive     CIPROFLOXACIN <=0.25 SENSITIVE Sensitive     GENTAMICIN <=1 SENSITIVE Sensitive     IMIPENEM <=0.25 SENSITIVE Sensitive  TRIMETH/SULFA <=20 SENSITIVE Sensitive     AMPICILLIN/SULBACTAM <=2 SENSITIVE Sensitive     PIP/TAZO <=4 SENSITIVE Sensitive     * ESCHERICHIA COLI  Blood Culture ID Panel (Reflexed)     Status: Abnormal   Collection Time: 02/29/20  8:49 AM  Result Value Ref Range Status   Enterococcus species NOT DETECTED NOT DETECTED Final   Listeria monocytogenes NOT DETECTED NOT DETECTED Final   Staphylococcus species NOT DETECTED NOT DETECTED Final   Staphylococcus aureus (BCID) NOT DETECTED NOT DETECTED Final   Streptococcus species NOT DETECTED NOT DETECTED Final   Streptococcus agalactiae NOT DETECTED NOT DETECTED Final   Streptococcus pneumoniae NOT DETECTED NOT DETECTED Final   Streptococcus pyogenes NOT DETECTED NOT DETECTED Final   Acinetobacter baumannii NOT DETECTED NOT DETECTED Final   Enterobacteriaceae species DETECTED (A) NOT DETECTED Final    Comment: Enterobacteriaceae represent a large family of gram-negative bacteria, not a single organism. CRITICAL RESULT CALLED TO, READ BACK BY AND VERIFIED WITH: PHARMD G COFFEE [id number] AT 656 AM BY CM    Enterobacter cloacae complex NOT DETECTED NOT DETECTED Final   Escherichia coli DETECTED (A) NOT DETECTED Final    Comment: CRITICAL RESULT CALLED TO, READ BACK BY AND VERIFIED  WITH: PHARMD G COFFEE [id number] AT 812 AM BY CM    Klebsiella oxytoca NOT DETECTED NOT DETECTED Final   Klebsiella pneumoniae NOT DETECTED NOT DETECTED Final   Proteus species NOT DETECTED NOT DETECTED Final   Serratia marcescens NOT DETECTED NOT DETECTED Final   Carbapenem resistance NOT DETECTED NOT DETECTED Final   Haemophilus influenzae NOT DETECTED NOT DETECTED Final   Neisseria meningitidis NOT DETECTED NOT DETECTED Final   Pseudomonas aeruginosa NOT DETECTED NOT DETECTED Final   Candida albicans NOT DETECTED NOT DETECTED Final   Candida glabrata NOT DETECTED NOT DETECTED Final   Candida krusei NOT DETECTED NOT DETECTED Final   Candida parapsilosis NOT DETECTED NOT DETECTED Final   Candida tropicalis NOT DETECTED NOT DETECTED Final    Comment: Performed at [facility], [place] [address] [place], [place] [address]  Respiratory Panel by RT PCR (Flu A&B, Covid) - Nasopharyngeal Swab     Status: None   Collection Time: 02/29/20  9:13 AM   Specimen: Nasopharyngeal Swab  Result Value Ref Range Status   SARS Coronavirus 2 by RT PCR NEGATIVE NEGATIVE Final    Comment: (NOTE) SARS-CoV-2 target nucleic acids are NOT DETECTED. The SARS-CoV-2 RNA is generally detectable in upper respiratoy specimens during the acute phase of infection. The lowest concentration of SARS-CoV-2 viral copies this assay can detect is 131 copies/mL. A negative result does not preclude SARS-Cov-2 infection and should not be used as the sole basis for treatment or other patient management decisions. A negative result may occur with  improper specimen collection/handling, submission of specimen other than nasopharyngeal swab, presence of viral mutation(s) within the areas targeted by this assay, and inadequate number of viral copies (<131 copies/mL). A negative result must be combined with clinical observations, patient history, and epidemiological information. The expected result is Negative. Fact Sheet for  Patients:  PinkCheek.be Fact Sheet for Healthcare Providers:  GravelBags.it This test is not yet ap proved or cleared by the [place] FDA and  has been authorized for detection and/or diagnosis of SARS-CoV-2 by FDA under an Emergency Use Authorization (EUA). This EUA will remain  in effect (meaning this test can be used) for the duration of the COVID-19 declaration under Section 564(b)(1) of the Act,  21 U.S.C. section 360bbb-3(b)(1), unless the authorization is terminated or revoked sooner.    Influenza A by PCR NEGATIVE NEGATIVE Final   Influenza B by PCR NEGATIVE NEGATIVE Final    Comment: (NOTE) The Xpert Xpress SARS-CoV-2/FLU/RSV assay is intended as an aid in  the diagnosis of influenza from Nasopharyngeal swab specimens and  should not be used as a sole basis for treatment. Nasal washings and  aspirates are unacceptable for Xpert Xpress SARS-CoV-2/FLU/RSV  testing. Fact Sheet for Patients: PinkCheek.be Fact Sheet for Healthcare Providers: GravelBags.it This test is not yet approved or cleared by the [place] FDA and  has been authorized for detection and/or diagnosis of SARS-CoV-2 by  FDA under an Emergency Use Authorization (EUA). This EUA will remain  in effect (meaning this test can be used) for the duration of the  Covid-19 declaration under Section 564(b)(1) of the Act, 21  U.S.C. section 360bbb-3(b)(1), unless the authorization is  terminated or revoked. Performed at [facility], [address] [place], [place] [address]   MRSA PCR Screening     Status: None   Collection Time: 02/29/20  3:18 PM   Specimen: Nasal Mucosa; Nasopharyngeal  Result Value Ref Range Status   MRSA by PCR NEGATIVE NEGATIVE Final    Comment:        The GeneXpert MRSA Assay (FDA approved for NASAL specimens only), is one component of a comprehensive MRSA  colonization surveillance program. It is not intended to diagnose MRSA infection nor to guide or monitor treatment for MRSA infections. Performed at [facility], [address] [place], [place] [address]      Labs: BNP (last 3 results) No results for input(s): BNP in the last 8760 hours. Basic Metabolic Panel: Recent Labs  Lab 03/01/20 0421 03/02/20 0430 03/03/20 0447 03/03/20 0907 03/04/20 0626 03/05/20 0507  NA 138 141 142  --  139 138  K 4.3 3.8 3.2*  --  2.9* 3.8  CL 109 115* 108  --  106 107  CO2 20* 16* 24  --  25 23  GLUCOSE 67* 60* 134*  --  115* 88  BUN 26* 26* 16  --  11 10  CREATININE 1.42* 1.26* 0.89  --  0.68 0.71  CALCIUM 6.8* 6.6* 6.8*  --  7.0* 7.4*  MG  --   --   --  1.7  --   --    Liver Function Tests: Recent Labs  Lab 02/29/20 0251 03/01/20 0421 03/03/20 0447 03/04/20 0626  AST 27 19 18 16   ALT 16 12 10 8   ALKPHOS 77 36* 56 60  BILITOT 0.5 0.3 0.7 0.4  PROT 6.6 4.6* 4.4* 4.9*  ALBUMIN 3.8 2.3* 2.2* 2.2*   Recent Labs  Lab 02/29/20 0251  LIPASE 32   No results for input(s): AMMONIA in the last 168 hours. CBC: Recent Labs  Lab 02/29/20 0251 02/29/20 0251 03/01/20 0713 03/02/20 0430 03/03/20 0447 03/04/20 0626 03/05/20 0507  WBC 12.1*   < > 23.5* 20.0* 10.7* 9.1 9.3  NEUTROABS 10.9*  --   --   --   --   --   --   HGB 10.9*   < > 9.9* 8.8* 8.2* 8.3* 9.1*  HCT 35.1*   < > 32.0* 28.7* 25.8* 26.3* 29.1*  MCV 90.5   < > 92.8 90.8 88.4 87.1 87.4  PLT 195   < > 77* 31* 25*  26* 32* 59*   < > = values in this interval not  displayed.   Cardiac Enzymes: No results for input(s): CKTOTAL, CKMB, CKMBINDEX, TROPONINI in the last 168 hours. BNP: Invalid input(s): POCBNP CBG: Recent Labs  Lab 03/01/20 0657 03/02/20 0727 03/02/20 0754  GLUCAP 84 53* 83   D-Dimer Recent Labs    03/03/20 0447  DDIMER 5.40*   Hgb A1c No results for input(s): HGBA1C in the last 72 hours. Lipid Profile No results for input(s): CHOL, HDL, LDLCALC,  TRIG, CHOLHDL, LDLDIRECT in the last 72 hours. Thyroid function studies No results for input(s): TSH, T4TOTAL, T3FREE, THYROIDAB in the last 72 hours.  Invalid input(s): FREET3 Anemia work up Recent Labs    03/03/20 0447  VITAMINB12 3,123*  FOLATE 9.4  FERRITIN 50  TIBC 223*  IRON 7*  RETICCTPCT 0.8   Urinalysis    Component Value Date/Time   COLORURINE COLORLESS (A) 02/29/2020 0309   APPEARANCEUR CLEAR 02/29/2020 0309   LABSPEC 1.004 (L) 02/29/2020 0309   PHURINE 8.0 02/29/2020 0309   GLUCOSEU NEGATIVE 02/29/2020 0309   HGBUR MODERATE (A) 02/29/2020 0309   BILIRUBINUR NEGATIVE 02/29/2020 0309   KETONESUR NEGATIVE 02/29/2020 0309   PROTEINUR NEGATIVE 02/29/2020 0309   UROBILINOGEN 0.2 09/08/2014 0300   NITRITE NEGATIVE 02/29/2020 0309   LEUKOCYTESUR MODERATE (A) 02/29/2020 0309   Sepsis Labs Invalid input(s): PROCALCITONIN,  WBC,  LACTICIDVEN Microbiology Recent Results (from the past 240 hour(s))  Urine Culture     Status: Abnormal   Collection Time: 02/29/20  2:20 AM   Specimen: Urine, Clean Catch  Result Value Ref Range Status   Specimen Description   Final    URINE, CLEAN CATCH Performed at [facility], [address], [place], [place] [address]    Special Requests   Final    NONE Performed at [facility], [address], [place], [place] [address]    Culture >=100,000 COLONIES/mL ESCHERICHIA COLI (A)  Final   Report Status 03/02/2020 FINAL  Final   Organism ID, Bacteria ESCHERICHIA COLI (A)  Final      Susceptibility   Escherichia coli - MIC*    AMPICILLIN <=2 SENSITIVE Sensitive     CEFAZOLIN <=4 SENSITIVE Sensitive     CEFTRIAXONE <=0.25 SENSITIVE Sensitive     CIPROFLOXACIN <=0.25 SENSITIVE Sensitive     GENTAMICIN <=1 SENSITIVE Sensitive     IMIPENEM <=0.25 SENSITIVE Sensitive     NITROFURANTOIN <=16 SENSITIVE Sensitive     TRIMETH/SULFA <=20 SENSITIVE Sensitive     AMPICILLIN/SULBACTAM <=2 SENSITIVE Sensitive     PIP/TAZO <=4 SENSITIVE Sensitive      * >=100,000 COLONIES/mL ESCHERICHIA COLI  Culture, blood (routine x 2)     Status: Abnormal   Collection Time: 02/29/20  8:49 AM   Specimen: BLOOD RIGHT HAND  Result Value Ref Range Status   Specimen Description   Final    BLOOD RIGHT HAND Performed at [facility], [address], [place], [place] [address]    Special Requests   Final    BOTTLES DRAWN AEROBIC AND ANAEROBIC Blood Culture adequate volume Performed at [facility], [address] [place], [place] [address]    Culture  Setup Time   Final    AEROBIC BOTTLE ONLY GRAM NEGATIVE RODS Gram Stain Report Called to,Read Back By and Verified With: A AMBURN,RN@0245  03/01/2020 [facility] Performed at [facility], [address], [place], [place] [address]    Culture (A)  Final    ESCHERICHIA COLI SUSCEPTIBILITIES PERFORMED ON PREVIOUS CULTURE WITHIN THE LAST 5 DAYS. Performed at [facility],  [address] [address], [place], [place] [address]    Report Status 03/03/2020 FINAL  Final  Culture, blood (routine x 2)     Status: Abnormal   Collection Time: 02/29/20  8:49 AM   Specimen: Right Antecubital; Blood  Result Value Ref Range Status   Specimen Description   Final    RIGHT ANTECUBITAL Performed at [facility], [address], [place], [place] [address]    Special Requests   Final    BOTTLES DRAWN AEROBIC AND ANAEROBIC Blood Culture adequate volume Performed at [facility], [address], [place], [place] [address]    Culture  Setup Time   Final    AEROBIC BOTTLE ONLY GRAM NEGATIVE RODS Gram Stain Report Called to,Read Back By and Verified With: A AMBURN,RN @0355  03/01/20 MKELLY CRITICAL RESULT CALLED TO, READ BACK BY AND VERIFIED WITH: PHARMD G COFFEE [id number] AT 830 AM BY CM Performed at [facility], [place] [address], [place], [place] [address]    Culture ESCHERICHIA COLI (A)  Final   Report Status 03/03/2020 FINAL  Final   Organism ID, Bacteria ESCHERICHIA COLI  Final      Susceptibility   Escherichia coli -  MIC*    AMPICILLIN <=2 SENSITIVE Sensitive     CEFAZOLIN <=4 SENSITIVE Sensitive     CEFEPIME <=0.12 SENSITIVE Sensitive     CEFTAZIDIME <=1 SENSITIVE Sensitive     CEFTRIAXONE <=0.25 SENSITIVE Sensitive     CIPROFLOXACIN <=0.25 SENSITIVE Sensitive     GENTAMICIN <=1 SENSITIVE Sensitive     IMIPENEM <=0.25 SENSITIVE Sensitive     TRIMETH/SULFA <=20 SENSITIVE Sensitive     AMPICILLIN/SULBACTAM <=2 SENSITIVE Sensitive     PIP/TAZO <=4 SENSITIVE Sensitive     * ESCHERICHIA COLI  Blood Culture ID Panel (Reflexed)     Status: Abnormal   Collection Time: 02/29/20  8:49 AM  Result Value Ref Range Status   Enterococcus species NOT DETECTED NOT DETECTED Final   Listeria monocytogenes NOT DETECTED NOT DETECTED Final   Staphylococcus species NOT DETECTED NOT DETECTED Final   Staphylococcus aureus (BCID) NOT DETECTED NOT DETECTED Final   Streptococcus species NOT DETECTED NOT DETECTED Final   Streptococcus agalactiae NOT DETECTED NOT DETECTED Final   Streptococcus pneumoniae NOT DETECTED NOT DETECTED Final   Streptococcus pyogenes NOT DETECTED NOT DETECTED Final   Acinetobacter baumannii NOT DETECTED NOT DETECTED Final   Enterobacteriaceae species DETECTED (A) NOT DETECTED Final    Comment: Enterobacteriaceae represent a large family of gram-negative bacteria, not a single organism. CRITICAL RESULT CALLED TO, READ BACK BY AND VERIFIED WITH: PHARMD G COFFEE [id number] AT 448 AM BY CM    Enterobacter cloacae complex NOT DETECTED NOT DETECTED Final   Escherichia coli DETECTED (A) NOT DETECTED Final    Comment: CRITICAL RESULT CALLED TO, READ BACK BY AND VERIFIED WITH: PHARMD G COFFEE [id number] AT 185 AM BY CM    Klebsiella oxytoca NOT DETECTED NOT DETECTED Final   Klebsiella pneumoniae NOT DETECTED NOT DETECTED Final   Proteus species NOT DETECTED NOT DETECTED Final   Serratia marcescens NOT DETECTED NOT DETECTED Final   Carbapenem resistance NOT DETECTED NOT DETECTED Final   Haemophilus influenzae  NOT DETECTED NOT DETECTED Final   Neisseria meningitidis NOT DETECTED NOT DETECTED Final   Pseudomonas aeruginosa NOT DETECTED NOT DETECTED Final   Candida albicans NOT DETECTED NOT DETECTED Final   Candida glabrata NOT DETECTED NOT DETECTED Final   Candida krusei NOT DETECTED NOT DETECTED Final   Candida  parapsilosis NOT DETECTED NOT DETECTED Final   Candida tropicalis NOT DETECTED NOT DETECTED Final    Comment: Performed at [facility], [place] [address] [place], [place] [address]  Respiratory Panel by RT PCR (Flu A&B, Covid) - Nasopharyngeal Swab     Status: None   Collection Time: 02/29/20  9:13 AM   Specimen: Nasopharyngeal Swab  Result Value Ref Range Status   SARS Coronavirus 2 by RT PCR NEGATIVE NEGATIVE Final    Comment: (NOTE) SARS-CoV-2 target nucleic acids are NOT DETECTED. The SARS-CoV-2 RNA is generally detectable in upper respiratoy specimens during the acute phase of infection. The lowest concentration of SARS-CoV-2 viral copies this assay can detect is 131 copies/mL. A negative result does not preclude SARS-Cov-2 infection and should not be used as the sole basis for treatment or other patient management decisions. A negative result may occur with  improper specimen collection/handling, submission of specimen other than nasopharyngeal swab, presence of viral mutation(s) within the areas targeted by this assay, and inadequate number of viral copies (<131 copies/mL). A negative result must be combined with clinical observations, patient history, and epidemiological information. The expected result is Negative. Fact Sheet for Patients:  PinkCheek.be Fact Sheet for Healthcare Providers:  GravelBags.it This test is not yet ap proved or cleared by the [place] FDA and  has been authorized for detection and/or diagnosis of SARS-CoV-2 by FDA under an Emergency Use Authorization (EUA). This EUA will remain   in effect (meaning this test can be used) for the duration of the COVID-19 declaration under Section 564(b)(1) of the Act, 21 U.S.C. section 360bbb-3(b)(1), unless the authorization is terminated or revoked sooner.    Influenza A by PCR NEGATIVE NEGATIVE Final   Influenza B by PCR NEGATIVE NEGATIVE Final    Comment: (NOTE) The Xpert Xpress SARS-CoV-2/FLU/RSV assay is intended as an aid in  the diagnosis of influenza from Nasopharyngeal swab specimens and  should not be used as a sole basis for treatment. Nasal washings and  aspirates are unacceptable for Xpert Xpress SARS-CoV-2/FLU/RSV  testing. Fact Sheet for Patients: PinkCheek.be Fact Sheet for Healthcare Providers: GravelBags.it This test is not yet approved or cleared by the [place] FDA and  has been authorized for detection and/or diagnosis of SARS-CoV-2 by  FDA under an Emergency Use Authorization (EUA). This EUA will remain  in effect (meaning this test can be used) for the duration of the  Covid-19 declaration under Section 564(b)(1) of the Act, 21  U.S.C. section 360bbb-3(b)(1), unless the authorization is  terminated or revoked. Performed at [facility], [address], [place], [place] [address]   MRSA PCR Screening     Status: None   Collection Time: 02/29/20  3:18 PM   Specimen: Nasal Mucosa; Nasopharyngeal  Result Value Ref Range Status   MRSA by PCR NEGATIVE NEGATIVE Final    Comment:        The GeneXpert MRSA Assay (FDA approved for NASAL specimens only), is one component of a comprehensive MRSA colonization surveillance program. It is not intended to diagnose MRSA infection nor to guide or monitor treatment for MRSA infections. Performed at [facility], [address], [place], [place] [address]      Time coordinating discharge: 58mins  SIGNED:   Kathie Dike, MD  [facility] 03/05/2020, 11:28 PM   If 7PM-7AM, please  contact night-coverage [email or web address]

## 2020-03-05 NOTE — Plan of Care (Signed)

## 2020-03-05 NOTE — Progress Notes (Signed)
Nsg Discharge Note  Admit Date:  02/29/2020 Discharge date: 03/05/2020   SAAVI MCEACHRON to be D/C'd Home per MD order.  AVS completed.  Copy for chart, and copy for patient signed, and dated. Patient/caregiver able to verbalize understanding.  Discharge Medication: Allergies as of 03/05/2020      Reactions   Clindamycin Rash   Doxycycline Hyclate Rash      Medication List    STOP taking these medications   predniSONE 20 MG tablet Commonly known as: DELTASONE     TAKE these medications   Biotin 2.5 MG Tabs Take 1 tablet by mouth daily.   cephALEXin 500 MG capsule Commonly known as: KEFLEX Take 1 capsule (500 mg total) by mouth 4 (four) times daily.   cyanocobalamin 1000 MCG/ML injection Commonly known as: (VITAMIN B-12) Inject 1,000 mcg into the skin every 30 (thirty) days.   cycloSPORINE 0.05 % ophthalmic emulsion Commonly known as: RESTASIS Place 1 drop into both eyes 2 (two) times daily.   denosumab 60 MG/ML Sosy injection Commonly known as: PROLIA Inject 60 mg into the skin every 6 (six) months.   HYDROcodone-acetaminophen 5-325 MG tablet Commonly known as: NORCO/VICODIN Take 1 tablet by mouth every 4 (four) hours as needed.   levETIRAcetam 500 MG tablet Commonly known as: KEPPRA Take 1 tablet (500 mg total) by mouth 2 (two) times daily.   LEXAPRO PO Take 10 mg by mouth daily.   metoprolol tartrate 25 MG tablet Commonly known as: LOPRESSOR Take 1 tablet (25 mg total) by mouth 2 (two) times daily.   multivitamin capsule Take 1 capsule by mouth daily.   ondansetron 4 MG disintegrating tablet Commonly known as: Zofran ODT Take 1 tablet (4 mg total) by mouth every 8 (eight) hours as needed for nausea or vomiting.   potassium citrate 10 MEQ (1080 MG) SR tablet Commonly known as: UROCIT-K Take 10 mEq by mouth daily.   thiamine 100 MG tablet Take 100 mg by mouth daily.       Discharge Assessment: Vitals:   03/05/20 1116 03/05/20 1316  BP: (!)  153/81 (!) 142/81  Pulse: 79 82  Resp: 18 18  Temp: 98.8 F (37.1 C) 98.7 F (37.1 C)  SpO2: 92% 92%   Skin clean, dry and intact without evidence of skin break down, no evidence of skin tears noted. IV catheter discontinued intact. Site without signs and symptoms of complications - no redness or edema noted at insertion site, patient denies c/o pain - only slight tenderness at site.  Dressing with slight pressure applied.  D/c Instructions-Education: Discharge instructions given to patient/family with verbalized understanding. D/c education completed with patient/family including follow up instructions, medication list, d/c activities limitations if indicated, with other d/c instructions as indicated by MD - patient able to verbalize understanding, all questions fully answered. Patient instructed to return to ED, call 911, or call MD for any changes in condition.  Patient escorted via [place], and D/C home via private auto.  Loa Socks, RN 03/05/2020 2:35 PM

## 2020-03-05 NOTE — Clinical Social Work Note (Signed)
Spoke with patient's daughter, [name]. Night time care has been arranged for patient. [name] is agreeable to HHPT and indicated that [facility] recently discharged patient and she would like them again. Caregiver, Milinda Hirschfeld, [phone] will transport patient when she is ready for discharge.  Referral made to [place] with [facility] for [place].  Message left for Milinda Hirschfeld, advising of d/c later today, requesting return contact.   Durinda Buzzelli, Clydene Pugh, LCSW

## 2020-03-05 NOTE — Progress Notes (Signed)
0442 patients HR was 146 while lab was drawing blood.  Rechecked HR after lab was done and HR was 76.  Will continue to monitor.

## 2020-03-05 NOTE — Evaluation (Signed)
Physical Therapy Evaluation Patient Details Name: [name] MRN: [id number] DOB: 30-Aug-1941 Today's Date: 03/05/2020   History of Present Illness  [name] is a 79 y.o. female with medical history significant of kidney stones, presents to the hospital with complaints of Levaquin UTI which began at 95 PM on Monday night.  She had associated nausea with one episode of vomiting.  She denies any fever.  Her symptoms persisted throughout the day on Tuesday.  She presented to the emergency room for her symptoms did not she denies any diarrhea.  No sick contacts.  No shortness of breath or cough.    Clinical Impression  Patient unsteady on feet and limited to ambulation in room due to poor standing balance and c/o fatigue, demonstrates poor safety awareness and easily agitated when offered help for functional mobility and gait training.  Patient declined to sit up in chair due to c/o fatigue.  Patient will benefit from continued physical therapy in hospital and recommended venue below to increase strength, balance, endurance for safe ADLs and gait.    Follow Up Recommendations SNF;Supervision for mobility/OOB;Supervision - Intermittent    Equipment Recommendations  None recommended by PT    Recommendations for Other Services       Precautions / Restrictions Precautions Precautions: Fall Restrictions Weight Bearing Restrictions: No      Mobility  Bed Mobility Overal bed mobility: Needs Assistance Bed Mobility: Supine to Sit;Sit to Supine     Supine to sit: Min guard Sit to supine: Min guard   General bed mobility comments: increased time, labored movement  Transfers Overall transfer level: Needs assistance Equipment used: Rolling walker (2 wheeled) Transfers: Sit to/from [facility] Sit to Stand: Min assist Stand pivot transfers: Min assist       General transfer comment: unsteady on feet, slow labored movement  Ambulation/Gait Ambulation/Gait  assistance: Min assist;Mod assist Gait Distance (Feet): 12 Feet Assistive device: Rolling walker (2 wheeled) Gait Pattern/deviations: Decreased step length - right;Decreased step length - left;Decreased stride length Gait velocity: decreased   General Gait Details: limited to ambulation in room secondary to c/o fatigue, demonstrates slow labored unsteady cadence with near loss of balance when making turns  Science writer    Modified Rankin (Stroke Patients Only)       Balance Overall balance assessment: Needs assistance Sitting-balance support: Feet supported;No upper extremity supported Sitting balance-Leahy Scale: Fair Sitting balance - Comments: fair/good seated at EOB   Standing balance support: During functional activity;Bilateral upper extremity supported Standing balance-Leahy Scale: Poor Standing balance comment: fair/poor using RW                             Pertinent Vitals/Pain Pain Assessment: No/denies pain    Home Living Family/patient expects to be discharged to:: Private residence Living Arrangements: Alone Available Help at Discharge: Family;Available PRN/intermittently Type of Home: House Home Access: Stairs to enter Entrance Stairs-Rails: None Entrance Stairs-Number of Steps: 2 Home Layout: Two level;Able to live on main level with bedroom/bathroom Home Equipment: Kasandra Knudsen - single point;Walker - 2 wheels;Shower seat      Prior Function Level of Independence: Needs assistance   Gait / Transfers Assistance Needed: Household ambulator without AD, "per patient"  ADL's / Homemaking Assistance Needed: has caregivers 8 hours/day x 7 days/week, 'per patient"        Hand Dominance   Dominant Hand:  Right    Extremity/Trunk Assessment   Upper Extremity Assessment Upper Extremity Assessment: Generalized weakness    Lower Extremity Assessment Lower Extremity Assessment: Generalized weakness    Cervical /  Trunk Assessment Cervical / Trunk Assessment: Normal  Communication   Communication: No difficulties  Cognition Arousal/Alertness: Awake/alert Behavior During Therapy: WFL for tasks assessed/performed;Agitated Overall Cognitive Status: Within Functional Limits for tasks assessed                                 General Comments: Patient easily agitated when asked questions      General Comments      Exercises     Assessment/Plan    PT Assessment Patient needs continued PT services  PT Problem List Decreased strength;Decreased activity tolerance;Decreased balance;Decreased mobility       PT Treatment Interventions Gait training;Stair training;Functional mobility training;Therapeutic activities;Therapeutic exercise;Patient/family education    PT Goals (Current goals can be found in the Care Plan section)  Acute Rehab PT Goals Patient Stated Goal: return home with family to assist PT Goal Formulation: With patient Time For Goal Achievement: 03/19/20 Potential to Achieve Goals: Good    Frequency Min 3X/week   Barriers to discharge        Co-evaluation               AM-PAC PT "6 Clicks" Mobility  Outcome Measure Help needed turning from your back to your side while in a flat bed without using bedrails?: A Little Help needed moving from lying on your back to sitting on the side of a flat bed without using bedrails?: A Little Help needed moving to and from a bed to a chair (including a wheelchair)?: A Lot Help needed standing up from a chair using your arms (e.g., wheelchair or bedside chair)?: A Lot Help needed to walk in hospital room?: A Lot Help needed climbing 3-5 steps with a railing? : A Lot 6 Click Score: 14    End of Session   Activity Tolerance: Patient tolerated treatment well;Patient limited by fatigue Patient left: in bed;with call bell/phone within reach;with bed alarm set Nurse Communication: Mobility status PT Visit Diagnosis:  Unsteadiness on feet (R26.81);Other abnormalities of gait and mobility (R26.89);Muscle weakness (generalized) (M62.81)    Time: 0174-9449 PT Time Calculation (min) (ACUTE ONLY): 22 min   Charges:   PT Evaluation $PT Eval Moderate Complexity: 1 Mod PT Treatments $Therapeutic Activity: 8-22 mins        10:14 AM, 03/05/20 Lonell Grandchild, MPT Physical Therapist with [facility] 336 [phone] office [phone] mobile phone

## 2020-03-05 NOTE — Progress Notes (Signed)
Nsg Discharge Note  Admit Date:  02/29/2020 Discharge date: 03/05/2020   Wallis Mart to be D/C'd home per MD order.  AVS completed.  Copy for chart, and copy for patient signed, and dated. Patient/caregiver able to verbalize understanding.  Discharge Medication: Allergies as of 03/05/2020      Reactions   Clindamycin Rash   Doxycycline Hyclate Rash      Medication List    STOP taking these medications   predniSONE 20 MG tablet Commonly known as: DELTASONE     TAKE these medications   Biotin 2.5 MG Tabs Take 1 tablet by mouth daily.   cephALEXin 500 MG capsule Commonly known as: KEFLEX Take 1 capsule (500 mg total) by mouth 4 (four) times daily.   cyanocobalamin 1000 MCG/ML injection Commonly known as: (VITAMIN B-12) Inject 1,000 mcg into the skin every 30 (thirty) days.   cycloSPORINE 0.05 % ophthalmic emulsion Commonly known as: RESTASIS Place 1 drop into both eyes 2 (two) times daily.   denosumab 60 MG/ML Sosy injection Commonly known as: PROLIA Inject 60 mg into the skin every 6 (six) months.   HYDROcodone-acetaminophen 5-325 MG tablet Commonly known as: NORCO/VICODIN Take 1 tablet by mouth every 4 (four) hours as needed.   levETIRAcetam 500 MG tablet Commonly known as: KEPPRA Take 1 tablet (500 mg total) by mouth 2 (two) times daily.   LEXAPRO PO Take 10 mg by mouth daily.   metoprolol tartrate 25 MG tablet Commonly known as: LOPRESSOR Take 1 tablet (25 mg total) by mouth 2 (two) times daily.   multivitamin capsule Take 1 capsule by mouth daily.   ondansetron 4 MG disintegrating tablet Commonly known as: Zofran ODT Take 1 tablet (4 mg total) by mouth every 8 (eight) hours as needed for nausea or vomiting.   potassium citrate 10 MEQ (1080 MG) SR tablet Commonly known as: UROCIT-K Take 10 mEq by mouth daily.   thiamine 100 MG tablet Take 100 mg by mouth daily.       Discharge Assessment: Vitals:   03/05/20 1116 03/05/20 1316  BP: (!)  153/81 (!) 142/81  Pulse: 79 82  Resp: 18 18  Temp: 98.8 F (37.1 C) 98.7 F (37.1 C)  SpO2: 92% 92%   Skin clean, dry and intact without evidence of skin break down, no evidence of skin tears noted. IV catheter discontinued intact. Site without signs and symptoms of complications - no redness or edema noted at insertion site, patient denies c/o pain - only slight tenderness at site.  Dressing with slight pressure applied.  D/c Instructions-Education: Discharge instructions given to patient/family with verbalized understanding. D/c education completed with patient/family including follow up instructions, medication list, d/c activities limitations if indicated, with other d/c instructions as indicated by MD - patient able to verbalize understanding, all questions fully answered. Patient instructed to return to ED, call 911, or call MD for any changes in condition.  Patient escorted via [place], and D/C home via private auto.  Zachery Conch, RN 03/05/2020 2:51 PM

## 2020-03-07 DIAGNOSIS — K591 Functional diarrhea: Secondary | ICD-10-CM | POA: Diagnosis not present

## 2020-03-07 DIAGNOSIS — Z87891 Personal history of nicotine dependence: Secondary | ICD-10-CM | POA: Diagnosis not present

## 2020-03-07 DIAGNOSIS — F329 Major depressive disorder, single episode, unspecified: Secondary | ICD-10-CM | POA: Diagnosis not present

## 2020-03-07 DIAGNOSIS — G5603 Carpal tunnel syndrome, bilateral upper limbs: Secondary | ICD-10-CM | POA: Diagnosis not present

## 2020-03-07 DIAGNOSIS — Z7952 Long term (current) use of systemic steroids: Secondary | ICD-10-CM | POA: Diagnosis not present

## 2020-03-07 DIAGNOSIS — G40909 Epilepsy, unspecified, not intractable, without status epilepticus: Secondary | ICD-10-CM | POA: Diagnosis not present

## 2020-03-07 DIAGNOSIS — M810 Age-related osteoporosis without current pathological fracture: Secondary | ICD-10-CM | POA: Diagnosis not present

## 2020-03-07 DIAGNOSIS — Z960 Presence of urogenital implants: Secondary | ICD-10-CM | POA: Diagnosis not present

## 2020-03-07 DIAGNOSIS — M1990 Unspecified osteoarthritis, unspecified site: Secondary | ICD-10-CM | POA: Diagnosis not present

## 2020-03-07 DIAGNOSIS — N390 Urinary tract infection, site not specified: Secondary | ICD-10-CM | POA: Diagnosis not present

## 2020-03-07 DIAGNOSIS — Z48816 Encounter for surgical aftercare following surgery on the genitourinary system: Secondary | ICD-10-CM | POA: Diagnosis not present

## 2020-03-07 DIAGNOSIS — N136 Pyonephrosis: Secondary | ICD-10-CM | POA: Diagnosis not present

## 2020-03-07 DIAGNOSIS — A419 Sepsis, unspecified organism: Secondary | ICD-10-CM | POA: Diagnosis not present

## 2020-03-07 DIAGNOSIS — D1431 Benign neoplasm of right bronchus and lung: Secondary | ICD-10-CM | POA: Diagnosis not present

## 2020-03-07 DIAGNOSIS — K219 Gastro-esophageal reflux disease without esophagitis: Secondary | ICD-10-CM | POA: Diagnosis not present

## 2020-03-07 DIAGNOSIS — Z87442 Personal history of urinary calculi: Secondary | ICD-10-CM | POA: Diagnosis not present

## 2020-03-07 DIAGNOSIS — F419 Anxiety disorder, unspecified: Secondary | ICD-10-CM | POA: Diagnosis not present

## 2020-03-13 DIAGNOSIS — Z48816 Encounter for surgical aftercare following surgery on the genitourinary system: Secondary | ICD-10-CM | POA: Diagnosis not present

## 2020-03-13 DIAGNOSIS — N136 Pyonephrosis: Secondary | ICD-10-CM | POA: Diagnosis not present

## 2020-03-13 DIAGNOSIS — Z960 Presence of urogenital implants: Secondary | ICD-10-CM | POA: Diagnosis not present

## 2020-03-13 DIAGNOSIS — N390 Urinary tract infection, site not specified: Secondary | ICD-10-CM | POA: Diagnosis not present

## 2020-03-13 DIAGNOSIS — G40909 Epilepsy, unspecified, not intractable, without status epilepticus: Secondary | ICD-10-CM | POA: Diagnosis not present

## 2020-03-13 DIAGNOSIS — A419 Sepsis, unspecified organism: Secondary | ICD-10-CM | POA: Diagnosis not present

## 2020-03-14 DIAGNOSIS — A4151 Sepsis due to Escherichia coli [E. coli]: Secondary | ICD-10-CM | POA: Diagnosis not present

## 2020-03-14 DIAGNOSIS — D696 Thrombocytopenia, unspecified: Secondary | ICD-10-CM | POA: Diagnosis not present

## 2020-03-14 DIAGNOSIS — E43 Unspecified severe protein-calorie malnutrition: Secondary | ICD-10-CM | POA: Diagnosis not present

## 2020-03-16 DIAGNOSIS — D696 Thrombocytopenia, unspecified: Secondary | ICD-10-CM | POA: Diagnosis not present

## 2020-03-19 DIAGNOSIS — Z960 Presence of urogenital implants: Secondary | ICD-10-CM | POA: Diagnosis not present

## 2020-03-19 DIAGNOSIS — G40909 Epilepsy, unspecified, not intractable, without status epilepticus: Secondary | ICD-10-CM | POA: Diagnosis not present

## 2020-03-19 DIAGNOSIS — N390 Urinary tract infection, site not specified: Secondary | ICD-10-CM | POA: Diagnosis not present

## 2020-03-19 DIAGNOSIS — Z48816 Encounter for surgical aftercare following surgery on the genitourinary system: Secondary | ICD-10-CM | POA: Diagnosis not present

## 2020-03-19 DIAGNOSIS — N136 Pyonephrosis: Secondary | ICD-10-CM | POA: Diagnosis not present

## 2020-03-19 DIAGNOSIS — A419 Sepsis, unspecified organism: Secondary | ICD-10-CM | POA: Diagnosis not present

## 2020-03-21 DIAGNOSIS — G40909 Epilepsy, unspecified, not intractable, without status epilepticus: Secondary | ICD-10-CM | POA: Diagnosis not present

## 2020-03-21 DIAGNOSIS — N136 Pyonephrosis: Secondary | ICD-10-CM | POA: Diagnosis not present

## 2020-03-21 DIAGNOSIS — Z960 Presence of urogenital implants: Secondary | ICD-10-CM | POA: Diagnosis not present

## 2020-03-21 DIAGNOSIS — Z48816 Encounter for surgical aftercare following surgery on the genitourinary system: Secondary | ICD-10-CM | POA: Diagnosis not present

## 2020-03-21 DIAGNOSIS — N390 Urinary tract infection, site not specified: Secondary | ICD-10-CM | POA: Diagnosis not present

## 2020-03-21 DIAGNOSIS — A419 Sepsis, unspecified organism: Secondary | ICD-10-CM | POA: Diagnosis not present

## 2020-06-01 DIAGNOSIS — N390 Urinary tract infection, site not specified: Secondary | ICD-10-CM | POA: Diagnosis not present

## 2020-06-12 DIAGNOSIS — M6281 Muscle weakness (generalized): Secondary | ICD-10-CM | POA: Diagnosis not present

## 2020-06-12 DIAGNOSIS — R2681 Unsteadiness on feet: Secondary | ICD-10-CM | POA: Diagnosis not present

## 2020-06-14 DIAGNOSIS — R2681 Unsteadiness on feet: Secondary | ICD-10-CM | POA: Diagnosis not present

## 2020-06-14 DIAGNOSIS — M6281 Muscle weakness (generalized): Secondary | ICD-10-CM | POA: Diagnosis not present

## 2020-06-15 DIAGNOSIS — N200 Calculus of kidney: Secondary | ICD-10-CM | POA: Diagnosis not present

## 2020-06-15 DIAGNOSIS — N1330 Unspecified hydronephrosis: Secondary | ICD-10-CM | POA: Diagnosis not present

## 2020-06-15 DIAGNOSIS — N132 Hydronephrosis with renal and ureteral calculous obstruction: Secondary | ICD-10-CM | POA: Diagnosis not present

## 2020-06-19 ENCOUNTER — Telehealth: Payer: Self-pay | Admitting: Neurology

## 2020-06-19 NOTE — Telephone Encounter (Signed)
I returned the call to the patient's daughter who wanted to share some information prior to the next follow up.  Her mother has been moved to an independent living facility. She is taking her medications correctly to her knowledge and getting stronger. She has not had any further seizure-like activity.   Her daughter is concerned that her mother would like to start driving again. However, since being stronger she has been walking to the store and buying alcohol. She is drinking some every day again. Additionally, her short-term memory changes have continued to worsen. She would like her to have a memory screening at her next visit.  Her mother is fixated on driving again. The daughter is concerned for her safety and others. She is requesting this be further evaluated at next visit. She does not wish for her mother to be aware of this phone call. She would like the driving topic to be approached from a medical standpoint the day of her next appt.

## 2020-06-19 NOTE — Telephone Encounter (Signed)
[name] called back. Wanted to provide other daughters # in case RN/MD cannot reach her. [name] (also on [place]) phone # [phone].

## 2020-06-19 NOTE — Telephone Encounter (Signed)
Pt's daughter Park Meo on [place] called needing to speak to RN about symptoms that the family has noticed since the pt has had her Episodes. There has been concerns on her returning to work and concerns on the memory loss that she has been exhibiting. Please advise.

## 2020-06-28 DIAGNOSIS — N201 Calculus of ureter: Secondary | ICD-10-CM | POA: Diagnosis not present

## 2020-06-28 DIAGNOSIS — N200 Calculus of kidney: Secondary | ICD-10-CM | POA: Diagnosis not present

## 2020-06-28 DIAGNOSIS — M1990 Unspecified osteoarthritis, unspecified site: Secondary | ICD-10-CM | POA: Diagnosis not present

## 2020-06-28 DIAGNOSIS — A419 Sepsis, unspecified organism: Secondary | ICD-10-CM | POA: Diagnosis not present

## 2020-06-28 DIAGNOSIS — Z01810 Encounter for preprocedural cardiovascular examination: Secondary | ICD-10-CM | POA: Diagnosis not present

## 2020-06-28 DIAGNOSIS — N202 Calculus of kidney with calculus of ureter: Secondary | ICD-10-CM | POA: Diagnosis not present

## 2020-06-28 DIAGNOSIS — N132 Hydronephrosis with renal and ureteral calculous obstruction: Secondary | ICD-10-CM | POA: Diagnosis not present

## 2020-07-09 ENCOUNTER — Telehealth: Payer: Self-pay | Admitting: Neurology

## 2020-07-09 NOTE — Telephone Encounter (Addendum)
I reached out to the pt's daughter [name].  She sts she wanted to call and  update the NP on some new developments. She reports over the weekend a group of teenagers found the pt passed out near her independent living center. She sts the the teenagers helped the pt return to her room where she reported she had been consuming multiple alcoholic beverages.   Pt's daughter reports this type of drinking has been a problem as of late. She sts the pt is under the impression at her visit on 07/11/2020 she will be released to drive and return to her home and live by herself.   Pt's two daughters who will be present at the visit are concerned about the pt returning to this environment.   She sts the pt needs someone to make sure she is taking her medications daily and to help with ADLS, she sts if she does not have assistance her hygiene lacks and she will miss/not take her meds.  Pt will keep appt on 07/11/2020 as scheduled with SS, NP.  Note will be fwd as FYI for up coming appt.

## 2020-07-09 NOTE — Telephone Encounter (Signed)
Pt's daughter [name] on [place] is needing to speak to the RN before the pt is seen on their scheduled appt to discuss changes in the pt's situation. Please advise.

## 2020-07-11 ENCOUNTER — Encounter: Payer: Self-pay | Admitting: Neurology

## 2020-07-11 ENCOUNTER — Ambulatory Visit (INDEPENDENT_AMBULATORY_CARE_PROVIDER_SITE_OTHER): Payer: Medicare Other | Admitting: Neurology

## 2020-07-11 ENCOUNTER — Telehealth: Payer: Self-pay | Admitting: Neurology

## 2020-07-11 VITALS — BP 143/82 | HR 104 | Ht 59.0 in | Wt 79.0 lb

## 2020-07-11 DIAGNOSIS — R569 Unspecified convulsions: Secondary | ICD-10-CM

## 2020-07-11 DIAGNOSIS — I609 Nontraumatic subarachnoid hemorrhage, unspecified: Secondary | ICD-10-CM

## 2020-07-11 NOTE — Patient Instructions (Addendum)
Continue taking Keppra at current dosing Will provide a letter for additional assistance at her living facility Recommend avoid alcohol consumption  Would avoid driving at this point  Please see you primary doctor  See you back in 6 months

## 2020-07-11 NOTE — Progress Notes (Signed)
PATIENT: [name] DOB: [birth year]  REASON FOR VISIT: follow up HISTORY FROM: patient  HISTORY OF PRESENT ILLNESS: Today 07/11/20  HISTORY [name] is a 79 year old female, seen in request by her primary care physician Dr. Asencion Noble for evaluation of seizure, she is accompanied by her daughter [name] at today's visit on January 10, 2020.    I have reviewed and summarized the referring note from the referring physician.  She had past medical history of alcohol dependence, alcoholic liver disease, osteoporosis, she had gradual worsening gait instability, she was found on the floor on day of admission December 18, 2019, was not able to get up, she continued heavy alcohol use until the day of hospital admission, drink a box of wine a week, at baseline, she ambulate with a cane,  There was no seizure activity described during her hospital admission, she was discharged home with Keppra 500 mg twice daily on December 20 2019, 12 tablets, after she ran out of the medications, in the week of December 26, 2019, she suffered 2 episode of sudden onset loss of consciousness, patient was not able to provide details of the event, this was witnessed by her caregiver, who is with her 24/7, she is also receiving home physical therapy  I personally reviewed CT head without contrast on December 29, 2019, no acute abnormality, generalized atrophy, no acute abnormality. On December 19, 2019, stable small volume subarachnoid hemorrhage along the right parietal lobe, significant generalized atrophy, chronic small vessel disease  Laboratory evaluations BMP showed decreased calcium 7.1, sodium 130, normal uric acid, CMP, albumin was 3.0, increased alkaline phosphate 144, potassium 2.7, creatinine 0.65, normal B12, TSH, CPK was 281   Update July 11, 2020 SS: Here today accompanied by her daughter [name], other daughter, [name] is on face time.  Is now living in an independent living community,  "[facility]" in [place], close to her daughter.  Following her hospital discharge in January, had someone in the home with her basically 24 hours a day, had an episode of kidney stones in March, then relocated to the [place] area independent living facility.   Of note, does not take medications consistently, receives pills in pill pack, continues to drink alcohol, has not been driving, can walk to the local grocery store, drinks box wine, unclear amount.  In July, fell off the couch.  Has long history of alcohol abuse.  Over the weekend, teenagers found her passed out near the independent living facility, she had been consuming multiple alcoholic beverages, daughters do not feel she is safe to return to her residence alone in [place], she requires more assistance with ADLs, and medication administration.  When she is drinking, she forgets to eat, weight is 79 lbs. she gets easily frustrated, agitated.  Using walker.  No recurrent seizure spells.  Not taking Keppra consistently.  EEG in February 2021 was normal awake and asleep, no evidence of epileptiform discharge  MRI of the brain in March 2021 showed moderate atrophy, mild small vessel disease, chronic blood products at the right parietal lobe related to previous trauma  REVIEW OF SYSTEMS: Out of a complete 14 system review of symptoms, the patient complains only of the following symptoms, and all other reviewed systems are negative.  See HPI  ALLERGIES: Allergies  Allergen Reactions  . Clindamycin Rash  . Doxycycline Hyclate Rash    HOME MEDICATIONS: Outpatient Medications Prior to Visit  Medication Sig Dispense Refill  . Biotin 2.5 MG TABS Take  1 tablet by mouth daily.     . cephALEXin (KEFLEX) 500 MG capsule Take 1 capsule (500 mg total) by mouth 4 (four) times daily. 40 capsule 0  . cyanocobalamin (,VITAMIN B-12,) 1000 MCG/ML injection Inject 1,000 mcg into the skin every 30 (thirty) days.     . cycloSPORINE (RESTASIS) 0.05 %  ophthalmic emulsion Place 1 drop into both eyes 2 (two) times daily.     Marland Kitchen denosumab (PROLIA) 60 MG/ML SOSY injection Inject 60 mg into the skin every 6 (six) months.    . Escitalopram Oxalate (LEXAPRO PO) Take 10 mg by mouth daily.     Marland Kitchen HYDROcodone-acetaminophen (NORCO/VICODIN) 5-325 MG tablet Take 1 tablet by mouth every 4 (four) hours as needed. 10 tablet 0  . levETIRAcetam (KEPPRA) 500 MG tablet Take 1 tablet (500 mg total) by mouth 2 (two) times daily. 180 tablet 4  . metoprolol tartrate (LOPRESSOR) 25 MG tablet Take 1 tablet (25 mg total) by mouth 2 (two) times daily. 60 tablet 11  . Multiple Vitamin (MULTIVITAMIN) capsule Take 1 capsule by mouth daily.    . ondansetron (ZOFRAN ODT) 4 MG disintegrating tablet Take 1 tablet (4 mg total) by mouth every 8 (eight) hours as needed for nausea or vomiting. 20 tablet 0  . potassium citrate (UROCIT-K) 10 MEQ (1080 MG) SR tablet Take 10 mEq by mouth daily.     Marland Kitchen thiamine 100 MG tablet Take 100 mg by mouth daily.      No facility-administered medications prior to visit.    PAST MEDICAL HISTORY: Past Medical History:  Diagnosis Date  . Anxiety   . Arthritis   . At risk for seizures   . 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 Carpal tunnel syndrome, bilateral   . Depression   . GERD (gastroesophageal reflux disease)   . Kidney stones   . Liver disease   . Osteoporosis   . Subarachnoid bleed ([place])     PAST SURGICAL HISTORY: Past Surgical History:  Procedure Laterality Date  . ABDOMINAL HYSTERECTOMY     vaginal  . APPENDECTOMY    . CARPAL TUNNEL RELEASE  01/21/2012   Procedure: CARPAL TUNNEL RELEASE;  Surgeon: Wynonia Sours, MD;  Location: [facility];  Service: Orthopedics;  Laterality: Right;  . COLON RESECTION  2004   perf bowel after colonoscopy  . CYSTOSCOPY W/ URETERAL STENT PLACEMENT  2010   lt-lazer stone  . CYSTOSCOPY W/ URETERAL STENT PLACEMENT Bilateral 02/29/2020   Procedure: CYSTOSCOPY WITH  BILATERAL RETROGRADE PYELOGRAM;BILATERAL URETERAL STENT PLACEMENT;  Surgeon: Cleon Gustin, MD;  Location: [facility];  Service: Urology;  Laterality: Bilateral;  . CYSTOSCOPY WITH STENT PLACEMENT Bilateral 09/08/2014   Procedure: CYSTOSCOPY WITH STENT PLACEMENT;  Surgeon: Malka So, MD;  Location: [facility];  Service: Urology;  Laterality: Bilateral;  . THORACOTOMY  2007   vatz-rt upper lobe  . TONSILLECTOMY      FAMILY HISTORY: Family History  Problem Relation Age of Onset  . Other Mother        "old age" - died at age 37  . Lung cancer Father     SOCIAL HISTORY: Social History   Socioeconomic History  . Marital status: Widowed    Spouse name: Not on file  . Number of children: 2  . Years of education: college  . Highest education level: Not on file  Occupational History  . Not on file  Tobacco Use  . Smoking status: Former Smoker  Types: Cigarettes    Quit date: 12/01/1996    Years since quitting: 23.6  . Smokeless tobacco: Never Used  Vaping Use  . Vaping Use: Never used  Substance and Sexual Activity  . Alcohol use: Yes    Comment: 10 cartons of wine a week.  . Drug use: No  . Sexual activity: Not on file  Other Topics Concern  . Not on file  Social History Narrative   Lives alone but has caregivers come to her home.   Right-handed.   One cup caffeine per day.   Social Determinants of Health   Financial Resource Strain:   . Difficulty of Paying Living Expenses:   Food Insecurity:   . Worried About Charity fundraiser in the Last Year:   . Arboriculturist in the Last Year:   Transportation Needs:   . Film/video editor (Medical):   Marland Kitchen Lack of Transportation (Non-Medical):   Physical Activity:   . Days of Exercise per Week:   . Minutes of Exercise per Session:   Stress:   . Feeling of Stress :   Social Connections:   . Frequency of Communication with Friends and Family:   . Frequency of Social Gatherings with Friends and Family:   . Attends  Religious Services:   . Active Member of Clubs or Organizations:   . Attends Archivist Meetings:   Marland Kitchen Marital Status:   Intimate Partner Violence:   . Fear of Current or Ex-Partner:   . Emotionally Abused:   Marland Kitchen Physically Abused:   . Sexually Abused:    PHYSICAL EXAM  Vitals:   07/11/20 1420  BP: (!) 143/82  Pulse: (!) 104  Weight: 79 lb (35.8 kg)  Height: 4\' 11"  (1.499 m)   Body mass index is 15.96 kg/m.  Generalized: Well developed, in no acute distress  MMSE - Mini Mental State Exam 07/11/2020  Orientation to time 3  Orientation to Place 2  Registration 3  Attention/ Calculation 5  Recall 2  Language- name 2 objects 2  Language- repeat 1  Language- follow 3 step command 3  Language- read & follow direction 1  Write a sentence 1  Copy design 1  Total score 24    Neurological examination  Mentation: Alert oriented to time, place, history is equally provided by the patient and her daughters. Follows all commands speech and language fluent.  Irritability with exam process, frail appearing Cranial nerve II-XII: Pupils were equal round reactive to light. Extraocular movements were full, visual field were full on confrontational test. Facial sensation and strength were normal. Head turning and shoulder shrug  were normal and symmetric. Motor: Strength is intact all 4 extremities Sensory: Sensory testing is intact to soft touch on all 4 extremities. No evidence of extinction is noted.  Coordination: Cerebellar testing reveals good finger-nose-finger and heel-to-shin bilaterally.  Gait and station: Has to push off from seated position to stand, gait is wide-based, cautious, does not like assistance, uses walker in the hallway Reflexes: Deep tendon reflexes are symmetric and normal bilaterally.   DIAGNOSTIC DATA (LABS, IMAGING, TESTING) - I reviewed patient records, labs, notes, testing and imaging myself where available.  Lab Results  Component Value Date   WBC  9.3 03/05/2020   HGB 9.1 (L) 03/05/2020   HCT 29.1 (L) 03/05/2020   MCV 87.4 03/05/2020   PLT 59 (L) 03/05/2020      Component Value Date/Time   NA 138 03/05/2020 0507  K 3.8 03/05/2020 0507   CL 107 03/05/2020 0507   CO2 23 03/05/2020 0507   GLUCOSE 88 03/05/2020 0507   BUN 10 03/05/2020 0507   CREATININE 0.71 03/05/2020 0507   CALCIUM 7.4 (L) 03/05/2020 0507   PROT 4.9 (L) 03/04/2020 0626   ALBUMIN 2.2 (L) 03/04/2020 0626   AST 16 03/04/2020 0626   ALT 8 03/04/2020 0626   ALKPHOS 60 03/04/2020 0626   BILITOT 0.4 03/04/2020 0626   GFRNONAA >60 03/05/2020 0507   GFRAA >60 03/05/2020 0507   No results found for: CHOL, HDL, LDLCALC, LDLDIRECT, TRIG, CHOLHDL No results found for: HGBA1C Lab Results  Component Value Date   VITAMINB12 3,123 (H) 03/03/2020   Lab Results  Component Value Date   TSH 4.337 12/19/2019    ASSESSMENT AND PLAN 79 y.o. 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 here with:  1.  Heavy alcohol use 2.  Subarachnoid hemorrhage on December 18, 2019 3.  2 episodes of sudden loss of consciousness, likely partial seizure  -EEG in February 2021 was normal awake and asleep, no evidence of epileptiform discharge  -MRI of the brain in March 2021 showed moderate atrophy, mild small vessel disease, chronic blood products at the right parietal lobe related to previous trauma  -No recurrent episodes of sudden loss of consciousness, not taking Keppra consistently, recommend restarting, 500 mg twice a day  -Recommend, she continue to reside at the retirement community, not felt to be safe to return back to her independent residence in [place], will provide a note requesting assistance with daily activities and  medication administration for her current senior living community  -Recommend she no longer drive, given concern for partial seizure and medication noncompliance, along with heavy alcohol use, recent report of being passed out near the retirement community  -Recommend she have a follow-up visit with her primary doctor, for management of chronic conditions, weight is 79 lbs, continued heavy alcohol use, patient agitated today discussing future living and driving situation, daughters are in agreement  -Follow-up in 6 months or sooner if needed  I spent 30 minutes of face-to-face and non-face-to-face time with patient.  This included previsit chart review, lab review, study review, order entry, electronic health record documentation, patient education.  Butler Denmark, AGNP-C, DNP 07/11/2020, 5:06 PM [facility] [address] [place], [place] [address] [phone]

## 2020-07-11 NOTE — Telephone Encounter (Signed)
I wrote a letter requesting assistance with medications and ADLs at her senior living facility. Can you please ensure the daughters have it via my chart or mail.

## 2020-07-12 NOTE — Telephone Encounter (Signed)
I called and spoke to [name], and will email her letter at her email, [email or web address].  (I relayed will be sending this am, look for it, if not seen check spam.  She stated understanding.

## 2020-07-16 DIAGNOSIS — N200 Calculus of kidney: Secondary | ICD-10-CM | POA: Diagnosis not present

## 2020-07-16 DIAGNOSIS — D649 Anemia, unspecified: Secondary | ICD-10-CM | POA: Diagnosis not present

## 2020-07-16 DIAGNOSIS — Z79899 Other long term (current) drug therapy: Secondary | ICD-10-CM | POA: Diagnosis not present

## 2020-07-16 DIAGNOSIS — E440 Moderate protein-calorie malnutrition: Secondary | ICD-10-CM | POA: Diagnosis not present

## 2020-08-02 ENCOUNTER — Encounter (HOSPITAL_COMMUNITY)
Admission: RE | Admit: 2020-08-02 | Discharge: 2020-08-02 | Disposition: A | Discharge status: Home / Self Care | Payer: Medicare Other | Source: Ambulatory Visit | Attending: Internal Medicine | Admitting: Internal Medicine

## 2020-08-24 ENCOUNTER — Encounter (HOSPITAL_COMMUNITY): Payer: Self-pay

## 2020-08-24 ENCOUNTER — Other Ambulatory Visit: Payer: Self-pay

## 2020-08-24 ENCOUNTER — Encounter (HOSPITAL_COMMUNITY)
Admission: RE | Admit: 2020-08-24 | Discharge: 2020-08-24 | Disposition: A | Discharge status: Home / Self Care | Payer: Medicare Other | Source: Ambulatory Visit | Attending: Internal Medicine | Admitting: Internal Medicine

## 2020-08-24 DIAGNOSIS — K30 Functional dyspepsia: Secondary | ICD-10-CM | POA: Diagnosis not present

## 2020-08-24 LAB — POCT I-STAT, CHEM 8
BUN: 9 mg/dL (ref 8–23)
Calcium, Ion: 1.17 mmol/L (ref 1.15–1.40)
Chloride: 105 mmol/L (ref 98–111)
Creatinine, Ser: 1.1 mg/dL — ABNORMAL HIGH (ref 0.44–1.00)
Glucose, Bld: 88 mg/dL (ref 70–99)
HCT: 37 % (ref 36.0–46.0)
Hemoglobin: 12.6 g/dL (ref 12.0–15.0)
Potassium: 3.3 mmol/L — ABNORMAL LOW (ref 3.5–5.1)
Sodium: 139 mmol/L (ref 135–145)
TCO2: 22 mmol/L (ref 22–32)

## 2020-08-24 MED ORDER — DENOSUMAB 60 MG/ML ~~LOC~~ SOSY
60.0000 mg | PREFILLED_SYRINGE | Freq: Once | SUBCUTANEOUS | Status: AC
  Administered 2020-08-24: 60 mg via SUBCUTANEOUS

## 2020-09-03 ENCOUNTER — Emergency Department (HOSPITAL_COMMUNITY)
Admission: EM | Admit: 2020-09-03 | Discharge: 2020-09-03 | Disposition: A | Discharge status: Home / Self Care | Payer: Medicare Other | Attending: Emergency Medicine | Admitting: Emergency Medicine

## 2020-09-03 ENCOUNTER — Other Ambulatory Visit: Payer: Self-pay

## 2020-09-03 ENCOUNTER — Encounter (HOSPITAL_COMMUNITY): Payer: Self-pay | Admitting: Emergency Medicine

## 2020-09-03 ENCOUNTER — Emergency Department (HOSPITAL_COMMUNITY): Payer: Medicare Other

## 2020-09-03 DIAGNOSIS — W010XXA Fall on same level from slipping, tripping and stumbling without subsequent striking against object, initial encounter: Secondary | ICD-10-CM | POA: Diagnosis not present

## 2020-09-03 DIAGNOSIS — E860 Dehydration: Secondary | ICD-10-CM | POA: Diagnosis not present

## 2020-09-03 DIAGNOSIS — S8011XA Contusion of right lower leg, initial encounter: Secondary | ICD-10-CM | POA: Diagnosis not present

## 2020-09-03 DIAGNOSIS — Z87891 Personal history of nicotine dependence: Secondary | ICD-10-CM | POA: Insufficient documentation

## 2020-09-03 DIAGNOSIS — Y92009 Unspecified place in unspecified non-institutional (private) residence as the place of occurrence of the external cause: Secondary | ICD-10-CM | POA: Diagnosis not present

## 2020-09-03 DIAGNOSIS — R000 Tachycardia, unspecified: Secondary | ICD-10-CM | POA: Diagnosis not present

## 2020-09-03 DIAGNOSIS — W19XXXA Unspecified fall, initial encounter: Secondary | ICD-10-CM

## 2020-09-03 DIAGNOSIS — G9389 Other specified disorders of brain: Secondary | ICD-10-CM | POA: Diagnosis not present

## 2020-09-03 DIAGNOSIS — T1490XA Injury, unspecified, initial encounter: Secondary | ICD-10-CM | POA: Diagnosis not present

## 2020-09-03 DIAGNOSIS — S8012XA Contusion of left lower leg, initial encounter: Secondary | ICD-10-CM | POA: Diagnosis not present

## 2020-09-03 DIAGNOSIS — I10 Essential (primary) hypertension: Secondary | ICD-10-CM | POA: Diagnosis not present

## 2020-09-03 DIAGNOSIS — I7090 Unspecified atherosclerosis: Secondary | ICD-10-CM | POA: Diagnosis not present

## 2020-09-03 DIAGNOSIS — S0003XA Contusion of scalp, initial encounter: Secondary | ICD-10-CM

## 2020-09-03 DIAGNOSIS — S0990XA Unspecified injury of head, initial encounter: Secondary | ICD-10-CM | POA: Diagnosis present

## 2020-09-03 LAB — URINALYSIS, ROUTINE W REFLEX MICROSCOPIC
Bilirubin Urine: NEGATIVE
Glucose, UA: NEGATIVE mg/dL
Ketones, ur: 5 mg/dL — AB
Leukocytes,Ua: NEGATIVE
Nitrite: NEGATIVE
Protein, ur: NEGATIVE mg/dL
Specific Gravity, Urine: 1.008 (ref 1.005–1.030)
pH: 5 (ref 5.0–8.0)

## 2020-09-03 LAB — CBC WITH DIFFERENTIAL/PLATELET
Abs Immature Granulocytes: 0.04 10*3/uL (ref 0.00–0.07)
Basophils Absolute: 0 10*3/uL (ref 0.0–0.1)
Basophils Relative: 0 %
Eosinophils Absolute: 0 10*3/uL (ref 0.0–0.5)
Eosinophils Relative: 0 %
HCT: 35.6 % — ABNORMAL LOW (ref 36.0–46.0)
Hemoglobin: 11.2 g/dL — ABNORMAL LOW (ref 12.0–15.0)
Immature Granulocytes: 0 %
Lymphocytes Relative: 8 %
Lymphs Abs: 0.7 10*3/uL (ref 0.7–4.0)
MCH: 29.9 pg (ref 26.0–34.0)
MCHC: 31.5 g/dL (ref 30.0–36.0)
MCV: 95.2 fL (ref 80.0–100.0)
Monocytes Absolute: 0.8 10*3/uL (ref 0.1–1.0)
Monocytes Relative: 9 %
Neutro Abs: 7.8 10*3/uL — ABNORMAL HIGH (ref 1.7–7.7)
Neutrophils Relative %: 83 %
Platelets: 78 10*3/uL — ABNORMAL LOW (ref 150–400)
RBC: 3.74 MIL/uL — ABNORMAL LOW (ref 3.87–5.11)
RDW: 16.4 % — ABNORMAL HIGH (ref 11.5–15.5)
WBC: 9.4 10*3/uL (ref 4.0–10.5)
nRBC: 0 % (ref 0.0–0.2)

## 2020-09-03 LAB — BASIC METABOLIC PANEL
Anion gap: 18 — ABNORMAL HIGH (ref 5–15)
BUN: 16 mg/dL (ref 8–23)
CO2: 19 mmol/L — ABNORMAL LOW (ref 22–32)
Calcium: 8.4 mg/dL — ABNORMAL LOW (ref 8.9–10.3)
Chloride: 95 mmol/L — ABNORMAL LOW (ref 98–111)
Creatinine, Ser: 1.28 mg/dL — ABNORMAL HIGH (ref 0.44–1.00)
GFR calc Af Amer: 46 mL/min — ABNORMAL LOW (ref >60)
GFR calc non Af Amer: 40 mL/min — ABNORMAL LOW (ref >60)
Glucose, Bld: 79 mg/dL (ref 70–99)
Potassium: 4.7 mmol/L (ref 3.5–5.1)
Sodium: 132 mmol/L — ABNORMAL LOW (ref 135–145)

## 2020-09-03 LAB — CK: Total CK: 253 U/L — ABNORMAL HIGH (ref 38–234)

## 2020-09-03 NOTE — ED Provider Notes (Signed)
[facility] EMERGENCY DEPARTMENT Provider Note   CSN: [id number] Arrival date & time: 09/03/20  1234     History Chief Complaint  Patient presents with  . Fall    [name] is a 79 y.o. female.  79 year old female brought in by EMS for evaluation after a fall which she states occurred yesterday.  Patient states that she loses her balance from time to time and fell backwards hitting her head on the floor.  Patient denies loss of consciousness, neck or back pain or any injury to her extremities.  Patient is not anticoagulated.  Patient states that she is just here for a scan of her head.  No other complaints or concerns today.        Past Medical History:  Diagnosis Date  . Anxiety   . Arthritis   . At risk for seizures   . 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 Carpal tunnel syndrome, bilateral   . Depression   . GERD (gastroesophageal reflux disease)   . Kidney stones   . Liver disease   . Osteoporosis   . Subarachnoid bleed [facility])     Patient Active Problem List   Diagnosis Date Noted  . Sepsis ([place]) 02/29/2020  . Acute lower UTI 02/29/2020  . Unsteady gait 01/10/2020  . Seizures ([place]) 01/10/2020  . Left knee pain   . AKI (acute kidney injury) ([place]) 12/19/2019  . Physical deconditioning 12/19/2019  . Alcohol dependence ([place]) 12/19/2019  . Thrombocytopenia ([place]) 12/19/2019  . Subarachnoid hemorrhage ([place]) 12/19/2019  . SAH (subarachnoid hemorrhage) ([place])   . Transaminasemia   . Subarachnoid bleed ([place]) 12/18/2019  . Hypokalemia 12/18/2019  . Depression   . Hyperbilirubinemia   . Hyponatremia   . Macrocytic anemia   . 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[place])   . Arthritis 05/10/2019  . Diarrhea 10/13/2017  . Kidney stone 10/31/2014  . Left ureteral stone 09/08/2014  . Right ureteral stone 09/08/2014  . Infection of urinary tract 09/08/2014  . Pain in joint, shoulder region 08/09/2012  . Muscle weakness (generalized) 08/09/2012  . Impingement  syndrome of left shoulder 08/09/2012    Past Surgical History:  Procedure Laterality Date  . ABDOMINAL HYSTERECTOMY     vaginal  . APPENDECTOMY    . CARPAL TUNNEL RELEASE  01/21/2012   Procedure: CARPAL TUNNEL RELEASE;  Surgeon: Wynonia Sours, MD;  Location: [place];  Service: Orthopedics;  Laterality: Right;  . COLON RESECTION  2004   perf bowel after colonoscopy  . CYSTOSCOPY W/ URETERAL STENT PLACEMENT  2010   lt-lazer stone  . CYSTOSCOPY W/ URETERAL STENT PLACEMENT Bilateral 02/29/2020   Procedure: CYSTOSCOPY WITH BILATERAL RETROGRADE PYELOGRAM;BILATERAL URETERAL STENT PLACEMENT;  Surgeon: Cleon Gustin, MD;  Location: [facility];  Service: Urology;  Laterality: Bilateral;  . CYSTOSCOPY WITH STENT PLACEMENT Bilateral 09/08/2014   Procedure: CYSTOSCOPY WITH STENT PLACEMENT;  Surgeon: Malka So, MD;  Location: [facility];  Service: Urology;  Laterality: Bilateral;  . THORACOTOMY  2007   vatz-rt upper lobe  . TONSILLECTOMY       OB History   No obstetric history on file.     Family History  Problem Relation Age of Onset  . Other Mother        "old age" - died at age 86  . Lung cancer Father     Social History   Tobacco Use  . Smoking status: Former Smoker    Types: Cigarettes  Quit date: 12/01/1996    Years since quitting: 23.7  . Smokeless tobacco: Never Used  Vaping Use  . Vaping Use: Never used  Substance Use Topics  . Alcohol use: Yes    Comment: 5 cartons of wine a week.  . Drug use: No    Home Medications Prior to Admission medications   Medication Sig Start Date End Date Taking? Authorizing Provider  Biotin 2.5 MG TABS Take 1 tablet by mouth daily.     [provider]  cephALEXin (KEFLEX) 500 MG capsule Take 1 capsule (500 mg total) by mouth 4 (four) times daily. 03/05/20   Kathie Dike, MD  cyanocobalamin (,VITAMIN B-12,) 1000 MCG/ML injection Inject 1,000 mcg into the skin every 30 (thirty) days.  03/22/19   [provider]  cycloSPORINE (RESTASIS) 0.05 % ophthalmic emulsion Place 1 drop into both eyes 2 (two) times daily.     [provider]  denosumab (PROLIA) 60 MG/ML SOSY injection Inject 60 mg into the skin every 6 (six) months.    Asencion Noble, MD  Escitalopram Oxalate (LEXAPRO PO) Take 10 mg by mouth daily.     [provider]  HYDROcodone-acetaminophen (NORCO/VICODIN) 5-325 MG tablet Take 1 tablet by mouth every 4 (four) hours as needed. 02/29/20   Rancour, Annie Main, MD  levETIRAcetam (KEPPRA) 500 MG tablet Take 1 tablet (500 mg total) by mouth 2 (two) times daily. 01/10/20   Marcial Pacas, MD  metoprolol tartrate (LOPRESSOR) 25 MG tablet Take 1 tablet (25 mg total) by mouth 2 (two) times daily. 03/05/20 03/05/21  Kathie Dike, MD  Multiple Vitamin (MULTIVITAMIN) capsule Take 1 capsule by mouth daily.    [provider]  ondansetron (ZOFRAN ODT) 4 MG disintegrating tablet Take 1 tablet (4 mg total) by mouth every 8 (eight) hours as needed for nausea or vomiting. 02/29/20   Rancour, Annie Main, MD  potassium citrate (UROCIT-K) 10 MEQ (1080 MG) SR tablet Take 10 mEq by mouth daily.  03/22/19   [provider]  thiamine 100 MG tablet Take 100 mg by mouth daily.     [provider]    Allergies    Clindamycin and Doxycycline hyclate  Review of Systems   Review of Systems  Constitutional: Negative for fever.  Gastrointestinal: Negative for nausea and vomiting.  Musculoskeletal: Negative for back pain and neck pain.  Skin: Negative for wound.  Neurological: Negative for dizziness, weakness and headaches.  Hematological: Does not bruise/bleed easily.  Psychiatric/Behavioral: Negative for confusion.  All other systems reviewed and are negative.   Physical Exam Updated Vital Signs BP (!) 160/100 (BP Location: Left Arm)   Pulse (!) 118   Temp 99.1 F (37.3 C) (Oral)   Resp 16   Ht 4\' 11"  (1.499 m)   Wt 35.8 kg   SpO2 100%   BMI 15.96 kg/m   Physical  Exam Vitals and nursing note reviewed.  Constitutional:      General: She is not in acute distress.    Appearance: She is well-developed. She is not diaphoretic.    HENT:     Head: Normocephalic.     Mouth/Throat:     Mouth: Mucous membranes are moist.  Eyes:     Pupils: Pupils are equal, round, and reactive to light.  Cardiovascular:     Pulses: Normal pulses.  Pulmonary:     Effort: Pulmonary effort is normal.  Abdominal:     Palpations: Abdomen is soft.     Tenderness: There is no  abdominal tenderness.  Musculoskeletal:     Right shoulder: Normal range of motion.     Left shoulder: Normal range of motion.     Right wrist: Normal.     Left wrist: Normal.     Cervical back: Normal. No tenderness or bony tenderness. Normal range of motion.     Thoracic back: No tenderness or bony tenderness.     Lumbar back: No tenderness or bony tenderness.       Back:     Right lower leg: No edema.     Left lower leg: No edema.     Comments: Bruising to bilateral lower legs  Skin:    General: Skin is warm and dry.     Findings: Bruising present. No erythema or rash.  Neurological:     Mental Status: She is alert and oriented to person, place, and time.     Motor: No weakness.  Psychiatric:        Behavior: Behavior normal.     ED Results / Procedures / Treatments   Labs (all labs ordered are listed, but only abnormal results are displayed) Labs Reviewed  BASIC METABOLIC PANEL - Abnormal; Notable for the following components:      Result Value   Sodium 132 (*)    Chloride 95 (*)    CO2 19 (*)    Creatinine, Ser 1.28 (*)    Calcium 8.4 (*)    GFR calc non Af Amer 40 (*)    GFR calc Af Amer 46 (*)    Anion gap 18 (*)    All other components within normal limits  CBC WITH DIFFERENTIAL/PLATELET - Abnormal; Notable for the following components:   RBC 3.74 (*)    Hemoglobin 11.2 (*)    HCT 35.6 (*)    RDW 16.4 (*)    Platelets 78 (*)    Neutro Abs 7.8 (*)    All other  components within normal limits  URINALYSIS, ROUTINE W REFLEX MICROSCOPIC - Abnormal; Notable for the following components:   APPearance HAZY (*)    Hgb urine dipstick MODERATE (*)    Ketones, ur 5 (*)    Bacteria, UA RARE (*)    All other components within normal limits  CK - Abnormal; Notable for the following components:   Total CK 253 (*)    All other components within normal limits    EKG None  Radiology CT Head Wo Contrast  Result Date: 09/03/2020 CLINICAL DATA:  Head trauma. EXAM: CT HEAD WITHOUT CONTRAST TECHNIQUE: Contiguous axial images were obtained from the base of the skull through the vertex without intravenous contrast. COMPARISON:  February 29, 2020 FINDINGS: Brain: No evidence of acute infarction, hemorrhage, hydrocephalus, extra-axial collection or mass lesion/mass effect. Similar mild diffuse cerebral volume loss with ex vacuo ventricular dilation. Similar patchy white matter hypoattenuation, compatible with mild chronic microvascular ischemic disease. Vascular: Calcific atherosclerosis. Skull: High posterior scalp contusion without underlying calvarial fracture. Sinuses/Orbits: The sinuses are clear. No acute orbital abnormality. Other: No mastoid effusions. IMPRESSION: 1. No evidence of acute intracranial abnormality. 2. High posterior scalp contusion without underlying calvarial fracture. Electronically Signed   By: Margaretha Sheffield MD   On: 09/03/2020 16:39    Procedures Procedures (including critical care time)  Medications Ordered in ED Medications - No data to display  ED Course  I have reviewed the triage vital signs and the nursing notes.  Pertinent labs & imaging results that were available during my care of the  patient were reviewed by me and considered in my medical decision making (see chart for details).  Clinical Course as of Sep 03 2040  Mon Sep 03, 2020  1846 79 yo female presents for evaluation after a fall which occurred yesterday. Patient allowed  for limited exam today, states she would like to be discharged and declined further workup. Call to [facility], home health provider who returns to the ER, states she is concerned about worsening gait/balance. Davy Pique states patient was found lying on the floor today at home when agency arrived, patient states she was lying on the floor for a reason, she just can't recall why but it has nothing to do with the fall she is being seen for.  Patient's daughter was contacted, she has spoken with the patient who is agreeable to lab work up today, will check CBC, BMP, CK and UA.    [LM]  1848 CBC without significant changes from baseline.  BMP with slight increase in patient's creatinine to 1.28 today, sodium of 132.  CK mildly elevated at 253. Patient appears dehydrated, is drinking fluids eagerly and without difficulty.  Urinalysis is pending.   [LM]  2033 Patient insists on discharge home today. She has been ambulatory to the bathroom with minimal assistance, states she ambulates with a walker at home and will do so once discharged, also has a call bell she wears and agrees to call the next time she has a fall.  Discussed with Dr. Melina Copa who agrees with plan for dc home at patient's request.    [LM]    Clinical Course User Index [LM] Roque Lias   MDM Rules/Calculators/A&P                          Final Clinical Impression(s) / ED Diagnoses Final diagnoses:  Fall in home, initial encounter  Contusion of scalp, initial encounter  Dehydration    Rx / DC Orders ED Discharge Orders    None       Roque Lias 09/03/20 2041    Hayden Rasmussen, MD 09/04/20 1040

## 2020-09-03 NOTE — Discharge Instructions (Addendum)
Home to rest. Drink plenty of hydrating fluids. Follow up with your doctor.

## 2020-09-03 NOTE — ED Triage Notes (Signed)
Pt arrived to [facility] via [facility]. Pt tripped and fell at home today.

## 2020-12-10 ENCOUNTER — Encounter: Payer: Self-pay | Admitting: Neurology

## 2020-12-10 ENCOUNTER — Ambulatory Visit (INDEPENDENT_AMBULATORY_CARE_PROVIDER_SITE_OTHER): Payer: Medicare Other | Admitting: Neurology

## 2020-12-10 VITALS — BP 177/91 | HR 63 | Ht 59.0 in | Wt 88.5 lb

## 2020-12-10 DIAGNOSIS — Z8679 Personal history of other diseases of the circulatory system: Secondary | ICD-10-CM

## 2020-12-10 DIAGNOSIS — R404 Transient alteration of awareness: Secondary | ICD-10-CM | POA: Diagnosis not present

## 2020-12-10 DIAGNOSIS — R269 Unspecified abnormalities of gait and mobility: Secondary | ICD-10-CM | POA: Diagnosis not present

## 2020-12-10 NOTE — Progress Notes (Signed)
PATIENT: [name] DOB: 1941/11/11  Chief Complaint  Patient presents with  . Seizures    Rm 16 dgtr- [name] "unsure if she is taking medicines daily, gets bubble packs; denies seizures; losing balance"     HISTORICAL  [name] is a 80 year old female, seen in request by her primary care physician Dr. Asencion Noble for evaluation of seizure, she is accompanied by her daughter [name] at today's visit on January 10, 2020.    I have reviewed and summarized the referring note from the referring physician.  She had past medical history of alcohol dependence, alcoholic liver disease, osteoporosis, she had gradual worsening gait instability, she was found on the floor on day of admission December 18, 2019, was not able to get up, she continued heavy alcohol use until the day of hospital admission, drink a box of wine a week, at baseline, she ambulate with a cane,  There was no seizure activity described during her hospital admission, she was discharged home with Keppra 500 mg twice daily on December 20 2019, 12 tablets, after she ran out of the medications, in the week of December 26, 2019, she suffered 2 episode of sudden onset loss of consciousness, patient was not able to provide details of the event, this was witnessed by her caregiver, who is with her 24/7, she is also receiving home physical therapy  I personally reviewed CT head without contrast on December 29, 2019, no acute abnormality, generalized atrophy, no acute abnormality. On December 19, 2019, stable small volume subarachnoid hemorrhage along the right parietal lobe, significant generalized atrophy, chronic small vessel disease  Laboratory evaluations BMP showed decreased calcium 7.1, sodium 130, normal uric acid, CMP, albumin was 3.0, increased alkaline phosphate 144, potassium 2.7, creatinine 0.65, normal B12, TSH, CPK was 281  UPDATE Dec 10 2020: She is accompanied by her daughter [name] at today's clinical visit,  her other daughter [name] is connected through video call,  She was put into to the assistant living for short while, but it was difficult for her to comply with the facility rule, now she is back home, living alone, has caregiver to check on her about 3 hours each day  Today she is very frustrated about her ability to drive, despite the fact that she cannot have the caregiver taking her grocery shopping as she wished, she no longer has passing out spells, daughter concerned about her not eating well,  We personally reviewed MRI of the brain with without contrast in March 2021, and also repeat CT head October 2021, moderate atrophy, mild supratentorial small vessel disease, right parietal lobe small amount of chronic blood product, no acute abnormality   She is taking her medication with prepacked medicine bubble, has history of noncompliance, continues to have gait abnormality, fell occasionally  REVIEW OF SYSTEMS: Full 14 system review of systems performed and notable only for as above All other review of systems were negative.  ALLERGIES: Allergies  Allergen Reactions  . Clindamycin Rash  . Doxycycline Hyclate Rash    HOME MEDICATIONS: Current Outpatient Medications  Medication Sig Dispense Refill  . Biotin 2.5 MG TABS Take 1 tablet by mouth daily.     . cyanocobalamin (,VITAMIN B-12,) 1000 MCG/ML injection Inject 1,000 mcg into the skin every 30 (thirty) days.     . cycloSPORINE (RESTASIS) 0.05 % ophthalmic emulsion Place 1 drop into both eyes 2 (two) times daily.     Marland Kitchen denosumab (PROLIA) 60 MG/ML SOSY injection Inject 60  mg into the skin every 6 (six) months.    . Escitalopram Oxalate (LEXAPRO PO) Take 10 mg by mouth daily.     Marland Kitchen HYDROcodone-acetaminophen (NORCO/VICODIN) 5-325 MG tablet Take 1 tablet by mouth every 4 (four) hours as needed. 10 tablet 0  . levETIRAcetam (KEPPRA) 500 MG tablet Take 1 tablet (500 mg total) by mouth 2 (two) times daily. 180 tablet 4  . metoprolol tartrate  (LOPRESSOR) 25 MG tablet Take 1 tablet (25 mg total) by mouth 2 (two) times daily. 60 tablet 11  . Multiple Vitamin (MULTIVITAMIN) capsule Take 1 capsule by mouth daily.    . ondansetron (ZOFRAN ODT) 4 MG disintegrating tablet Take 1 tablet (4 mg total) by mouth every 8 (eight) hours as needed for nausea or vomiting. 20 tablet 0  . potassium citrate (UROCIT-K) 10 MEQ (1080 MG) SR tablet Take 10 mEq by mouth daily.     Marland Kitchen thiamine 100 MG tablet Take 100 mg by mouth daily.     . cephALEXin (KEFLEX) 500 MG capsule Take 1 capsule (500 mg total) by mouth 4 (four) times daily. (Patient not taking: Reported on 12/10/2020) 40 capsule 0   No current facility-administered medications for this visit.    PAST MEDICAL HISTORY: Past Medical History:  Diagnosis Date  . Anxiety   . Arthritis   . At risk for seizures   . 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 Carpal tunnel syndrome, bilateral   . Depression   . GERD (gastroesophageal reflux disease)   . Kidney stones   . Liver disease   . Osteoporosis   . Subarachnoid bleed ([place])     PAST SURGICAL HISTORY: Past Surgical History:  Procedure Laterality Date  . ABDOMINAL HYSTERECTOMY     vaginal  . APPENDECTOMY    . CARPAL TUNNEL RELEASE  01/21/2012   Procedure: CARPAL TUNNEL RELEASE;  Surgeon: Wynonia Sours, MD;  Location: [facility];  Service: Orthopedics;  Laterality: Right;  . COLON RESECTION  2004   perf bowel after colonoscopy  . CYSTOSCOPY W/ URETERAL STENT PLACEMENT  2010   lt-lazer stone  . CYSTOSCOPY W/ URETERAL STENT PLACEMENT Bilateral 02/29/2020   Procedure: CYSTOSCOPY WITH BILATERAL RETROGRADE PYELOGRAM;BILATERAL URETERAL STENT PLACEMENT;  Surgeon: Cleon Gustin, MD;  Location[facility];  Service: Urology;  Laterality: Bilateral;  . CYSTOSCOPY WITH STENT PLACEMENT Bilateral 09/08/2014   Procedure: CYSTOSCOPY WITH STENT PLACEMENT;  Surgeon: Malka So, MD;  Location[facility];  Service: Urology;   Laterality: Bilateral;  . THORACOTOMY  2007   vatz-rt upper lobe  . TONSILLECTOMY      FAMILY HISTORY: Family History  Problem Relation Age of Onset  . Other Mother        "old age" - died at age 94  . Lung cancer Father     SOCIAL HISTORY: Social History   Socioeconomic History  . Marital status: Widowed    Spouse name: Not on file  . Number of children: 2  . Years of education: college  . Highest education level: Not on file  Occupational History  . Not on file  Tobacco Use  . Smoking status: Former Smoker    Types: Cigarettes    Quit date: 12/01/1996    Years since quitting: 24.0  . Smokeless tobacco: Never Used  Vaping Use  . Vaping Use: Never used  Substance and Sexual Activity  . Alcohol use: Yes    Comment: 5 cartons of wine a week.  Marland Kitchen  Drug use: No  . Sexual activity: Not on file  Other Topics Concern  . Not on file  Social History Narrative   12/10/20 Lives alone but has caregivers come to her home.   Right-handed.   One cup caffeine per day.   Social Determinants of Health   Financial Resource Strain: Not on file  Food Insecurity: Not on file  Transportation Needs: Not on file  Physical Activity: Not on file  Stress: Not on file  Social Connections: Not on file  Intimate Partner Violence: Not on file     PHYSICAL EXAM   Vitals:   12/10/20 0735  BP: (!) 177/91  Pulse: 63  Weight: 88 lb 8 oz (40.1 kg)  Height: 4\' 11"  (1.499 m)   Not recorded     Body mass index is 17.87 kg/m.  PHYSICAL EXAMNIATION:  Gen: NAD, conversant, well nourised, well groomed                     Cardiovascular: Regular rate rhythm, no peripheral edema, warm, nontender. Eyes: Conjunctivae clear without exudates or hemorrhage Neck: Supple, no carotid bruits. Pulmonary: Clear to auscultation bilaterally   NEUROLOGICAL EXAM: Fragile, rely on her daughter to provide history,  MMSE - Mini Mental State Exam 12/10/2020 07/11/2020  Orientation to time 4 3   Orientation to Place 5 2  Registration 3 3  Attention/ Calculation 5 5  Recall 3 2  Language- name 2 objects 2 2  Language- repeat 1 1  Language- follow 3 step command 3 3  Language- read & follow direction 1 1  Write a sentence 1 1  Copy design 1 1  Total score 29 24   CRANIAL NERVES: CN II: Visual fields are full to confrontation. Pupils are round equal and briskly reactive to light. CN III, IV, VI: extraocular movement are normal. No ptosis. CN V: Facial sensation is intact to light touch CN VII: Face is symmetric with normal eye closure  CN VIII: Hearing is normal to causal conversation. CN IX, X: Phonation is normal. CN XI: Head turning and shoulder shrug are intact  MOTOR: There is no pronator drift of out-stretched arms. Muscle bulk and tone are normal. Muscle strength is normal.  REFLEXES: Reflexes are 2+ and symmetric at the biceps, triceps, knees, and ankles. Plantar responses are flexor.  SENSORY: Intact to light touch, pinprick and vibratory sensation are intact in fingers and toes.  COORDINATION: No limb dysmetria, slight truncal ataxia  GAIT/STANCE: She needs to push up to get up from seated position, wide-based, cautious   DIAGNOSTIC DATA (LABS, IMAGING, TESTING) - I reviewed patient records, labs, notes, testing and imaging myself where available.   ASSESSMENT AND PLAN  DEMAYA HARDGE is a 80 y.o. female   History of heavy alcohol use,  Still drink occasionally, but much smaller amount  History of subarachnoid hemorrhage on December 18, 2019,  2 episode of sudden loss of consciousness in January 2021  Unwitnessed, differentiation diagnoses include partial seizure, alcohol toxicity, hyponatremia   She has not been compliant with her Keppra, we decided to stop Keppra,  EEG was normal in February 2021  With witnessed frustration, difficulty with impulse control, gait abnormality, daughter is worried about her ability to operate any vehicle, we had  lengthy discussion with patient and her two daughters, suggested her not driving, she does have caregiver 3 hours each day,  Gait abnormality  MRI of the brain showed significant atrophy of superior cerebellum, can  be related to her long-term alcohol abuse, aging, deconditioning play a role as well  Referred her to home physical therapy   Marcial Pacas, M.D. Ph.D.  [facility] [address] [place], [place] [address] Ph: [phone] Fax: [phone]  CC: Asencion Noble, MD

## 2020-12-24 ENCOUNTER — Telehealth: Payer: Self-pay | Admitting: Neurology

## 2020-12-24 NOTE — Telephone Encounter (Signed)
I spoke to her daughter who states they have not fully provided details about her mother's memory difficulty. The problem is mostly with short-term memory. She has issues with forgetfulness and retaining information.   Her dgt gives examples of repeatedly asking the same questions during brief conversations. She will also go to the grocery store and purchase duplicate items. She sometimes forgets what she buys before even getting to the car and has to go through the bags prior to leaving the parking lot.  Her caregivers feel this has slowly become worse. Her daughter is asking if a medication can be started to slow this progression.

## 2020-12-24 NOTE — Telephone Encounter (Signed)
Pt.'s daughter [name] is on [place]. She states she wants to speak with RN regarding short term memory issues that mom is experiencing. Please advise.

## 2020-12-24 NOTE — Addendum Note (Signed)
Addended by: Desmond Lope on: 12/24/2020 03:53 PM   Modules accepted: Orders

## 2020-12-25 NOTE — Telephone Encounter (Signed)
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

## 2021-01-08 ENCOUNTER — Ambulatory Visit: Payer: Medicare Other | Admitting: Neurology

## 2021-01-25 DIAGNOSIS — D519 Vitamin B12 deficiency anemia, unspecified: Secondary | ICD-10-CM | POA: Diagnosis not present

## 2021-02-21 ENCOUNTER — Encounter (HOSPITAL_COMMUNITY): Admission: RE | Admit: 2021-02-21 | Payer: Medicare Other | Source: Ambulatory Visit

## 2021-02-26 ENCOUNTER — Other Ambulatory Visit: Payer: Self-pay

## 2021-02-26 ENCOUNTER — Encounter (HOSPITAL_COMMUNITY)
Admission: RE | Admit: 2021-02-26 | Discharge: 2021-02-26 | Disposition: A | Discharge status: Home / Self Care | Payer: Medicare Other | Source: Ambulatory Visit | Attending: Internal Medicine | Admitting: Internal Medicine

## 2021-02-26 ENCOUNTER — Encounter (HOSPITAL_COMMUNITY): Payer: Self-pay

## 2021-02-26 DIAGNOSIS — M810 Age-related osteoporosis without current pathological fracture: Secondary | ICD-10-CM | POA: Diagnosis not present

## 2021-02-26 MED ORDER — DENOSUMAB 60 MG/ML ~~LOC~~ SOSY
PREFILLED_SYRINGE | SUBCUTANEOUS | Status: AC
  Filled 2021-02-26: qty 1

## 2021-02-26 MED ORDER — DENOSUMAB 60 MG/ML ~~LOC~~ SOSY
60.0000 mg | PREFILLED_SYRINGE | Freq: Once | SUBCUTANEOUS | Status: AC
  Administered 2021-02-26: 60 mg via SUBCUTANEOUS

## 2021-02-27 ENCOUNTER — Telehealth: Payer: Self-pay | Admitting: *Deleted

## 2021-02-27 NOTE — Telephone Encounter (Addendum)
I spoke to her daughter who is in agreement that the patient should no longer drive. However, she has been calling daily asking for her keys. Says she has repeatedly been informed it is no longer safe. She will provide this information to her again. Her mother may wish to hear it directly from [place]. I offered to call the patient but her daughter would like to wait until they are together. If needed, she will call us back next week while they are visiting with each other.

## 2021-02-27 NOTE — Telephone Encounter (Signed)
Pt's daughter is asking for a call back from [place], [place] @ [phone]

## 2021-02-27 NOTE — Telephone Encounter (Signed)
Left message requesting a call back from [place].

## 2021-02-27 NOTE — Telephone Encounter (Addendum)
Reviewed patient's chart in Epic. The following was noted at most recent visit on 12/10/20 (based on patient and daughter's reports):  Pt was in assisted living for a short period but it was difficult for her to comply with the facility rules. MMSE 29/30. Hx of loss of consciousness observed by her caregiver. Non-compliance with medication. Witnessed frustration, difficulty with impulse control and gait abnormality. Daughter stated she was worried about the patient's ability to operate a motor vehicle.   ___________________________________________  Her daughter later made a call to our office reporting worsening short-term memory loss and forgetfulness. She gave examples of repeatedly asking the same questions during brief conversations. She will also go to the grocery store and purchase duplicate items. She sometimes forgets what she buys before even getting to the car and has to go through the bags prior to leaving the parking lot.  ___________________________________________  Note in Epic from Dr. Krista Blue: Patient's current gradual decline functional status likely due to combination of long-term alcohol use, aging, subarachnoid hemorrhage. 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  ___________________________________________   Dr. Krista Blue has also reviewed this information. We only see the patient 1-2 times per year. A large part of [place] advising against driving comes from the patient's family observations. Based on the reports, we are unable to recommend that driving is safe for her.

## 2021-02-27 NOTE — Telephone Encounter (Signed)
Received the following mychart message for the patient's daughter:  Dr. Krista Blue,  I accompanied my mother, [name], to her Neuro appointment on 12/10/20. Based on her history, current condition, your exam and conversation you suggested she not drive. She wants to know what she needs to do/accomplish from this point forward for you to recommend she be able to drive. Can we arrange a call (with you or your NP, Butler Denmark) or get a plan in writing about steps she may be able to take in order to start driving again?   [place], [place]  [phone]

## 2021-03-08 DIAGNOSIS — D519 Vitamin B12 deficiency anemia, unspecified: Secondary | ICD-10-CM | POA: Diagnosis not present

## 2021-05-17 ENCOUNTER — Ambulatory Visit: Payer: Self-pay

## 2021-05-18 ENCOUNTER — Inpatient Hospital Stay (HOSPITAL_COMMUNITY)
Admission: EM | Admit: 2021-05-18 | Discharge: 2021-05-24 | DRG: 557 | Disposition: A | Discharge status: Skilled Nursing Facility | Payer: Medicare Other | Attending: Family Medicine | Admitting: Family Medicine

## 2021-05-18 ENCOUNTER — Ambulatory Visit: Payer: Self-pay

## 2021-05-18 ENCOUNTER — Emergency Department (HOSPITAL_COMMUNITY): Payer: Medicare Other

## 2021-05-18 ENCOUNTER — Other Ambulatory Visit: Payer: Self-pay

## 2021-05-18 DIAGNOSIS — T1490XA Injury, unspecified, initial encounter: Secondary | ICD-10-CM

## 2021-05-18 DIAGNOSIS — Z79899 Other long term (current) drug therapy: Secondary | ICD-10-CM

## 2021-05-18 DIAGNOSIS — S0010XA Contusion of unspecified eyelid and periocular area, initial encounter: Secondary | ICD-10-CM | POA: Diagnosis not present

## 2021-05-18 DIAGNOSIS — D381 Neoplasm of uncertain behavior of trachea, bronchus and lung: Secondary | ICD-10-CM | POA: Diagnosis present

## 2021-05-18 DIAGNOSIS — I2510 Atherosclerotic heart disease of native coronary artery without angina pectoris: Secondary | ICD-10-CM | POA: Diagnosis not present

## 2021-05-18 DIAGNOSIS — M6282 Rhabdomyolysis: Secondary | ICD-10-CM | POA: Diagnosis present

## 2021-05-18 DIAGNOSIS — R609 Edema, unspecified: Secondary | ICD-10-CM | POA: Diagnosis not present

## 2021-05-18 DIAGNOSIS — M810 Age-related osteoporosis without current pathological fracture: Secondary | ICD-10-CM | POA: Diagnosis present

## 2021-05-18 DIAGNOSIS — R778 Other specified abnormalities of plasma proteins: Secondary | ICD-10-CM | POA: Diagnosis present

## 2021-05-18 DIAGNOSIS — T796XXD Traumatic ischemia of muscle, subsequent encounter: Secondary | ICD-10-CM | POA: Diagnosis not present

## 2021-05-18 DIAGNOSIS — I130 Hypertensive heart and chronic kidney disease with heart failure and stage 1 through stage 4 chronic kidney disease, or unspecified chronic kidney disease: Secondary | ICD-10-CM | POA: Diagnosis present

## 2021-05-18 DIAGNOSIS — I5181 Takotsubo syndrome: Secondary | ICD-10-CM | POA: Diagnosis present

## 2021-05-18 DIAGNOSIS — R4189 Other symptoms and signs involving cognitive functions and awareness: Secondary | ICD-10-CM | POA: Diagnosis present

## 2021-05-18 DIAGNOSIS — R531 Weakness: Secondary | ICD-10-CM | POA: Diagnosis not present

## 2021-05-18 DIAGNOSIS — N183 Chronic kidney disease, stage 3 unspecified: Secondary | ICD-10-CM | POA: Diagnosis present

## 2021-05-18 DIAGNOSIS — I340 Nonrheumatic mitral (valve) insufficiency: Secondary | ICD-10-CM | POA: Diagnosis not present

## 2021-05-18 DIAGNOSIS — D539 Nutritional anemia, unspecified: Secondary | ICD-10-CM | POA: Diagnosis present

## 2021-05-18 DIAGNOSIS — Z881 Allergy status to other antibiotic agents status: Secondary | ICD-10-CM

## 2021-05-18 DIAGNOSIS — R739 Hyperglycemia, unspecified: Secondary | ICD-10-CM | POA: Diagnosis present

## 2021-05-18 DIAGNOSIS — I429 Cardiomyopathy, unspecified: Secondary | ICD-10-CM | POA: Diagnosis not present

## 2021-05-18 DIAGNOSIS — E778 Other disorders of glycoprotein metabolism: Secondary | ICD-10-CM | POA: Diagnosis present

## 2021-05-18 DIAGNOSIS — J90 Pleural effusion, not elsewhere classified: Secondary | ICD-10-CM | POA: Diagnosis not present

## 2021-05-18 DIAGNOSIS — I5023 Acute on chronic systolic (congestive) heart failure: Secondary | ICD-10-CM

## 2021-05-18 DIAGNOSIS — E441 Mild protein-calorie malnutrition: Secondary | ICD-10-CM | POA: Diagnosis present

## 2021-05-18 DIAGNOSIS — I700 Atherosclerosis of aorta: Secondary | ICD-10-CM | POA: Diagnosis not present

## 2021-05-18 DIAGNOSIS — D6959 Other secondary thrombocytopenia: Secondary | ICD-10-CM | POA: Diagnosis present

## 2021-05-18 DIAGNOSIS — Z23 Encounter for immunization: Secondary | ICD-10-CM | POA: Diagnosis not present

## 2021-05-18 DIAGNOSIS — E038 Other specified hypothyroidism: Secondary | ICD-10-CM | POA: Diagnosis present

## 2021-05-18 DIAGNOSIS — M47816 Spondylosis without myelopathy or radiculopathy, lumbar region: Secondary | ICD-10-CM | POA: Diagnosis not present

## 2021-05-18 DIAGNOSIS — E538 Deficiency of other specified B group vitamins: Secondary | ICD-10-CM | POA: Diagnosis present

## 2021-05-18 DIAGNOSIS — R079 Chest pain, unspecified: Secondary | ICD-10-CM

## 2021-05-18 DIAGNOSIS — S81812A Laceration without foreign body, left lower leg, initial encounter: Secondary | ICD-10-CM | POA: Diagnosis present

## 2021-05-18 DIAGNOSIS — Z7189 Other specified counseling: Secondary | ICD-10-CM | POA: Diagnosis not present

## 2021-05-18 DIAGNOSIS — N390 Urinary tract infection, site not specified: Secondary | ICD-10-CM | POA: Diagnosis present

## 2021-05-18 DIAGNOSIS — R748 Abnormal levels of other serum enzymes: Secondary | ICD-10-CM

## 2021-05-18 DIAGNOSIS — S2242XA Multiple fractures of ribs, left side, initial encounter for closed fracture: Secondary | ICD-10-CM | POA: Diagnosis present

## 2021-05-18 DIAGNOSIS — R7401 Elevation of levels of liver transaminase levels: Secondary | ICD-10-CM | POA: Diagnosis present

## 2021-05-18 DIAGNOSIS — I5A Non-ischemic myocardial injury (non-traumatic): Secondary | ICD-10-CM | POA: Diagnosis not present

## 2021-05-18 DIAGNOSIS — Z789 Other specified health status: Secondary | ICD-10-CM | POA: Diagnosis present

## 2021-05-18 DIAGNOSIS — N200 Calculus of kidney: Secondary | ICD-10-CM | POA: Diagnosis not present

## 2021-05-18 DIAGNOSIS — F10239 Alcohol dependence with withdrawal, unspecified: Secondary | ICD-10-CM | POA: Diagnosis present

## 2021-05-18 DIAGNOSIS — S3991XA Unspecified injury of abdomen, initial encounter: Secondary | ICD-10-CM | POA: Diagnosis not present

## 2021-05-18 DIAGNOSIS — K439 Ventral hernia without obstruction or gangrene: Secondary | ICD-10-CM | POA: Diagnosis present

## 2021-05-18 DIAGNOSIS — R319 Hematuria, unspecified: Secondary | ICD-10-CM | POA: Diagnosis present

## 2021-05-18 DIAGNOSIS — Z8673 Personal history of transient ischemic attack (TIA), and cerebral infarction without residual deficits: Secondary | ICD-10-CM

## 2021-05-18 DIAGNOSIS — S299XXA Unspecified injury of thorax, initial encounter: Secondary | ICD-10-CM | POA: Diagnosis not present

## 2021-05-18 DIAGNOSIS — E860 Dehydration: Secondary | ICD-10-CM | POA: Diagnosis not present

## 2021-05-18 DIAGNOSIS — I083 Combined rheumatic disorders of mitral, aortic and tricuspid valves: Secondary | ICD-10-CM | POA: Diagnosis present

## 2021-05-18 DIAGNOSIS — T148XXA Other injury of unspecified body region, initial encounter: Secondary | ICD-10-CM | POA: Diagnosis present

## 2021-05-18 DIAGNOSIS — E162 Hypoglycemia, unspecified: Secondary | ICD-10-CM | POA: Diagnosis not present

## 2021-05-18 DIAGNOSIS — S0012XA Contusion of left eyelid and periocular area, initial encounter: Secondary | ICD-10-CM | POA: Diagnosis present

## 2021-05-18 DIAGNOSIS — M4186 Other forms of scoliosis, lumbar region: Secondary | ICD-10-CM | POA: Diagnosis not present

## 2021-05-18 DIAGNOSIS — Z20822 Contact with and (suspected) exposure to covid-19: Secondary | ICD-10-CM | POA: Diagnosis present

## 2021-05-18 DIAGNOSIS — E43 Unspecified severe protein-calorie malnutrition: Secondary | ICD-10-CM | POA: Diagnosis present

## 2021-05-18 DIAGNOSIS — I426 Alcoholic cardiomyopathy: Secondary | ICD-10-CM

## 2021-05-18 DIAGNOSIS — G319 Degenerative disease of nervous system, unspecified: Secondary | ICD-10-CM

## 2021-05-18 DIAGNOSIS — K219 Gastro-esophageal reflux disease without esophagitis: Secondary | ICD-10-CM | POA: Diagnosis present

## 2021-05-18 DIAGNOSIS — I10 Essential (primary) hypertension: Secondary | ICD-10-CM | POA: Diagnosis not present

## 2021-05-18 DIAGNOSIS — R404 Transient alteration of awareness: Secondary | ICD-10-CM | POA: Diagnosis not present

## 2021-05-18 DIAGNOSIS — N179 Acute kidney failure, unspecified: Secondary | ICD-10-CM | POA: Diagnosis present

## 2021-05-18 DIAGNOSIS — W19XXXA Unspecified fall, initial encounter: Secondary | ICD-10-CM | POA: Diagnosis present

## 2021-05-18 DIAGNOSIS — K709 Alcoholic liver disease, unspecified: Secondary | ICD-10-CM | POA: Diagnosis present

## 2021-05-18 DIAGNOSIS — E440 Moderate protein-calorie malnutrition: Secondary | ICD-10-CM | POA: Insufficient documentation

## 2021-05-18 DIAGNOSIS — I361 Nonrheumatic tricuspid (valve) insufficiency: Secondary | ICD-10-CM | POA: Diagnosis not present

## 2021-05-18 DIAGNOSIS — R931 Abnormal findings on diagnostic imaging of heart and coronary circulation: Secondary | ICD-10-CM | POA: Diagnosis present

## 2021-05-18 DIAGNOSIS — S0011XA Contusion of right eyelid and periocular area, initial encounter: Secondary | ICD-10-CM | POA: Diagnosis not present

## 2021-05-18 DIAGNOSIS — F1027 Alcohol dependence with alcohol-induced persisting dementia: Secondary | ICD-10-CM | POA: Diagnosis present

## 2021-05-18 DIAGNOSIS — R296 Repeated falls: Secondary | ICD-10-CM | POA: Diagnosis present

## 2021-05-18 DIAGNOSIS — K429 Umbilical hernia without obstruction or gangrene: Secondary | ICD-10-CM | POA: Diagnosis not present

## 2021-05-18 DIAGNOSIS — I509 Heart failure, unspecified: Secondary | ICD-10-CM | POA: Diagnosis not present

## 2021-05-18 DIAGNOSIS — Z681 Body mass index (BMI) 19 or less, adult: Secondary | ICD-10-CM | POA: Diagnosis not present

## 2021-05-18 DIAGNOSIS — S3993XA Unspecified injury of pelvis, initial encounter: Secondary | ICD-10-CM | POA: Diagnosis not present

## 2021-05-18 DIAGNOSIS — D696 Thrombocytopenia, unspecified: Secondary | ICD-10-CM

## 2021-05-18 DIAGNOSIS — S2242XD Multiple fractures of ribs, left side, subsequent encounter for fracture with routine healing: Secondary | ICD-10-CM | POA: Diagnosis not present

## 2021-05-18 DIAGNOSIS — T796XXA Traumatic ischemia of muscle, initial encounter: Secondary | ICD-10-CM

## 2021-05-18 DIAGNOSIS — R0689 Other abnormalities of breathing: Secondary | ICD-10-CM | POA: Diagnosis not present

## 2021-05-18 DIAGNOSIS — R8281 Pyuria: Secondary | ICD-10-CM | POA: Diagnosis present

## 2021-05-18 DIAGNOSIS — E871 Hypo-osmolality and hyponatremia: Secondary | ICD-10-CM | POA: Diagnosis present

## 2021-05-18 DIAGNOSIS — Z801 Family history of malignant neoplasm of trachea, bronchus and lung: Secondary | ICD-10-CM

## 2021-05-18 DIAGNOSIS — R000 Tachycardia, unspecified: Secondary | ICD-10-CM | POA: Diagnosis not present

## 2021-05-18 DIAGNOSIS — E876 Hypokalemia: Secondary | ICD-10-CM | POA: Diagnosis present

## 2021-05-18 DIAGNOSIS — M419 Scoliosis, unspecified: Secondary | ICD-10-CM | POA: Diagnosis not present

## 2021-05-18 DIAGNOSIS — Z9181 History of falling: Secondary | ICD-10-CM

## 2021-05-18 DIAGNOSIS — M5030 Other cervical disc degeneration, unspecified cervical region: Secondary | ICD-10-CM | POA: Diagnosis present

## 2021-05-18 DIAGNOSIS — S81811A Laceration without foreign body, right lower leg, initial encounter: Secondary | ICD-10-CM | POA: Diagnosis present

## 2021-05-18 DIAGNOSIS — W19XXXD Unspecified fall, subsequent encounter: Secondary | ICD-10-CM | POA: Diagnosis not present

## 2021-05-18 DIAGNOSIS — R7989 Other specified abnormal findings of blood chemistry: Secondary | ICD-10-CM | POA: Diagnosis present

## 2021-05-18 DIAGNOSIS — Z515 Encounter for palliative care: Secondary | ICD-10-CM | POA: Diagnosis not present

## 2021-05-18 DIAGNOSIS — I351 Nonrheumatic aortic (valve) insufficiency: Secondary | ICD-10-CM | POA: Diagnosis not present

## 2021-05-18 DIAGNOSIS — Z043 Encounter for examination and observation following other accident: Secondary | ICD-10-CM | POA: Diagnosis not present

## 2021-05-18 DIAGNOSIS — Z87891 Personal history of nicotine dependence: Secondary | ICD-10-CM

## 2021-05-18 DIAGNOSIS — D72829 Elevated white blood cell count, unspecified: Secondary | ICD-10-CM

## 2021-05-18 DIAGNOSIS — F4321 Adjustment disorder with depressed mood: Secondary | ICD-10-CM | POA: Diagnosis present

## 2021-05-18 DIAGNOSIS — Z888 Allergy status to other drugs, medicaments and biological substances status: Secondary | ICD-10-CM

## 2021-05-18 DIAGNOSIS — Z7289 Other problems related to lifestyle: Secondary | ICD-10-CM

## 2021-05-18 HISTORY — DX: Repeated falls: R29.6

## 2021-05-18 HISTORY — DX: Obstruction of bile duct: K83.1

## 2021-05-18 HISTORY — DX: Alcoholic liver disease, unspecified: K70.9

## 2021-05-18 HISTORY — DX: Unspecified convulsions: R56.9

## 2021-05-18 HISTORY — DX: Noninfective gastroenteritis and colitis, unspecified: K52.9

## 2021-05-18 HISTORY — DX: Calculus of kidney: N20.0

## 2021-05-18 HISTORY — DX: Alcohol dependence, uncomplicated: F10.20

## 2021-05-18 HISTORY — DX: Multiple fractures of ribs, left side, initial encounter for closed fracture: S22.42XA

## 2021-05-18 HISTORY — DX: Other malaise: R53.81

## 2021-05-18 LAB — COMPREHENSIVE METABOLIC PANEL
ALT: 43 U/L (ref 0–44)
AST: 116 U/L — ABNORMAL HIGH (ref 15–41)
Albumin: 3.9 g/dL (ref 3.5–5.0)
Alkaline Phosphatase: 85 U/L (ref 38–126)
Anion gap: 18 — ABNORMAL HIGH (ref 5–15)
BUN: 22 mg/dL (ref 8–23)
CO2: 15 mmol/L — ABNORMAL LOW (ref 22–32)
Calcium: 8.7 mg/dL — ABNORMAL LOW (ref 8.9–10.3)
Chloride: 110 mmol/L (ref 98–111)
Creatinine, Ser: 1.55 mg/dL — ABNORMAL HIGH (ref 0.44–1.00)
GFR, Estimated: 34 mL/min — ABNORMAL LOW (ref >60)
Glucose, Bld: 162 mg/dL — ABNORMAL HIGH (ref 70–99)
Potassium: 4.1 mmol/L (ref 3.5–5.1)
Sodium: 143 mmol/L (ref 135–145)
Total Bilirubin: 1.6 mg/dL — ABNORMAL HIGH (ref 0.3–1.2)
Total Protein: 6.4 g/dL — ABNORMAL LOW (ref 6.5–8.1)

## 2021-05-18 LAB — RESP PANEL BY RT-PCR (FLU A&B, COVID) ARPGX2
Influenza A by PCR: NEGATIVE
Influenza B by PCR: NEGATIVE
SARS Coronavirus 2 by RT PCR: NEGATIVE

## 2021-05-18 LAB — URINALYSIS, ROUTINE W REFLEX MICROSCOPIC
Bilirubin Urine: NEGATIVE
Glucose, UA: NEGATIVE mg/dL
Ketones, ur: 5 mg/dL — AB
Nitrite: NEGATIVE
Protein, ur: 30 mg/dL — AB
RBC / HPF: >50 RBC/hpf — ABNORMAL HIGH (ref 0–5)
Specific Gravity, Urine: 1.029 (ref 1.005–1.030)
pH: 5 (ref 5.0–8.0)

## 2021-05-18 LAB — LIPASE, BLOOD: Lipase: 22 U/L (ref 11–51)

## 2021-05-18 LAB — TROPONIN I (HIGH SENSITIVITY)
Troponin I (High Sensitivity): 45 ng/L — ABNORMAL HIGH (ref <18)
Troponin I (High Sensitivity): 115 ng/L (ref <18)
Troponin I (High Sensitivity): 508 ng/L (ref <18)
Troponin I (High Sensitivity): 823 ng/L (ref <18)
Troponin I (High Sensitivity): 804 ng/L (ref <18)

## 2021-05-18 LAB — PROTIME-INR
INR: 1.1 (ref 0.8–1.2)
Prothrombin Time: 13.8 seconds (ref 11.4–15.2)

## 2021-05-18 LAB — CBC WITH DIFFERENTIAL/PLATELET
Abs Immature Granulocytes: 0.13 10*3/uL — ABNORMAL HIGH (ref 0.00–0.07)
Basophils Absolute: 0 10*3/uL (ref 0.0–0.1)
Basophils Relative: 0 %
Eosinophils Absolute: 0 10*3/uL (ref 0.0–0.5)
Eosinophils Relative: 0 %
HCT: 39.3 % (ref 36.0–46.0)
Hemoglobin: 12.5 g/dL (ref 12.0–15.0)
Immature Granulocytes: 1 %
Lymphocytes Relative: 3 %
Lymphs Abs: 0.4 10*3/uL — ABNORMAL LOW (ref 0.7–4.0)
MCH: 32.2 pg (ref 26.0–34.0)
MCHC: 31.8 g/dL (ref 30.0–36.0)
MCV: 101.3 fL — ABNORMAL HIGH (ref 80.0–100.0)
Monocytes Absolute: 1.5 10*3/uL — ABNORMAL HIGH (ref 0.1–1.0)
Monocytes Relative: 10 %
Neutro Abs: 13.4 10*3/uL — ABNORMAL HIGH (ref 1.7–7.7)
Neutrophils Relative %: 86 %
Platelets: 101 10*3/uL — ABNORMAL LOW (ref 150–400)
RBC: 3.88 MIL/uL (ref 3.87–5.11)
RDW: 15.1 % (ref 11.5–15.5)
WBC: 15.4 10*3/uL — ABNORMAL HIGH (ref 4.0–10.5)
nRBC: 0 % (ref 0.0–0.2)

## 2021-05-18 LAB — RAPID URINE DRUG SCREEN, HOSP PERFORMED
Amphetamines: NOT DETECTED
Barbiturates: NOT DETECTED
Benzodiazepines: NOT DETECTED
Cocaine: NOT DETECTED
Opiates: NOT DETECTED
Tetrahydrocannabinol: NOT DETECTED

## 2021-05-18 LAB — ETHANOL: Alcohol, Ethyl (B): <10 mg/dL (ref <10)

## 2021-05-18 LAB — CK: Total CK: 1071 U/L — ABNORMAL HIGH (ref 38–234)

## 2021-05-18 LAB — VITAMIN B12: Vitamin B-12: 193 pg/mL (ref 180–914)

## 2021-05-18 LAB — FOLATE: Folate: 10.5 ng/mL (ref >5.9)

## 2021-05-18 MED ORDER — HYDRALAZINE HCL 10 MG PO TABS: 10.0000 mg | ORAL_TABLET | Freq: Once | ORAL | Status: DC

## 2021-05-18 MED ORDER — ADULT MULTIVITAMIN W/MINERALS CH
1.0000 | ORAL_TABLET | Freq: Every day | ORAL | Status: DC
  Administered 2021-05-18 – 2021-05-24 (×7): 1 via ORAL
  Filled 2021-05-18 (×7): qty 1

## 2021-05-18 MED ORDER — SODIUM CHLORIDE 0.9 % IV BOLUS
500.0000 mL | Freq: Once | INTRAVENOUS | Status: AC
Start: 2021-05-18 — End: 2021-05-18
  Administered 2021-05-18: 500 mL via INTRAVENOUS

## 2021-05-18 MED ORDER — METOPROLOL SUCCINATE ER 100 MG PO TB24
100.0000 mg | ORAL_TABLET | Freq: Every morning | ORAL | Status: DC
  Administered 2021-05-19 – 2021-05-24 (×6): 100 mg via ORAL
  Filled 2021-05-18 (×6): qty 1

## 2021-05-18 MED ORDER — CYCLOSPORINE 0.05 % OP EMUL
1.0000 [drp] | Freq: Two times a day (BID) | OPHTHALMIC | Status: DC
  Administered 2021-05-18 – 2021-05-24 (×12): 1 [drp] via OPHTHALMIC
  Filled 2021-05-18 (×13): qty 1

## 2021-05-18 MED ORDER — ENOXAPARIN SODIUM 30 MG/0.3ML IJ SOSY
30.0000 mg | PREFILLED_SYRINGE | INTRAMUSCULAR | Status: DC
  Administered 2021-05-18: 30 mg via SUBCUTANEOUS
  Filled 2021-05-18: qty 0.3

## 2021-05-18 MED ORDER — METOPROLOL SUCCINATE ER 25 MG PO TB24
50.0000 mg | ORAL_TABLET | Freq: Every morning | ORAL | Status: DC
  Administered 2021-05-18: 50 mg via ORAL
  Filled 2021-05-18: qty 2

## 2021-05-18 MED ORDER — FOLIC ACID 1 MG PO TABS
1.0000 mg | ORAL_TABLET | Freq: Every day | ORAL | Status: DC
  Administered 2021-05-18 – 2021-05-24 (×7): 1 mg via ORAL
  Filled 2021-05-18 (×7): qty 1

## 2021-05-18 MED ORDER — ESCITALOPRAM OXALATE 10 MG PO TABS
10.0000 mg | ORAL_TABLET | Freq: Every morning | ORAL | Status: DC
  Administered 2021-05-19 – 2021-05-22 (×4): 10 mg via ORAL
  Filled 2021-05-18 (×4): qty 1

## 2021-05-18 MED ORDER — THIAMINE HCL 100 MG/ML IJ SOLN
500.0000 mg | Freq: Once | INTRAMUSCULAR | Status: AC
  Administered 2021-05-18: 500 mg via INTRAVENOUS
  Filled 2021-05-18: qty 6

## 2021-05-18 MED ORDER — TETANUS-DIPHTH-ACELL PERTUSSIS 5-2.5-18.5 LF-MCG/0.5 IM SUSY
0.5000 mL | PREFILLED_SYRINGE | Freq: Once | INTRAMUSCULAR | Status: AC
  Administered 2021-05-18: 0.5 mL via INTRAMUSCULAR

## 2021-05-18 MED ORDER — SODIUM CHLORIDE 0.9 % IV SOLN
1.0000 g | Freq: Once | INTRAVENOUS | Status: AC
  Administered 2021-05-18: 1 g via INTRAVENOUS
  Filled 2021-05-18: qty 10

## 2021-05-18 MED ORDER — LACTATED RINGERS IV SOLN: INTRAVENOUS | Status: DC

## 2021-05-18 MED ORDER — METOPROLOL SUCCINATE ER 25 MG PO TB24: 50.0000 mg | ORAL_TABLET | Freq: Every morning | ORAL | Status: DC

## 2021-05-18 MED ORDER — THIAMINE HCL 100 MG PO TABS
100.0000 mg | ORAL_TABLET | Freq: Every morning | ORAL | Status: DC
  Administered 2021-05-19 – 2021-05-24 (×6): 100 mg via ORAL
  Filled 2021-05-18 (×6): qty 1

## 2021-05-18 MED ORDER — METOPROLOL TARTRATE 50 MG PO TABS
50.0000 mg | ORAL_TABLET | Freq: Once | ORAL | Status: AC
  Administered 2021-05-18: 50 mg via ORAL
  Filled 2021-05-18: qty 1

## 2021-05-18 MED ORDER — CALCIUM CARBONATE-VITAMIN D 500-200 MG-UNIT PO TABS
1.0000 | ORAL_TABLET | Freq: Every day | ORAL | Status: DC
  Administered 2021-05-19 – 2021-05-24 (×6): 1 via ORAL
  Filled 2021-05-18 (×6): qty 1

## 2021-05-18 MED ORDER — IOHEXOL 300 MG/ML  SOLN
100.0000 mL | Freq: Once | INTRAMUSCULAR | Status: AC | PRN
  Administered 2021-05-18: 100 mL via INTRAVENOUS

## 2021-05-18 NOTE — Progress Notes (Signed)
FPTS Interim Progress Note  S: Seen pt at bedside this evening as pt was signed out as a Development worker, international aid. Pt was laying in the bed peacefully with her daughter at bedside. She denies any pain or concerns this evening. She would like to know when she can go home and whether she can do PT. I explained I cannot discharge her because she was just admitted to the hospital and further work up needs to be done as to why she fell. I explained she will be able to do PT when she is more physically stable and right now we are concerned about her heart. Provided update to daughter who was appreciative.  O: BP (!) 89/52 (BP Location: Left Arm)   Pulse 65   Temp (!) 96.7 F (35.9 C) (Axillary)   Resp 18   SpO2 96%    General: Alert, no acute distress, frail appearing Cardio: Normal S1 and S2, RRR, no r/m/g Pulm: CTAB, normal work of breathing Abdomen: Bowel sounds normal. Abdomen soft and non-tender. Reducible, umbilical hernia present  Neuro: Cranial nerves grossly intact, ANO x 4 Skin: Multiple bruises all over body   A/P: Plan per day team. -Monitor CIWAs -Trend troponins -Cardiology recs -RN to page night team if any concerns.   Lattie Haw, MD 05/18/2021, 8:44 PM PGY-2, [facility] Service pager [phone]

## 2021-05-18 NOTE — ED Provider Notes (Signed)
Emergency Department Provider Note   I have reviewed the triage vital signs and the nursing notes.   HISTORY  Chief Complaint No chief complaint on file.   HPI [name] is a 80 y.o. female with limited past medical history known at the time of arrival due to "doe" status arrives as a level 2 trauma after being found down at home.  Patient was last seen yesterday at 1 PM.  Patient was found down by her daughter.  She was lying prone on the floor.  EMS arrived to find bruising and abrasions in multiple locations and activated a level 2 trauma.  Patient also found to be tachycardic.  Patient tells me that she fell yesterday and she thinks it was in the afternoon.  She believes it was still light outside.  She feels like she tripped over something but has difficulty recalling the specific events leading to the fall.  She is not having unilateral weakness or numbness.  Denies significant pain at rest but does have pain in multiple locations with movement.   Level 5 caveat: AMS   No past medical history on file.  There are no problems to display for this patient.   Allergies Clindamycin/lincomycin and Doxycycline  No family history on file.  Social History    Review of Systems  Constitutional: No fever/chills Eyes: No visual changes. ENT: No sore throat. Cardiovascular: Denies chest pain. Respiratory: Denies shortness of breath. Gastrointestinal: No abdominal pain.  No nausea, no vomiting.  No diarrhea.  No constipation. Genitourinary: Negative for dysuria. Musculoskeletal: Diffuse pain in bilateral arms/legs.  Skin: Multiple areas of abrasion.skin tears.  Neurological: Negative for headaches, focal weakness or numbness. ____________________________________________   PHYSICAL EXAM:  VITAL SIGNS: ED Triage Vitals  Enc Vitals Group     BP 05/18/21 0957 (!) 188/129     Pulse Rate 05/18/21 0958 (!) 115     Resp 05/18/21 0957 (!) 29     Temp 05/18/21 1002 98.1 F  (36.7 C)     Temp Source 05/18/21 1002 Oral     SpO2 05/18/21 0958 (!) 86 %     Weight 05/18/21 1003 99 lb 3.3 oz (45 kg)     Height 05/18/21 1003 5' (1.524 m)   Constitutional: Alert and able to provide a history with mild confusion. Well appearing and in no acute distress. Eyes: Conjunctivae are normal. Bilateral periorbital ecchymosis. EOMI. Grossly intact vision.  Head: Bruising to the left parietal scalp.  Nose: No congestion/rhinnorhea. Mouth/Throat: Mucous membranes are dry.  Neck: No stridor. C- collar in place.  Cardiovascular: Tachycardia. Good peripheral circulation. Grossly normal heart sounds.   Respiratory: Normal respiratory effort.  No retractions. Lungs CTAB. Gastrointestinal: Soft with mild diffuse tenderness. No distention.  Musculoskeletal: Bruising over the left shoulder but preserved range of motion.  Skin tear to the left lateral arm.  Normal range of motion of the right elbow and wrist.  Preserved range of motion of the bilateral hips.  Swelling and ecchymosis around the bilateral knees with some pressure type wounds in that location.  No bony tenderness.  Neurologic:  Normal speech and language. No gross focal neurologic deficits are appreciated.  Skin:  Skin is warm and dry.  Multiple areas of bruising.  Skin tear to the left upper arm.  Small abrasion to the left forearm.  Ecchymosis to the left shoulder and left upper chest wall.  Periorbital ecchymosis.    ____________________________________________   LABS (all labs ordered are listed,  but only abnormal results are displayed)  Labs Reviewed  COMPREHENSIVE METABOLIC PANEL - Abnormal; Notable for the following components:      Result Value   CO2 15 (*)    Glucose, Bld 162 (*)    Creatinine, Ser 1.55 (*)    Calcium 8.7 (*)    Total Protein 6.4 (*)    AST 116 (*)    Total Bilirubin 1.6 (*)    GFR, Estimated 34 (*)    Anion gap 18 (*)    All other components within normal limits  CBC WITH  DIFFERENTIAL/PLATELET - Abnormal; Notable for the following components:   WBC 15.4 (*)    MCV 101.3 (*)    Platelets 101 (*)    Neutro Abs 13.4 (*)    Lymphs Abs 0.4 (*)    Monocytes Absolute 1.5 (*)    Abs Immature Granulocytes 0.13 (*)    All other components within normal limits  CK - Abnormal; Notable for the following components:   Total CK 1,071 (*)    All other components within normal limits  URINALYSIS, ROUTINE W REFLEX MICROSCOPIC - Abnormal; Notable for the following components:   Color, Urine AMBER (*)    APPearance TURBID (*)    Hgb urine dipstick LARGE (*)    Ketones, ur 5 (*)    Protein, ur 30 (*)    Leukocytes,Ua MODERATE (*)    RBC / HPF >50 (*)    Bacteria, UA MANY (*)    All other components within normal limits  TROPONIN I (HIGH SENSITIVITY) - Abnormal; Notable for the following components:   Troponin I (High Sensitivity) 45 (*)    All other components within normal limits  RESP PANEL BY RT-PCR (FLU A&B, COVID) ARPGX2  ETHANOL  LIPASE, BLOOD  PROTIME-INR  VITAMIN B1  TROPONIN I (HIGH SENSITIVITY)   ____________________________________________  EKG   EKG Interpretation  Date/Time:  Saturday May 18 2021 10:05:02 EDT Ventricular Rate:  116 PR Interval:  175 QRS Duration: 78 QT Interval:  312 QTC Calculation: 434 R Axis:   48 Text Interpretation: Sinus tachycardia Probable anteroseptal infarct, old Confirmed by Nanda Quinton [phone] on 05/18/2021 10:16:30 AM         ____________________________________________  RADIOLOGY  CT Head Wo Contrast  Result Date: 05/18/2021 CLINICAL DATA:  Patient status post fall. EXAM: CT HEAD WITHOUT CONTRAST CT CERVICAL SPINE WITHOUT CONTRAST TECHNIQUE: Multidetector CT imaging of the head and cervical spine was performed following the standard protocol without intravenous contrast. Multiplanar CT image reconstructions of the cervical spine were also generated. COMPARISON:  Brain CT 09/03/2020. FINDINGS: CT HEAD  FINDINGS Brain: Ventricles and sulci are appropriate for patient's age. No evidence for acute cortically based infarct, intracranial hemorrhage, mass lesion or mass-effect. Vascular: Unremarkable. Skull: No aggressive or acute appearing osseous lesions. Sinuses/Orbits: Paranasal sinuses are well aerated. Mastoid air cells are unremarkable. Orbits are unremarkable. Other: None. CT CERVICAL SPINE FINDINGS Alignment: Normal anatomic alignment. Skull base and vertebrae: Intact. Soft tissues and spinal canal: No prevertebral fluid or swelling. No visible canal hematoma. Disc levels: Multilevel degenerative disc disease most pronounced C5-6. No acute fracture. Upper chest: Postsurgical changes right lung apex. Other: None. IMPRESSION: No acute cervical spine fracture. No acute intracranial process. Electronically Signed   By: Lovey Newcomer M.D.   On: 05/18/2021 11:59   CT Cervical Spine Wo Contrast  Result Date: 05/18/2021 CLINICAL DATA:  Patient status post fall. EXAM: CT HEAD WITHOUT CONTRAST CT CERVICAL SPINE WITHOUT CONTRAST TECHNIQUE:  Multidetector CT imaging of the head and cervical spine was performed following the standard protocol without intravenous contrast. Multiplanar CT image reconstructions of the cervical spine were also generated. COMPARISON:  Brain CT 09/03/2020. FINDINGS: CT HEAD FINDINGS Brain: Ventricles and sulci are appropriate for patient's age. No evidence for acute cortically based infarct, intracranial hemorrhage, mass lesion or mass-effect. Vascular: Unremarkable. Skull: No aggressive or acute appearing osseous lesions. Sinuses/Orbits: Paranasal sinuses are well aerated. Mastoid air cells are unremarkable. Orbits are unremarkable. Other: None. CT CERVICAL SPINE FINDINGS Alignment: Normal anatomic alignment. Skull base and vertebrae: Intact. Soft tissues and spinal canal: No prevertebral fluid or swelling. No visible canal hematoma. Disc levels: Multilevel degenerative disc disease most  pronounced C5-6. No acute fracture. Upper chest: Postsurgical changes right lung apex. Other: None. IMPRESSION: No acute cervical spine fracture. No acute intracranial process. Electronically Signed   By: Lovey Newcomer M.D.   On: 05/18/2021 11:59   DG Pelvis Portable  Result Date: 05/18/2021 CLINICAL DATA:  Found on floor EXAM: PORTABLE PELVIS 1-2 VIEWS COMPARISON:  12/18/2019 FINDINGS: There is no evidence of pelvic fracture or diastasis. No pelvic bone lesions are seen. Lumbar scoliosis and degenerative change. Surgical staples in the right lower quadrant. IMPRESSION: Negative. Electronically Signed   By: Franchot Gallo M.D.   On: 05/18/2021 10:44   CT CHEST ABDOMEN PELVIS W CONTRAST  Result Date: 05/18/2021 CLINICAL DATA:  Abdominal trauma. EXAM: CT CHEST, ABDOMEN, AND PELVIS WITH CONTRAST TECHNIQUE: Multidetector CT imaging of the chest, abdomen and pelvis was performed following the standard protocol during bolus administration of intravenous contrast. CONTRAST:  138mL OMNIPAQUE IOHEXOL 300 MG/ML  SOLN COMPARISON:  February 29, 2020 FINDINGS: CT CHEST FINDINGS Cardiovascular: Mildly enlarged heart. Calcific atherosclerotic disease of the coronary arteries and aorta. No central pulmonary embolus. Mediastinum/Nodes: No enlarged mediastinal, hilar, or axillary lymph nodes. Thyroid gland, trachea, and esophagus demonstrate no significant findings. Lungs/Pleura: Lungs are clear. No pleural effusion or pneumothorax. Musculoskeletal: No chest wall mass or suspicious bone lesions identified. CT ABDOMEN PELVIS FINDINGS Hepatobiliary: No hepatic injury or perihepatic hematoma. Gallbladder is unremarkable. Hepatic steatosis. Pancreas: Unremarkable. No pancreatic ductal dilatation or surrounding inflammatory changes. Spleen: No splenic injury or perisplenic hematoma. Adrenals/Urinary Tract: Normal adrenal glands. Bilateral nonobstructive renal calculi measuring up to 7 mm. 2.7 cm left renal cyst. Normal urinary bladder.  Stomach/Bowel: Stomach is within normal limits. No evidence of bowel wall thickening, distention, or inflammatory changes. Vascular/Lymphatic: Aortic atherosclerosis and tortuosity. No enlarged abdominal or pelvic lymph nodes. Reproductive: Status post hysterectomy. No adnexal masses. Other: Fat containing periumbilical anterior abdominal wall hernia. Second fat and bowel containing infraumbilical anterior abdominal wall hernia. No CT evidence of bowel incarceration. Postsurgical changes in the abdomen. Musculoskeletal: No fracture is seen. Scoliosis and spondylosis of the spine. IMPRESSION: 1. No evidence of acute traumatic injury to the chest, abdomen or pelvis. 2. Hepatic steatosis. 3. Bilateral nonobstructive renal calculi. 4. Fat containing periumbilical anterior abdominal wall hernia. 5. Second fat and bowel containing infraumbilical anterior abdominal wall hernia. No CT evidence of bowel incarceration. 6. Aortic atherosclerosis. Aortic Atherosclerosis (ICD10-I70.0). Electronically Signed   By: Fidela Salisbury M.D.   On: 05/18/2021 12:25   DG Chest Portable 1 View  Result Date: 05/18/2021 CLINICAL DATA:  Found on floor.  Fall EXAM: PORTABLE CHEST 1 VIEW COMPARISON:  03/02/2020 FINDINGS: Heart size and mediastinal structures normal. Vascularity normal. Lungs clear without infiltrate effusion. Surgical staples right lung apex. Left rib fractures are present which are likely acute and not  seen previously. IMPRESSION: Left rib fractures. No acute cardiopulmonary abnormality. Electronically Signed   By: Franchot Gallo M.D.   On: 05/18/2021 10:35    ____________________________________________   PROCEDURES  Procedure(s) performed:   Procedures  CRITICAL CARE Performed by: Margette Fast Total critical care time: 35 minutes Critical care time was exclusive of separately billable procedures and treating other patients. Critical care was necessary to treat or prevent imminent or life-threatening  deterioration. Critical care was time spent personally by me on the following activities: development of treatment plan with patient and/or surrogate as well as nursing, discussions with consultants, evaluation of patient's response to treatment, examination of patient, obtaining history from patient or surrogate, ordering and performing treatments and interventions, ordering and review of laboratory studies, ordering and review of radiographic studies, pulse oximetry and re-evaluation of patient's condition.  Nanda Quinton, MD Emergency Medicine  ____________________________________________   INITIAL IMPRESSION / ASSESSMENT AND PLAN / ED COURSE  Pertinent labs & imaging results that were available during my care of the patient were reviewed by me and considered in my medical decision making (see chart for details).   Patient presents to the emergency department with fall likely sometime yesterday afternoon.  She has been down on the ground for a prolonged period of time based on the bruising and pressure type changes on multiple locations including her face, shoulder, bilateral knees.  She arrives tachycardic.  Appears clinically dehydrated.  Plan for small fluid bolus.  Would not be surprised if patient has acute kidney injury and elevated CK.  These labs are ordered and sent.  Will need CT imaging of her head, neck, chest, abdomen/pelvis.  Have ordered initially with IV contrast but pending renal function.   CK elevated to 1000. Creatine not severely elevated. Continue IVF. CT scans without acute traumatic injury. PLan for basic wound care for skin tares and pressure wounds. Patient is awake and alert. Daughter at bedside provides more history.  The patient does live alone but she checks on her daily.  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 Patient tells me that she has had progressive worsening balance issues. Will add  Thiamine level and treat with 500 mg thiamine. Plan for admit.   Discussed patient's case with Family Medicine to request admission. Patient and family (if present) updated with plan. Care transferred to [facility] Medicine service.  I reviewed all nursing notes, vitals, pertinent old records, EKGs, labs, imaging (as available).  ____________________________________________  FINAL CLINICAL IMPRESSION(S) / ED DIAGNOSES  Final diagnoses:  Trauma  Fall, initial encounter  Elevated CK  Dehydration    MEDICATIONS GIVEN DURING THIS VISIT:  Medications  lactated ringers infusion ( Intravenous New Bag/Given 05/18/21 1329)  thiamine (B-1) injection 500 mg (has no administration in time range)  cefTRIAXone (ROCEPHIN) 1 g in sodium chloride 0.9 % 100 mL IVPB (has no administration in time range)  sodium chloride 0.9 % bolus 500 mL (0 mLs Intravenous Stopped 05/18/21 1147)  Tdap (BOOSTRIX) injection 0.5 mL (0.5 mLs Intramuscular Given 05/18/21 1006)  iohexol (OMNIPAQUE) 300 MG/ML solution 100 mL (100 mLs Intravenous Contrast Given 05/18/21 1112)    Note:  This document was prepared using Dragon voice recognition software and may include unintentional dictation errors.  Nanda Quinton, MD, [facility] Emergency Medicine    Fredna Stricker, [facility], MD 05/18/21 [phone]

## 2021-05-18 NOTE — H&P (Addendum)
[facility] Admission History and Physical Service Pager: [phone]  Patient name: [name] Medical record number: [id number] Date of birth: [birth year] Age: 80 y.o. Gender: female  Primary Care Provider: Asencion Noble, MD Consultants: Wound Code Status: Full Preferred Emergency Contact: Daughter [name]  Chief Complaint: Fall  Assessment and Plan: [name] is a 80 y.o. female presenting with Fall. 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   Found Down  Dehydration  Last spoke with daughter on phone at 3 PM.  Daughter found early this morning on floor.  Patient does not remember what caused fall.  Currently Hypertensive and Tachycardic, though tachycardia improving some with fluids.  Action tremor in both legs and arms on exam.  Ataxia with finger to nose.  No Asterixis.  Has bruising on around eyes bilaterally and left forehead, left shoulder, both arms and legs.  Patient's duaghter indicates some bruises on arms and legs are old.  Patient alert and oriented times 3 but has some issues with short-term memory and some confusion.  Long history of alcohol use disorder and falls.  Gait has been declining over pat year.  AST> 2x ALT.  Ethanol negative.  UDS pending.  UA indicitive of possible UTI.  Chest-X-Ray shows mild left rib fracture but no concerning lung findings.  Head CT, CT A&P, CT Cervical Spine show no acute findings. Given frequent alcohol use, intoxication could be cause for fall & memory lapse.  Could also have Wernicke-Korsikoff, although less likely given chronic nature of problem.  Given dose of IV Thiamine and on 100 mg daily tablet at home. Given bruising all over body, some concern for NAT.  Though daughter thinks this is unlikely and patient has  history of easy bruising and frequent falls.  Seen by Neurology for syncopal episodes previously.  MRI in January shows significant cerebellar atrophy thought likely due to long-term alcohol use disorder.  Will obtain UDS.  Given Macrocytic anemia could also be due to B12 deficiency.  Plan to admit for further evaluation and treatment as indicated.    - Admit to inpatient service, Attending Dr McDiarmid - CIWA monitoring with Ativan - PT/OT eval and treat - f/u A1C., TSH - f/u B1, Folate, & B12 - f/u Urine culture - f/u UDS - AM CBC, CMP and Platelets - Fall precautions  Alcohol Use Disorder  Macrocytic Anemia  Thrombocytopenia Last CIWA score was 4.  No sign of asterixis on exam.  Bilateral hand and leg action tremors. Per daughter drinks around 7 glasses of wine a day. Receives B12 injection every month.  Last received last month. Platelets currently at 101.  Though concern for elevation for all cell lines.  Also has history of Subarachnoid hemorrhage.  PT and INR normal.   - AM Platelets - On Lovenox, monitor for increased bleeding - Obtain B12 and folate levels - Start Folate and Multivitamins  - On CIWA monitoring - follow up B1 level - Continue Daily Thiamine supplement  Superficial Lacerations, leg wounds On knees and left shin. (Appreciate pictures below). Do not believe will need stitches. - Consult wound care  HTN SBP 130-200, DBP 80-129.  Will restart home Metoprolol Succinate 50 mg today.  Likely in setting of alcohol withdrawal. - Continue home Metoprolol 50 mg - Will give hydral 10 mg x1 and continue to monitor  AKI vs  CKD Creatinine 1.5.  Baseline 1 year ago was 0.7-0.9.  Initial CK elevated at 1071.  Given fluid bolus and started on IV LR 125 mL/hour - Continue IV LR 125 mL/hr    Concern for UTI UA positive for LE, WBC's, and Bacteria.  Started on CTX in ED.  - follow up Ucx - Continue CTX; de-escalate when appropriate  Elevated Troponin 45>121.  Increase  likely due to skeletal muscle leakage.   - Continue to trend Trops  Depression On Lexapro 10 mg. - Continue home Lexapro    Osteoporosis Receives Ravatio injection every 6 months.  Last dose in March.  Also Daily calcium supplement.  Ca- 8.7. - Continue Calcium supplement  Hyperglycemia Glu 162.  -f/u Hgb A1c -trend on AM CMP  FEN/GI: Regular Diet Prophylaxis: Lovenox  Disposition: Med/Surg  History of Present Illness:  [name] is a 80 y.o. female presenting with confusion after fall.  Presents with fall sometime yesterday. Daughter states a family friend [name] came by yesterday around 11am to help run errands. Daughter spoke to them both around 1pm. Daughter went to patients home when she did not answer this morning. She found her face down on the floor in her home. Patient remembers waking up, but not much from day before. She is feeling imbalanced and losing her memory. Her lips are most painful because they are chapped. During interview saw a moth flying.  Also indicates she bruises very easily.  Mother and daughter deny any non-accidental trauma.  Admits to alcohol use. States she has a cup of wine. Daughter states she drinks box wine and usually 2 personal boxes daily. Last drink stated to be Tuesday or Wednesday. Daughter believes it was likely yesterday. Previous smoker 20 yrs prior.  Review Of Systems: Per HPI with the following additions:   Review of Systems  Respiratory:  Negative for cough.   Cardiovascular:  Negative for chest pain.  Gastrointestinal:  Negative for nausea and vomiting.  Skin:  Positive for wound.  Hematological:  Bruises/bleeds easily.  Psychiatric/Behavioral:  Positive for confusion.     There are no problems to display for this patient.   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)     Carpal tunnel syndrome, bilateral     Depression     GERD  (gastroesophageal reflux disease)     Kidney stones     Liver disease     Osteoporosis     Subarachnoid bleed [facility])     Past Surgical History:   ABDOMINAL HYSTERECTOMY        vaginal   APPENDECTOMY       CARPAL TUNNEL RELEASE   01/21/2012    Procedure: CARPAL TUNNEL RELEASE;  Surgeon: Wynonia Sours, MD;  Location: [facility];  Service: Orthopedics;  Laterality: Right;   COLON RESECTION   2004    perf bowel after colonoscopy   CYSTOSCOPY W/ URETERAL STENT PLACEMENT   2010    lt-lazer stone   CYSTOSCOPY WITH STENT PLACEMENT Bilateral 09/08/2014    Procedure: CYSTOSCOPY WITH STENT PLACEMENT;  Surgeon: Malka So, MD;  Location: [facility];  Service: Urology;  Laterality: Bilateral;   THORACOTOMY   2007    vatz-rt upper lobe   TONSILLECTOMY        Social History:   Additional social history: Please also refer to  relevant sections of EMR.  Family History: Father- Lung Cancer  Allergies and Medications: Allergies  Allergen Reactions   Clindamycin/Lincomycin Rash   Doxycycline Rash   No current facility-administered medications on file prior to encounter.   Current Outpatient Medications on File Prior to Encounter  Medication Sig Dispense Refill   Calcium Carb-Cholecalciferol (CALCIUM + D3 PO) Take 1 tablet by mouth daily with breakfast.     cyanocobalamin (,VITAMIN B-12,) 1000 MCG/ML injection Inject 1,000 mcg into the skin every 30 (thirty) days.     denosumab (PROLIA) 60 MG/ML SOSY injection Inject 60 mg into the skin every 6 (six) months.     escitalopram (LEXAPRO) 10 MG tablet Take 1 tablet by mouth in the morning.     levETIRAcetam (KEPPRA) 500 MG tablet Take 500 mg by mouth in the morning.     metoprolol succinate (TOPROL-XL) 50 MG 24 hr tablet Take 50 mg by mouth in the morning.     potassium chloride (KLOR-CON) 10 MEQ tablet Take 10 mEq by mouth in the morning.     RESTASIS 0.05 % ophthalmic emulsion Place 1 drop into both eyes 2 (two) times daily.      thiamine 100 MG tablet Take 100 mg by mouth in the morning.      Objective: BP (!) 155/123   Pulse (!) 107   Temp 98.1 F (36.7 C) (Oral)   Resp (!) 26   Ht 5' (1.524 m)   Wt 45 kg   SpO2 99%   BMI 19.38 kg/m  Exam:  Physical Exam Constitutional:      General: She is not in acute distress.    Appearance: She is not diaphoretic.  HENT:     Head: Normocephalic.     Comments: Trauma and bruising    Mouth/Throat:     Mouth: Mucous membranes are moist.  Cardiovascular:     Rate and Rhythm: Regular rhythm. Tachycardia present.     Pulses: Normal pulses.  Pulmonary:     Effort: Pulmonary effort is normal.     Breath sounds: Normal breath sounds.  Abdominal:     General: Abdomen is flat.     Palpations: Abdomen is soft.     Tenderness: There is no abdominal tenderness.  Musculoskeletal:        General: Signs of injury present.     Right lower leg: No edema.     Left lower leg: No edema.  Skin:    Findings: Bruising present.     Comments: Patient has bruising on arms and legs bilaterally as well as right shoulder and face  Neurological:     General: No focal deficit present.     Mental Status: She is alert and oriented to person, place, and time. She is confused.     Motor: Tremor present.     Coordination: Coordination abnormal.     Comments: Action tremor in both legs and arms     Labs and Imaging: CBC BMET  Recent Labs  Lab 05/18/21 1000  WBC 15.4*  HGB 12.5  HCT 39.3  PLT 101*   Recent Labs  Lab 05/18/21 1000  NA 143  K 4.1  CL 110  CO2 15*  BUN 22  CREATININE 1.55*  GLUCOSE 162*  CALCIUM 8.7*     EKG: Sinus Tachycardia  PORTABLE CHEST 1 VIEW COMPARISON:  03/02/2020 IMPRESSION: Left rib fractures No acute cardiopulmonary abnormality  PORTABLE PELVIS 1-2 VIEWS COMPARISON:  12/18/2019 IMPRESSION: Negative.  CT HEAD WITHOUT CONTRAST  CT CERVICAL SPINE WITHOUT CONTRAST COMPARISON:  Brain CT 09/03/2020.  IMPRESSION: No acute cervical spine  fracture. No acute intracranial process.   CT CHEST, ABDOMEN, AND PELVIS WITH CONTRAST COMPARISON:  February 29, 2020 IMPRESSION: 1. No evidence of acute traumatic injury to the chest, abdomen or pelvis. 2. Hepatic steatosis. 3. Bilateral nonobstructive renal calculi. 4. Fat containing periumbilical anterior abdominal wall hernia. 5. Second fat and bowel containing infraumbilical anterior abdominal wall hernia. No CT evidence of bowel incarceration. 6. Aortic atherosclerosis. Aortic Atherosclerosis (ICD10-I70.0).                Delora Fuel, MD 05/18/2021, 1:55 PM PGY-1, [place] Intern pager: [phone], text pages welcome   FPTS Upper-Level Resident Addendum   I have independently interviewed and examined the patient. I have discussed the above with the original author and agree with their documentation. My edits for correction/addition/clarification are within the document. Please see also any attending notes.   Gerlene Fee, DO PGY-2, [facility] 05/18/2021 6:15 PM  [place] Service pager: [phone] (text pages welcome through [facility])

## 2021-05-18 NOTE — Progress Notes (Signed)
Patient received from ED at 1708 accompanied by NT and her daughter. She is noted to have multiple skin tears sustained from a fall at home and multiple bruises both old and new per daughter. Her skin appears so thin and fragile. All open areas cleaned with NS wipes and surrounding skin with betadine swabs , then covered with xeroform and kerlix.

## 2021-05-18 NOTE — Progress Notes (Deleted)
Received from ED accpd. by NT and daughter. Patient has mulyiple skin tears on both knees and arms sustained from fall at home.  Skin appear to be very fragile and she also have multiple bruises to her face, extremeties, and her body.  All open areas are cleaned with betadine and covered with xeroform .

## 2021-05-18 NOTE — ED Triage Notes (Signed)
Pt found by daughter prone on the floor this morning. Last seen normal 1300 yesterday. Pt cannot recall how/when she fell. Pt c/o knee pain secondary to arthritis. Multiple abrasions and bruising throughout body. Skin tears to L arm. A/O x 3. Denies alcohol intake, although hx of alcoholism. C collar in place.

## 2021-05-18 NOTE — Progress Notes (Signed)
Orthopedic Tech Progress Note Patient Details:  GENEVIVE PRINTUP February 19, 1941 [id number] Level 2 trauma  Patient ID: Wallis Mart, female   DOB: 1941/08/17, 80 y.o.   MRN: [id number]  Germaine Pomfret 05/18/2021, 12:24 PM

## 2021-05-18 NOTE — Consult Note (Addendum)
Cardiology Consultation:   Patient ID: [name] MRN: [id number]; DOB: [birth year]  Admit date: 05/18/2021 Date of Consult: 05/19/2021  Primary Care Provider: Asencion Noble, MD [facility] Cardiologist: None  [facility] Electrophysiologist:  None  Patient Profile:   [name] is a 80 y.o. 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 Cardiology is consulted for elevated troponin.   History of Present Illness:   [name] was found down by her daughter but was still responsive at the time. On arrival to the ED she was HTN (188/129), tachycardic (HR 115) and tachypnic (RR 29) with associated hypoxia (SpO2 86%). She was mildly confused and had multiple skin tears and ecchymosis. Lab work was notable for a mild AG (18), leukocytosis (15.4), elevated CK (1071), UA c/w UTI and mild hsT elevation (45). ECG with Q waves V1-V3 but otherwise no dynamic ischemic changes.   Spoke with daughter about patient.  Daughter arrived to mother's house and patient was face down on floor. No active bleeding but blood on floor. She called 911 and when they rolled her over her eyes were swollen/red, knees were swollen/red. Lives in [place] alone. Has caretaker that comes by daily to run errands and check on her, help her take meds. Her mother won't let her in the house although she will go out on errands with her. Similar episodes have happened in the past. Down plays significant etoh use. Similar situation 1.5-2 years ago where she fell and same thing happened. She had not been good to caretakers before so she lost her caretakers during that time. Eventually moved her to [facility] to closer to daughter to stepdown/independent living and did well for a few months. Then started drinking again but asked to leave in August because of her behavioral issues. Then moved back home. Now care giver comes by daily for wellness check and to take her on errands.  Multiple wounds on body. Has been to rehab twice. Started around 15 years ago heavily drinking. Only drinking wine, usually drinks 2 boxes of wine daily. Distant past smoking hx (>25 years ago). No family hx of premature CAD/SCD per daughter. Not reporting any recent chest pain/pressure or other anginal equivalents.   Last you spoke to her was 3 PM and then 9 AM on 06/18 daughter arrived to house.  [name] was conversant but fidgeting with her telemetry wires throughout her conversation.  She was A&O x3 but denied any recent EtOH use even when her daughter said that she continues to drink wine on a daily basis.  She has multiple abrasions across all extremities and has bruising and ecchymosis across her face and eyes.  She said she does not remember what happened leading up to the time where she fell but blames it on recurrent dizziness.  She said she used to drink alcohol heavily but has not had any a very long time.  She denies any anginal equivalents such as chest pain/pressure, shortness of breath, dyspnea on exertion, orthopnea or PND.  History reviewed. No pertinent past medical history.  History reviewed. No pertinent surgical history.     Inpatient Medications: Scheduled Meds:  calcium-vitamin D  1 tablet Oral Q breakfast   cycloSPORINE  1 drop Both Eyes BID   escitalopram  10 mg Oral q AM   folic acid  1 mg Oral Daily   metoprolol succinate  100 mg Oral q AM   multivitamin with  minerals  1 tablet Oral Daily   thiamine  100 mg Oral q AM   Continuous Infusions:  cefTRIAXone (ROCEPHIN)  IV     lactated ringers 125 mL/hr at 05/19/21 0700   PRN Meds:  Allergies:    Allergies  Allergen Reactions   Clindamycin/Lincomycin Rash   Doxycycline Rash   Social History:   Social History   Socioeconomic History   Marital status: Widowed    Spouse name: Not on file   Number of children: Not on file   Years of education: Not on file   Highest education level: Not on file   Occupational History   Not on file  Tobacco Use   Smoking status: Not on file   Smokeless tobacco: Not on file  Substance and Sexual Activity   Alcohol use: Not on file   Drug use: Not on file   Sexual activity: Not on file  Other Topics Concern   Not on file  Social History Narrative   Not on file   Social Determinants of Health   Financial Resource Strain: Not on file  Food Insecurity: Not on file  Transportation Needs: Not on file  Physical Activity: Not on file  Stress: Not on file  Social Connections: Not on file  Intimate Partner Violence: Not on file    Family History:   History reviewed. No pertinent family history.   ROS:  Review of Systems: [y] = yes, [ ]  = no      General: Weight gain [ ] ; Weight loss [ ] ; Anorexia [ ] ; Fatigue [ ] ; Fever [ ] ; Chills [ ] ; Weakness [ ]    Cardiac: Chest pain/pressure [ ] ; Resting SOB [ ] ; Exertional SOB [ ] ; Orthopnea [ ] ; Pedal Edema [ ] ; Palpitations [ ] ; Syncope [ ] ; Presyncope [ ] ; Paroxysmal nocturnal dyspnea [ ]    Pulmonary: Cough [ ] ; Wheezing [ ] ; Hemoptysis [ ] ; Sputum [ ] ; Snoring [ ]    GI: Vomiting [ ] ; Dysphagia [ ] ; Melena [ ] ; Hematochezia [ ] ; Heartburn [ ] ; Abdominal pain [ ] ; Constipation [ ] ; Diarrhea [ ] ; BRBPR [ ]    GU: Hematuria [ ] ; Dysuria [ ] ; Nocturia [ ]  Vascular: Pain in legs with walking [ ] ; Pain in feet with lying flat [ ] ; Non-healing sores [ ] ; Stroke [ ] ; TIA [ ] ; Slurred speech [ ] ;   Neuro: Headaches [ ] ; Vertigo [ ] ; Seizures [ ] ; Paresthesias [ ] ;Blurred vision [ ] ; Diplopia [ ] ; Vision changes [ ]  Dizziness [y]   Ortho/Skin: Arthritis [ ] ; Joint pain [ ] ; Muscle pain [ ] ; Joint swelling [ ] ; Back Pain [ ] ; Rash [ ]    Psych: Depression [ ] ; Anxiety [ ]    Heme: Bleeding problems [ ] ; Clotting disorders [ ] ; Anemia [ ]    Endocrine: Diabetes [ ] ; Thyroid dysfunction [ ]    Physical Exam/Data:   Vitals:   05/18/21 2016 05/18/21 2210 05/19/21 0202 05/19/21 0539  BP: 105/75 107/76 128/85 (!)  120/94  Pulse: 87 82 79 88  Resp: 17 18 18 17   Temp: 97.6 F (36.4 C) 97.7 F (36.5 C) 98.2 F (36.8 C) 98.3 F (36.8 C)  TempSrc: Oral Oral Oral Oral  SpO2:  95%  96%  Weight:      Height:        Intake/Output Summary (Last 24 hours) at 05/19/2021 0747 Last data filed at 05/19/2021 0700 Gross per 24 hour  Intake 2986.77 ml  Output 0  ml  Net 2986.77 ml   Last 3 Weights 05/18/2021  Weight (lbs) 99 lb 3.3 oz  Weight (kg) 45 kg     Body mass index is 19.38 kg/m.  General: ill appearing, tremulous HEENT: normal Lymph: no adenopathy Neck: no JVD Endocrine:  No thryomegaly Vascular: No carotid bruits; FA pulses 2+ bilaterally without bruits  Cardiac:  normal S1, S2; RRR; no murmur  Lungs:  clear to auscultation bilaterally, no wheezing, rhonchi or rales  Abd: soft, nontender, no hepatomegaly  Ext: no edema Musculoskeletal:  No deformities, BUE and BLE strength normal and equal Skin: diffuse ecchymosis across face and all extremities, lacerations across legs b/l Neuro:  CNs 2-12 intact, no focal abnormalities noted Psych:  Normal affect   EKG:  The EKG was personally reviewed and demonstrates: NSR no acute ischemic changes  Relevant CV Studies: None   Laboratory Data:  High Sensitivity Troponin:   Recent Labs  Lab 05/18/21 1000 05/18/21 1254 05/18/21 1708 05/18/21 2123 05/18/21 2301  TROPONINIHS 45* 115* 508* 823* 804*     Chemistry Recent Labs  Lab 05/18/21 1000 05/19/21 0221  NA 143 138  K 4.1 4.0  CL 110 102  CO2 15* 17*  GLUCOSE 162* 83  BUN 22 22  CREATININE 1.55* 1.36*  CALCIUM 8.7* 8.1*  GFRNONAA 34* 40*  ANIONGAP 18* 19*    Recent Labs  Lab 05/18/21 1000 05/19/21 0221  PROT 6.4* 5.5*  ALBUMIN 3.9 3.3*  AST 116* 118*  ALT 43 46*  ALKPHOS 85 61  BILITOT 1.6* 1.3*   Hematology Recent Labs  Lab 05/18/21 1000 05/19/21 0221  WBC 15.4* 12.3*  RBC 3.88 3.24*  HGB 12.5 10.5*  HCT 39.3 32.7*  MCV 101.3* 100.9*  MCH 32.2 32.4  MCHC  31.8 32.1  RDW 15.1 15.2  PLT 101* 68*   BNPNo results for input(s): BNP, PROBNP in the last 168 hours.  DDimer No results for input(s): DDIMER in the last 168 hours.  Radiology/Studies:  CT Head Wo Contrast  Result Date: 05/18/2021 CLINICAL DATA:  Patient status post fall. EXAM: CT HEAD WITHOUT CONTRAST CT CERVICAL SPINE WITHOUT CONTRAST TECHNIQUE: Multidetector CT imaging of the head and cervical spine was performed following the standard protocol without intravenous contrast. Multiplanar CT image reconstructions of the cervical spine were also generated. COMPARISON:  Brain CT 09/03/2020. FINDINGS: CT HEAD FINDINGS Brain: Ventricles and sulci are appropriate for patient's age. No evidence for acute cortically based infarct, intracranial hemorrhage, mass lesion or mass-effect. Vascular: Unremarkable. Skull: No aggressive or acute appearing osseous lesions. Sinuses/Orbits: Paranasal sinuses are well aerated. Mastoid air cells are unremarkable. Orbits are unremarkable. Other: None. CT CERVICAL SPINE FINDINGS Alignment: Normal anatomic alignment. Skull base and vertebrae: Intact. Soft tissues and spinal canal: No prevertebral fluid or swelling. No visible canal hematoma. Disc levels: Multilevel degenerative disc disease most pronounced C5-6. No acute fracture. Upper chest: Postsurgical changes right lung apex. Other: None. IMPRESSION: No acute cervical spine fracture. No acute intracranial process. Electronically Signed   By: Lovey Newcomer M.D.   On: 05/18/2021 11:59   CT Cervical Spine Wo Contrast  Result Date: 05/18/2021 CLINICAL DATA:  Patient status post fall. EXAM: CT HEAD WITHOUT CONTRAST CT CERVICAL SPINE WITHOUT CONTRAST TECHNIQUE: Multidetector CT imaging of the head and cervical spine was performed following the standard protocol without intravenous contrast. Multiplanar CT image reconstructions of the cervical spine were also generated. COMPARISON:  Brain CT 09/03/2020. FINDINGS: CT HEAD  FINDINGS Brain: Ventricles and sulci are  appropriate for patient's age. No evidence for acute cortically based infarct, intracranial hemorrhage, mass lesion or mass-effect. Vascular: Unremarkable. Skull: No aggressive or acute appearing osseous lesions. Sinuses/Orbits: Paranasal sinuses are well aerated. Mastoid air cells are unremarkable. Orbits are unremarkable. Other: None. CT CERVICAL SPINE FINDINGS Alignment: Normal anatomic alignment. Skull base and vertebrae: Intact. Soft tissues and spinal canal: No prevertebral fluid or swelling. No visible canal hematoma. Disc levels: Multilevel degenerative disc disease most pronounced C5-6. No acute fracture. Upper chest: Postsurgical changes right lung apex. Other: None. IMPRESSION: No acute cervical spine fracture. No acute intracranial process. Electronically Signed   By: Lovey Newcomer M.D.   On: 05/18/2021 11:59   DG Pelvis Portable  Result Date: 05/18/2021 CLINICAL DATA:  Found on floor EXAM: PORTABLE PELVIS 1-2 VIEWS COMPARISON:  12/18/2019 FINDINGS: There is no evidence of pelvic fracture or diastasis. No pelvic bone lesions are seen. Lumbar scoliosis and degenerative change. Surgical staples in the right lower quadrant. IMPRESSION: Negative. Electronically Signed   By: Franchot Gallo M.D.   On: 05/18/2021 10:44   CT CHEST ABDOMEN PELVIS W CONTRAST  Result Date: 05/18/2021 CLINICAL DATA:  Abdominal trauma. EXAM: CT CHEST, ABDOMEN, AND PELVIS WITH CONTRAST TECHNIQUE: Multidetector CT imaging of the chest, abdomen and pelvis was performed following the standard protocol during bolus administration of intravenous contrast. CONTRAST:  181mL OMNIPAQUE IOHEXOL 300 MG/ML  SOLN COMPARISON:  February 29, 2020 FINDINGS: CT CHEST FINDINGS Cardiovascular: Mildly enlarged heart. Calcific atherosclerotic disease of the coronary arteries and aorta. No central pulmonary embolus. Mediastinum/Nodes: No enlarged mediastinal, hilar, or axillary lymph nodes. Thyroid gland, trachea,  and esophagus demonstrate no significant findings. Lungs/Pleura: Lungs are clear. No pleural effusion or pneumothorax. Musculoskeletal: No chest wall mass or suspicious bone lesions identified. CT ABDOMEN PELVIS FINDINGS Hepatobiliary: No hepatic injury or perihepatic hematoma. Gallbladder is unremarkable. Hepatic steatosis. Pancreas: Unremarkable. No pancreatic ductal dilatation or surrounding inflammatory changes. Spleen: No splenic injury or perisplenic hematoma. Adrenals/Urinary Tract: Normal adrenal glands. Bilateral nonobstructive renal calculi measuring up to 7 mm. 2.7 cm left renal cyst. Normal urinary bladder. Stomach/Bowel: Stomach is within normal limits. No evidence of bowel wall thickening, distention, or inflammatory changes. Vascular/Lymphatic: Aortic atherosclerosis and tortuosity. No enlarged abdominal or pelvic lymph nodes. Reproductive: Status post hysterectomy. No adnexal masses. Other: Fat containing periumbilical anterior abdominal wall hernia. Second fat and bowel containing infraumbilical anterior abdominal wall hernia. No CT evidence of bowel incarceration. Postsurgical changes in the abdomen. Musculoskeletal: No fracture is seen. Scoliosis and spondylosis of the spine. IMPRESSION: 1. No evidence of acute traumatic injury to the chest, abdomen or pelvis. 2. Hepatic steatosis. 3. Bilateral nonobstructive renal calculi. 4. Fat containing periumbilical anterior abdominal wall hernia. 5. Second fat and bowel containing infraumbilical anterior abdominal wall hernia. No CT evidence of bowel incarceration. 6. Aortic atherosclerosis. Aortic Atherosclerosis (ICD10-I70.0). Electronically Signed   By: Fidela Salisbury M.D.   On: 05/18/2021 12:25   DG Chest Portable 1 View  Result Date: 05/18/2021 CLINICAL DATA:  Found on floor.  Fall EXAM: PORTABLE CHEST 1 VIEW COMPARISON:  03/02/2020 FINDINGS: Heart size and mediastinal structures normal. Vascularity normal. Lungs clear without infiltrate  effusion. Surgical staples right lung apex. Left rib fractures are present which are likely acute and not seen previously. IMPRESSION: Left rib fractures. No acute cardiopulmonary abnormality. Electronically Signed   By: Franchot Gallo M.D.   On: 05/18/2021 10:35    Assessment and Plan:   Myocardial injury [name] presents following at least 12 hours found  down by her daughter in the setting of alcohol use disorder.  Cardiology was consulted regarding uptrending troponins without ECG changes.  [name] denies any anginal equivalents or palpitations leading up to her fall.  Her elevated troponin is likely in the setting of being down for over 12 hours and rhabdomyolysis.  She fortunately has not had any concerning symptoms for ACS that would indicate the need for further ischemic evaluation.  I do think given her extensive alcohol history and elevated troponin would be worth assessing her EF with an echo today as this would change medication management if she had global systolic dysfunction.  She does have her medication sent during blister packs so she may be taking them although she does not know the names of any of them.  Her alcohol use is going to be her primary ongoing issue that unfortunately she has dealt with for over 15 years. - TTE today  - check lipids/A1c  For questions or updates, please contact [place] Please consult [email or web address] for contact info under   Signed, Dion Body, MD  05/19/2021 7:47 AM

## 2021-05-18 NOTE — Progress Notes (Signed)
   05/18/21 0943  Clinical Encounter Type  Visited With Patient not available  Visit Type Trauma;Initial  Referral From Nurse  Consult/Referral To Chaplain   Chaplain responded to Level 2 trauma. Pt being treated and no support person present. No current need for chaplain. Chaplain remains available.   This note was prepared by Chaplain Resident, Dante Gang, MDiv. Chaplain remains available as needed through the on-call pager: [phone].

## 2021-05-18 NOTE — Hospital Course (Addendum)
[name] is a 80 y.o. female presenting with Fall. 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               Fall  Dehydration I Leg wounds  rhabdomyolysis Patient was found down by daughter on the floor after unknown events with a history of falls in the setting of chronic alcoholism.  Patient was initially hypertensive and tachycardic and found to have an elevated CK, patient was given fluid resuscitation with improvement in tachycardia.  Significant bruising was noted over patient's body, some of which were in old healing stages.  Head CT, CT A&P, CT cervical spine without acute abnormalities.  Patient lives alone and family was concerned about her care and safety at home.  Palliative care was involved to define goals of care; patient was evaluated PT/OT with a suggestion for SNF placement.  Psychiatry was consulted as there was concern about patient's capacity to make medical decisions, it was deemed the patient did not have capacity and family wanted her placed at SNF.  Prior to discharge, bruising was improving significantly.   Alcohol Use Disorder  Patient was on CIWA protocol with concern for alcohol withdrawal given patient's significant alcohol intake of 3 bottles of wine per day.  Patient did not require any Ativan during hospitalization.  Macrocytic Anemia  Thrombocytopenia Patient was thrombocytopenic to 68 on admission with a stable hemoglobin.  Chemical prophylaxis was initially held during admission but was reinitiated once improved to > 100.  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  Cardiology was consulted and echocardiogram was obtained, which showed entire mid  ventricle to apex is akinetic, LVEF 35%, mild to moderate mitral valve regurg, cannot exclude small PFO with left to right shunt.  Patient became short of breath after fluid resuscitation in the setting of rhabdomyolysis.  ECG noted to have T wave inversions, cardiology held off on invasive ischemic evaluation as patient is not an optimal candidate due to alcohol abuse and frequent falls (in case patient needed intervention that would require long-term DAPT).  While in the hospital, patient was started on losartan 20 mg daily, oral Lasix 20 mg daily.  Elevated TSH  subclinical hypothyroidism Patient had TSH of 7.048. T4 was obtained and it is 0.66.Repeat TSH as an outpatient.   Left Rib fracture Patient complains of mild pain with position change but no pain at rest and denies using any medication. Received Lidocaine patch PRN and Tylenol 650 q8h PRN  Hypokalemia Patient with persistent hypokalemia in the setting of new onset heart failure.  Potassium was initially within normal limits on admission, but that was in setting of rhabdomyolysis and may have been falsely elevated.  Patient presented with diarrhea prior to admission and could have possibly contributed.  Not currently on diuretics and potassium sparing medications, will need repeat and monitoring outpatient.   Issues for follow-up: Recheck BMP in the next 1-2 days to monitor potassium.  Patiently recently started on Lasix, losartan, spironolactone Can titrate to spironolactone 25 mg up as needed for hypertension. Patient to have limited echo 4 to 6 weeks from hospitalization with cardiology May consider high-quality nuclear study for ischemia evaluation if necessary from cardiology Continue cessation counseling for alcohol Consider initiation of acamprosate if patient willing. Repeat TSH in  6-8 weeks.

## 2021-05-18 NOTE — Progress Notes (Signed)
Elevated Troponin called to MD on call. Will get EKG as ordered. Patient denies shortness of breath and chest pain.

## 2021-05-18 NOTE — Progress Notes (Signed)
Orthopedic Tech Progress Note Patient Details:  [name] 09-Mar-1941 [id number] Level 2 Trauma  Patient ID: [name], female   DOB: March 29, 1941, 80 y.o.   MRN: [id number]  [name] 05/18/2021, 11:22 AM

## 2021-05-19 ENCOUNTER — Encounter: Payer: Self-pay | Admitting: Family Medicine

## 2021-05-19 ENCOUNTER — Inpatient Hospital Stay (HOSPITAL_COMMUNITY): Payer: Medicare Other

## 2021-05-19 ENCOUNTER — Encounter (HOSPITAL_COMMUNITY): Payer: Self-pay | Admitting: Family Medicine

## 2021-05-19 DIAGNOSIS — N179 Acute kidney failure, unspecified: Secondary | ICD-10-CM

## 2021-05-19 DIAGNOSIS — M6282 Rhabdomyolysis: Secondary | ICD-10-CM | POA: Diagnosis present

## 2021-05-19 DIAGNOSIS — R319 Hematuria, unspecified: Secondary | ICD-10-CM

## 2021-05-19 DIAGNOSIS — E860 Dehydration: Secondary | ICD-10-CM

## 2021-05-19 DIAGNOSIS — G319 Degenerative disease of nervous system, unspecified: Secondary | ICD-10-CM | POA: Insufficient documentation

## 2021-05-19 DIAGNOSIS — K439 Ventral hernia without obstruction or gangrene: Secondary | ICD-10-CM | POA: Diagnosis present

## 2021-05-19 DIAGNOSIS — E778 Other disorders of glycoprotein metabolism: Secondary | ICD-10-CM | POA: Diagnosis present

## 2021-05-19 DIAGNOSIS — D539 Nutritional anemia, unspecified: Secondary | ICD-10-CM

## 2021-05-19 DIAGNOSIS — T796XXA Traumatic ischemia of muscle, initial encounter: Secondary | ICD-10-CM

## 2021-05-19 DIAGNOSIS — K529 Noninfective gastroenteritis and colitis, unspecified: Secondary | ICD-10-CM | POA: Insufficient documentation

## 2021-05-19 DIAGNOSIS — T148XXA Other injury of unspecified body region, initial encounter: Secondary | ICD-10-CM | POA: Diagnosis present

## 2021-05-19 DIAGNOSIS — Z7189 Other specified counseling: Secondary | ICD-10-CM

## 2021-05-19 DIAGNOSIS — I700 Atherosclerosis of aorta: Secondary | ICD-10-CM

## 2021-05-19 DIAGNOSIS — W19XXXA Unspecified fall, initial encounter: Secondary | ICD-10-CM

## 2021-05-19 DIAGNOSIS — R7989 Other specified abnormal findings of blood chemistry: Secondary | ICD-10-CM | POA: Diagnosis present

## 2021-05-19 DIAGNOSIS — I351 Nonrheumatic aortic (valve) insufficiency: Secondary | ICD-10-CM

## 2021-05-19 DIAGNOSIS — R8281 Pyuria: Secondary | ICD-10-CM

## 2021-05-19 DIAGNOSIS — M5030 Other cervical disc degeneration, unspecified cervical region: Secondary | ICD-10-CM | POA: Diagnosis present

## 2021-05-19 DIAGNOSIS — D72829 Elevated white blood cell count, unspecified: Secondary | ICD-10-CM

## 2021-05-19 DIAGNOSIS — E538 Deficiency of other specified B group vitamins: Secondary | ICD-10-CM | POA: Diagnosis present

## 2021-05-19 DIAGNOSIS — R748 Abnormal levels of other serum enzymes: Secondary | ICD-10-CM

## 2021-05-19 DIAGNOSIS — R778 Other specified abnormalities of plasma proteins: Secondary | ICD-10-CM

## 2021-05-19 DIAGNOSIS — R531 Weakness: Secondary | ICD-10-CM

## 2021-05-19 DIAGNOSIS — D696 Thrombocytopenia, unspecified: Secondary | ICD-10-CM

## 2021-05-19 DIAGNOSIS — K709 Alcoholic liver disease, unspecified: Secondary | ICD-10-CM

## 2021-05-19 DIAGNOSIS — Z789 Other specified health status: Secondary | ICD-10-CM | POA: Diagnosis present

## 2021-05-19 DIAGNOSIS — S0010XA Contusion of unspecified eyelid and periocular area, initial encounter: Secondary | ICD-10-CM

## 2021-05-19 DIAGNOSIS — I361 Nonrheumatic tricuspid (valve) insufficiency: Secondary | ICD-10-CM

## 2021-05-19 DIAGNOSIS — E871 Hypo-osmolality and hyponatremia: Secondary | ICD-10-CM | POA: Diagnosis present

## 2021-05-19 DIAGNOSIS — Z515 Encounter for palliative care: Secondary | ICD-10-CM

## 2021-05-19 DIAGNOSIS — I340 Nonrheumatic mitral (valve) insufficiency: Secondary | ICD-10-CM

## 2021-05-19 DIAGNOSIS — R7401 Elevation of levels of liver transaminase levels: Secondary | ICD-10-CM | POA: Diagnosis present

## 2021-05-19 DIAGNOSIS — I5A Non-ischemic myocardial injury (non-traumatic): Secondary | ICD-10-CM | POA: Diagnosis present

## 2021-05-19 HISTORY — DX: Nutritional anemia, unspecified: D53.9

## 2021-05-19 HISTORY — DX: Atherosclerosis of aorta: I70.0

## 2021-05-19 HISTORY — DX: Ventral hernia without obstruction or gangrene: K43.9

## 2021-05-19 HISTORY — DX: Other cervical disc degeneration, unspecified cervical region: M50.30

## 2021-05-19 LAB — CBC
HCT: 32.7 % — ABNORMAL LOW (ref 36.0–46.0)
Hemoglobin: 10.5 g/dL — ABNORMAL LOW (ref 12.0–15.0)
MCH: 32.4 pg (ref 26.0–34.0)
MCHC: 32.1 g/dL (ref 30.0–36.0)
MCV: 100.9 fL — ABNORMAL HIGH (ref 80.0–100.0)
Platelets: 68 10*3/uL — ABNORMAL LOW (ref 150–400)
RBC: 3.24 MIL/uL — ABNORMAL LOW (ref 3.87–5.11)
RDW: 15.2 % (ref 11.5–15.5)
WBC: 12.3 10*3/uL — ABNORMAL HIGH (ref 4.0–10.5)
nRBC: 0.2 % (ref 0.0–0.2)

## 2021-05-19 LAB — ECHOCARDIOGRAM COMPLETE
AR max vel: 2.31 cm2
AV Area VTI: 2.09 cm2
AV Area mean vel: 2.14 cm2
AV Mean grad: 2 mmHg
AV Peak grad: 3.3 mmHg
Ao pk vel: 0.91 m/s
Area-P 1/2: 4.89 cm2
Height: 60 in
S' Lateral: 2.5 cm
Weight: 1587.31 oz

## 2021-05-19 LAB — COMPREHENSIVE METABOLIC PANEL
ALT: 46 U/L — ABNORMAL HIGH (ref 0–44)
AST: 118 U/L — ABNORMAL HIGH (ref 15–41)
Albumin: 3.3 g/dL — ABNORMAL LOW (ref 3.5–5.0)
Alkaline Phosphatase: 61 U/L (ref 38–126)
Anion gap: 19 — ABNORMAL HIGH (ref 5–15)
BUN: 22 mg/dL (ref 8–23)
CO2: 17 mmol/L — ABNORMAL LOW (ref 22–32)
Calcium: 8.1 mg/dL — ABNORMAL LOW (ref 8.9–10.3)
Chloride: 102 mmol/L (ref 98–111)
Creatinine, Ser: 1.36 mg/dL — ABNORMAL HIGH (ref 0.44–1.00)
GFR, Estimated: 40 mL/min — ABNORMAL LOW (ref >60)
Glucose, Bld: 83 mg/dL (ref 70–99)
Potassium: 4 mmol/L (ref 3.5–5.1)
Sodium: 138 mmol/L (ref 135–145)
Total Bilirubin: 1.3 mg/dL — ABNORMAL HIGH (ref 0.3–1.2)
Total Protein: 5.5 g/dL — ABNORMAL LOW (ref 6.5–8.1)

## 2021-05-19 LAB — T4, FREE: Free T4: 0.66 ng/dL (ref 0.61–1.12)

## 2021-05-19 LAB — TSH: TSH: 7.048 u[IU]/mL — ABNORMAL HIGH (ref 0.350–4.500)

## 2021-05-19 LAB — TROPONIN I (HIGH SENSITIVITY): Troponin I (High Sensitivity): 745 ng/L (ref <18)

## 2021-05-19 LAB — CK: Total CK: 3028 U/L — ABNORMAL HIGH (ref 38–234)

## 2021-05-19 MED ORDER — PERFLUTREN LIPID MICROSPHERE
1.0000 mL | INTRAVENOUS | Status: AC | PRN
  Administered 2021-05-19: 5 mL via INTRAVENOUS
  Filled 2021-05-19: qty 10

## 2021-05-19 MED ORDER — LORAZEPAM 1 MG PO TABS
1.0000 mg | ORAL_TABLET | ORAL | Status: DC | PRN
  Administered 2021-05-19: 2 mg via ORAL
  Filled 2021-05-19: qty 2

## 2021-05-19 MED ORDER — SODIUM CHLORIDE 0.9 % IV SOLN
1.0000 g | INTRAVENOUS | Status: DC
  Administered 2021-05-19: 1 g via INTRAVENOUS
  Filled 2021-05-19: qty 1

## 2021-05-19 MED ORDER — LORAZEPAM 2 MG/ML IJ SOLN: 1.0000 mg | INTRAMUSCULAR | Status: DC | PRN

## 2021-05-19 MED ORDER — ACETAMINOPHEN 325 MG PO TABS: 650.0000 mg | ORAL_TABLET | Freq: Three times a day (TID) | ORAL | Status: DC | PRN

## 2021-05-19 MED ORDER — ENSURE ENLIVE PO LIQD
237.0000 mL | Freq: Two times a day (BID) | ORAL | Status: DC
  Administered 2021-05-19 – 2021-05-20 (×2): 237 mL via ORAL

## 2021-05-19 MED ORDER — LIDOCAINE 5 % EX PTCH
1.0000 | MEDICATED_PATCH | Freq: Two times a day (BID) | CUTANEOUS | Status: DC | PRN
  Administered 2021-05-20: 1 via TRANSDERMAL
  Filled 2021-05-19: qty 1

## 2021-05-19 MED ORDER — VITAMIN B-12 1000 MCG PO TABS
1000.0000 ug | ORAL_TABLET | Freq: Every day | ORAL | Status: DC
  Administered 2021-05-19 – 2021-05-24 (×6): 1000 ug via ORAL
  Filled 2021-05-19 (×6): qty 1

## 2021-05-19 MED ORDER — SODIUM CHLORIDE 0.9 % IV SOLN
INTRAVENOUS | Status: AC
Start: 2021-05-19 — End: 2021-05-20

## 2021-05-19 NOTE — Progress Notes (Signed)
Cardiology contacted regarding abnormal echo results

## 2021-05-19 NOTE — Progress Notes (Signed)
Physical Therapy Note  PT eval complete with full note to follow;  Recommend SNF for rehab for transition out of hospital;  Very unsteady with gait and needs inpt rehab to maximize independence and safety with mobility and ADLs, and to decr fall risk in order to dc home alone;  Will follow,   Roney Marion, PT  Acute Rehabilitation Services Pager [phone] Office [phone]

## 2021-05-19 NOTE — Progress Notes (Signed)
FPTS Interim Progress Note Abnormal echocardiogram and EKG results  S: Patient evaluated at bedside this evening after abnormal EKG results, concerning for ischemia.  Patient denies any chest pain or any other discomfort but complains of knee pain.  Instructed to let the nurse know immediately if she is having any acute chest pain.  O: BP (!) 147/90 (BP Location: Right Arm)   Pulse 78   Temp 97.9 F (36.6 C) (Oral)   Resp 20   Ht 5' (1.524 m)   Wt 99 lb 3.3 oz (45 kg)   SpO2 91%   BMI 19.38 kg/m     A/P: EKG shows normal sinus rhythm with prolonged QTC and T wave abnormality, likely inferior and anterior lateral ischemia.  Echocardiogram is concerning for Takotsubo cardiomyopathy versus chronic alcohol cardiomyopathy showing ejection fraction ~35% --Cardiology consulted and informed about abnormal results --Stat EKG ordered --Cardiology plans to see patient for evening rounds.  Honor Junes, MD 05/19/2021, 6:32 PM PGY-1, [place] [facility] Service pager [phone]

## 2021-05-19 NOTE — Progress Notes (Addendum)
Family Medicine Teaching Service Daily Progress Note Intern Pager: [phone]  Patient name: [name] Medical record number: [id number] Date of birth: 04-22-1941 Age: 80 y.o. Gender: female  Primary Care Provider: Asencion Noble, MD Consultants: Cardiology, Wound care Code Status: Full  Pt Overview and Major Events to Date:  05/18/2021: Admitted to FPTS  Assessment and Plan: [name] is a 80 year old female presenting after a fall.  PMH is significant for anxiety, arthritis, alcohol use disorder, carcinoid bronchial adenoma of right lung, depression, GERD, kidney stones, liver disease, osteoporosis, subarachnoid hemorrhage, macrocytic anemia and thrombocytopenia.  Fall I Agitation Patient does not remember about her recent fall but states she has had multiple falls in past.  Eager to return back home.  She was agitated and confused this morning, RN and daughter redirected.  She is alert, awake, oriented to time place and person now.  Denies headache, any recent change in vision.  Daughter concerned about patient returning back home and living by herself.  Patient has decision-making capacity but would talk to palliative care to define goals of care and to evaluate any need of home health. --PT/OT eval and treat --Palliative care consult for goals of care --Delirium precautions --Fall precautions --CBC, BMP in am  Thrombocytopenia I Macrocytic Anemia I Alcohol use disorder Platelets 101>68. Hbg 12.5>10.5.  No overt signs of bleeding, no focal neurological deficits. Last CIWA 6, patient was calm and denying any cravings or alcohol or withdrawal symptoms this morning. --Hold Lovenox, reevaluate tom am. --CBC in am --c/w CIWA, if needed will start Ativan PRN --If any focal neurological deficits, will consider repeat CT head --SCD's --Alcohol cessation counseling   Superficial lacerations, leg wounds Both legs wrapped in clean gauze.  Multiple healing wounds from last fall.   Patient denies any pain but tenderness on manipulation. --Wound care consulted, waiting for recommendations   HTN SBP 105-208, DBP 75-129.  --c/w Metoprolol 100 mg --Monitor closely  AKI Scr 1.55 >1.36, trending down. S/p IV fluids --Monitor closely --BMP in am --mIVF @100   Asymptomatic UTI Denies any burning, pain or blood in urine. --d/c Ceftriaxone   Elevated troponin's I Myocardial injury Troponin 45>115> [phone].  Cardiology consulted and they are considering elevated troponins likely in the setting of being down for over 12 hours and rhabdomyolysis and no acute concern for ACS. --Cardiology following, appreciate their recommendations  --f/u Echocardiogram --f/u HbA1c --f/u Lipid panel   Rhabdomyolysis Patient presented with CK 1071>3028, --Trend CK --Monitor closely --mIVF @ 100  Elevated TSH Patient had TSH of 7.048. T4 was obtained and it is 0.66. --Repeat TSH as an outpatient in 2-3 months.  Left Rib fracture Patient complains of mild pain with position change but no pain at rest and denies using any medication. --Lidocaine patch PRN --Tylenol 650 q8h PRN   FEN/GI: Regular diet PPx: SCD's   Status is: Inpatient  Remains inpatient appropriate because:IV treatments appropriate due to intensity of illness or inability to take PO  Dispo: The patient is from: Home              Anticipated d/c is to: Home              Patient currently is not medically stable to d/c.   Difficult to place patient No        Subjective:  Patient was agitated this morning with CIWA of 6, responded to redirection by RN and daughter .  Patient evaluated at bedside this morning with daughter present in the  room.  Complains of mild left tenderness and eager to return home.  Denies any other complaints  Objective: Temp:  [97.6 F (36.4 C)-98.3 F (36.8 C)] 98.3 F (36.8 C) (06/19 0539) Pulse Rate:  [79-121] 88 (06/19 0539) Resp:  [17-28] 17 (06/19 0539) BP:  (105-208)/(75-126) 120/94 (06/19 0539) SpO2:  [95 %-100 %] 96 % (06/19 0539) FiO2 (%):  [21 %] 21 % (06/18 1510) Physical Exam: General: Elderly female sitting comfortably in bed, NAD Cardiovascular: Regular rate and rhythm Respiratory: Clear to auscultation bilaterally Abdomen: Soft, nontender, nondistended, bowel sounds present, reducible umbilical hernia present Extremities: Multiple bruises all over body and wounds on bilateral knees Neuro: Alert, awake, oriented to time place and person.  No focal neurological deficits noted  Laboratory: Recent Labs  Lab 05/18/21 1000 05/19/21 0221  WBC 15.4* 12.3*  HGB 12.5 10.5*  HCT 39.3 32.7*  PLT 101* 68*   Recent Labs  Lab 05/18/21 1000 05/19/21 0221  NA 143 138  K 4.1 4.0  CL 110 102  CO2 15* 17*  BUN 22 22  CREATININE 1.55* 1.36*  CALCIUM 8.7* 8.1*  PROT 6.4* 5.5*  BILITOT 1.6* 1.3*  ALKPHOS 85 61  ALT 43 46*  AST 116* 118*  GLUCOSE 162* 83    Imaging/Diagnostic Tests: No results found.   Honor Junes, MD 05/19/2021, 11:12 AM PGY-1, [place] Intern pager: [phone], text pages welcome

## 2021-05-19 NOTE — Consult Note (Signed)
Consultation Note Date: 05/19/2021   Patient Name: [name]  DOB: Jul 30, 1941  MRN: [id number]  Age / Sex: 80 y.o., female  PCP: Asencion Noble, MD Referring Physician: McDiarmid, Blane Ohara, MD  Reason for Consultation: Establishing goals of care and Psychosocial/spiritual support  HPI/Patient Profile: 80 y.o. female   admitted on 05/18/2021 with h/o fall.  Last spoke with daughter on phone at 3 PM.  Daughter found early this morning on floor.  Patient does not remember what caused fall.   Action tremor in both legs and arms on exam.  Ataxia with finger to nose.  No Asterixis.  Has bruising on around eyes bilaterally and left forehead, left shoulder, both arms and legs.  Patient's duaghter indicates some bruises on arms and legs are old.   PMH is significant for Anxiety, Arthritis, Alcohol Use Disorder, Carcinoid bronchial adenoma of right lung ([place]), Depression, GERD, Kidney stones, Liver disease, Osteoporosis, Subarachnoid Hemmorage, Macrocytic Anemia, & Thrombocytopenia.      Long history of alcohol use disorder and falls.  Gait has been declining over past year.    Patient and family face treatment option decisions, advanced directive decisions and anticipatory care needs          Clinical Assessment and Goals of Care:  This NP Wadie Lessen reviewed medical records, received report from team, assessed the patient and then meet spoke to two daughter to discuss diagnosis, prognosis, GOC, EOL wishes disposition and options.   Concept of Palliative Care was introduced as specialized medical care for people and their families living with serious illness.  If focuses on providing relief from the symptoms and stress of a serious illness.  The goal is to improve quality of life for both the patient and the family. Values and goals of care important to patient and family were attempted to be elicited.  Created  space and opportunity for patient  and family to explore thoughts and feelings regarding current medical situation.  Patient is confused to place and situation  Patient speaks to her home situation where she is "just fine"   She has help to go to the grocery store and continue to prepare food for herself.  She does indorse some increasing needs bathing and dressing.  Her daughters however verbalize great concern over their mother's situation.  Concern over the physical safety and,  home is in great disarray. Daughters verbalize concern over her cyclical history of alcohol misuse, attempts at rehabilitation, continued physical, functional and cognitive decline over the last several years. Multiple falls.  She does have 3 hours a day of personal care services provided to her however she refuses to let the caregivers into her home.  They take a drive to the grocery store every day.  Daughters speak to unsuccessful placements over the last year in independent living facilities, 2/2 to aggressive and abusive behavior to staff and other residents.  She has been banned from certain grocery stores in her town.    Education offered on capacity vs competency.  The capacity to make wants own decisions is fundamental to the ethical principal of respect for autonomy.  The main determinant of capacity is cognition.  Capacity describes a person's ability to make a decision.  Capacity refers to the ability to utilize information about an illness and proposed treatment options to make a choice that is congruent with one's own values and preferences.   The are four principle decision-making abilities that constitute capacity are: understanding, expressing a choice, appreciation and reasoning.   Understanding, is to know the meaning of the information given.  Individuals should be able to clearly communicate a choice when presented with multiple treatment options.  Individuals should be able to clearly communicate a  choice when presented with multiple options.   Appreciation is reflected in not just knowing the facts  but applying those facts to one's own life is essential to make the decision authentic.  The ability to appreciate facts refers to people's ability to recognize how facts are relevant to themselves, and how it affects them now and could affect them  in the future.      The ability to Reason is the ability to compare options and to infer consequences of choice.  In my opinion this patient does not have the capacity to make medical decisions for herself.  It will be important to clarify patient's capacity and competency through out this hospitalization, specifically as it relates to a safe discharge plan.    Continued education  with both daughters regarding advanced directives.  Concepts specific to code status, artifical feeding and hydration, continued IV antibiotics and rehospitalization was had.  The difference between a aggressive medical intervention path  and a palliative comfort care path for this patient at this time was had.  Patient's daughters tell me patient has a living will reflecting desire for all life prolonging measures    MOST form introduced   Questions and concerns addressed.  Patient  encouraged to call with questions or concerns.     PMT will continue to support holistically.         Daughter to bring in paper work supporting HPOA and [place]   Two daughters are HPOAs   SUMMARY OF RECOMMENDATIONS    Code Status/Advance Care Planning: Full code   Symptom Management:  Per attending  Palliative Prophylaxis:  Aspiration, Bowel Regimen, Delirium Protocol, Frequent Pain Assessment, and Oral Care  Additional Recommendations (Limitations, Scope, Preferences): Full Scope Treatment  Psycho-social/Spiritual:  Desire for further Chaplaincy support:yes   Prognosis:  Unable to determine  Discharge Planning: To Be Determined      Primary Diagnoses: Present  on Admission:  Fall  Myocardial injury  Rhabdomyolysis  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[place])  (Resolved) Abdominal wall hernia, periumbilical, fat-containing  Atherosclerosis of aorta (H[facility])  Multiple skin tears  Multiple Bruising sites  Poor historian  (Resolved) Hyponatremia  Acute renal injury ([place])  Transaminitis  Bilirubinemia  Hypoproteinemia (HCC)  Elevated troponin  Vitamin B12 deficiency  Thrombocytopenia (H[facility])  TSH elevation  Pyuria  Hematuria  Dehydration  Degenerative disc disease, cervical  Bilateral Periorbital ecchymosis   I have reviewed the medical record, interviewed the patient and family, and examined the patient. The following aspects are pertinent.  Past Medical History:  Diagnosis Date   Abdominal wall hernia, periumbilical, fat-containing 05/19/2021   CT AP 05/18/21: Fat containing periumbilical anterior abdominal wall hernia.  Second fat and bowel containing infraumbilical anterior abdominal wall hernia. No CT evidence of bowel incarceration.   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   Macrocytic anemia 05/19/2021   Osteoporosis    Treated with denosumab   Physical deconditioning    Recurrent falls    Recurrent kidney stones    Calcium oxalate monohydrate on stone analysis 05/2020 at [facility] urology   Seizure [facility])    started on Keppra after [facility]   Subarachnoid hemorrhage ([place]) 12/19/2019   Social History   Socioeconomic History   Marital status: Widowed    Spouse name: Not on file   Number of children: Not on file   Years of  education: Not on file   Highest education level: Not on file  Occupational History   Not on file  Tobacco Use   Smoking status: Not on file   Smokeless tobacco: Not on file  Substance and Sexual Activity   Alcohol use: Not on file   Drug use: Not on file   Sexual activity: Not on file  Other Topics Concern   Not on file  Social History Narrative   Not on file   Social Determinants of Health   Financial Resource Strain: Not on file  Food Insecurity: Not on file  Transportation Needs: Not on file  Physical Activity: Not on file  Stress: Not on file  Social Connections: Not on file   History reviewed. No pertinent family history. Scheduled Meds:  calcium-vitamin D  1 tablet Oral Q breakfast   cycloSPORINE  1 drop Both Eyes BID   escitalopram  10 mg Oral q AM   feeding supplement  237 mL Oral BID BM   folic acid  1 mg Oral Daily   metoprolol succinate  100 mg Oral q AM   multivitamin with minerals  1 tablet Oral Daily   thiamine  100 mg Oral q AM   vitamin B-12  1,000 mcg Oral Daily   Continuous Infusions:  sodium chloride 100 mL/hr at 05/19/21 1218   lactated ringers 125 mL/hr at 05/19/21 0700   PRN Meds:.acetaminophen, lidocaine Medications Prior to Admission:  Prior to Admission medications   Medication Sig Start Date End Date Taking? Authorizing Provider  Calcium Carb-Cholecalciferol (CALCIUM + D3 PO) Take 1 tablet by mouth daily with breakfast.   Yes [provider]  Cyanocobalamin (VITAMIN DEFICIENCY SYSTEM-B12) 1000 MCG/ML KIT Inject 1,000 mcg into the skin every 30 (thirty) days.   Yes [provider]  denosumab (PROLIA) 60 MG/ML SOSY injection Inject 60 mg into the skin every 6 (six) months.   Yes [provider]  escitalopram (LEXAPRO) 10 MG tablet Take 10 mg by mouth in the morning. 04/30/21  Yes [provider]  metoprolol succinate (TOPROL-XL) 50 MG 24 hr tablet Take 50 mg by mouth in the morning. 04/30/21  Yes [provider]  potassium chloride (KLOR-CON) 10 MEQ tablet Take 10 mEq by mouth in the morning. 04/30/21  Yes [provider]  RESTASIS 0.05 % ophthalmic emulsion Place 1 drop into both eyes 2 (two) times daily. 04/09/21  Yes [provider]  thiamine 100 MG tablet Take 100 mg by mouth in the morning.  04/30/21  Yes [provider]  levETIRAcetam (KEPPRA) 500 MG tablet Take 500 mg by mouth in the morning. 04/30/21 05/18/21 Yes [provider]  cyanocobalamin (,VITAMIN B-12,) 1000 MCG/ML injection Inject 1,000 mcg into the skin every 30 (thirty) days. 12/06/20   [provider]   Allergies  Allergen Reactions   Clindamycin/Lincomycin Rash   Doxycycline Rash   Review of Systems  Constitutional: Negative.    Physical Exam Cardiovascular:     Rate and Rhythm: Normal rate.  Skin:    General: Skin is warm and dry.     Comments: -extensive bruising -face/neck arms/legs  Neurological:     Mental Status: She is alert. She is disoriented.  Psychiatric:        Behavior: Behavior is agitated (easily agitaed when asking questions regarding her home environment).        Cognition and Memory: Memory is impaired.    Vital Signs: BP (!) 120/94 (BP Location: Right Arm)   Pulse 88   Temp 98.3 F (36.8 C) (Oral)   Resp 17   Ht 5' (1.524 m)   Wt 45 kg   SpO2 96%   BMI 19.38 kg/m  Pain Scale: 0-10   Pain Score: 0-No pain   SpO2: SpO2: 96 % O2 Device:SpO2: 96 % O2 Flow Rate: .O2 Flow Rate (L/min): 0 L/min  IO: Intake/output summary:  Intake/Output Summary (Last 24 hours) at 05/19/2021 1344 Last data filed at 05/19/2021 0700 Gross per 24 hour  Intake 2486.77 ml  Output --  Net 2486.77 ml    LBM: Last BM Date: 05/18/21 Baseline Weight: Weight: 45 kg Most recent weight: Weight: 45 kg     Palliative Assessment/Data: 40 %    Discussed with Dr Demaris Callander and bedside RN  Time In: 1500 Time Out: 1700 Time Total: 120 minutes Greater than 50%  of this  time was spent counseling and coordinating care related to the above assessment and plan.  Signed by: Wadie Lessen, NP   Please contact Palliative Medicine Team phone at [phone] for questions and concerns.  For individual provider: See Shea Evans

## 2021-05-19 NOTE — Progress Notes (Signed)
Patient confused, oriented to self and agitated and wants to change clothes and go to [place] to get something despite daughter and this RN reorienting patient that pt is in [facility] and not in [place].  Pt's daughter patiently and calmly taking down list of things of what patient wants. CIWA=6.   On-call FMTS was made aware thru text paged and Dr. Posey Pronto called back and instructed to monitor CIWA and if it gets worse to call back for orders.  Will closely monitor and endorse to day shift RN accordingly.

## 2021-05-19 NOTE — Progress Notes (Signed)
Reported to bedside to evaluate patient for night shift. Patient was sleeping in bed in NAD. Daughter was present at bedside. She reported that her mom had been given Ativan earlier for CIWA scoring. No complaints at time of visit. Patient was resting comfortably with RRR on exam. Notable for several areas of ecchymosis including bilateral eyes. No signs of respiratory distress, patient was on RA.  Vitals:   05/19/21 2012  BP: (!) 150/94  Pulse: 75  Resp: 17  Temp: 97.8 F (36.6 C)  SpO2: 94%   [name], MD  [place], PGY-2  [place] Intern Pager [phone]

## 2021-05-19 NOTE — Progress Notes (Signed)
Pt confused and disoriented, thinks she is at her house and requires all day long redirection. Very abrupt and can be very easily agitated. CIWA protocol implemented and pt medicated with 2mg  po ativan this evening for anxiety and restlessness

## 2021-05-19 NOTE — Evaluation (Signed)
Physical Therapy Evaluation Patient Details Name: [name] MRN: [id number] DOB: 02-22-1941 Today's Date: 05/19/2021   History of Present Illness  [name] is a 80 y.o. 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  Clinical Impression   Pt admitted with above diagnosis. Comes from home where pt lives alone; Tells me she manages independently, and has a RW, but does not use it frequently (today, she tells me she will use it more going forward); Presents to PT with decr balance; generalized weakness, unsteadiness with amb with significant high fall risk; REcommend post-acute rehab to maximize independence and safety with mobility and ADLs prior to dc home; Also noted alcohol use disorder, and it is worth considering an inpt substance abuse rehab center after physical rehab;  Pt currently with functional limitations due to the deficits listed below (see PT Problem List). Pt will benefit from skilled PT to increase their independence and safety with mobility to allow discharge to the venue listed below.       Follow Up Recommendations SNF;Supervision/Assistance - 24 hour; Discussed recommendation for inpt physical rehab at length with pt, who tells me she would rather not stay at a rehab center overnight; Unfortunately, given her significant risk for falls, I must still recommend SNF for rehab    Equipment Recommendations  Rolling walker with 5" wheels;3in1 (PT)    Recommendations for Other Services OT consult (as ordered)     Precautions / Restrictions Precautions Precautions: Fall      Mobility  Bed Mobility Overal bed mobility: Needs Assistance Bed Mobility: Supine to Sit      Supine to sit: Min assist     General bed mobility comments: min handheld assist to pull to sit; increased time and needing cues to scoot forward to get her feet stable on teh floor    Transfers Overall transfer level: Needs assistance Equipment used: 1 person hand held assist;Rolling walker (2 wheeled) Transfers: Sit to/from Stand Sit to Stand: Mod assist         General transfer comment: Mod assist to rise and steady; cues for hand placement  Ambulation/Gait Ambulation/Gait assistance: Min assist;Mod assist Gait Distance (Feet): 25 Feet Assistive device: Rolling walker (2 wheeled);1 person hand held assist Gait Pattern/deviations: Step-through pattern;Decreased step length - right;Decreased step length - left;Trunk flexed Gait velocity: slowed   General Gait Details: Erratic step width throughout amb and noting a tendency ot R lean today; walked with handheld assist to the bathroom, then with RW; mostly mod assist for steadiness during amb  Stairs            Wheelchair Mobility    Modified Rankin (Stroke Patients Only)       Balance Overall balance assessment: Needs assistance   Sitting balance-Leahy Scale: Fair       Standing balance-Leahy Scale: Poor                               Pertinent Vitals/Pain Pain Assessment: Faces Faces Pain Scale: Hurts little more Pain Location: specifically L lower ribs; otherwise also generalized pain with movement Pain Descriptors / Indicators: Aching;Grimacing;Guarding Pain Intervention(s): Monitored during session    Home Living Family/patient expects to be discharged to:: Private residence Living  Arrangements: Alone Available Help at Discharge: Family;Friend(s);Other (Comment) (a friend drives her for shopping) Type of Home: House Home Access: Stairs to enter Entrance Stairs-Rails: Right;Left Entrance Stairs-Number of Steps: 6 (front entrance with rails; 2 side entrance no rails) Home Layout:  Multi-level;Able to live on main level with bedroom/bathroom Home Equipment: Walker - 2 wheels (does not use it regularly)      Prior Function Level of Independence: Independent;Independent with assistive device(s)         Comments: Tells me she is managing independently, uses RW prn; a friend drives her for shopping, etc     Hand Dominance        Extremity/Trunk Assessment   Upper Extremity Assessment Upper Extremity Assessment: Defer to OT evaluation (very sore and bruised L shoulder)    Lower Extremity Assessment Lower Extremity Assessment: Generalized weakness    Cervical / Trunk Assessment Cervical / Trunk Assessment: Kyphotic  Communication   Communication: No difficulties  Cognition Arousal/Alertness: Awake/alert Behavior During Therapy: WFL for tasks assessed/performed;Restless (tending to fidget) Overall Cognitive Status: Impaired/Different from baseline Area of Impairment: Orientation;Attention;Memory;Safety/judgement;Awareness                 Orientation Level:  (occasionally needing reminders that sheis in the hospital) Current Attention Level: Sustained (easliy distracted) Memory: Decreased short-term memory   Safety/Judgement: Decreased awareness of safety;Decreased awareness of deficits Awareness: Intellectual          General Comments General comments (skin integrity, edema, etc.): Bruising and abrasions all over body, larger areas of ecchymosis on L    Exercises     Assessment/Plan    PT Assessment Patient needs continued PT services  PT Problem List Decreased strength;Decreased range of motion;Decreased activity tolerance;Decreased balance;Decreased mobility;Decreased coordination;Decreased cognition;Decreased knowledge of use of DME;Decreased safety awareness;Decreased knowledge of precautions;Decreased skin integrity;Pain       PT Treatment Interventions DME instruction;Gait training;Stair training;Functional mobility  training;Therapeutic activities;Therapeutic exercise;Balance training;Neuromuscular re-education;Cognitive remediation;Patient/family education;Wheelchair mobility training    PT Goals (Current goals can be found in the Care Plan section)  Acute Rehab PT Goals Patient Stated Goal: to get home PT Goal Formulation: Patient unable to participate in goal setting Time For Goal Achievement: 06/02/21 Potential to Achieve Goals: Good    Frequency Min 2X/week   Barriers to discharge Decreased caregiver support Lives alone    Co-evaluation               AM-PAC PT "6 Clicks" Mobility  Outcome Measure Help needed turning from your back to your side while in a flat bed without using bedrails?: A Little Help needed moving from lying on your back to sitting on the side of a flat bed without using bedrails?: A Lot Help needed moving to and from a bed to a chair (including a wheelchair)?: A Lot Help needed standing up from a chair using your arms (e.g., wheelchair or bedside chair)?: A Lot Help needed to walk in hospital room?: A Lot Help needed climbing 3-5 steps with a railing? : A Lot 6 Click Score: 13    End of Session Equipment Utilized During Treatment: Gait belt Activity Tolerance: Patient tolerated treatment well Patient left: in chair;with call bell/phone within reach;with chair alarm set;with family/visitor present Nurse Communication: Mobility status PT Visit Diagnosis: Unsteadiness on feet (R26.81);Other abnormalities of gait and mobility (R26.89);History of falling (Z91.81)    Time: 6160-7371 PT Time Calculation (min) (ACUTE ONLY): 45 min   Charges:   PT Evaluation $PT Eval Moderate Complexity: 1 Mod PT  Treatments $Gait Training: 8-22 mins $Therapeutic Activity: 8-22 mins        Roney Marion, [place]  Acute Rehabilitation Services Pager [phone] Office [place] 05/19/2021, 3:34 PM

## 2021-05-20 ENCOUNTER — Encounter (HOSPITAL_COMMUNITY): Payer: Self-pay | Admitting: Family Medicine

## 2021-05-20 DIAGNOSIS — N183 Chronic kidney disease, stage 3 unspecified: Secondary | ICD-10-CM

## 2021-05-20 DIAGNOSIS — F1027 Alcohol dependence with alcohol-induced persisting dementia: Secondary | ICD-10-CM

## 2021-05-20 DIAGNOSIS — I340 Nonrheumatic mitral (valve) insufficiency: Secondary | ICD-10-CM | POA: Diagnosis present

## 2021-05-20 DIAGNOSIS — Z789 Other specified health status: Secondary | ICD-10-CM

## 2021-05-20 DIAGNOSIS — W19XXXD Unspecified fall, subsequent encounter: Secondary | ICD-10-CM

## 2021-05-20 DIAGNOSIS — I429 Cardiomyopathy, unspecified: Secondary | ICD-10-CM

## 2021-05-20 DIAGNOSIS — S2242XA Multiple fractures of ribs, left side, initial encounter for closed fracture: Secondary | ICD-10-CM

## 2021-05-20 DIAGNOSIS — T796XXD Traumatic ischemia of muscle, subsequent encounter: Secondary | ICD-10-CM

## 2021-05-20 LAB — CBC WITH DIFFERENTIAL/PLATELET
Abs Immature Granulocytes: 0.09 10*3/uL — ABNORMAL HIGH (ref 0.00–0.07)
Basophils Absolute: 0 10*3/uL (ref 0.0–0.1)
Basophils Relative: 0 %
Eosinophils Absolute: 0 10*3/uL (ref 0.0–0.5)
Eosinophils Relative: 0 %
HCT: 34 % — ABNORMAL LOW (ref 36.0–46.0)
Hemoglobin: 11 g/dL — ABNORMAL LOW (ref 12.0–15.0)
Immature Granulocytes: 1 %
Lymphocytes Relative: 8 %
Lymphs Abs: 1 10*3/uL (ref 0.7–4.0)
MCH: 32.4 pg (ref 26.0–34.0)
MCHC: 32.4 g/dL (ref 30.0–36.0)
MCV: 100.3 fL — ABNORMAL HIGH (ref 80.0–100.0)
Monocytes Absolute: 0.8 10*3/uL (ref 0.1–1.0)
Monocytes Relative: 7 %
Neutro Abs: 10.3 10*3/uL — ABNORMAL HIGH (ref 1.7–7.7)
Neutrophils Relative %: 84 %
Platelets: 63 10*3/uL — ABNORMAL LOW (ref 150–400)
RBC: 3.39 MIL/uL — ABNORMAL LOW (ref 3.87–5.11)
RDW: 15.1 % (ref 11.5–15.5)
WBC: 12.2 10*3/uL — ABNORMAL HIGH (ref 4.0–10.5)
nRBC: 0.2 % (ref 0.0–0.2)

## 2021-05-20 LAB — BASIC METABOLIC PANEL
Anion gap: 13 (ref 5–15)
BUN: 22 mg/dL (ref 8–23)
CO2: 17 mmol/L — ABNORMAL LOW (ref 22–32)
Calcium: 7.5 mg/dL — ABNORMAL LOW (ref 8.9–10.3)
Chloride: 108 mmol/L (ref 98–111)
Creatinine, Ser: 1.26 mg/dL — ABNORMAL HIGH (ref 0.44–1.00)
GFR, Estimated: 43 mL/min — ABNORMAL LOW (ref >60)
Glucose, Bld: 66 mg/dL — ABNORMAL LOW (ref 70–99)
Potassium: 4 mmol/L (ref 3.5–5.1)
Sodium: 138 mmol/L (ref 135–145)

## 2021-05-20 LAB — GLUCOSE, CAPILLARY
Glucose-Capillary: 58 mg/dL — ABNORMAL LOW (ref 70–99)
Glucose-Capillary: 141 mg/dL — ABNORMAL HIGH (ref 70–99)
Glucose-Capillary: 102 mg/dL — ABNORMAL HIGH (ref 70–99)
Glucose-Capillary: 86 mg/dL (ref 70–99)
Glucose-Capillary: 81 mg/dL (ref 70–99)
Glucose-Capillary: 103 mg/dL — ABNORMAL HIGH (ref 70–99)
Glucose-Capillary: 92 mg/dL (ref 70–99)

## 2021-05-20 LAB — TROPONIN I (HIGH SENSITIVITY)
Troponin I (High Sensitivity): 1033 ng/L (ref <18)
Troponin I (High Sensitivity): 814 ng/L (ref <18)

## 2021-05-20 LAB — LIPID PANEL
Cholesterol: 181 mg/dL (ref 0–200)
HDL: 105 mg/dL (ref >40)
LDL Cholesterol: 35 mg/dL (ref 0–99)
Total CHOL/HDL Ratio: 1.7 RATIO
Triglycerides: 204 mg/dL — ABNORMAL HIGH (ref <150)
VLDL: 41 mg/dL — ABNORMAL HIGH (ref 0–40)

## 2021-05-20 LAB — MAGNESIUM: Magnesium: 2 mg/dL (ref 1.7–2.4)

## 2021-05-20 LAB — HEMOGLOBIN A1C
Hgb A1c MFr Bld: 5 % (ref 4.8–5.6)
Mean Plasma Glucose: 97 mg/dL

## 2021-05-20 LAB — CK: Total CK: 3616 U/L — ABNORMAL HIGH (ref 38–234)

## 2021-05-20 MED ORDER — DEXTROSE 50 % IV SOLN
INTRAVENOUS | Status: AC
  Administered 2021-05-20: 50 mL
  Filled 2021-05-20: qty 50

## 2021-05-20 MED ORDER — BACITRACIN ZINC 500 UNIT/GM EX OINT
TOPICAL_OINTMENT | Freq: Two times a day (BID) | CUTANEOUS | Status: DC
  Filled 2021-05-20 (×2): qty 28.35

## 2021-05-20 MED ORDER — DEXTROSE 50 % IV SOLN
1.0000 | Freq: Once | INTRAVENOUS | Status: AC
  Administered 2021-05-20: 50 mL via INTRAVENOUS

## 2021-05-20 MED ORDER — ENOXAPARIN SODIUM 30 MG/0.3ML IJ SOSY: 30.0000 mg | PREFILLED_SYRINGE | INTRAMUSCULAR | Status: DC

## 2021-05-20 NOTE — Progress Notes (Addendum)
Patient tolerated getting up to the [facility] with max assist x's 2. Patient able to maintain balance on [facility] while staff changed sheets on bed. Patient's PO intake is very poor. Patient wants to drink fluids but is not taking in food very well. Daughters are at bedside to encourage her to eat. BG has remained 83 and above during this shift. No other hypoglycemic episodes since this morning.

## 2021-05-20 NOTE — Progress Notes (Signed)
Family Medicine Teaching Service Daily Progress Note Intern Pager: [phone]  Patient name: [name] Medical record number: [id number] Date of birth: 10-07-1941 Age: 80 y.o. Gender: female  Primary Care Provider: Asencion Noble, MD Consultants: Cardiology, Wound care Code Status: Full  Pt Overview and Major Events to Date:  05/18/2021: Admitted   Assessment and Plan: [name] is a 80 year old female presenting after fall.  PMH is significant for anxiety, arthritis, alcohol use disorder, carcinoid bronchial adenoma of right lung, depression, GERD, kidney stones, liver disease, osteoporosis, subarachnoid hemorrhage, macrocytic anemia and thrombocytopenia.  Hypoglycemia Patient had an episode of hypoglycemia with blood glucose 66 at night.  Received 1 ampule of dextrose and LR infusion '@125'  ml/hr and and repeat 141.  --CBG q2 h --Monitor closley   Fall I Agitation Patient is alert, awake, oriented to time place and person but states not feeling well this morning due to ongoing event of hypoglycemia.  Palliative care met with family yesterday and they are working on goals of care.  Palliative care has concern for patient's medical decision making.  Patient vaccine when about her orientation, decreased short-term memory.  PT suggested SNF placement and DME order for rolling walker and 3 in 1 , ordered yesterday --Palliative care following, appreciate their assistance --PT/OT eval and treat --Delirium precautions --Fall precautions --CBC in am --BMP in am  Thrombocytopenia I Macrocytic Anemia I Alcohol use disorder Platelets 68>63 Stable, Hg 10.5, 11-stable. Last CIWA 4, received 2 mg of Ativan yesterday. --Hold Lovenox, monitor closely --CBC in a.m. --c/w CIWA protocol with Ativan as needed --Alcohol cessation counseling --If any focal neurological deficit, consider repeat CT head  Superficial lacerations, leg wounds Wound care consulted yesterday and they evaluated patient  this morning. --Appreciate wound care assistance --Monitor closely for any signs of infection  HTN SBP 138-150, DBP 90-96 --c/w Metoprolol 100 mg --Monitor closely  AKI on CKD III sCr 1.55>1.36>1.26 -- Monitor closely --BMP in a.m. --mIVF  Elevated troponin's I myocardial injury Patient had elevated troponins on admission.  Cardiology consulted and waiting for their further recommendations.  EKG yesterday showed normal sinus rhythm with prolonged QTC and T wave abnormality likely inferior and anterior lateral ischemia.  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  EKG this a.m. also shows T wave abnormality showing inferior and anterior lateral ischemia with prolonged QTC. --Waiting for cardiology recommendations --f/u troponin's  Rhabdomyolysis Patient presented with CK [phone] --Trend CK --Monitor closely --mIVF '@150'  ml/hr, increased from 100  Left Rib fracture  Patient complains of left chest pain with position change. --c/w Lidocaine patch PRN --Tylenol 650 q8h PRN  FEN/GI: Regular diet PPx: SCD's vs Ted Hose   Status is: Inpatient  Remains inpatient appropriate because:Ongoing active pain requiring inpatient pain management, Ongoing diagnostic testing needed not appropriate for outpatient work up, Unsafe d/c plan, and IV treatments appropriate due to intensity of illness or inability to take PO  Dispo: The patient is from: Home              Anticipated d/c is to:  Home vs SNF              Patient currently is not medically stable to d/c.   Difficult to place patient No        Subjective:  Patient had an episode of hypoglycemia 66 last night Evaluated at bedside this morning with daughter present in the room.  Complains of not feeling well, could not expand on  that but states overall she does not feel well.  Denies chest pain but complains of left rib  pain.  Objective: Temp:  [97.8 F (36.6 C)-99.2 F (37.3 C)] 99.2 F (37.3 C) (06/20 0610) Pulse Rate:  [75-101] 87 (06/20 0837) Resp:  [17-20] 17 (06/20 0610) BP: (138-150)/(90-96) 138/96 (06/20 0837) SpO2:  [68 %-99 %] 99 % (06/20 0842) Physical Exam: General: Frail elderly lady sitting in bed, in mild distress Cardiovascular: Regular rate and rhythm Respiratory: Clear to ausculation bilaterally  Abdomen: Soft, non-tender, non-distended, BS + Extremities: Able to move all extremities Skin: many ecchymotic areas in different stage of healing's.   Laboratory: Recent Labs  Lab 05/18/21 1000 05/19/21 0221 05/20/21 0029  WBC 15.4* 12.3* 12.2*  HGB 12.5 10.5* 11.0*  HCT 39.3 32.7* 34.0*  PLT 101* 68* 63*   Recent Labs  Lab 05/18/21 1000 05/19/21 0221 05/20/21 0029  NA 143 138 138  K 4.1 4.0 4.0  CL 110 102 108  CO2 15* 17* 17*  BUN '22 22 22  ' CREATININE 1.55* 1.36* 1.26*  CALCIUM 8.7* 8.1* 7.5*  PROT 6.4* 5.5*  --   BILITOT 1.6* 1.3*  --   ALKPHOS 85 61  --   ALT 43 46*  --   AST 116* 118*  --   GLUCOSE 162* 83 66*      Imaging/Diagnostic Tests: ECHOCARDIOGRAM COMPLETE  Result Date: 05/19/2021    ECHOCARDIOGRAM REPORT   Patient Name:   [name] Date of Exam: 05/19/2021 Medical Rec #:  [id number]        Height:       60.0 in Accession #:    [id number]       Weight:       99.2 lb Date of Birth:  August 02, 1941       BSA:          1.386 m Patient Age:    36 years         BP:           120/94 mmHg Patient Gender: F                HR:           88 bpm. Exam Location:  Inpatient Procedure: 2D Echo, Cardiac Doppler and Color Doppler Indications:    Elevated troponin  History:        Patient has no prior history of Echocardiogram examinations.  Sonographer:    Cammy Brochure Referring Phys: [place]  1. The entire mid ventricle to apex is akinetic. 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 . Left ventricular  ejection fraction, by estimation, is 35%. The left ventricle has moderately decreased function. The left ventricle demonstrates regional wall motion abnormalities (see scoring diagram/findings for description). Left ventricular diastolic parameters are indeterminate.  2. Right ventricular systolic function is normal. The right ventricular size is normal.  3. The mitral valve is normal in structure. Mild to moderate mitral valve regurgitation.  4. The aortic valve is tricuspid. Aortic valve regurgitation is mild. No aortic stenosis is present.  5. The inferior vena cava is normal in size with greater than 50% respiratory variability, suggesting right atrial pressure of 3 mmHg.  6. Cannot exclude small pfo with left to right shunt. FINDINGS  Left Ventricle: The entire mid ventricle to apex is akinetic. 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 Left ventricular ejection fraction, by estimation, is 35%. The left ventricle has moderately  decreased function. The left ventricle demonstrates regional wall motion abnormalities. The left ventricular internal cavity size was normal in size. There is no left ventricular hypertrophy. Left ventricular diastolic parameters are indeterminate. Normal left ventricular filling pressure. Right Ventricle: The right ventricular size is normal. No increase in right ventricular wall thickness. Right ventricular systolic function is normal. Left Atrium: Left atrial size was normal in size. Right Atrium: Right atrial size was normal in size. Pericardium: There is no evidence of pericardial effusion. Mitral Valve: The mitral valve is normal in structure. Mild to moderate mitral valve regurgitation. Tricuspid Valve: The tricuspid valve is normal in structure. Tricuspid valve regurgitation is mild . No evidence of tricuspid stenosis. Aortic Valve: The aortic valve is tricuspid. Aortic valve regurgitation is mild. No aortic stenosis is present. Aortic valve mean  gradient measures 2.0 mmHg. Aortic valve peak gradient measures 3.3 mmHg. Aortic valve area, by VTI measures 2.09 cm. Pulmonic Valve: The pulmonic valve was not well visualized. Pulmonic valve regurgitation is not visualized. No evidence of pulmonic stenosis. Aorta: The aortic root is normal in size and structure. Pulmonary Artery: Mild pulmonary HTN, PASP is 36 mmHg. Venous: The inferior vena cava is normal in size with greater than 50% respiratory variability, suggesting right atrial pressure of 3 mmHg. IAS/Shunts: Cannot exclude small pfo with left to right shunt.  LEFT VENTRICLE PLAX 2D LVIDd:         3.90 cm  Diastology LVIDs:         2.50 cm  LV e' medial:    5.98 cm/s LV PW:         0.90 cm  LV E/e' medial:  9.6 LV IVS:        1.00 cm  LV e' lateral:   5.87 cm/s LVOT diam:     2.00 cm  LV E/e' lateral: 9.8 LV SV:         36 LV SV Index:   26 LVOT Area:     3.14 cm  RIGHT VENTRICLE            IVC RV Basal diam:  2.90 cm    IVC diam: 1.40 cm RV S prime:     9.36 cm/s TAPSE (M-mode): 1.9 cm LEFT ATRIUM             Index       RIGHT ATRIUM           Index LA diam:        2.60 cm 1.88 cm/m  RA Area:     10.76 cm LA Vol (A2C):   33.9 ml 24.47 ml/m RA Volume:   24.25 ml  17.50 ml/m LA Vol (A4C):   35.2 ml 25.40 ml/m LA Biplane Vol: 34.5 ml 24.90 ml/m  AORTIC VALVE AV Area (Vmax):    2.31 cm AV Area (Vmean):   2.14 cm AV Area (VTI):     2.09 cm AV Vmax:           91.20 cm/s AV Vmean:          60.800 cm/s AV VTI:            0.173 m AV Peak Grad:      3.3 mmHg AV Mean Grad:      2.0 mmHg LVOT Vmax:         67.10 cm/s LVOT Vmean:        41.400 cm/s LVOT VTI:          0.115 m LVOT/AV  VTI ratio: 0.66  AORTA Ao Root diam: 2.70 cm Ao Asc diam:  2.70 cm MITRAL VALVE               TRICUSPID VALVE MV Area (PHT): 4.89 cm    TR Peak grad:   32.5 mmHg MV Decel Time: 155 msec    TR Vmax:        285.00 cm/s MV E velocity: 57.50 cm/s MV A velocity: 75.80 cm/s  SHUNTS MV E/A ratio:  0.76        Systemic VTI:  0.12 m                             Systemic Diam: 2.00 cm Carlyle Dolly MD Electronically signed by Carlyle Dolly MD Signature Date/Time: 05/19/2021/2:41:19 PM    Final      Yetunde Leis, Meredith Staggers, MD 05/20/2021, 12:41 PM PGY-1, [facility] Intern pager: [phone], text pages welcome

## 2021-05-20 NOTE — Evaluation (Signed)
Occupational Therapy Evaluation Patient Details Name: [name] MRN: [id number] DOB: 09/06/1941 Today's Date: 05/20/2021    History of Present Illness [name] is a 80 y.o. 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   Clinical Impression   Pt presents with decline in function and safety with ADLs and ADL mobility with impaired strength, balance, endurance and cognition; pt also limited by pain. Pt confused, agitated and keeping eyes closed most of session. PTA pt lived at home alone, was Ind with sponge bathing, dressing, toileting; pt has PCA available to her 7 days/wk, however refuses to let them into her home per daughters' report. Pt currently requires total A to sit EOB, mod A with simple grooming, total A with UB and LB selfcare ad required max A to maintain sitting balance EOB. Pt unsafe to attempt sit - stand/transfers this session. Pt returned to supine and positioned to [facility] total A with use of bed controls. Both of pt's daughters present. Pt would benefit from acute OT services to address impairments to maximize level of function and safety    Follow Up Recommendations  SNF;Supervision/Assistance - 24 hour    Equipment Recommendations  Other (comment) (TBD at SNF)    Recommendations for Other Services       Precautions / Restrictions Precautions Precautions: Fall Restrictions Weight Bearing Restrictions: No      Mobility Bed Mobility Overal bed mobility: Needs Assistance Bed Mobility: Supine to Sit;Sit to Supine     Supine to sit: Total assist Sit to supine: Total assist   General bed mobility comments: Required assist to being LEs to EOB, to elevate  trunk and to maintain sitting balance. Pt with posterior lean/resistance    Transfers                 General transfer comment: unable to safely attempt this session    Balance Overall balance assessment: Needs assistance Sitting-balance support: Feet supported;Bilateral upper extremity supported Sitting balance-Leahy Scale: Poor                                     ADL either performed or assessed with clinical judgement   ADL Overall ADL's : Needs assistance/impaired Eating/Feeding: Minimal assistance;Sitting;Bed level   Grooming: Wash/dry face;Wash/dry hands;Bed level;Moderate assistance Grooming Details (indicate cue type and reason): hand over hand assist Upper Body Bathing: Total assistance   Lower Body Bathing: Total assistance   Upper Body Dressing : Total assistance   Lower Body Dressing: Total assistance       Toileting- Clothing Manipulation and Hygiene: Total assistance;Bed level         General ADL Comments: pt required max A for sitting balance at EOB, unable to safely attempt sit - stand/transfers this session     Vision Patient Visual Report: No change from baseline       Perception     Praxis      Pertinent Vitals/Pain Pain Assessment: Faces Faces Pain Scale: Hurts even more Pain Location: specifically L lower ribs; otherwise also generalized pain with movement Pain Descriptors / Indicators: Aching;Grimacing;Guarding Pain Intervention(s): Premedicated before session;Monitored during session;Limited activity within patient's tolerance;Repositioned     Hand Dominance Right  Extremity/Trunk Assessment Upper Extremity Assessment Upper Extremity Assessment: Generalized weakness   Lower Extremity Assessment Lower Extremity Assessment: Defer to PT evaluation   Cervical / Trunk Assessment Cervical / Trunk Assessment: Kyphotic   Communication Communication Communication: No difficulties   Cognition Arousal/Alertness:  Awake/alert Behavior During Therapy: WFL for tasks assessed/performed;Restless Overall Cognitive Status: Impaired/Different from baseline Area of Impairment: Orientation;Attention;Memory;Safety/judgement;Awareness                 Orientation Level: Disoriented to;Place;Time;Situation   Memory: Decreased short-term memory   Safety/Judgement: Decreased awareness of safety;Decreased awareness of deficits     General Comments: Pt keeping eyes closed most of the time, repeating same questions that had been answered   General Comments       Exercises     Shoulder Instructions      Home Living Family/patient expects to be discharged to:: Private residence Living Arrangements: Alone Available Help at Discharge: Family;Friend(s);Other (Comment) Type of Home: House Home Access: Stairs to enter [employer] of Steps: 6 Entrance Stairs-Rails: Right;Left Home Layout: Multi-level;Able to live on main level with bedroom/bathroom Alternate Level Stairs-Number of Steps: flight   Bathroom Shower/Tub: Tub/shower unit;Walk-in shower   Bathroom Toilet: Standard     Home Equipment: Environmental consultant - 2 wheels   Additional Comments: Pt's daughters report that pt has aide assist available x 7 days/wk, however she refuses to allow them into her home so they check on her from the front door      Prior Functioning/Environment Level of Independence: Independent with assistive device(s)  Gait / Transfers Assistance Needed: has RW, but only uses it sometimes, reaches for furniture and walls ADL's / Homemaking Assistance Needed: bathe with wipes, does not get into shower, dresses and toilets herself   Comments: Tells me she is managing independently, uses RW prn; a friend drives her for shopping, etc        OT Problem List: Decreased strength;Impaired balance (sitting and/or standing);Decreased cognition;Pain;Decreased safety awareness;Decreased range of motion;Decreased activity  tolerance;Decreased coordination;Decreased knowledge of use of DME or AE      OT Treatment/Interventions: Self-care/ADL training;DME and/or AE instruction;Therapeutic activities;Balance training;Therapeutic exercise;Patient/family education    OT Goals(Current goals can be found in the care plan section) Acute Rehab OT Goals Patient Stated Goal: to get home OT Goal Formulation: With patient/family Time For Goal Achievement: 06/03/21 Potential to Achieve Goals: Good ADL Goals Pt Will Perform Eating: with set-up;with supervision;sitting Pt Will Perform Grooming: with min assist;sitting;with min guard assist Pt Will Perform Upper Body Bathing: with max assist;with mod assist;sitting Pt Will Perform Lower Body Bathing: with max assist;with mod assist;sitting/lateral leans Pt Will Perform Upper Body Dressing: with max assist;with mod assist;sitting Pt Will Transfer to Toilet: with max assist;with mod assist;stand pivot transfer;bedside commode Pt Will Perform Toileting - Clothing Manipulation and hygiene: with max assist;with mod assist;sit to/from stand  OT Frequency: Min 2X/week   Barriers to D/C:            Co-evaluation              AM-PAC OT "6 Clicks" Daily Activity     Outcome Measure Help from another person eating meals?: A Little Help from another person taking care of personal grooming?: A Lot Help from another person toileting, which includes using toliet, bedpan, or urinal?: Total Help from another person bathing (including washing, rinsing, drying)?: Total Help from another person to put on and taking off regular upper body clothing?: Total Help from another person to put on and  taking off regular lower body clothing?: Total 6 Click Score: 9   End of Session    Activity Tolerance: Patient limited by fatigue;Patient limited by pain Patient left: in bed;with call bell/phone within reach;with family/visitor present  OT Visit Diagnosis: Other abnormalities of gait  and mobility (R26.89);History of falling (Z91.81);Muscle weakness (generalized) (M62.81);Pain;Other symptoms and signs involving cognitive function Pain - Right/Left: Left Pain - part of body:  (L rib areas, generalized)                Time: 3419-6222 OT Time Calculation (min): 20 min Charges:  OT General Charges $OT Visit: 1 Visit OT Evaluation $OT Eval Moderate Complexity: 1 Mod    Britt Bottom 05/20/2021, 2:23 PM

## 2021-05-20 NOTE — Consult Note (Signed)
[facility] Nurse Consult Note: Patient receiving care in [place]. Reason for Consult: BLE wounds, abrasions, lacerations Wound type: trauma injuries Pressure Injury POA: Yes/No/NA Measurement: Wound bed: see photos of BLE Drainage (amount, consistency, odor)  Periwound: Dressing procedure/placement/frequency: Apply bacitracin to all open wounds, lacerations, abrasions to BLE. Cover with vaseline gauze, secure with kerlex.  Monitor the wound area(s) for worsening of condition such as: Signs/symptoms of infection,  Increase in size,  Development of or worsening of odor, Development of pain, or increased pain at the affected locations.  Notify the medical team if any of these develop.  [facility] nurse will not follow at this time.  Please re-consult the [facility] team if needed.  Val Riles, RN, MSN, CWOCN, CNS-BC, pager [phone]

## 2021-05-20 NOTE — Progress Notes (Signed)
Patient ID: [name], female   DOB: 1941/04/10, 80 y.o.   MRN: [id number]    Progress Note from the Palliative Medicine Team at [facility]   Patient Name: [name]        Date: 05/20/2021 DOB: [birth year]  Age: 80 y.o. MRN#: [id number] Attending Physician: McDiarmid, Blane Ohara, MD Primary Care Physician: Asencion Noble, MD Admit Date: 05/18/2021   Medical records reviewed   80 y.o. female   admitted on 05/18/2021 with h/o fall.  Last spoke with daughter on phone at 3 PM.  Daughter found early this morning on floor.  Patient does not remember what caused fall.   Action tremor in both legs and arms on exam.  Ataxia with finger to nose.  No Asterixis.  Has bruising on around eyes bilaterally and left forehead, left shoulder, both arms and legs.  Patient's duaghter indicates some bruises on arms and legs are old.   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       Long history of alcohol use disorder and falls.  Gait has been declining over past year.   Family very concerned with safety in the home, and patient mental health status and insight.   Patient and family face treatment option decisions, advanced directive decisions and anticipatory care needs    This NP visited patient at the bedside as a follow up to  yesterday's GOCs meeting.  Two daughter at bedside.  Patient is alert, and oriented to person and place.  Poor insight into her understanding of her current medical situation.   Patient easily agitates and raises her voice when discussion around transition of care needs are  addressed.  Not stable for discharge at this time.  Family hopeful for Psychiatry to weigh in on case.  They have been consulted  Discussed with patient the importance of continued conversation with  her family  and their  medical providers regarding overall plan of care and treatment options,  ensuring decisions are within the context of the patients values and GOCs.  Questions and concerns addressed    Total time spent on the unit was 25 minutes  Greater than 50% of the time was spent in counseling and coordination of care  Wadie Lessen NP  Palliative Medicine Team Team Phone # [phone] Pager [phone]

## 2021-05-20 NOTE — Progress Notes (Addendum)
Progress Note  Patient Name: [name] Date of Encounter: 05/20/2021  Primary Cardiologist: New to [facility]   Subjective   Denies chest pain of SOB. Very sore from fall.   Inpatient Medications    Scheduled Meds:  bacitracin   Topical BID   calcium-vitamin D  1 tablet Oral Q breakfast   cycloSPORINE  1 drop Both Eyes BID   escitalopram  10 mg Oral q AM   feeding supplement  237 mL Oral BID BM   folic acid  1 mg Oral Daily   metoprolol succinate  100 mg Oral q AM   multivitamin with minerals  1 tablet Oral Daily   thiamine  100 mg Oral q AM   vitamin B-12  1,000 mcg Oral Daily   Continuous Infusions:  lactated ringers 125 mL/hr at 05/20/21 0859   PRN Meds: acetaminophen, lidocaine, LORazepam **OR** LORazepam   Vital Signs    Vitals:   05/19/21 2012 05/20/21 0610 05/20/21 0837 05/20/21 0842  BP: (!) 150/94 (!) 144/96 (!) 138/96   Pulse: 75 (!) 101 87   Resp: 17 17    Temp: 97.8 F (36.6 C) 99.2 F (37.3 C)    TempSrc:  Oral    SpO2: 94% (!) 68% (!) 75% 99%  Weight:      Height:        Intake/Output Summary (Last 24 hours) at 05/20/2021 1052 Last data filed at 05/20/2021 7989 Gross per 24 hour  Intake 1050 ml  Output 0 ml  Net 1050 ml   Filed Weights   05/18/21 1003  Weight: 45 kg    Physical Exam   General: Frail, elderly, NAD Skin: Many ecchymotic areas to face, chest, bilateral arms and legs.  Neck: Negative for carotid bruits. No JVD Lungs:Clear to ausculation bilaterally. Breathing is unlabored. Cardiovascular: RRR with S1 S2. + murmur Extremities: No edema. Neuro: Alert and oriented. No focal deficits. No facial asymmetry. MAE spontaneously. Psych: Responds to questions appropriately with normal affect.    Labs    Chemistry Recent Labs  Lab 05/18/21 1000 05/19/21 0221 05/20/21 0029  NA 143 138 138  K 4.1 4.0 4.0  CL 110 102 108  CO2 15* 17* 17*  GLUCOSE 162* 83 66*  BUN 22 22 22   CREATININE 1.55* 1.36* 1.26*  CALCIUM 8.7*  8.1* 7.5*  PROT 6.4* 5.5*  --   ALBUMIN 3.9 3.3*  --   AST 116* 118*  --   ALT 43 46*  --   ALKPHOS 85 61  --   BILITOT 1.6* 1.3*  --   GFRNONAA 34* 40* 43*  ANIONGAP 18* 19* 13     Hematology Recent Labs  Lab 05/18/21 1000 05/19/21 0221 05/20/21 0029  WBC 15.4* 12.3* 12.2*  RBC 3.88 3.24* 3.39*  HGB 12.5 10.5* 11.0*  HCT 39.3 32.7* 34.0*  MCV 101.3* 100.9* 100.3*  MCH 32.2 32.4 32.4  MCHC 31.8 32.1 32.4  RDW 15.1 15.2 15.1  PLT 101* 68* 63*    Cardiac EnzymesNo results for input(s): TROPONINI in the last 168 hours. No results for input(s): TROPIPOC in the last 168 hours.   BNPNo results for input(s): BNP, PROBNP in the last 168 hours.   DDimer No results for input(s): DDIMER in the last 168 hours.   Radiology    CT Head Wo Contrast  Result Date: 05/18/2021 CLINICAL DATA:  Patient status post fall. EXAM: CT HEAD WITHOUT CONTRAST CT CERVICAL SPINE WITHOUT CONTRAST TECHNIQUE: Multidetector  CT imaging of the head and cervical spine was performed following the standard protocol without intravenous contrast. Multiplanar CT image reconstructions of the cervical spine were also generated. COMPARISON:  Brain CT 09/03/2020. FINDINGS: CT HEAD FINDINGS Brain: Ventricles and sulci are appropriate for patient's age. No evidence for acute cortically based infarct, intracranial hemorrhage, mass lesion or mass-effect. Vascular: Unremarkable. Skull: No aggressive or acute appearing osseous lesions. Sinuses/Orbits: Paranasal sinuses are well aerated. Mastoid air cells are unremarkable. Orbits are unremarkable. Other: None. CT CERVICAL SPINE FINDINGS Alignment: Normal anatomic alignment. Skull base and vertebrae: Intact. Soft tissues and spinal canal: No prevertebral fluid or swelling. No visible canal hematoma. Disc levels: Multilevel degenerative disc disease most pronounced C5-6. No acute fracture. Upper chest: Postsurgical changes right lung apex. Other: None. IMPRESSION: No acute cervical  spine fracture. No acute intracranial process. Electronically Signed   By: Lovey Newcomer M.D.   On: 05/18/2021 11:59   CT Cervical Spine Wo Contrast  Result Date: 05/18/2021 CLINICAL DATA:  Patient status post fall. EXAM: CT HEAD WITHOUT CONTRAST CT CERVICAL SPINE WITHOUT CONTRAST TECHNIQUE: Multidetector CT imaging of the head and cervical spine was performed following the standard protocol without intravenous contrast. Multiplanar CT image reconstructions of the cervical spine were also generated. COMPARISON:  Brain CT 09/03/2020. FINDINGS: CT HEAD FINDINGS Brain: Ventricles and sulci are appropriate for patient's age. No evidence for acute cortically based infarct, intracranial hemorrhage, mass lesion or mass-effect. Vascular: Unremarkable. Skull: No aggressive or acute appearing osseous lesions. Sinuses/Orbits: Paranasal sinuses are well aerated. Mastoid air cells are unremarkable. Orbits are unremarkable. Other: None. CT CERVICAL SPINE FINDINGS Alignment: Normal anatomic alignment. Skull base and vertebrae: Intact. Soft tissues and spinal canal: No prevertebral fluid or swelling. No visible canal hematoma. Disc levels: Multilevel degenerative disc disease most pronounced C5-6. No acute fracture. Upper chest: Postsurgical changes right lung apex. Other: None. IMPRESSION: No acute cervical spine fracture. No acute intracranial process. Electronically Signed   By: Lovey Newcomer M.D.   On: 05/18/2021 11:59   CT CHEST ABDOMEN PELVIS W CONTRAST  Result Date: 05/18/2021 CLINICAL DATA:  Abdominal trauma. EXAM: CT CHEST, ABDOMEN, AND PELVIS WITH CONTRAST TECHNIQUE: Multidetector CT imaging of the chest, abdomen and pelvis was performed following the standard protocol during bolus administration of intravenous contrast. CONTRAST:  165mL OMNIPAQUE IOHEXOL 300 MG/ML  SOLN COMPARISON:  February 29, 2020 FINDINGS: CT CHEST FINDINGS Cardiovascular: Mildly enlarged heart. Calcific atherosclerotic disease of the coronary  arteries and aorta. No central pulmonary embolus. Mediastinum/Nodes: No enlarged mediastinal, hilar, or axillary lymph nodes. Thyroid gland, trachea, and esophagus demonstrate no significant findings. Lungs/Pleura: Lungs are clear. No pleural effusion or pneumothorax. Musculoskeletal: No chest wall mass or suspicious bone lesions identified. CT ABDOMEN PELVIS FINDINGS Hepatobiliary: No hepatic injury or perihepatic hematoma. Gallbladder is unremarkable. Hepatic steatosis. Pancreas: Unremarkable. No pancreatic ductal dilatation or surrounding inflammatory changes. Spleen: No splenic injury or perisplenic hematoma. Adrenals/Urinary Tract: Normal adrenal glands. Bilateral nonobstructive renal calculi measuring up to 7 mm. 2.7 cm left renal cyst. Normal urinary bladder. Stomach/Bowel: Stomach is within normal limits. No evidence of bowel wall thickening, distention, or inflammatory changes. Vascular/Lymphatic: Aortic atherosclerosis and tortuosity. No enlarged abdominal or pelvic lymph nodes. Reproductive: Status post hysterectomy. No adnexal masses. Other: Fat containing periumbilical anterior abdominal wall hernia. Second fat and bowel containing infraumbilical anterior abdominal wall hernia. No CT evidence of bowel incarceration. Postsurgical changes in the abdomen. Musculoskeletal: No fracture is seen. Scoliosis and spondylosis of the spine. IMPRESSION: 1. No evidence of  acute traumatic injury to the chest, abdomen or pelvis. 2. Hepatic steatosis. 3. Bilateral nonobstructive renal calculi. 4. Fat containing periumbilical anterior abdominal wall hernia. 5. Second fat and bowel containing infraumbilical anterior abdominal wall hernia. No CT evidence of bowel incarceration. 6. Aortic atherosclerosis. Aortic Atherosclerosis (ICD10-I70.0). Electronically Signed   By: Fidela Salisbury M.D.   On: 05/18/2021 12:25   ECHOCARDIOGRAM COMPLETE  Result Date: 05/19/2021    ECHOCARDIOGRAM REPORT   Patient Name:   GWENDLYN HANBACK Date of Exam: 05/19/2021 Medical Rec #:  [id number]        Height:       60.0 in Accession #:    [id number]       Weight:       99.2 lb Date of Birth:  May 30, 1941       BSA:          1.386 m Patient Age:    80 years         BP:           120/94 mmHg Patient Gender: F                HR:           88 bpm. Exam Location:  Inpatient Procedure: 2D Echo, Cardiac Doppler and Color Doppler Indications:    Elevated troponin  History:        Patient has no prior history of Echocardiogram examinations.  Sonographer:    Cammy Brochure Referring Phys: [place]  1. The entire mid ventricle to apex is akinetic. 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 . Left ventricular ejection fraction, by estimation, is 35%. The left ventricle has moderately decreased function. The left ventricle demonstrates regional wall motion abnormalities (see scoring diagram/findings for description). Left ventricular diastolic parameters are indeterminate.  2. Right ventricular systolic function is normal. The right ventricular size is normal.  3. The mitral valve is normal in structure. Mild to moderate mitral valve regurgitation.  4. The aortic valve is tricuspid. Aortic valve regurgitation is mild. No aortic stenosis is present.  5. The inferior vena cava is normal in size with greater than 50% respiratory variability, suggesting right atrial pressure of 3 mmHg.  6. Cannot exclude small pfo with left to right shunt. FINDINGS  Left Ventricle: The entire mid ventricle to apex is akinetic. 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 Left ventricular ejection fraction, by estimation, is 35%. The left ventricle has moderately decreased function. The left ventricle demonstrates regional wall motion abnormalities. The left ventricular internal cavity size was normal in size. There is no left ventricular hypertrophy. Left ventricular diastolic parameters are  indeterminate. Normal left ventricular filling pressure. Right Ventricle: The right ventricular size is normal. No increase in right ventricular wall thickness. Right ventricular systolic function is normal. Left Atrium: Left atrial size was normal in size. Right Atrium: Right atrial size was normal in size. Pericardium: There is no evidence of pericardial effusion. Mitral Valve: The mitral valve is normal in structure. Mild to moderate mitral valve regurgitation. Tricuspid Valve: The tricuspid valve is normal in structure. Tricuspid valve regurgitation is mild . No evidence of tricuspid stenosis. Aortic Valve: The aortic valve is tricuspid. Aortic valve regurgitation is mild. No aortic stenosis is present. Aortic valve mean gradient measures 2.0 mmHg. Aortic valve peak gradient measures 3.3 mmHg. Aortic valve area, by VTI measures 2.09 cm. Pulmonic Valve: The pulmonic valve was not well visualized. Pulmonic valve regurgitation  is not visualized. No evidence of pulmonic stenosis. Aorta: The aortic root is normal in size and structure. Pulmonary Artery: Mild pulmonary HTN, PASP is 36 mmHg. Venous: The inferior vena cava is normal in size with greater than 50% respiratory variability, suggesting right atrial pressure of 3 mmHg. IAS/Shunts: Cannot exclude small pfo with left to right shunt.  LEFT VENTRICLE PLAX 2D LVIDd:         3.90 cm  Diastology LVIDs:         2.50 cm  LV e' medial:    5.98 cm/s LV PW:         0.90 cm  LV E/e' medial:  9.6 LV IVS:        1.00 cm  LV e' lateral:   5.87 cm/s LVOT diam:     2.00 cm  LV E/e' lateral: 9.8 LV SV:         36 LV SV Index:   26 LVOT Area:     3.14 cm  RIGHT VENTRICLE            IVC RV Basal diam:  2.90 cm    IVC diam: 1.40 cm RV S prime:     9.36 cm/s TAPSE (M-mode): 1.9 cm LEFT ATRIUM             Index       RIGHT ATRIUM           Index LA diam:        2.60 cm 1.88 cm/m  RA Area:     10.76 cm LA Vol (A2C):   33.9 ml 24.47 ml/m RA Volume:   24.25 ml  17.50 ml/m LA Vol  (A4C):   35.2 ml 25.40 ml/m LA Biplane Vol: 34.5 ml 24.90 ml/m  AORTIC VALVE AV Area (Vmax):    2.31 cm AV Area (Vmean):   2.14 cm AV Area (VTI):     2.09 cm AV Vmax:           91.20 cm/s AV Vmean:          60.800 cm/s AV VTI:            0.173 m AV Peak Grad:      3.3 mmHg AV Mean Grad:      2.0 mmHg LVOT Vmax:         67.10 cm/s LVOT Vmean:        41.400 cm/s LVOT VTI:          0.115 m LVOT/AV VTI ratio: 0.66  AORTA Ao Root diam: 2.70 cm Ao Asc diam:  2.70 cm MITRAL VALVE               TRICUSPID VALVE MV Area (PHT): 4.89 cm    TR Peak grad:   32.5 mmHg MV Decel Time: 155 msec    TR Vmax:        285.00 cm/s MV E velocity: 57.50 cm/s MV A velocity: 75.80 cm/s  SHUNTS MV E/A ratio:  0.76        Systemic VTI:  0.12 m                            Systemic Diam: 2.00 cm Angela Dolly MD Electronically signed by Angela Dolly MD Signature Date/Time: 05/19/2021/2:41:19 PM    Final     Telemetry    05/20/21 NSR - Personally Reviewed  ECG    No new tracing as of 05/20/21 -  Personally Reviewed  Cardiac Studies   Echocardiogram 05/19/21:   1. The entire mid ventricle to apex is akinetic. 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 . Left ventricular  ejection fraction, by estimation, is 35%. The left ventricle has  moderately decreased function. The left  ventricle demonstrates regional wall motion abnormalities (see scoring  diagram/findings for description). Left ventricular diastolic parameters  are indeterminate.   2. Right ventricular systolic function is normal. The right ventricular  size is normal.   3. The mitral valve is normal in structure. Mild to moderate mitral valve  regurgitation.   4. The aortic valve is tricuspid. Aortic valve regurgitation is mild. No  aortic stenosis is present.   5. The inferior vena cava is normal in size with greater than 50%  respiratory variability, suggesting right atrial pressure of 3 mmHg.   6. Cannot exclude small pfo  with left to right shunt.   Patient Profile     80 y.o. 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 Cardiology is consulted for elevated troponin.   Assessment & Plan    1. New cardiomyopathy: -Pt presented to [facility] after being found down for at least 12 hours by her daughter in the setting of alcohol disorder.  -Cardiology consulted due to elevated HsT>> [phone] however this was felt to be elevated due to rhabdomyolysis. She had no symptoms concerning for ACS which would have prompted acute ischemic evaluation. -Unfortunately echocardiogram performed yesterday showed an LVEF at 35% with akinetic mid ventricle to apex>>pattern suggestive of Takostubo.  -Will likely need further workup with CCTA versus LHC to definitively rule out ischemic etiology>>>need to be mindful of timing with renal dysfunction.  -Unable to start GDMT at this time due to CKD>>watch and trend  -Continue Toprol 100 -Risk stratify HbA1c -LDL 35 from 05/20/21  2. 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 CKD stage III: -Unclear baseline>>>creatinine this admission at 1.55>>1.36>>1.26 -Trend closely for timing of GDMT for CM   4. ETOH disorder: -Daughter bedside who report years of alcohol abuse. Pt lives alone and was found face down after at least 12 hours. Given this, even if coronary etiology, she is not a candidate for DAPT in her current situation. This was communicated with her daughters who agree    Signed, Kathyrn Drown NP-C [place] Pager: [phone] 05/20/2021, 10:52 AM     For questions or updates, please contact   Please consult [email or web address] for contact info under Cardiology/STEMI.  Patient seen and examined with Kathyrn Drown, NP-C.  Agree as above, with the following exceptions and changes as noted below.  Patient is sleeping but awakens easily to voice.  Speech is slow but she asked appropriate questions.  Her daughter is  at the bedside and we primarily discussed her care.  Gen: NAD, CV: RRR, no murmurs, Lungs: clear, Abd: soft, Extrem: Warm, well perfused, trace edema, Neuro/Psych: Unable to fully assess given patient's extreme drowsiness.  All available labs, radiology testing, previous records reviewed.  She is now noted to have reduced ejection fraction which could have multiple etiologies.  She had a prolonged downtime which could result in stress cardiomyopathy, however she may need an ischemic evaluation when she recovers further.  Her daughter brings up a good point that this could be alcohol related cardiomyopathy as well given her initial presentation.  I answered questions to the best my ability regarding future testing  which may depend on renal function and patient's goals of care.  If we are able to optimize heart rate we could consider coronary CTA for ischemic evaluation, otherwise inpatient nuclear study could also be considered if necessary to complete ischemic work-up inpatient.  However this could be deferred to the outpatient for better imaging capabilities on a nuclear camera with positional imaging.  Her daughter works for her cardiology office in [place] and has a good understanding of options for ischemic testing.  Elouise Munroe, MD 05/20/21 2:36 PM  Total time of encounter: 35 minutes total time of encounter, including 20 minutes spent in face-to-face patient care on the date of this encounter. This time includes coordination of care and counseling regarding above mentioned problem list. Remainder of non-face-to-face time involved reviewing chart documents/testing relevant to the patient encounter and documentation in the medical record. I have independently reviewed documentation from referring provider.   Cherlynn Kaiser, MD, [place]

## 2021-05-20 NOTE — Progress Notes (Cosign Needed)
       RE:  [name]    Date of Birth:  05-17-1941  Date:    05/20/2021     To Whom It May Concern:  Please be advised that the above-named patient will require a short-term nursing home stay - anticipated 30 days or less for rehabilitation and strengthening.  The plan is for return home.

## 2021-05-20 NOTE — NC FL2 (Signed)
[place] LEVEL OF CARE SCREENING TOOL     IDENTIFICATION  Patient Name: [name] Birthdate: [birth year] Sex: female Admission Date (Current Location): 05/18/2021  [facility] [place] and [place] Number:  Herbalist and Address:  [facility] [place], [place] [address] [place], [place] [address]      Provider Number: [id number]  Attending Physician Name and Address:  McDiarmid, Blane Ohara, MD  Relative Name and Phone Number:  Marlyn Corporal "call first" (Daughter)   [phone] (Mobile)    Current Level of Care: Hospital Recommended Level of Care: [place] Prior Approval Number:    Date Approved/Denied:   PASRR Number: Pending  Discharge Plan: SNF    Current Diagnoses: Patient Active Problem List   Diagnosis Date Noted   Generalized weakness 05/19/2021   Rhabdomyolysis 91/50/5697   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[place]) 05/19/2021   Atherosclerosis of aorta ([place]) 05/19/2021   Multiple skin tears 05/19/2021   Multiple Bruising sites 05/19/2021   Poor historian 05/19/2021   Acute renal injury ([place]) 05/19/2021   Transaminitis 05/19/2021   Bilirubinemia 05/19/2021   Hypoproteinemia ([place]) 05/19/2021   Elevated troponin 05/19/2021   Vitamin B12 deficiency 05/19/2021   Macrocytic anemia 05/19/2021   Thrombocytopenia ([place]) 05/19/2021   Leukocytosis 05/19/2021   TSH elevation 05/19/2021   Pyuria 05/19/2021   Hematuria 05/19/2021   Dehydration 05/19/2021   Cerebral atrophy ([place]) 05/19/2021   Degenerative disc disease, cervical 05/19/2021   Bilateral Periorbital ecchymosis 05/19/2021   Myocardial injury    Elevated CK    Fall 05/18/2021   Multiple closed fractures of ribs of left side 05/18/2021    Orientation RESPIRATION BLADDER Height & Weight     Self, Place  O2 (2L [place]) Continent Weight: 99 lb 3.3 oz (45 kg) Height:  5' (152.4 cm)  BEHAVIORAL SYMPTOMS/MOOD NEUROLOGICAL BOWEL NUTRITION STATUS      Continent Diet (see d/c  summary)  AMBULATORY STATUS COMMUNICATION OF NEEDS Skin   Extensive Assist Verbally Surgical wounds                       Personal Care Assistance Level of Assistance  Bathing, Feeding, Dressing Bathing Assistance: Maximum assistance Feeding assistance: Independent Dressing Assistance: Limited assistance     Functional Limitations Info  Sight, Hearing, Speech Sight Info: Adequate Hearing Info: Adequate Speech Info: Adequate    SPECIAL CARE FACTORS FREQUENCY  PT (By licensed PT), OT (By licensed OT)     PT Frequency: 5x/week OT Frequency: 5x/week            Contractures Contractures Info: Not present    Additional Factors Info  Code Status, Allergies Code Status Info: Full code Allergies Info: Clindamycin/lincomycin, Doxycycline           Current Medications (05/20/2021):  This is the current hospital active medication list Current Facility-Administered Medications  Medication Dose Route Frequency Provider Last Rate Last Admin   acetaminophen (TYLENOL) tablet 650 mg  650 mg Oral Q8H PRN Dagar, Meredith Staggers, MD       bacitracin ointment   Topical BID McDiarmid, Blane Ohara, MD       calcium-vitamin D (OSCAL WITH D) 500-200 MG-UNIT per tablet 1 tablet  1 tablet Oral Q breakfast Maness, Philip, MD   1 tablet at 05/20/21 0740   cycloSPORINE (RESTASIS) 0.05 % ophthalmic emulsion 1 drop  1 drop Both Eyes BID Delora Fuel, MD   1 drop at 05/20/21 0832   escitalopram (LEXAPRO) tablet  10 mg  10 mg Oral q AM Maness, Philip, MD   10 mg at 05/20/21 2563   feeding supplement (ENSURE ENLIVE / ENSURE PLUS) liquid 237 mL  237 mL Oral BID BM McDiarmid, Blane Ohara, MD   237 mL at 89/37/34 2876   folic acid (FOLVITE) tablet 1 mg  1 mg Oral Daily Maness, Philip, MD   1 mg at 05/20/21 8115   lactated ringers infusion   Intravenous Continuous Long, Wonda Olds, MD 125 mL/hr at 05/20/21 0859 New Bag at 05/20/21 0859   lidocaine (LIDODERM) 5 % 1 patch  1 patch Transdermal BID PRN Dagar, Meredith Staggers, MD   1  patch at 05/20/21 1140   LORazepam (ATIVAN) tablet 1-4 mg  1-4 mg Oral Q1H PRN Dagar, Meredith Staggers, MD   2 mg at 05/19/21 1859   Or   LORazepam (ATIVAN) injection 1-4 mg  1-4 mg Intravenous Q1H PRN Dagar, Meredith Staggers, MD       metoprolol succinate (TOPROL-XL) 24 hr tablet 100 mg  100 mg Oral q AM Autry-Lott, Simone, DO   100 mg at 05/20/21 7262   multivitamin with minerals tablet 1 tablet  1 tablet Oral Daily Autry-Lott, Simone, DO   1 tablet at 05/20/21 0355   thiamine tablet 100 mg  100 mg Oral q AM Maness, Philip, MD   100 mg at 05/20/21 9741   vitamin B-12 (CYANOCOBALAMIN) tablet 1,000 mcg  1,000 mcg Oral Daily McDiarmid, Blane Ohara, MD   1,000 mcg at 05/20/21 6384     Discharge Medications: Please see discharge summary for a list of discharge medications.  Relevant Imaging Results:  Relevant Lab Results:   Additional Information SSN [id number] Pt covid vaccinated x3  Murat Rideout Nicole Kindred, LCSW

## 2021-05-20 NOTE — TOC Initial Note (Signed)
Transition of Care [facility]) - Initial/Assessment Note    Patient Details  Name: [name] MRN: [id number] Date of Birth: December 26, 1940  Transition of Care [facility]) CM/SW Contact:    Bethann Berkshire, [place] Phone Number: 05/20/2021, 11:35 AM  Clinical Narrative:                  Per chart review; pt not completely oriented. CSW called pt daughter [name] who is agreeable to SNF workup but is unsure if pt would be agreeable. Daughter states that pt lives alone in [place] and has private duty aide services 7 days/week but that pt does not allow them in the home so they act more as a wellness check. Daughter is only aware of pt having a walker and unsure about any other DME. Pt is vaccinated x2 and has booster. Daughter lives in [place] and prefers [place] and [place] areas for [employer]. CSW to complete FL2 and fax bed requests in hub.   Expected Discharge Plan: Skilled Nursing Facility Barriers to Discharge: Continued Medical Work up   Patient Goals and CMS Choice        Expected Discharge Plan and Services Expected Discharge Plan: [place] arrangements for the past 2 months: Single Family Home                                      Prior Living Arrangements/Services Living arrangements for the past 2 months: Single Family Home Lives with:: Self          Need for Family Participation in Patient Care: Yes (Comment) Care giver support system in place?: No (comment) Current home services: Homehealth aide    Activities of Daily Living   ADL Screening (condition at time of admission) Is the patient deaf or have difficulty hearing?: No Does the patient have difficulty seeing, even when wearing glasses/contacts?: No Does the patient have difficulty concentrating, remembering, or making decisions?: No Patient able to express need for assistance with ADLs?: No Does the patient have difficulty walking or climbing stairs?: Yes Weakness of  Legs: Both  Permission Sought/Granted                  Emotional Assessment         Alcohol / Substance Use: Alcohol Use Psych Involvement: No (comment)  Admission diagnosis:  Dehydration [E86.0] Trauma [T14.90XA] Fall [W19.XXXA] Elevated CK [R74.8] Fall, initial encounter [id number]] Patient Active Problem List   Diagnosis Date Noted   Generalized weakness 05/19/2021   Rhabdomyolysis 23/30/0762   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[place]) 05/19/2021   Atherosclerosis of aorta ([place]) 05/19/2021   Multiple skin tears 05/19/2021   Multiple Bruising sites 05/19/2021   Poor historian 05/19/2021   Acute renal injury ([place]) 05/19/2021   Transaminitis 05/19/2021   Bilirubinemia 05/19/2021   Hypoproteinemia ([place]) 05/19/2021   Elevated troponin 05/19/2021   Vitamin B12 deficiency 05/19/2021   Macrocytic anemia 05/19/2021   Thrombocytopenia ([place]) 05/19/2021   Leukocytosis 05/19/2021   TSH elevation 05/19/2021   Pyuria 05/19/2021   Hematuria 05/19/2021   Dehydration 05/19/2021   Cerebral atrophy ([place]) 05/19/2021   Degenerative disc disease, cervical 05/19/2021   Bilateral Periorbital ecchymosis 05/19/2021   Myocardial injury    Elevated CK    Fall 05/18/2021   Multiple closed fractures of ribs of left side 05/18/2021   PCP:  Asencion Noble, MD Pharmacy:  [facility], [place] [address] [place]  [address] Phone: [phone] Fax: [phone]     Social Determinants of Health (SDOH) Interventions    Readmission Risk Interventions No flowsheet data found.

## 2021-05-21 ENCOUNTER — Inpatient Hospital Stay (HOSPITAL_COMMUNITY): Payer: Medicare Other

## 2021-05-21 DIAGNOSIS — R4189 Other symptoms and signs involving cognitive functions and awareness: Secondary | ICD-10-CM

## 2021-05-21 DIAGNOSIS — I509 Heart failure, unspecified: Secondary | ICD-10-CM

## 2021-05-21 DIAGNOSIS — E441 Mild protein-calorie malnutrition: Secondary | ICD-10-CM | POA: Diagnosis present

## 2021-05-21 DIAGNOSIS — S2242XD Multiple fractures of ribs, left side, subsequent encounter for fracture with routine healing: Secondary | ICD-10-CM

## 2021-05-21 DIAGNOSIS — E43 Unspecified severe protein-calorie malnutrition: Secondary | ICD-10-CM | POA: Diagnosis present

## 2021-05-21 DIAGNOSIS — N183 Chronic kidney disease, stage 3 unspecified: Secondary | ICD-10-CM | POA: Diagnosis not present

## 2021-05-21 DIAGNOSIS — E440 Moderate protein-calorie malnutrition: Secondary | ICD-10-CM | POA: Insufficient documentation

## 2021-05-21 DIAGNOSIS — E860 Dehydration: Secondary | ICD-10-CM | POA: Diagnosis not present

## 2021-05-21 LAB — GLUCOSE, CAPILLARY
Glucose-Capillary: 101 mg/dL — ABNORMAL HIGH (ref 70–99)
Glucose-Capillary: 107 mg/dL — ABNORMAL HIGH (ref 70–99)
Glucose-Capillary: 147 mg/dL — ABNORMAL HIGH (ref 70–99)
Glucose-Capillary: 72 mg/dL (ref 70–99)
Glucose-Capillary: 83 mg/dL (ref 70–99)
Glucose-Capillary: 83 mg/dL (ref 70–99)
Glucose-Capillary: 83 mg/dL (ref 70–99)
Glucose-Capillary: 93 mg/dL (ref 70–99)
Glucose-Capillary: 95 mg/dL (ref 70–99)

## 2021-05-21 LAB — COMPREHENSIVE METABOLIC PANEL
ALT: 57 U/L — ABNORMAL HIGH (ref 0–44)
AST: 118 U/L — ABNORMAL HIGH (ref 15–41)
Albumin: 2.7 g/dL — ABNORMAL LOW (ref 3.5–5.0)
Alkaline Phosphatase: 57 U/L (ref 38–126)
Anion gap: 14 (ref 5–15)
BUN: 17 mg/dL (ref 8–23)
CO2: 19 mmol/L — ABNORMAL LOW (ref 22–32)
Calcium: 7.9 mg/dL — ABNORMAL LOW (ref 8.9–10.3)
Chloride: 106 mmol/L (ref 98–111)
Creatinine, Ser: 0.91 mg/dL (ref 0.44–1.00)
GFR, Estimated: >60 mL/min (ref >60)
Glucose, Bld: 93 mg/dL (ref 70–99)
Potassium: 3.1 mmol/L — ABNORMAL LOW (ref 3.5–5.1)
Sodium: 139 mmol/L (ref 135–145)
Total Bilirubin: 0.8 mg/dL (ref 0.3–1.2)
Total Protein: 5.1 g/dL — ABNORMAL LOW (ref 6.5–8.1)

## 2021-05-21 LAB — BASIC METABOLIC PANEL
Anion gap: 10 (ref 5–15)
BUN: 14 mg/dL (ref 8–23)
CO2: 22 mmol/L (ref 22–32)
Calcium: 8.3 mg/dL — ABNORMAL LOW (ref 8.9–10.3)
Chloride: 105 mmol/L (ref 98–111)
Creatinine, Ser: 0.86 mg/dL (ref 0.44–1.00)
GFR, Estimated: >60 mL/min (ref >60)
Glucose, Bld: 108 mg/dL — ABNORMAL HIGH (ref 70–99)
Potassium: 3.4 mmol/L — ABNORMAL LOW (ref 3.5–5.1)
Sodium: 137 mmol/L (ref 135–145)

## 2021-05-21 LAB — CBC
HCT: 31 % — ABNORMAL LOW (ref 36.0–46.0)
Hemoglobin: 10.2 g/dL — ABNORMAL LOW (ref 12.0–15.0)
MCH: 32.7 pg (ref 26.0–34.0)
MCHC: 32.9 g/dL (ref 30.0–36.0)
MCV: 99.4 fL (ref 80.0–100.0)
Platelets: 70 10*3/uL — ABNORMAL LOW (ref 150–400)
RBC: 3.12 MIL/uL — ABNORMAL LOW (ref 3.87–5.11)
RDW: 14.9 % (ref 11.5–15.5)
WBC: 7.6 10*3/uL (ref 4.0–10.5)
nRBC: 0.3 % — ABNORMAL HIGH (ref 0.0–0.2)

## 2021-05-21 LAB — URINE CULTURE: Culture: 60000 — AB

## 2021-05-21 LAB — TROPONIN I (HIGH SENSITIVITY): Troponin I (High Sensitivity): 731 ng/L (ref <18)

## 2021-05-21 LAB — CK: Total CK: 1886 U/L — ABNORMAL HIGH (ref 38–234)

## 2021-05-21 MED ORDER — POTASSIUM CHLORIDE 20 MEQ PO PACK
40.0000 meq | PACK | Freq: Once | ORAL | Status: AC
  Administered 2021-05-21: 40 meq via ORAL
  Filled 2021-05-21: qty 2

## 2021-05-21 MED ORDER — BOOST / RESOURCE BREEZE PO LIQD CUSTOM
1.0000 | Freq: Three times a day (TID) | ORAL | Status: DC
  Administered 2021-05-21 – 2021-05-24 (×7): 1 via ORAL

## 2021-05-21 MED ORDER — PROSOURCE PLUS PO LIQD
30.0000 mL | Freq: Two times a day (BID) | ORAL | Status: DC
  Administered 2021-05-22 – 2021-05-24 (×4): 30 mL via ORAL
  Filled 2021-05-21 (×3): qty 30

## 2021-05-21 MED ORDER — LOPERAMIDE HCL 2 MG PO CAPS
2.0000 mg | ORAL_CAPSULE | ORAL | Status: DC | PRN
Start: 2021-05-21 — End: 2021-05-24
  Administered 2021-05-21 – 2021-05-22 (×3): 2 mg via ORAL
  Filled 2021-05-21 (×3): qty 1

## 2021-05-21 MED ORDER — FUROSEMIDE 10 MG/ML IJ SOLN
40.0000 mg | Freq: Once | INTRAMUSCULAR | Status: AC
  Administered 2021-05-21: 40 mg via INTRAVENOUS
  Filled 2021-05-21: qty 4

## 2021-05-21 MED ORDER — LOSARTAN POTASSIUM 25 MG PO TABS
12.5000 mg | ORAL_TABLET | Freq: Every day | ORAL | Status: DC
  Administered 2021-05-21: 12.5 mg via ORAL
  Filled 2021-05-21: qty 1

## 2021-05-21 MED ORDER — SODIUM CHLORIDE 0.9 % IV SOLN: INTRAVENOUS | Status: DC

## 2021-05-21 NOTE — Progress Notes (Signed)
Progress Note  Patient Name: [name] Date of Encounter: 05/21/2021  Primary Cardiologist: New to [facility]   Subjective   Acutely short of breath in the evening, called urgently to the bedside.  Inpatient Medications    Scheduled Meds:  bacitracin   Topical BID   calcium-vitamin D  1 tablet Oral Q breakfast   cycloSPORINE  1 drop Both Eyes BID   escitalopram  10 mg Oral q AM   feeding supplement  237 mL Oral BID BM   folic acid  1 mg Oral Daily   metoprolol succinate  100 mg Oral q AM   multivitamin with minerals  1 tablet Oral Daily   thiamine  100 mg Oral q AM   vitamin B-12  1,000 mcg Oral Daily   Continuous Infusions:  lactated ringers 125 mL/hr at 05/20/21 0859   PRN Meds: acetaminophen, lidocaine, LORazepam **OR** LORazepam   Vital Signs    Vitals:   05/20/21 0842 05/20/21 1435 05/20/21 1959 05/21/21 0438  BP:  135/84 135/85 133/79  Pulse:  78 79 84  Resp:  16 (!) 22 18  Temp:  99.1 F (37.3 C) 98.4 F (36.9 C) 98.5 F (36.9 C)  TempSrc:  Oral Oral Oral  SpO2: 99% 100% 94% 92%  Weight:      Height:        Intake/Output Summary (Last 24 hours) at 05/21/2021 0728 Last data filed at 05/21/2021 0333 Gross per 24 hour  Intake 912.4 ml  Output 0 ml  Net 912.4 ml   Filed Weights   05/18/21 1003  Weight: 45 kg    Physical Exam   GEN: No acute distress.   Neck: no JVD Cardiac: RRR, no murmurs, rubs, or gallops.  Respiratory: crackles in bases GI: Soft, nontender, non-distended  MS: No edema; No deformity. Neuro:  Nonfocal  Psych: Normal affect    Labs    Chemistry Recent Labs  Lab 05/18/21 1000 05/19/21 0221 05/20/21 0029 05/21/21 0113  NA 143 138 138 139  K 4.1 4.0 4.0 3.1*  CL 110 102 108 106  CO2 15* 17* 17* 19*  GLUCOSE 162* 83 66* 93  BUN 22 22 22 17   CREATININE 1.55* 1.36* 1.26* 0.91  CALCIUM 8.7* 8.1* 7.5* 7.9*  PROT 6.4* 5.5*  --  5.1*  ALBUMIN 3.9 3.3*  --  2.7*  AST 116* 118*  --  118*  ALT 43 46*  --  57*   ALKPHOS 85 61  --  57  BILITOT 1.6* 1.3*  --  0.8  GFRNONAA 34* 40* 43* >60  ANIONGAP 18* 19* 13 14     Hematology Recent Labs  Lab 05/19/21 0221 05/20/21 0029 05/21/21 0113  WBC 12.3* 12.2* 7.6  RBC 3.24* 3.39* 3.12*  HGB 10.5* 11.0* 10.2*  HCT 32.7* 34.0* 31.0*  MCV 100.9* 100.3* 99.4  MCH 32.4 32.4 32.7  MCHC 32.1 32.4 32.9  RDW 15.2 15.1 14.9  PLT 68* 63* 70*    Cardiac EnzymesNo results for input(s): TROPONINI in the last 168 hours. No results for input(s): TROPIPOC in the last 168 hours.   BNPNo results for input(s): BNP, PROBNP in the last 168 hours.   DDimer No results for input(s): DDIMER in the last 168 hours.   Radiology    ECHOCARDIOGRAM COMPLETE  Result Date: 05/19/2021    ECHOCARDIOGRAM REPORT   Patient Name:   MYALEE STENGEL Date of Exam: 05/19/2021 Medical Rec #:  [id number]  Height:       60.0 in Accession #:    [id number]       Weight:       99.2 lb Date of Birth:  [birth year]       BSA:          1.386 m Patient Age:    80 years         BP:           120/94 mmHg Patient Gender: F                HR:           88 bpm. Exam Location:  Inpatient Procedure: 2D Echo, Cardiac Doppler and Color Doppler Indications:    Elevated troponin  History:        Patient has no prior history of Echocardiogram examinations.  Sonographer:    Cammy Brochure Referring Phys: [place]  1. The entire mid ventricle to apex is akinetic. 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 . Left ventricular ejection fraction, by estimation, is 35%. The left ventricle has moderately decreased function. The left ventricle demonstrates regional wall motion abnormalities (see scoring diagram/findings for description). Left ventricular diastolic parameters are indeterminate.  2. Right ventricular systolic function is normal. The right ventricular size is normal.  3. The mitral valve is normal in structure. Mild to moderate mitral valve  regurgitation.  4. The aortic valve is tricuspid. Aortic valve regurgitation is mild. No aortic stenosis is present.  5. The inferior vena cava is normal in size with greater than 50% respiratory variability, suggesting right atrial pressure of 3 mmHg.  6. Cannot exclude small pfo with left to right shunt. FINDINGS  Left Ventricle: The entire mid ventricle to apex is akinetic. 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 Left ventricular ejection fraction, by estimation, is 35%. The left ventricle has moderately decreased function. The left ventricle demonstrates regional wall motion abnormalities. The left ventricular internal cavity size was normal in size. There is no left ventricular hypertrophy. Left ventricular diastolic parameters are indeterminate. Normal left ventricular filling pressure. Right Ventricle: The right ventricular size is normal. No increase in right ventricular wall thickness. Right ventricular systolic function is normal. Left Atrium: Left atrial size was normal in size. Right Atrium: Right atrial size was normal in size. Pericardium: There is no evidence of pericardial effusion. Mitral Valve: The mitral valve is normal in structure. Mild to moderate mitral valve regurgitation. Tricuspid Valve: The tricuspid valve is normal in structure. Tricuspid valve regurgitation is mild . No evidence of tricuspid stenosis. Aortic Valve: The aortic valve is tricuspid. Aortic valve regurgitation is mild. No aortic stenosis is present. Aortic valve mean gradient measures 2.0 mmHg. Aortic valve peak gradient measures 3.3 mmHg. Aortic valve area, by VTI measures 2.09 cm. Pulmonic Valve: The pulmonic valve was not well visualized. Pulmonic valve regurgitation is not visualized. No evidence of pulmonic stenosis. Aorta: The aortic root is normal in size and structure. Pulmonary Artery: Mild pulmonary HTN, PASP is 36 mmHg. Venous: The inferior vena cava is normal in size with greater  than 50% respiratory variability, suggesting right atrial pressure of 3 mmHg. IAS/Shunts: Cannot exclude small pfo with left to right shunt.  LEFT VENTRICLE PLAX 2D LVIDd:         3.90 cm  Diastology LVIDs:         2.50 cm  LV e' medial:    5.98 cm/s LV PW:  0.90 cm  LV E/e' medial:  9.6 LV IVS:        1.00 cm  LV e' lateral:   5.87 cm/s LVOT diam:     2.00 cm  LV E/e' lateral: 9.8 LV SV:         36 LV SV Index:   26 LVOT Area:     3.14 cm  RIGHT VENTRICLE            IVC RV Basal diam:  2.90 cm    IVC diam: 1.40 cm RV S prime:     9.36 cm/s TAPSE (M-mode): 1.9 cm LEFT ATRIUM             Index       RIGHT ATRIUM           Index LA diam:        2.60 cm 1.88 cm/m  RA Area:     10.76 cm LA Vol (A2C):   33.9 ml 24.47 ml/m RA Volume:   24.25 ml  17.50 ml/m LA Vol (A4C):   35.2 ml 25.40 ml/m LA Biplane Vol: 34.5 ml 24.90 ml/m  AORTIC VALVE AV Area (Vmax):    2.31 cm AV Area (Vmean):   2.14 cm AV Area (VTI):     2.09 cm AV Vmax:           91.20 cm/s AV Vmean:          60.800 cm/s AV VTI:            0.173 m AV Peak Grad:      3.3 mmHg AV Mean Grad:      2.0 mmHg LVOT Vmax:         67.10 cm/s LVOT Vmean:        41.400 cm/s LVOT VTI:          0.115 m LVOT/AV VTI ratio: 0.66  AORTA Ao Root diam: 2.70 cm Ao Asc diam:  2.70 cm MITRAL VALVE               TRICUSPID VALVE MV Area (PHT): 4.89 cm    TR Peak grad:   32.5 mmHg MV Decel Time: 155 msec    TR Vmax:        285.00 cm/s MV E velocity: 57.50 cm/s MV A velocity: 75.80 cm/s  SHUNTS MV E/A ratio:  0.76        Systemic VTI:  0.12 m                            Systemic Diam: 2.00 cm Carlyle Dolly MD Electronically signed by Carlyle Dolly MD Signature Date/Time: 05/19/2021/2:41:19 PM    Final     Telemetry    05/21/21 NSR/ST  - Personally Reviewed  ECG    No new tracing as of 05/21/21 - Personally Reviewed  Cardiac Studies   Echocardiogram 05/19/21:   1. The entire mid ventricle to apex is akinetic. Pattern could be  suggestive of Takostubo/stress  induced cardiomyopathy. . Left ventricular  ejection fraction, by estimation, is 35%. The left ventricle has  moderately decreased function. The left  ventricle demonstrates regional wall motion abnormalities (see scoring  diagram/findings for description). Left ventricular diastolic parameters  are indeterminate.   2. Right ventricular systolic function is normal. The right ventricular  size is normal.   3. The mitral valve is normal in structure. Mild to moderate mitral valve  regurgitation.   4. The  aortic valve is tricuspid. Aortic valve regurgitation is mild. No  aortic stenosis is present.   5. The inferior vena cava is normal in size with greater than 50%  respiratory variability, suggesting right atrial pressure of 3 mmHg.   6. Cannot exclude small pfo with left to right shunt.   Patient Profile     80 y.o. 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 Cardiology is consulted for elevated troponin.   Assessment & Plan    1. HFrEF: -she became acutely short of breath today, likely from volume resuscitation for rhabdomyolysis in setting of reduced EF. Cr has improved and she may do well with a dose of lasix and monitoring for symptoms. CK still elevated, may continue to need IV fluids, management of rhabdo per primary service.  - ECG with T wave inversions, suggestion of Wellens T waves on prior ECGs. We would like to eventually pursue an ischemic evaluation however she has been somewhat disoriented in days prior. Appears clear today but still not an optimal candidate for intervention. If chest pain recurs or if she decompensates in HF, we will consider inpatient stress or cath.  - continue BB   2. 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 CKD stage III: -Unclear baseline>>>creatinine this admission at 1.55>>1.36>>1.26>>>0.91 today  - would start GDMT when creatinine stable.    4. ETOH disorder: -Daughter  bedside who report years of alcohol abuse. Pt lives alone and was found face down after at least 12 hours. This complicates the use of DAPT if intervention needed. Will reassess daily and now Psych is involved which may help going forward.    Elouise Munroe, MD For questions or updates, please contact   Please consult [email or web address] for contact info under Cardiology/STEMI.

## 2021-05-21 NOTE — Progress Notes (Signed)
FPTS Interim Progress Note  S:  Received call from nurse about patient complaining of chest tightness and working harder to breathe.  Told nurse to call cardiology and went to see patient.  Patient indicates is feeling fine.    O: BP (!) 159/79 (BP Location: Right Leg)   Pulse 80   Temp 98 F (36.7 C) (Axillary)   Resp 18   Ht 5' (1.524 m)   Wt 45 kg   SpO2 94%   BMI 19.38 kg/m    Physical Exam HENT:     Head: Normocephalic and atraumatic.     Mouth/Throat:     Mouth: Mucous membranes are moist.  Cardiovascular:     Rate and Rhythm: Normal rate and regular rhythm.     Heart sounds: Normal heart sounds.  Pulmonary:     Breath sounds: Rales present.     Comments: Increased work of breathing, crackles on lung exam bilaterally Abdominal:     Tenderness: There is abdominal tenderness.  Skin:    General: Skin is warm.     Capillary Refill: Capillary refill takes less than 2 seconds.  Neurological:     General: No focal deficit present.     Mental Status: She is alert.     A/P: Likely fluid overload from aggressive IV hydration. - Obtain trops, EKG, chest X-Ray and Stop IV fluids - Spoke with Cardiologist who also saw patient and agrees with plan  Delora Fuel, MD 05/21/2021, 7:06 PM PGY-1, [place] [facility] Service pager [phone]

## 2021-05-21 NOTE — Care Management Important Message (Signed)
Important Message  Patient Details  Name: [name] MRN: [id number] Date of Birth: 02-27-1941   Medicare Important Message Given:  Yes     Orbie Pyo 05/21/2021, 2:08 PM

## 2021-05-21 NOTE — TOC CAGE-AID Note (Signed)
Transition of Care [facility]) - CAGE-AID Screening   Patient Details  Name: [name] MRN: [id number] Date of Birth: Jul 14, 1941  Transition of Care [facility]) CM/SW Contact:    Bethann Berkshire, [place] Phone Number: 05/21/2021, 12:57 PM   Clinical Narrative:  CSW met with pt for CAGE-AID with her 2 daughters present. She agrees discuss with them present. CAGE-AID completed with score of 3. CSW attempted to inquire about pt's level of alcohol use. She is vague about her use and denies daily drinking. She denies any other substance use. Pt minimizes her alcohol use and does not indicate any desire to quit or reduce drinking. She does accept resources at bedside.    CAGE-AID Screening:    Have You Ever Felt You Ought to Cut Down on Your Drinking or Drug Use?: Yes Have People Annoyed You By Critizing Your Drinking Or Drug Use?: Yes Have You Felt Bad Or Guilty About Your Drinking Or Drug Use?: Yes Have You Ever Had a Drink or Used Drugs First Thing In The Morning to Steady Your Nerves or to Get Rid of a Hangover?: No CAGE-AID Score: 3  Substance Abuse Education Offered: Yes  Substance abuse interventions: Patient Counseling, Scientist, clinical (histocompatibility and immunogenetics)

## 2021-05-21 NOTE — Progress Notes (Signed)
Family Medicine Teaching Service Daily Progress Note Intern Pager: [phone]  Patient name: [name] Medical record number: [id number] Date of birth: August 25, 1941 Age: 80 y.o. Gender: female  Primary Care Provider: Asencion Noble, MD Consultants: Cardiology, Wound care Code Status: Full  Pt Overview and Major Events to Date:  05/18/2021: Admitted to FPTS  Assessment and Plan: [name] is a 80 year old female presenting after fall.  PMH is significant for anxiety, arthritis, alcohol use disorder, carcinoid bronchial adenoma of right lung, depression, GERD, kidney stones, liver disease, osteoporosis, subarachnoid hemorrhage, macrocytic anemia and thrombocytopenia.  Hypokalemia K+ 3.1 this am --Kcl 40 meq --BMP in am --Monitor closely  Fall I Agitation No acute overnight events.  Palliative care is following.  PT suggested SNF placement.  Psychiatric consult for determination of patient's medical decision-making capacity. --Awaiting psychiatry recommendations. --Palliative care following, appreciate their assistance --PT/OT eval and treat --Delirium precautions --Fall precautions --CBC in am --BMP in am  Thrombocytopenia I Macrocytic anemia  Platelets 68>63>70, Hg 10.5>11>10.2 -- Hold chemical prophylaxis --CBC in a.m.    Alcohol use disorder CIWA 0's, no Ativan received yesterday.  --c/w CIWA protocol --d/c Ativan PRN --Alcohol cessation counseling --Nutritional consult  Rhabdomyolysis CK 1071>3028>3616>1886 --c/w mIVF @125  for 12 hrs  Leg wounds, superficial lacerations Wounds look better today, bruising in different stages of healing. --Monitor closley for any sign of infection  AKI on CKD III-improved Scr 1.55>1.36>1.26>0.91 --BMP in am --Monitor closely   New onset cardiomyopathy Cardiology is following and they are suggesting no concern of ACS no will likely need further work-up with stress testing versus CCTA however because its not acute and can be  done in outpatient setting depending on patient's recovery. --Cardiology following, appreciate their recommendations  FEN/GI: Regular diet PPx: SCD's vsTED hose   Status is: Inpatient  Remains inpatient appropriate because:Unsafe d/c plan  Dispo: The patient is from: Home              Anticipated d/c is to: SNF              Patient currently is not medically stable to d/c.   Difficult to place patient No        Subjective:  No acute overnight events. Patient evaluated at bedside this AM and states she feels sleepy but denies any other complaints.  Objective: Temp:  [98.4 F (36.9 C)-99.1 F (37.3 C)] 98.5 F (36.9 C) (06/21 0438) Pulse Rate:  [78-84] 84 (06/21 0438) Resp:  [16-22] 18 (06/21 0438) BP: (133-135)/(79-85) 133/79 (06/21 0438) SpO2:  [92 %-100 %] 92 % (06/21 0438) Physical Exam: General: Frail elderly lady sitting comfortably in bed, NAD Cardiovascular: Regular rate and rhythm Respiratory: Clear to auscultation bilaterally Abdomen: Soft, nontender, nondistended, bowel sounds present Extremities: Able to move all extremities well Skin: Many, ecchymotic areas in different stages of healing  Laboratory: Recent Labs  Lab 05/19/21 0221 05/20/21 0029 05/21/21 0113  WBC 12.3* 12.2* 7.6  HGB 10.5* 11.0* 10.2*  HCT 32.7* 34.0* 31.0*  PLT 68* 63* 70*   Recent Labs  Lab 05/18/21 1000 05/19/21 0221 05/20/21 0029 05/21/21 0113  NA 143 138 138 139  K 4.1 4.0 4.0 3.1*  CL 110 102 108 106  CO2 15* 17* 17* 19*  BUN 22 22 22 17   CREATININE 1.55* 1.36* 1.26* 0.91  CALCIUM 8.7* 8.1* 7.5* 7.9*  PROT 6.4* 5.5*  --  5.1*  BILITOT 1.6* 1.3*  --  0.8  ALKPHOS 85 61  --  57  ALT 43 46*  --  57*  AST 116* 118*  --  118*  GLUCOSE 162* 83 66* 93    Imaging/Diagnostic Tests: No results found.   Honor Junes, MD 05/21/2021, 9:15 AM PGY-1, [place] Intern pager: [phone], text pages welcome

## 2021-05-21 NOTE — Progress Notes (Signed)
Patient was complaining of SOB and appeared to be having increased WOB and accessory muscle use. She complained of feeling a tightness in her chest so I paged the hospitalist and cardiologist- new orders acknowledged and received. IVF d/c'd, night shift RN to give Lasix and tech to accomplish EKG.

## 2021-05-21 NOTE — Progress Notes (Signed)
FPTS Interim Progress Note  S: Reported to bedside in order to evaluate patient for nighttime shift.  Patient was sitting upright in bed watching television.  Patient denies continued chest discomfort reported earlier this evening.  She reports some abnormality with her breathing but has a hard time describing it.  She reports that she does not feel like she is having painful breathing or that she is feels short of breath but that she can "hear" her breathing.  She denies a sensation of nasal congestion or chest congestion. Patient's daughter was not at the bedside at time of this encounter.   O: BP (!) 163/108 (BP Location: Left Arm)   Pulse 85   Temp 98.1 F (36.7 C) (Oral)   Resp 20   Ht 5' (1.524 m)   Wt 45 kg   SpO2 92%   BMI 19.38 kg/m   General: elderly female appearing stated age in no acute distress Cardio: Rhythm is regular. Bilateral radial pulses palpable Pulm: supracostal retractions noted, speaking in complete sentences without signs of respiratory distress, [place] in place, crackles present at bilateral bases  Abdomen: Bowel sounds normal. Abdomen soft and non-tender.  Extremities: bilateral LE in white leg coverings with knees wrapped, able to move extremities  Neuro: pt alert and oriented to self, location, year, president & month, follows commands Skin: multiple healing ecchymosis   A/P: Hypokalemia: repeat BMP scheduled for 2200, will follow up with results as available and replaced PRN   Fall  Leg Wounds  CK down to 1886 from 3600   - wounds wrapped, dressing dry and intact  - multiple areas of ecchymosis on Upper extremities appear to be healing  - bruising around the eyes decreased from previous examination on 6/20 evening   New Cardiomyopathy Patient denies continued chest discomfort reported earlier this evening. Troponin trend 3007>622 (thought to be due to rhabdo)  - appreciate cardiology consult and recommendations  -  s/p one dose of lasix  - CXR  consistent with pulmonary edema - monitor urine output  -monitor for reoccurence of chest discomfort, cards considering further imaging for ischemia evaluation  - EKG with inverted T waves   HTN  BP 163/108 most recently, home metoprolol 100mg  daily reordered   Alcohol Use Disorder  - continue CIWA, previously required Ativan dosing, last dose 6/19, most recent CIWA score of 0 at midnight     [name], MD 05/21/2021, 9:52 PM PGY-2, [place] Service pager [phone]

## 2021-05-21 NOTE — Consult Note (Signed)
[facility] Consult   Reason for Consult:  Capacity evaluation Referring Physician: Lissa Morales, MD Patient Identification: [name] MRN:  [id number] Principal Diagnosis: Dehydration Diagnosis:  Principal Problem:   Dehydration Active Problems:   Fall   Myocardial injury   Generalized weakness   Rhabdomyolysis   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HCC)   Atherosclerosis of aorta (HCC)   Multiple skin tears   Multiple Bruising sites   Poor historian   Acute renal injury ([place])   Transaminitis   Bilirubinemia   Hypoproteinemia (HCC)   Elevated troponin   Vitamin B12 deficiency   Macrocytic anemia   Thrombocytopenia (HCC)   Leukocytosis   TSH elevation   Pyuria   Hematuria   Cerebral atrophy (HCC)   Degenerative disc disease, cervical   Bilateral Periorbital ecchymosis   Elevated CK   Multiple closed fractures of ribs of left side   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 female patient admitted  after a fall ans subsequently found to have rhabdo, thrombocytopenia w/ macrocytic anemia 2/2 EtoH use disorder, AKI on CKD 3,elevated Trp 2/2 rhabdo, and HFrEF. Patient has a PMH of anxiety, arthritis, alcohol use disorder, carcinoid bronchial adenoma of right lung, depression, GERD, kidney stones, liver disease, osteoporosis, subarachnoid hemorrhage, macrocytic anemia and thrombocytopenia. During stay patient was noted to be very agitated and there has been question if patient has the capacity to make her own decisions.   HPI: On assessment this AM patient is alert and oriented to person, place, time and somewhat oriented to her situation. Patient reports that she does have a very poor memory and does not remember things that happened within the past 2 hours and does not really remember her Provider coming to see her in the morning. Patient's daughter [name]  is in the room and notes that the provider has already been by.   In terms of capacity patient is aware that she is in the hospital for a recent fall. However patient denies significant EtoH hx and reports that she was only drinking "a little" and does not relate her drinking to being a significant problem and contribution to her overall poor health. Patient's daughter signaled to Provider that the patient had been excessively drinking despite what she was saying.  Provider asked patient if she could place herself in her daughter's shoes and how she would feel if she had found her own mother on the ground. Patient was able to report that she would feel a sense of urgency to get her mother medical care. Patient was able to report that she does believe that her daughter is concerned about her. However patient does not think there is an issue with her level of safety. Provider broached to the topic of changing patient's living situation to accommodate for safety and patient became irate and shouted "I am not ready to leave my home!" Patient would not even speak about other options. Patient's daughter spoke with the patient about possibly finding a middle ground and having someone come into the home, yet patient had a hard time grasping this concept. After some discussion the patient admitted that her house is not as well kept as it used to be but she did not seem to understand why the level of uncleanness was a safety concern and felt that it was perfectly ok to live the way she was despite her  daughter noting that the house is very cluttered and could lead to another fall. Patient was not willing to talk about the consequences of not accepting some help at home or moving into assisted living. 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 Patient expressed that  she used to be very social and had parties. However now she no longer cares about the state of her home "because no one comes any more" and notes that a lot of her close friends have died. Patient also noted the pandemic put a hamper on her bridge club and her physical activities at the [facility]. Patient's daughter noted she is concerned that her mother is depressed. Patient reported she does not think she is depressed, but did not that she does feel less motivated, more anhedonic, has poor concentration, and feels less worthy. Patient's daughter noted that the patient is drinking more, has less energy, and is more irritable. Patient denies SI, HI, and AVH. Patient reported that she does feel that she has racing thoughts and this may be some of why she drinks, but she thinks she mostly drinks because she "likes the taste."  Past Psychiatric History: Depression  Risk to Self:  NO Risk to Others:  NO Prior Inpatient Therapy:  NO Prior Outpatient Therapy:  NO  Past Medical History:  Past Medical History:  Diagnosis Date   Abdominal wall hernia, periumbilical, fat-containing 05/19/2021   CT [facility] 05/18/21: Fat containing periumbilical anterior abdominal wall hernia.  Second fat and bowel containing infraumbilical anterior abdominal wall hernia. No CT evidence of bowel incarceration.   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   Macrocytic anemia 05/19/2021   Multiple closed fractures of ribs of left side 05/18/2021   Osteoporosis    Treated with denosumab   Physical deconditioning     Recurrent falls    Recurrent kidney stones    Calcium oxalate monohydrate on stone analysis 05/2020 at [facility] urology   Seizure [facility])    started on Keppra after [facility]   Subarachnoid hemorrhage ([place]) 12/19/2019    Past Surgical History:  Procedure Laterality Date   ABDOMINAL HYSTERECTOMY     Vaginal   APPENDECTOMY     CARPAL TUNNEL RELEASE Right 01/02/2012   Dr Fredna Dow ([facility])   CYSTOSCOPY W/ RETROGRADES Bilateral 02/28/2018   Procedure: CYSTOSCOPY WITH BILATERAL RETROGRADE PYELOGRAM;BILATERAL URETERAL STENT PLACEMENT;  Surgeon: Cleon Gustin, MD;  Location: [facility];  Service: Urology;  Laterality: Bilateral;   SMALL INTESTINE SURGERY  2004   Iliectomy   THORACOTOMY  12/01/2005   VATS Right Upper Lung Lobe - Dr Arlyce Dice ([facility])   TONSILLECTOMY     Family History: History reviewed. No pertinent family history. Family Psychiatric  History: Unknown  Social History:  Social History   Substance and Sexual Activity  Alcohol Use None     Social History   Substance and Sexual Activity  Drug Use Not on file  Social History   Socioeconomic History   Marital status: Widowed    Spouse name: Not on file   Number of children: Not on file   Years of education: Not on file   Highest education level: Not on file  Occupational History   Not on file  Tobacco Use   Smoking status: Not on file   Smokeless tobacco: Not on file  Substance and Sexual Activity   Alcohol use: Not on file   Drug use: Not on file   Sexual activity: Not on file  Other Topics Concern   Not on file  Social History Narrative   Not on file   Social Determinants of Health   Financial Resource Strain: Not on file  Food Insecurity: Not on file  Transportation Needs: Not on file  Physical Activity: Not on file  Stress: Not on file  Social Connections: Not on file   Additional Social History:    Allergies:   Allergies  Allergen Reactions   Clindamycin/Lincomycin Rash   Doxycycline Rash     Labs:  Results for orders placed or performed during the hospital encounter of 05/18/21 (from the past 48 hour(s))  CBC with Differential/Platelet     Status: Abnormal   Collection Time: 05/20/21 12:29 AM  Result Value Ref Range   WBC 12.2 (H) 4.0 - 10.5 K/uL   RBC 3.39 (L) 3.87 - 5.11 MIL/uL   Hemoglobin 11.0 (L) 12.0 - 15.0 g/dL   HCT 34.0 (L) 36.0 - 46.0 %   MCV 100.3 (H) 80.0 - 100.0 fL   MCH 32.4 26.0 - 34.0 pg   MCHC 32.4 30.0 - 36.0 g/dL   RDW 15.1 11.5 - 15.5 %   Platelets 63 (L) 150 - 400 K/uL    Comment: Immature Platelet Fraction may be clinically indicated, consider ordering this additional test [id number] CONSISTENT WITH PREVIOUS RESULT    nRBC 0.2 0.0 - 0.2 %   Neutrophils Relative % 84 %   Neutro Abs 10.3 (H) 1.7 - 7.7 K/uL   Lymphocytes Relative 8 %   Lymphs Abs 1.0 0.7 - 4.0 K/uL   Monocytes Relative 7 %   Monocytes Absolute 0.8 0.1 - 1.0 K/uL   Eosinophils Relative 0 %   Eosinophils Absolute 0.0 0.0 - 0.5 K/uL   Basophils Relative 0 %   Basophils Absolute 0.0 0.0 - 0.1 K/uL   Immature Granulocytes 1 %   Abs Immature Granulocytes 0.09 (H) 0.00 - 0.07 K/uL    Comment: Performed at [facility], [address], [place], [place] [address]  Basic metabolic panel     Status: Abnormal   Collection Time: 05/20/21 12:29 AM  Result Value Ref Range   Sodium 138 135 - 145 mmol/L   Potassium 4.0 3.5 - 5.1 mmol/L   Chloride 108 98 - 111 mmol/L   CO2 17 (L) 22 - 32 mmol/L   Glucose, Bld 66 (L) 70 - 99 mg/dL    Comment: Glucose reference range applies only to samples taken after fasting for at least 8 hours.   BUN 22 8 - 23 mg/dL   Creatinine, Ser 1.26 (H) 0.44 - 1.00 mg/dL   Calcium 7.5 (L) 8.9 - 10.3 mg/dL   GFR, Estimated 43 (L) >60 mL/min    Comment: (NOTE) Calculated using the CKD-EPI Creatinine Equation (2021)    Anion gap 13 5 - 15    Comment: Performed at [facility] [address] [place], [place] [address]  CK     Status: Abnormal    Collection Time: 05/20/21 12:29 AM  Result Value Ref Range   Total CK 3,616 (H) 38 - 234 U/L    Comment: Performed at [facility], [place] [address], [place], [place] [address]  Lipid panel     Status: Abnormal   Collection Time: 05/20/21 12:29 AM  Result Value Ref Range   Cholesterol 181 0 - 200 mg/dL   Triglycerides 204 (H) <150 mg/dL   HDL 105 >40 mg/dL   Total CHOL/HDL Ratio 1.7 RATIO   VLDL 41 (H) 0 - 40 mg/dL   LDL Cholesterol 35 0 - 99 mg/dL    Comment:        Total Cholesterol/HDL:CHD Risk Coronary Heart Disease Risk Table                     Men   Women  1/2 Average Risk   3.4   3.3  Average Risk       5.0   4.4  2 X Average Risk   9.6   7.1  3 X Average Risk  23.4   11.0        Use the calculated Patient Ratio above and the CHD Risk Table to determine the patient's CHD Risk.        ATP III CLASSIFICATION (LDL):  <100     mg/dL   Optimal  100-129  mg/dL   Near or Above                    Optimal  130-159  mg/dL   Borderline  160-189  mg/dL   High  >190     mg/dL   Very High Performed at [facility] [address] [place], [place] [address]   Magnesium     Status: None   Collection Time: 05/20/21 12:29 AM  Result Value Ref Range   Magnesium 2.0 1.7 - 2.4 mg/dL    Comment: Performed at [facility], [place] [address] [place], [place] [address]  Glucose, capillary     Status: Abnormal   Collection Time: 05/20/21  7:29 AM  Result Value Ref Range   Glucose-Capillary 58 (L) 70 - 99 mg/dL    Comment: Glucose reference range applies only to samples taken after fasting for at least 8 hours.  Glucose, capillary     Status: Abnormal   Collection Time: 05/20/21  7:58 AM  Result Value Ref Range   Glucose-Capillary 141 (H) 70 - 99 mg/dL    Comment: Glucose reference range applies only to samples taken after fasting for at least 8 hours.  Troponin I (High Sensitivity)     Status: Abnormal   Collection Time: 05/20/21 10:42 AM  Result Value Ref Range    Troponin I (High Sensitivity) 1,033 (HH) <18 ng/L    Comment: CRITICAL VALUE NOTED.  VALUE IS CONSISTENT WITH PREVIOUSLY REPORTED AND CALLED VALUE. (NOTE) Elevated high sensitivity troponin I (hsTnI) values and significant  changes across serial measurements may suggest ACS but many other  chronic and acute conditions are known to elevate hsTnI results.  Refer to the Links section for chest pain algorithms and additional  guidance. Performed at [facility], [place] [address], [place], [place] [address]   Glucose, capillary     Status: Abnormal   Collection Time: 05/20/21 11:39 AM  Result Value Ref Range   Glucose-Capillary 102 (H) 70 - 99 mg/dL  Comment: Glucose reference range applies only to samples taken after fasting for at least 8 hours.  Troponin I (High Sensitivity)     Status: Abnormal   Collection Time: 05/20/21  1:03 PM  Result Value Ref Range   Troponin I (High Sensitivity) 814 (HH) <18 ng/L    Comment: CRITICAL VALUE NOTED.  VALUE IS CONSISTENT WITH PREVIOUSLY REPORTED AND CALLED VALUE. (NOTE) Elevated high sensitivity troponin I (hsTnI) values and significant  changes across serial measurements may suggest ACS but many other  chronic and acute conditions are known to elevate hsTnI results.  Refer to the Links section for chest pain algorithms and additional  guidance. Performed at [facility], [place] [address] [place], [place] [address]   Glucose, capillary     Status: None   Collection Time: 05/20/21  2:38 PM  Result Value Ref Range   Glucose-Capillary 86 70 - 99 mg/dL    Comment: Glucose reference range applies only to samples taken after fasting for at least 8 hours.  Glucose, capillary     Status: None   Collection Time: 05/20/21  4:42 PM  Result Value Ref Range   Glucose-Capillary 81 70 - 99 mg/dL    Comment: Glucose reference range applies only to samples taken after fasting for at least 8 hours.  Glucose, capillary     Status: Abnormal    Collection Time: 05/20/21  7:52 PM  Result Value Ref Range   Glucose-Capillary 103 (H) 70 - 99 mg/dL    Comment: Glucose reference range applies only to samples taken after fasting for at least 8 hours.  Glucose, capillary     Status: None   Collection Time: 05/20/21  9:53 PM  Result Value Ref Range   Glucose-Capillary 92 70 - 99 mg/dL    Comment: Glucose reference range applies only to samples taken after fasting for at least 8 hours.  Glucose, capillary     Status: None   Collection Time: 05/21/21 12:10 AM  Result Value Ref Range   Glucose-Capillary 83 70 - 99 mg/dL    Comment: Glucose reference range applies only to samples taken after fasting for at least 8 hours.  CBC     Status: Abnormal   Collection Time: 05/21/21  1:13 AM  Result Value Ref Range   WBC 7.6 4.0 - 10.5 K/uL   RBC 3.12 (L) 3.87 - 5.11 MIL/uL   Hemoglobin 10.2 (L) 12.0 - 15.0 g/dL   HCT 31.0 (L) 36.0 - 46.0 %   MCV 99.4 80.0 - 100.0 fL   MCH 32.7 26.0 - 34.0 pg   MCHC 32.9 30.0 - 36.0 g/dL   RDW 14.9 11.5 - 15.5 %   Platelets 70 (L) 150 - 400 K/uL    Comment: Immature Platelet Fraction may be clinically indicated, consider ordering this additional test [id number] CONSISTENT WITH PREVIOUS RESULT    nRBC 0.3 (H) 0.0 - 0.2 %    Comment: Performed at [facility], [place] [address] [place], [place] [address]  Comprehensive metabolic panel     Status: Abnormal   Collection Time: 05/21/21  1:13 AM  Result Value Ref Range   Sodium 139 135 - 145 mmol/L   Potassium 3.1 (L) 3.5 - 5.1 mmol/L   Chloride 106 98 - 111 mmol/L   CO2 19 (L) 22 - 32 mmol/L   Glucose, Bld 93 70 - 99 mg/dL    Comment: Glucose reference range applies only to samples taken after fasting for at  least 8 hours.   BUN 17 8 - 23 mg/dL   Creatinine, Ser 0.91 0.44 - 1.00 mg/dL   Calcium 7.9 (L) 8.9 - 10.3 mg/dL   Total Protein 5.1 (L) 6.5 - 8.1 g/dL   Albumin 2.7 (L) 3.5 - 5.0 g/dL   AST 118 (H) 15 - 41 U/L   ALT 57 (H) 0 - 44 U/L    Alkaline Phosphatase 57 38 - 126 U/L   Total Bilirubin 0.8 0.3 - 1.2 mg/dL   GFR, Estimated >60 >60 mL/min    Comment: (NOTE) Calculated using the CKD-EPI Creatinine Equation (2021)    Anion gap 14 5 - 15    Comment: Performed at [facility], [place] [address], [place], [place] [address]  CK     Status: Abnormal   Collection Time: 05/21/21  1:13 AM  Result Value Ref Range   Total CK 1,886 (H) 38 - 234 U/L    Comment: Performed at [facility], [place] [address] [place], [place] [address]  Glucose, capillary     Status: None   Collection Time: 05/21/21  2:17 AM  Result Value Ref Range   Glucose-Capillary 72 70 - 99 mg/dL    Comment: Glucose reference range applies only to samples taken after fasting for at least 8 hours.  Glucose, capillary     Status: Abnormal   Collection Time: 05/21/21  4:38 AM  Result Value Ref Range   Glucose-Capillary 101 (H) 70 - 99 mg/dL    Comment: Glucose reference range applies only to samples taken after fasting for at least 8 hours.  Glucose, capillary     Status: None   Collection Time: 05/21/21  5:44 AM  Result Value Ref Range   Glucose-Capillary 95 70 - 99 mg/dL    Comment: Glucose reference range applies only to samples taken after fasting for at least 8 hours.  Glucose, capillary     Status: None   Collection Time: 05/21/21  7:44 AM  Result Value Ref Range   Glucose-Capillary 83 70 - 99 mg/dL    Comment: Glucose reference range applies only to samples taken after fasting for at least 8 hours.  Glucose, capillary     Status: None   Collection Time: 05/21/21  9:45 AM  Result Value Ref Range   Glucose-Capillary 83 70 - 99 mg/dL    Comment: Glucose reference range applies only to samples taken after fasting for at least 8 hours.    Current Facility-Administered Medications  Medication Dose Route Frequency Provider Last Rate Last Admin   0.9 %  sodium chloride infusion   Intravenous Continuous Dagar, Meredith Staggers, MD       acetaminophen  (TYLENOL) tablet 650 mg  650 mg Oral Q8H PRN Dagar, Meredith Staggers, MD       bacitracin ointment   Topical BID McDiarmid, Blane Ohara, MD   Given at 05/20/21 2154   calcium-vitamin D (OSCAL WITH D) 500-200 MG-UNIT per tablet 1 tablet  1 tablet Oral Q breakfast Maness, Philip, MD   1 tablet at 05/21/21 0935   cycloSPORINE (RESTASIS) 0.05 % ophthalmic emulsion 1 drop  1 drop Both Eyes BID Maness, Arnette Norris, MD   1 drop at 05/20/21 2153   escitalopram (LEXAPRO) tablet 10 mg  10 mg Oral q AM Maness, Arnette Norris, MD   10 mg at 05/21/21 0936   feeding supplement (ENSURE ENLIVE / ENSURE PLUS) liquid 237 mL  237 mL Oral BID BM McDiarmid, Blane Ohara, MD  237 mL at 40/97/35 3299   folic acid (FOLVITE) tablet 1 mg  1 mg Oral Daily Maness, Philip, MD   1 mg at 05/21/21 2426   lactated ringers infusion   Intravenous Continuous Long, Wonda Olds, MD 125 mL/hr at 05/20/21 0859 New Bag at 05/20/21 0859   lidocaine (LIDODERM) 5 % 1 patch  1 patch Transdermal BID PRN Dagar, Meredith Staggers, MD   1 patch at 05/20/21 1140   loperamide (IMODIUM) capsule 2 mg  2 mg Oral PRN Dagar, Meredith Staggers, MD   2 mg at 05/21/21 0932   losartan (COZAAR) tablet 12.5 mg  12.5 mg Oral Daily Kathyrn Drown D, NP   12.5 mg at 05/21/21 8341   metoprolol succinate (TOPROL-XL) 24 hr tablet 100 mg  100 mg Oral q AM Autry-Lott, Simone, DO   100 mg at 05/21/21 9622   multivitamin with minerals tablet 1 tablet  1 tablet Oral Daily Autry-Lott, Simone, DO   1 tablet at 05/21/21 2979   thiamine tablet 100 mg  100 mg Oral q AM Maness, Philip, MD   100 mg at 05/21/21 0935   vitamin B-12 (CYANOCOBALAMIN) tablet 1,000 mcg  1,000 mcg Oral Daily McDiarmid, Blane Ohara, MD   1,000 mcg at 05/21/21 8921    Musculoskeletal: Strength & Muscle Tone: within normal limits Gait & Station:  remains in bed on exam Patient leans: N/A            Psychiatric Specialty Exam:  Presentation  General Appearance:  Appropriate for Environment Eye Contact: Good Speech: Clear and Coherent Speech  Volume: Normal Handedness: No data recorded  Mood and Affect  Mood: Irritable Affect: Congruent  Thought Process  Thought Processes: Coherent Descriptions of Associations:Intact Orientation:Full (Time, Place and Person) Thought Content:Illogical (patient refuses to answer certain questions if she does not like them) History of Schizophrenia/Schizoaffective disorder:No data recorded Duration of Psychotic Symptoms:No data recorded Hallucinations:Hallucinations: None Ideas of Reference:None Suicidal Thoughts:Suicidal Thoughts: No Homicidal Thoughts:Homicidal Thoughts: No  Sensorium  Memory: Immediate Poor; Recent Poor; Remote Fair Judgment: Impaired Insight: None  Executive Functions  Concentration: Fair Attention Span: Fair Recall: Poor Fund of Knowledge: Good Language: Good  Psychomotor Activity  Psychomotor Activity: Psychomotor Activity: Normal  Assets  Assets: Communication Skills; Desire for Improvement; Housing; Social Support; Resilience  Sleep  Sleep: Sleep: Fair  Physical Exam: Physical Exam Constitutional:      Appearance: Normal appearance.  HENT:     Head: Normocephalic and atraumatic.  Eyes:     Extraocular Movements: Extraocular movements intact.     Conjunctiva/sclera: Conjunctivae normal.  Cardiovascular:     Rate and Rhythm: Normal rate.  Pulmonary:     Effort: Pulmonary effort is normal.     Breath sounds: Normal breath sounds.  Abdominal:     General: Abdomen is flat.  Musculoskeletal:        General: Normal range of motion.  Skin:    General: Skin is warm and dry.     Findings: Bruising present.  Neurological:     General: No focal deficit present.     Mental Status: She is alert.   Review of Systems  Constitutional:  Negative for chills and fever.  HENT:  Negative for hearing loss.   Eyes:  Negative for blurred vision.  Respiratory:  Negative for cough and wheezing.   Cardiovascular:  Negative for chest pain.   Gastrointestinal:  Negative for abdominal pain.  Neurological:  Negative for dizziness.  Psychiatric/Behavioral:  Positive for memory loss. Negative for  hallucinations and suicidal ideas.   Blood pressure 133/79, pulse 84, temperature 98.5 F (36.9 C), temperature source Oral, resp. rate 18, height 5' (1.524 m), weight 45 kg, SpO2 92 %. Body mass index is 19.38 kg/m.  Treatment Plan Summary: Daily contact with patient to assess and evaluate symptoms and progress in treatment Capacity eval. Overall patient does not appear to have capacity to make a decision. Patient display a limited understanding of her problem but appears to have NO understanding of the proposed treatments or concerns that have been discussed with her via family and medical team. Patient appears to shut down when these concerns are brought up therefore she is not the position to weigh her options because patient does not appear to understand that she has choices, furthermore patient is not currently capable of understanding the consequences of each choice. Instead patient has an element of denial and is mentally in place where she is perseverating on "I don't want to leave my home" even when her daughter provided an option that allowed patient to remain in the home, she did not appear to recognize she had choices. Patient is unable to apply said choices to her life and evaluate them. Overall patient displays illogical thinking because she has NO reasoning behind her words.   Patient would benefit in assistance with medical decision making.  Concern for Adjustment Disorder w/ depressed mood During exam patient made it very clear that she is struggling with her progressive loss of autonomy and aging. Patient noted that a lot of her friends have died. Patient also noted that once the Covid Pandemic started she lost a lot of her social activities and she is much less motivated. Patient attributed her lack of motivated to part of why  she does not keep her home clean. Patient's daughter also noted that patient has been less energetic. Patient would often state she just "did not care" about certain things anymore. However because the pandemic has been ongoing patient's mood has been low for over 2 years.  - Will reassess in AM, patient will think about starting antidepressant  Disposition:  per primary team.  PGY-1 Freida Busman, MD 05/21/2021 10:49 AM

## 2021-05-21 NOTE — TOC Progression Note (Signed)
Transition of Care [facility]) - Progression Note    Patient Details  Name: [name] MRN: [id number] Date of Birth: 1940-12-20  Transition of [facility] CM/SW [place], [place] Phone Number: 05/21/2021, 1:17 PM  Clinical Narrative:     CSW met with pt for CAGE-AID. After CAGE-AID was completed, CSW spoke with pt about SNF for rehab at daughters request as they report that pt often responds better to males. Daughters were present for discussion. CSW explained the process and reasons for SNF recommendation. Pt dislikes idea of going to SNF and attempts to discuss alternative such as only going to SNF during the day. CSW is able to answer some of pt's questions, clarify what SNF care provides, and discuss pros/cons. Pt indicates willingness to consider SNF. She reports she would only want a facility in [place]. TOC will continue to follow.   Expected Discharge Plan: [place] Barriers to Discharge: Continued Medical Work up  Expected Discharge Plan and Services Expected Discharge Plan: [place] arrangements for the past 2 months: Single Family Home                                       Social Determinants of Health (SDOH) Interventions    Readmission Risk Interventions No flowsheet data found.

## 2021-05-21 NOTE — Progress Notes (Signed)
Initial Nutrition Assessment  DOCUMENTATION CODES:   Non-severe (moderate) malnutrition in context of chronic illness  INTERVENTION:   -D/c Ensure Enlive po BID, each supplement provides 350 kcal and 20 grams of protein  -Boost Breeze po TID, each supplement provides 250 kcal and 9 grams of protein  -30 ml Prosource Plus BID, each supplement provides 100 kcals and 15 grams protein -MVI with minerals daily  NUTRITION DIAGNOSIS:   Moderate Malnutrition related to chronic illness (liver disease, ETOH abuse) as evidenced by mild fat depletion, moderate fat depletion, mild muscle depletion, moderate muscle depletion.  GOAL:   Patient will meet greater than or equal to 90% of their needs  MONITOR:   PO intake, Supplement acceptance, Labs, Weight trends, Skin, I & O's  REASON FOR ASSESSMENT:   Consult Assessment of nutrition requirement/status, Wound healing, Calorie Count, Poor PO  ASSESSMENT:   [name] is a 80 y.o. female presenting with Fall. 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  Pt admitted with dehydration, hyperkalemia and s/p fall.   Reviewed I/O's: +912 ml x 24 hours and +4.9 L since admission  UOP: 0 ml x 24 hours  Reviewed CWOCN note, pt with BLE wounds (abrasions and lacerations).   Spoke with pt and two daughters [name] and Bernice) at bedside. Per pt, her appetite has improved since admission. She consumed about half of a container of [place] Yogurt for breakfast. Noted meal completion 25-50%.  Per pt daughters, intake is usually minimal. Favorite foods include coleslaw, Greek yogurt, sausage biscuits, and vegetables. Daughters report that pt has a large amount of food in her home and goes grocery shopping daily, but often eats only vegetables. Protein intake is  mainly [place] yogurt and sausage. Pt also consumes water and 2 cartons of wine daily.   Pt wt is usually 90# and family is pleased with recent wt gain. Pt has weighed as low as 90# "when things get really bad".   Pt dislikes taste of Ensure supplements. RD reviewed other supplements on formulary and pt agreed to sample all 3 flavors of Boost Breeze (likes peach the best). Discussed importance of good meal and supplement intake to promote healing. Pt very emotional at time of visit, stating "I'm sick and tired of having people care for me". RD provided emotional support and assured her that the care team was here to help her feel better and more independent.   Medications reviewed and include calcium with vitamin D, folvite, thiamine, vitamin B-12, and 0.9% sodium chloride infusion @ 125 ml/hr.   Labs reviewed: CBGS: 72-101 (inpatient orders for glycemic control are none).    NUTRITION - FOCUSED PHYSICAL EXAM:  Flowsheet Row Most Recent Value  Orbital Region Mild depletion  Upper Arm Region Moderate depletion  Thoracic and Lumbar Region Mild depletion  Buccal Region Mild depletion  Temple Region Moderate depletion  Clavicle Bone Region Mild depletion  Clavicle and Acromion Bone Region Moderate depletion  Scapular Bone Region Moderate depletion  Dorsal Hand Moderate depletion  Patellar Region Moderate depletion  Anterior Thigh Region Moderate depletion  Posterior Calf Region Moderate depletion  Edema (RD Assessment) None  Hair Reviewed  Eyes Reviewed  Mouth Reviewed  Skin Reviewed  Nails Reviewed       Diet Order:   Diet Order             Diet regular  Room service appropriate? Yes; Fluid consistency: Thin  Diet effective now                   EDUCATION NEEDS:   Education needs have been addressed  Skin:  Skin Assessment: Skin Integrity Issues: Skin Integrity Issues:: Other (Comment) Other: skin tears to bilateral knees and rt elbow  Last BM:  05/21/21  Height:    Ht Readings from Last 1 Encounters:  05/18/21 5' (1.524 m)    Weight:   Wt Readings from Last 1 Encounters:  05/18/21 45 kg    Ideal Body Weight:  45.5 kg  BMI:  Body mass index is 19.38 kg/m.  Estimated Nutritional Needs:   Kcal:  1550-1750  Protein:  75-90 grams  Fluid:  > 1.5 L    Loistine Chance, RD, LDN, [place] Registered Dietitian II Certified Diabetes Care and Education Specialist Please refer to [facility] for RD and/or RD on-call/weekend/after hours pager

## 2021-05-22 ENCOUNTER — Encounter (HOSPITAL_COMMUNITY): Payer: Self-pay | Admitting: Family Medicine

## 2021-05-22 DIAGNOSIS — I5023 Acute on chronic systolic (congestive) heart failure: Secondary | ICD-10-CM

## 2021-05-22 DIAGNOSIS — I340 Nonrheumatic mitral (valve) insufficiency: Secondary | ICD-10-CM

## 2021-05-22 DIAGNOSIS — E440 Moderate protein-calorie malnutrition: Secondary | ICD-10-CM | POA: Insufficient documentation

## 2021-05-22 DIAGNOSIS — I426 Alcoholic cardiomyopathy: Secondary | ICD-10-CM

## 2021-05-22 DIAGNOSIS — R931 Abnormal findings on diagnostic imaging of heart and coronary circulation: Secondary | ICD-10-CM | POA: Diagnosis present

## 2021-05-22 DIAGNOSIS — E860 Dehydration: Secondary | ICD-10-CM | POA: Diagnosis not present

## 2021-05-22 DIAGNOSIS — N183 Chronic kidney disease, stage 3 unspecified: Secondary | ICD-10-CM | POA: Diagnosis not present

## 2021-05-22 DIAGNOSIS — I509 Heart failure, unspecified: Secondary | ICD-10-CM | POA: Diagnosis not present

## 2021-05-22 HISTORY — DX: Nonrheumatic mitral (valve) insufficiency: I34.0

## 2021-05-22 LAB — BASIC METABOLIC PANEL
Anion gap: 11 (ref 5–15)
BUN: 13 mg/dL (ref 8–23)
CO2: 23 mmol/L (ref 22–32)
Calcium: 8.2 mg/dL — ABNORMAL LOW (ref 8.9–10.3)
Chloride: 106 mmol/L (ref 98–111)
Creatinine, Ser: 0.89 mg/dL (ref 0.44–1.00)
GFR, Estimated: >60 mL/min (ref >60)
Glucose, Bld: 85 mg/dL (ref 70–99)
Potassium: 3.5 mmol/L (ref 3.5–5.1)
Sodium: 140 mmol/L (ref 135–145)

## 2021-05-22 LAB — CBC
HCT: 33.8 % — ABNORMAL LOW (ref 36.0–46.0)
HCT: 34.5 % — ABNORMAL LOW (ref 36.0–46.0)
Hemoglobin: 11.1 g/dL — ABNORMAL LOW (ref 12.0–15.0)
Hemoglobin: 11.2 g/dL — ABNORMAL LOW (ref 12.0–15.0)
MCH: 32.6 pg (ref 26.0–34.0)
MCH: 32.7 pg (ref 26.0–34.0)
MCHC: 32.8 g/dL (ref 30.0–36.0)
MCHC: 32.5 g/dL (ref 30.0–36.0)
MCV: 99.4 fL (ref 80.0–100.0)
MCV: 100.6 fL — ABNORMAL HIGH (ref 80.0–100.0)
Platelets: 81 10*3/uL — ABNORMAL LOW (ref 150–400)
Platelets: 81 10*3/uL — ABNORMAL LOW (ref 150–400)
RBC: 3.4 MIL/uL — ABNORMAL LOW (ref 3.87–5.11)
RBC: 3.43 MIL/uL — ABNORMAL LOW (ref 3.87–5.11)
RDW: 15.1 % (ref 11.5–15.5)
RDW: 15.2 % (ref 11.5–15.5)
WBC: 7.3 10*3/uL (ref 4.0–10.5)
WBC: 8.1 10*3/uL (ref 4.0–10.5)
nRBC: 0.4 % — ABNORMAL HIGH (ref 0.0–0.2)
nRBC: 0.4 % — ABNORMAL HIGH (ref 0.0–0.2)

## 2021-05-22 LAB — GLUCOSE, CAPILLARY
Glucose-Capillary: 122 mg/dL — ABNORMAL HIGH (ref 70–99)
Glucose-Capillary: 67 mg/dL — ABNORMAL LOW (ref 70–99)
Glucose-Capillary: 139 mg/dL — ABNORMAL HIGH (ref 70–99)

## 2021-05-22 LAB — CK: Total CK: 1153 U/L — ABNORMAL HIGH (ref 38–234)

## 2021-05-22 MED ORDER — ESCITALOPRAM OXALATE 20 MG PO TABS
20.0000 mg | ORAL_TABLET | Freq: Every morning | ORAL | Status: DC
  Administered 2021-05-23 – 2021-05-24 (×2): 20 mg via ORAL
  Filled 2021-05-22 (×2): qty 1

## 2021-05-22 MED ORDER — FUROSEMIDE 10 MG/ML IJ SOLN
20.0000 mg | Freq: Once | INTRAMUSCULAR | Status: AC
  Administered 2021-05-22: 20 mg via INTRAVENOUS
  Filled 2021-05-22: qty 2

## 2021-05-22 MED ORDER — POTASSIUM CHLORIDE 20 MEQ PO PACK
40.0000 meq | PACK | Freq: Once | ORAL | Status: AC
  Administered 2021-05-22: 40 meq via ORAL
  Filled 2021-05-22: qty 2

## 2021-05-22 MED ORDER — LOSARTAN POTASSIUM 25 MG PO TABS
25.0000 mg | ORAL_TABLET | Freq: Every day | ORAL | Status: DC
  Administered 2021-05-22 – 2021-05-24 (×3): 25 mg via ORAL
  Filled 2021-05-22 (×3): qty 1

## 2021-05-22 NOTE — Progress Notes (Signed)
Hypoglycemic Event  CBG: 66  Treatment: 8 oz juice/soda  Symptoms: None  Follow-up CBG: [id number] CBG Result:122  Possible Reasons for Event: Inadequate meal intake  Comments/MD notified:na    Vidal Schwalbe

## 2021-05-22 NOTE — Progress Notes (Signed)
Occupational Therapy Treatment Patient Details Name: [name] MRN: [id number] DOB: 02-15-1941 Today's Date: 05/22/2021    History of present illness [name] is a 80 y.o. 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   OT comments  [name] is progressing towards her goals. Upon arrival pt requesting to use BSC with NT. Pt completed bed mobility with mod/max A and verbal cues for hand placement. Pt was min A+2 for sit<>stand and small shuffling steps to [facility]. Throughout transfers, pt refusing physical assistance which caused several safety concerns. Pt only allowed hand hold assist with extreme posterior lean and resistance; pt educated on safe transfers and OOB mobility however continues to be resistant. Total A for rear pericare at bed level, pt refused to participate. Pt's daughter, [name], reported this is a normal state of agitation for her mother. Pt continues to benefit from skilled OT as she allows. D/c rec remains appropriate.    Follow Up Recommendations  SNF;Supervision/Assistance - 24 hour    Equipment Recommendations  Other (comment)       Precautions / Restrictions Precautions Precautions: Fall Restrictions Weight Bearing Restrictions: No       Mobility Bed Mobility Overal bed mobility: Needs Assistance Bed Mobility: Supine to Sit;Sit to Supine     Supine to sit: Mod assist Sit to supine: Max assist   General bed mobility comments: assist levels for BLE management    Transfers Overall transfer level: Needs assistance Equipment used: 2 person hand held assist Transfers: Sit to/from Stand Sit to Stand: Min assist;+2 safety/equipment         General  transfer comment: mid A +2 for boostign and balance in standing, pt refuses for more than hand hold assist therefore +2 is necessaty fro safety    Balance Overall balance assessment: Needs assistance Sitting-balance support: Feet supported;Bilateral upper extremity supported Sitting balance-Leahy Scale: Fair     Standing balance support: Bilateral upper extremity supported Standing balance-Leahy Scale: Good           ADL either performed or assessed with clinical judgement   ADL Overall ADL's : Needs assistance/impaired                         Toilet Transfer: Minimal assistance;+2 for safety/equipment;Comfort height toilet Toilet Transfer Details (indicate cue type and reason): pt +2 for safety for stand pivot - pt extremely agitated with any assistance given creating safety issues Toileting- Clothing Manipulation and Hygiene: Total assistance;Bed level Toileting - Clothing Manipulation Details (indicate cue type and reason): pt refused hygiene while standing and refused to assist therefore total A for rear hygiene     Functional mobility during ADLs: Minimal assistance;+2 for safety/equipment;Cueing for safety General ADL Comments: Pt refused to participate in ADLs beyond rear hygiene      Cognition Arousal/Alertness: Awake/alert Behavior During Therapy: Agitated Overall Cognitive Status: Impaired/Different from baseline Area of Impairment: Orientation;Attention;Memory;Safety/judgement;Awareness                 Orientation Level: Disoriented to;Place;Time;Situation Current Attention Level: Sustained Memory: Decreased short-term memory   Safety/Judgement: Decreased awareness of safety;Decreased awareness of deficits Awareness: Intellectual   General Comments: Pt extremely agitated this session;, refusing physical help  General Comments Pt with multiple bruises and skin tears throughout body - noted bleeding on LLE through compression  stocking    Pertinent Vitals/ Pain       Pain Assessment: Faces Faces Pain Scale: Hurts even more Pain Location: specifically L lower ribs; otherwise also generalized pain with movement Pain Descriptors / Indicators: Aching;Grimacing;Guarding Pain Intervention(s): Monitored during session  Frequency  Min 2X/week        Progress Toward Goals  OT Goals(current goals can now be found in the care plan section)  Progress towards OT goals: Progressing toward goals  Acute Rehab OT Goals Patient Stated Goal: to get home OT Goal Formulation: With patient/family Time For Goal Achievement: 06/03/21 Potential to Achieve Goals: Good ADL Goals Pt Will Perform Eating: with set-up;with supervision;sitting Pt Will Perform Grooming: with min assist;sitting;with min guard assist Pt Will Perform Upper Body Bathing: with max assist;with mod assist;sitting Pt Will Perform Lower Body Bathing: with max assist;with mod assist;sitting/lateral leans Pt Will Perform Upper Body Dressing: with max assist;with mod assist;sitting Pt Will Transfer to Toilet: with max assist;with mod assist;stand pivot transfer;bedside commode Pt Will Perform Toileting - Clothing Manipulation and hygiene: with max assist;with mod assist;sit to/from stand  Plan Discharge plan remains appropriate       AM-PAC OT "6 Clicks" Daily Activity     Outcome Measure   Help from another person eating meals?: A Little Help from another person taking care of personal grooming?: A Lot Help from another person toileting, which includes using toliet, bedpan, or urinal?: A Lot Help from another person bathing (including washing, rinsing, drying)?: A Lot Help from another person to put on and taking off regular upper body clothing?: A Little Help from another person to put on and taking off regular lower body clothing?: A Lot 6 Click Score: 14    End of Session Equipment Utilized During Treatment: Gait belt;Other (comment) (BSC)  OT  Visit Diagnosis: Other abnormalities of gait and mobility (R26.89);History of falling (Z91.81);Muscle weakness (generalized) (M62.81);Pain;Other symptoms and signs involving cognitive function Pain - Right/Left: Left   Activity Tolerance Treatment limited secondary to agitation   Patient Left in bed;with call bell/phone within reach;with family/visitor present   Nurse Communication Mobility status        Time: 3143-8887 OT Time Calculation (min): 35 min  Charges: OT General Charges $OT Visit: 1 Visit OT Treatments $Self Care/Home Management : 23-37 mins    Chevez Sambrano A Madissen Wyse 05/22/2021, 12:20 PM

## 2021-05-22 NOTE — Consult Note (Signed)
[facility] Consult   Reason for Consult:  Capacity evaluation Referring Physician:  Lissa Morales, MD Patient Identification: [name] MRN:  [id number] Principal Diagnosis: Dehydration Diagnosis:  Principal Problem:   Dehydration Active Problems:   Fall   Myocardial injury   Generalized weakness   Rhabdomyolysis   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HCC)   Atherosclerosis of aorta (HCC)   Multiple skin tears   Multiple Bruising sites   Poor historian   Acute renal injury ([place])   Transaminitis   Bilirubinemia   Hypoproteinemia (HCC)   Elevated troponin   Vitamin B12 deficiency   Macrocytic anemia   Thrombocytopenia (HCC)   TSH elevation   Pyuria   Hematuria   Cerebral atrophy (HCC)   Degenerative disc disease, cervical   Bilateral Periorbital ecchymosis   Elevated CK   Multiple closed fractures of ribs of left side   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 female patient admitted after a fall ans subsequently found to have rhabdo, thrombocytopenia w/ macrocytic anemia 2/2 EtoH use disorder, AKI on CKD 3,elevated Trp 2/2 rhabdo, and HFrEF. Patient has a PMH of anxiety, arthritis, alcohol use disorder, carcinoid bronchial adenoma of right lung, depression, GERD, kidney stones, liver disease, osteoporosis, subarachnoid hemorrhage, macrocytic anemia and thrombocytopenia. During stay patient was noted to be very agitated and there has been question if patient has the capacity to make her own decisions.   HPI:  On assessment this AM patient's other daughter, [name] is in the room. Patient reports she is doing ok this AM but admits she is having a hard time and begins  to talk about her children no longer allowing her to drive. Patient also brings up the lack of activities and friends that she has. When Provider brings up antidepressant medication patient acknowledges that her daughter will need to assist with this decision. Patient's daughter reports patietn is already on Lexapro but shows Provider that patient will often to forget to take her medications even though they are already pre packaged. Provider and patient discuss patient having someone help her with this and patient admits that she does not want people to come into her home because she is embarrassed by how messy her home has become.  Patient reports she will speak with her daughter more about this and also finding activities for her as she realizes she does need to make some changes. Patient denies SI, HI, and AVH.   Past Psychiatric History: Depression   Risk to Self:  NO Risk to Others:  NO Prior Inpatient Therapy:  NO Prior Outpatient Therapy:  NO  Past Medical History:  Past Medical History:  Diagnosis Date   Abdominal wall hernia, periumbilical, fat-containing 05/19/2021   CT [facility] 05/18/21: Fat containing periumbilical anterior abdominal wall hernia.  Second fat and bowel containing infraumbilical anterior abdominal wall hernia. No CT evidence of bowel incarceration.   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   Macrocytic anemia 05/19/2021   Mitral valve regurgitation, moderate 05/22/2021   Multiple closed fractures of ribs of left side 05/18/2021   Osteoporosis    Treated with  denosumab   Physical deconditioning    Recurrent falls    Recurrent kidney stones    Calcium oxalate monohydrate on stone analysis 05/2020 at [facility] urology   Seizure [facility])    started on Keppra after [facility]   Subarachnoid hemorrhage ([place]) 12/19/2019    Past Surgical History:  Procedure Laterality Date   ABDOMINAL HYSTERECTOMY     Vaginal   APPENDECTOMY     CARPAL TUNNEL RELEASE Right 01/02/2012   Dr Fredna Dow ([facility])   CYSTOSCOPY W/ RETROGRADES Bilateral 02/28/2018   Procedure: CYSTOSCOPY WITH BILATERAL RETROGRADE PYELOGRAM;BILATERAL URETERAL STENT PLACEMENT;  Surgeon: Cleon Gustin, MD;  Location: [facility];  Service: Urology;  Laterality: Bilateral;   SMALL INTESTINE SURGERY  2004   Iliectomy   THORACOTOMY  12/01/2005   VATS Right Upper Lung Lobe - Dr Arlyce Dice ([facility])   TONSILLECTOMY     Family History: History reviewed. No pertinent family history. Family Psychiatric  History: None known Social History:  Social History   Substance and Sexual Activity  Alcohol Use None     Social History   Substance and Sexual Activity  Drug Use Not on file    Social History   Socioeconomic History   Marital status: Widowed    Spouse name: Not on file   Number of children: Not on file   Years of education: Not on file   Highest education level: Not on file  Occupational History   Not on file  Tobacco Use   Smoking status: Not on file   Smokeless tobacco: Not on file  Substance and Sexual Activity   Alcohol use: Not on file   Drug use: Not on file   Sexual activity: Not on file  Other Topics Concern   Not on file  Social History Narrative   Not on file   Social Determinants of Health   Financial Resource Strain: Not on file  Food Insecurity: Not on file  Transportation Needs: Not on file  Physical Activity: Not on file  Stress: Not on file  Social Connections: Not on file   Additional Social History:    Allergies:   Allergies  Allergen Reactions    Clindamycin/Lincomycin Rash   Doxycycline Rash    Labs:  Results for orders placed or performed during the hospital encounter of 05/18/21 (from the past 48 hour(s))  Glucose, capillary     Status: Abnormal   Collection Time: 05/20/21 11:39 AM  Result Value Ref Range   Glucose-Capillary 102 (H) 70 - 99 mg/dL    Comment: Glucose reference range applies only to samples taken after fasting for at least 8 hours.  Troponin I (High Sensitivity)     Status: Abnormal   Collection Time: 05/20/21  1:03 PM  Result Value Ref Range   Troponin I (High Sensitivity) 814 (HH) <18 ng/L    Comment: CRITICAL VALUE NOTED.  VALUE IS CONSISTENT WITH PREVIOUSLY REPORTED AND CALLED VALUE. (NOTE) Elevated high sensitivity troponin I (hsTnI) values and significant  changes across serial measurements may suggest ACS but many other  chronic and acute conditions are known to elevate hsTnI results.  Refer to the Links section for chest pain algorithms  and additional  guidance. Performed at [facility], [place] [address] [place], [place] [address]   Glucose, capillary     Status: None   Collection Time: 05/20/21  2:38 PM  Result Value Ref Range   Glucose-Capillary 86 70 - 99 mg/dL    Comment: Glucose reference range applies only to samples taken after fasting for at least 8 hours.  Glucose, capillary     Status: None   Collection Time: 05/20/21  4:42 PM  Result Value Ref Range   Glucose-Capillary 81 70 - 99 mg/dL    Comment: Glucose reference range applies only to samples taken after fasting for at least 8 hours.  Glucose, capillary     Status: Abnormal   Collection Time: 05/20/21  7:52 PM  Result Value Ref Range   Glucose-Capillary 103 (H) 70 - 99 mg/dL    Comment: Glucose reference range applies only to samples taken after fasting for at least 8 hours.  Glucose, capillary     Status: None   Collection Time: 05/20/21  9:53 PM  Result Value Ref Range   Glucose-Capillary 92 70 - 99 mg/dL    Comment:  Glucose reference range applies only to samples taken after fasting for at least 8 hours.  Glucose, capillary     Status: None   Collection Time: 05/21/21 12:10 AM  Result Value Ref Range   Glucose-Capillary 83 70 - 99 mg/dL    Comment: Glucose reference range applies only to samples taken after fasting for at least 8 hours.  CBC     Status: Abnormal   Collection Time: 05/21/21  1:13 AM  Result Value Ref Range   WBC 7.6 4.0 - 10.5 K/uL   RBC 3.12 (L) 3.87 - 5.11 MIL/uL   Hemoglobin 10.2 (L) 12.0 - 15.0 g/dL   HCT 31.0 (L) 36.0 - 46.0 %   MCV 99.4 80.0 - 100.0 fL   MCH 32.7 26.0 - 34.0 pg   MCHC 32.9 30.0 - 36.0 g/dL   RDW 14.9 11.5 - 15.5 %   Platelets 70 (L) 150 - 400 K/uL    Comment: Immature Platelet Fraction may be clinically indicated, consider ordering this additional test [id number] CONSISTENT WITH PREVIOUS RESULT    nRBC 0.3 (H) 0.0 - 0.2 %    Comment: Performed at [facility], [place] [address] [place], [place] [address]  Comprehensive metabolic panel     Status: Abnormal   Collection Time: 05/21/21  1:13 AM  Result Value Ref Range   Sodium 139 135 - 145 mmol/L   Potassium 3.1 (L) 3.5 - 5.1 mmol/L   Chloride 106 98 - 111 mmol/L   CO2 19 (L) 22 - 32 mmol/L   Glucose, Bld 93 70 - 99 mg/dL    Comment: Glucose reference range applies only to samples taken after fasting for at least 8 hours.   BUN 17 8 - 23 mg/dL   Creatinine, Ser 0.91 0.44 - 1.00 mg/dL   Calcium 7.9 (L) 8.9 - 10.3 mg/dL   Total Protein 5.1 (L) 6.5 - 8.1 g/dL   Albumin 2.7 (L) 3.5 - 5.0 g/dL   AST 118 (H) 15 - 41 U/L   ALT 57 (H) 0 - 44 U/L   Alkaline Phosphatase 57 38 - 126 U/L   Total Bilirubin 0.8 0.3 - 1.2 mg/dL   GFR, Estimated >60 >60 mL/min    Comment: (NOTE) Calculated using the CKD-EPI Creatinine Equation (2021)    Anion gap 14  5 - 15    Comment: Performed at [facility], [place] [address], [place], [place] [address]  CK     Status: Abnormal   Collection Time: 05/21/21  1:13 AM   Result Value Ref Range   Total CK 1,886 (H) 38 - 234 U/L    Comment: Performed at [facility], [place] [address], [place], [place] [address]  Glucose, capillary     Status: None   Collection Time: 05/21/21  2:17 AM  Result Value Ref Range   Glucose-Capillary 72 70 - 99 mg/dL    Comment: Glucose reference range applies only to samples taken after fasting for at least 8 hours.  Glucose, capillary     Status: Abnormal   Collection Time: 05/21/21  4:38 AM  Result Value Ref Range   Glucose-Capillary 101 (H) 70 - 99 mg/dL    Comment: Glucose reference range applies only to samples taken after fasting for at least 8 hours.  Glucose, capillary     Status: None   Collection Time: 05/21/21  5:44 AM  Result Value Ref Range   Glucose-Capillary 95 70 - 99 mg/dL    Comment: Glucose reference range applies only to samples taken after fasting for at least 8 hours.  Glucose, capillary     Status: None   Collection Time: 05/21/21  7:44 AM  Result Value Ref Range   Glucose-Capillary 83 70 - 99 mg/dL    Comment: Glucose reference range applies only to samples taken after fasting for at least 8 hours.  Glucose, capillary     Status: None   Collection Time: 05/21/21  9:45 AM  Result Value Ref Range   Glucose-Capillary 83 70 - 99 mg/dL    Comment: Glucose reference range applies only to samples taken after fasting for at least 8 hours.  Glucose, capillary     Status: Abnormal   Collection Time: 05/21/21 12:40 PM  Result Value Ref Range   Glucose-Capillary 147 (H) 70 - 99 mg/dL    Comment: Glucose reference range applies only to samples taken after fasting for at least 8 hours.  Glucose, capillary     Status: Abnormal   Collection Time: 05/21/21  6:18 PM  Result Value Ref Range   Glucose-Capillary 107 (H) 70 - 99 mg/dL    Comment: Glucose reference range applies only to samples taken after fasting for at least 8 hours.  Troponin I (High Sensitivity)     Status: Abnormal   Collection Time:  05/21/21  9:44 PM  Result Value Ref Range   Troponin I (High Sensitivity) 731 (HH) <18 ng/L    Comment: CRITICAL VALUE NOTED.  VALUE IS CONSISTENT WITH PREVIOUSLY REPORTED AND CALLED VALUE. (NOTE) Elevated high sensitivity troponin I (hsTnI) values and significant  changes across serial measurements may suggest ACS but many other  chronic and acute conditions are known to elevate hsTnI results.  Refer to the Links section for chest pain algorithms and additional  guidance. Performed at [facility], [place] [address] [place], [place] [address]   Basic metabolic panel     Status: Abnormal   Collection Time: 05/21/21  9:44 PM  Result Value Ref Range   Sodium 137 135 - 145 mmol/L   Potassium 3.4 (L) 3.5 - 5.1 mmol/L   Chloride 105 98 - 111 mmol/L   CO2 22 22 - 32 mmol/L   Glucose, Bld 108 (H) 70 - 99 mg/dL    Comment: Glucose reference range applies  only to samples taken after fasting for at least 8 hours.   BUN 14 8 - 23 mg/dL   Creatinine, Ser 0.86 0.44 - 1.00 mg/dL   Calcium 8.3 (L) 8.9 - 10.3 mg/dL   GFR, Estimated >60 >60 mL/min    Comment: (NOTE) Calculated using the CKD-EPI Creatinine Equation (2021)    Anion gap 10 5 - 15    Comment: Performed at [place] [address], [place], [place] [address]  Glucose, capillary     Status: None   Collection Time: 05/21/21 11:34 PM  Result Value Ref Range   Glucose-Capillary 93 70 - 99 mg/dL    Comment: Glucose reference range applies only to samples taken after fasting for at least 8 hours.  CBC     Status: Abnormal   Collection Time: 05/22/21  2:49 AM  Result Value Ref Range   WBC 7.3 4.0 - 10.5 K/uL   RBC 3.40 (L) 3.87 - 5.11 MIL/uL   Hemoglobin 11.1 (L) 12.0 - 15.0 g/dL   HCT 33.8 (L) 36.0 - 46.0 %   MCV 99.4 80.0 - 100.0 fL   MCH 32.6 26.0 - 34.0 pg   MCHC 32.8 30.0 - 36.0 g/dL   RDW 15.1 11.5 - 15.5 %   Platelets 81 (L) 150 - 400 K/uL    Comment: Immature Platelet Fraction may be clinically indicated,  consider ordering this additional test [id number] CONSISTENT WITH PREVIOUS RESULT REPEATED TO VERIFY    nRBC 0.4 (H) 0.0 - 0.2 %    Comment: Performed at [facility], [place] [address] [place], [place] [address]  CK     Status: Abnormal   Collection Time: 05/22/21  2:49 AM  Result Value Ref Range   Total CK 1,153 (H) 38 - 234 U/L    Comment: Performed at [facility], [place] [address], [place], [place] [address]  Basic metabolic panel     Status: Abnormal   Collection Time: 05/22/21  2:49 AM  Result Value Ref Range   Sodium 140 135 - 145 mmol/L   Potassium 3.5 3.5 - 5.1 mmol/L   Chloride 106 98 - 111 mmol/L   CO2 23 22 - 32 mmol/L   Glucose, Bld 85 70 - 99 mg/dL    Comment: Glucose reference range applies only to samples taken after fasting for at least 8 hours.   BUN 13 8 - 23 mg/dL   Creatinine, Ser 0.89 0.44 - 1.00 mg/dL   Calcium 8.2 (L) 8.9 - 10.3 mg/dL   GFR, Estimated >60 >60 mL/min    Comment: (NOTE) Calculated using the CKD-EPI Creatinine Equation (2021)    Anion gap 11 5 - 15    Comment: Performed at [place] [address], [place], [place] [address]  Glucose, capillary     Status: Abnormal   Collection Time: 05/22/21  5:43 AM  Result Value Ref Range   Glucose-Capillary 67 (L) 70 - 99 mg/dL    Comment: Glucose reference range applies only to samples taken after fasting for at least 8 hours.  Glucose, capillary     Status: Abnormal   Collection Time: 05/22/21  6:19 AM  Result Value Ref Range   Glucose-Capillary 122 (H) 70 - 99 mg/dL    Comment: Glucose reference range applies only to samples taken after fasting for at least 8 hours.  CBC     Status: Abnormal   Collection Time: 05/22/21 10:45 AM  Result Value Ref  Range   WBC 8.1 4.0 - 10.5 K/uL   RBC 3.43 (L) 3.87 - 5.11 MIL/uL   Hemoglobin 11.2 (L) 12.0 - 15.0 g/dL   HCT 34.5 (L) 36.0 - 46.0 %   MCV 100.6 (H) 80.0 - 100.0 fL   MCH 32.7 26.0 - 34.0 pg   MCHC 32.5 30.0 - 36.0 g/dL   RDW  15.2 11.5 - 15.5 %   Platelets 81 (L) 150 - 400 K/uL    Comment: Immature Platelet Fraction may be clinically indicated, consider ordering this additional test [id number] CONSISTENT WITH PREVIOUS RESULT REPEATED TO VERIFY    nRBC 0.4 (H) 0.0 - 0.2 %    Comment: Performed at [facility], [place] [address] [place], [place] [address]    Current Facility-Administered Medications  Medication Dose Route Frequency Provider Last Rate Last Admin   (feeding supplement) PROSource Plus liquid 30 mL  30 mL Oral BID BM McDiarmid, Blane Ohara, MD   30 mL at 05/22/21 1038   acetaminophen (TYLENOL) tablet 650 mg  650 mg Oral Q8H PRN Dagar, Meredith Staggers, MD       bacitracin ointment   Topical BID McDiarmid, Blane Ohara, MD   Given by Other at 05/22/21 0600   calcium-vitamin D (OSCAL WITH D) 500-200 MG-UNIT per tablet 1 tablet  1 tablet Oral Q breakfast Maness, Philip, MD   1 tablet at 05/22/21 9417   cycloSPORINE (RESTASIS) 0.05 % ophthalmic emulsion 1 drop  1 drop Both Eyes BID Maness, Arnette Norris, MD   1 drop at 05/21/21 2129   escitalopram (LEXAPRO) tablet 10 mg  10 mg Oral q AM Maness, Arnette Norris, MD   10 mg at 05/22/21 1043   feeding supplement (BOOST / RESOURCE BREEZE) liquid 1 Container  1 Container Oral TID BM McDiarmid, Blane Ohara, MD   1 Container at 40/81/44 8185   folic acid (FOLVITE) tablet 1 mg  1 mg Oral Daily Maness, Philip, MD   1 mg at 05/22/21 1036   lidocaine (LIDODERM) 5 % 1 patch  1 patch Transdermal BID PRN Dagar, Meredith Staggers, MD   1 patch at 05/20/21 1140   loperamide (IMODIUM) capsule 2 mg  2 mg Oral PRN Dagar, Meredith Staggers, MD   2 mg at 05/22/21 1044   losartan (COZAAR) tablet 25 mg  25 mg Oral Daily Cherlynn Kaiser A, MD   25 mg at 05/22/21 1036   metoprolol succinate (TOPROL-XL) 24 hr tablet 100 mg  100 mg Oral q AM Autry-Lott, Simone, DO   100 mg at 05/22/21 1044   multivitamin with minerals tablet 1 tablet  1 tablet Oral Daily Autry-Lott, Simone, DO   1 tablet at 05/22/21 1036   thiamine tablet 100 mg  100 mg  Oral q AM Maness, Philip, MD   100 mg at 05/22/21 1043   vitamin B-12 (CYANOCOBALAMIN) tablet 1,000 mcg  1,000 mcg Oral Daily McDiarmid, Blane Ohara, MD   1,000 mcg at 05/22/21 1037    Musculoskeletal: Strength & Muscle Tone: decreased Gait & Station:  remains in bed on exam Patient leans: N/A            Psychiatric Specialty Exam:  Presentation  General Appearance: Appropriate for Environment  Eye Contact:Good  Speech:Clear and Coherent  Speech Volume:Normal  Handedness: No data recorded  Mood and Affect  Mood:Euthymic (but admits she is overall having a hard time with the changes in her life)  Affect:Appropriate   Thought Process  Thought Processes:Coherent  Descriptions of Associations:Intact  Orientation:Full (  Time, Place and Person)  Thought Content:Perseveration  History of Schizophrenia/Schizoaffective disorder:No data recorded Duration of Psychotic Symptoms:No data recorded Hallucinations:Hallucinations: None  Ideas of Reference:None  Suicidal Thoughts:Suicidal Thoughts: No  Homicidal Thoughts:Homicidal Thoughts: No   Sensorium  Memory:Immediate Fair; Remote Good; Recent Poor  Judgment:Impaired  Insight:Shallow   Executive Functions  Concentration:Good  Attention Span:Good  [place] of Knowledge:Good  Language:Good   Psychomotor Activity  Psychomotor Activity:Psychomotor Activity: Normal   Assets  Assets:Communication Skills; Desire for Improvement; Resilience; Social Support   Sleep  Sleep:Sleep: Good   Physical Exam: Physical Exam Constitutional:      Appearance: Normal appearance.  HENT:     Head: Normocephalic and atraumatic.  Eyes:     Extraocular Movements: Extraocular movements intact.     Conjunctiva/sclera: Conjunctivae normal.  Cardiovascular:     Rate and Rhythm: Normal rate.  Pulmonary:     Effort: Pulmonary effort is normal.     Breath sounds: Normal breath sounds.  Abdominal:      General: Abdomen is flat.  Musculoskeletal:        General: Normal range of motion.  Skin:    General: Skin is warm and dry.     Findings: Bruising present.  Neurological:     General: No focal deficit present.     Mental Status: She is alert.   Review of Systems  Constitutional:  Negative for chills and fever.  HENT:  Negative for hearing loss.   Eyes:  Negative for blurred vision.  Respiratory:  Negative for cough and wheezing.   Cardiovascular:  Negative for chest pain.  Gastrointestinal:  Negative for abdominal pain.  Neurological:  Negative for dizziness.  Psychiatric/Behavioral:  Positive for depression. Negative for suicidal ideas.   Blood pressure (!) 148/94, pulse 79, temperature 98.3 F (36.8 C), resp. rate 18, height 5' (1.524 m), weight 45 kg, SpO2 100 %. Body mass index is 19.38 kg/m.  Treatment Plan Summary: Daily contact with patient to assess and evaluate symptoms and progress in treatment Adjustment disorder w/ depressed mood On further review patient's Lexapro was continued during this admission. However it is 10mg . Patient could benefit from and increase as she continues to endorse symptoms of depression and issues with adjusting to the changes in her life. - Increase to 20mg  - Will reassess in AM if patient is still inpatient per medical team otherwise recommend patient follow-up with OP therapy, resources were provided for family to call for f/u by SW  Disposition:  Per primary team.  PGY-1 Freida Busman, MD 05/22/2021 11:33 AM

## 2021-05-22 NOTE — Progress Notes (Addendum)
Progress Note  Patient Name: [name] Date of Encounter: 05/22/2021  Primary Cardiologist: New to [facility]   Subjective   No complaints this AM. Denies chest pain or recurrent SOB.   Inpatient Medications    Scheduled Meds:  (feeding supplement) PROSource Plus  30 mL Oral BID BM   bacitracin   Topical BID   calcium-vitamin D  1 tablet Oral Q breakfast   cycloSPORINE  1 drop Both Eyes BID   escitalopram  10 mg Oral q AM   feeding supplement  1 Container Oral TID BM   folic acid  1 mg Oral Daily   losartan  25 mg Oral Daily   metoprolol succinate  100 mg Oral q AM   multivitamin with minerals  1 tablet Oral Daily   thiamine  100 mg Oral q AM   vitamin B-12  1,000 mcg Oral Daily   Continuous Infusions:  PRN Meds: acetaminophen, lidocaine, loperamide   Vital Signs    Vitals:   05/21/21 1954 05/21/21 1956 05/21/21 2332 05/22/21 0541  BP: (!) 163/103 (!) 163/108 (!) 159/100 (!) 148/94  Pulse: 87 85 92 79  Resp:    18  Temp:    98.3 F (36.8 C)  TempSrc:      SpO2: 92%  95% 100%  Weight:      Height:        Intake/Output Summary (Last 24 hours) at 05/22/2021 0759 Last data filed at 05/22/2021 0630 Gross per 24 hour  Intake 180 ml  Output 2250 ml  Net -2070 ml   Filed Weights   05/18/21 1003  Weight: 45 kg    Physical Exam   General: Ill appearing, NAD Neck: Negative for carotid bruits. No JVD Lungs:Clear to ausculation bilaterally. Breathing is unlabored. Cardiovascular: RRR with S1 S2. No murmurs Extremities: No edema. Neuro: Alert and oriented. No focal deficits. No facial asymmetry. MAE spontaneously. Psych: Responds to questions appropriately with normal affect.    Labs    Chemistry Recent Labs  Lab 05/18/21 1000 05/19/21 0221 05/20/21 0029 05/21/21 0113 05/21/21 2144 05/22/21 0249  NA 143 138   < > 139 137 140  K 4.1 4.0   < > 3.1* 3.4* 3.5  CL 110 102   < > 106 105 106  CO2 15* 17*   < > 19* 22 23  GLUCOSE 162* 83   < > 93  108* 85  BUN 22 22   < > 17 14 13   CREATININE 2.42* 1.36*   < > 0.91 0.86 0.89  CALCIUM 8.7* 8.1*   < > 7.9* 8.3* 8.2*  PROT 6.4* 5.5*  --  5.1*  --   --   ALBUMIN 3.9 3.3*  --  2.7*  --   --   AST 116* 118*  --  118*  --   --   ALT 43 46*  --  57*  --   --   ALKPHOS 85 61  --  57  --   --   BILITOT 1.6* 1.3*  --  0.8  --   --   GFRNONAA 34* 40*   < > >60 >60 >60  ANIONGAP 18* 19*   < > 14 10 11    < > = values in this interval not displayed.     Hematology Recent Labs  Lab 05/20/21 0029 05/21/21 0113 05/22/21 0249  WBC 12.2* 7.6 7.3  RBC 3.39* 3.12* 3.40*  HGB 11.0* 10.2* 11.1*  HCT 34.0* 31.0* 33.8*  MCV 100.3* 99.4 99.4  MCH 32.4 32.7 32.6  MCHC 32.4 32.9 32.8  RDW 15.1 14.9 15.1  PLT 63* 70* 81*    Cardiac EnzymesNo results for input(s): TROPONINI in the last 168 hours. No results for input(s): TROPIPOC in the last 168 hours.   BNPNo results for input(s): BNP, PROBNP in the last 168 hours.   DDimer No results for input(s): DDIMER in the last 168 hours.   Radiology    DG CHEST PORT 1 VIEW  Result Date: 05/21/2021 CLINICAL DATA:  Chest pain.  Recent fall. EXAM: PORTABLE CHEST 1 VIEW COMPARISON:  Radiograph and chest CT 05/18/2021 FINDINGS: Lower lung volumes from prior exam. Stable heart size and mediastinal contours. No element of bilateral perihilar and infrahilar opacities from prior exam. Probable bilateral pleural effusions. No pneumothorax. Left-sided rib fractures again seen. IMPRESSION: 1. Lower lung volumes from prior exam with new bilateral perihilar and infrahilar opacities. Differential considerations include pulmonary edema or multifocal infection. Recommend correlation for aspiration risk factors. 2. Probable bilateral pleural effusions. 3. Left rib fractures. Electronically Signed   By: Keith Rake M.D.   On: 05/21/2021 19:35    Telemetry    05/22/21 NSR - Personally Reviewed  ECG    No new tracing 05/22/21 - Personally Reviewed  Cardiac Studies    Echocardiogram 05/19/21:   1. The entire mid ventricle to apex is akinetic. 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 . Left ventricular  ejection fraction, by estimation, is 35%. The left ventricle has  moderately decreased function. The left  ventricle demonstrates regional wall motion abnormalities (see scoring  diagram/findings for description). Left ventricular diastolic parameters  are indeterminate.   2. Right ventricular systolic function is normal. The right ventricular  size is normal.   3. The mitral valve is normal in structure. Mild to moderate mitral valve  regurgitation.   4. The aortic valve is tricuspid. Aortic valve regurgitation is mild. No  aortic stenosis is present.   5. The inferior vena cava is normal in size with greater than 50%  respiratory variability, suggesting right atrial pressure of 3 mmHg.   6. Cannot exclude small pfo with left to right shunt.   Patient Profile     80 y.o. 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 Cardiology is consulted for elevated troponin.   Assessment & Plan    1. HFrEF: -Pt had an episode of SOB yesterday felt to be secondary to fluid volume resuscitation>>>given a dose of IV Lasix  -Weight,  -I&O, remains net positive 2.8L -Plan still to hold off on invasive ischemic evaluation until patient more at baseline>>>not felt to be an optimal candidate for intervention due to ETOH abuse and frequent falls if loner term DAPT needed in the setting of possible intervention -Continue beta blocker -Low dose losartan 25mg  QD added yesterday -Continue IV Lasix 20mg  daily>>>likely will need PO dosing at d/c versus PRN  -Creatinine, 0.89 today     2. 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 CKD stage III: -Unclear baseline>>>creatinine this admission at 1.55>>1.36>>1.26>>>0.91>>>0.89 today  -Tolerating losartan    4. ETOH  disorder: -Daughter bedside who report years of alcohol abuse. Pt lives alone and was found face down after at least 12 hours. This complicates the use of DAPT if intervention needed. Will reassess daily and now Psych is involved which may help  going forward.    Signed, Kathyrn Drown NP-C [facility] Pager: [phone] 05/22/2021, 7:59 AM     For questions or updates, please contact   Please consult [email or web address] for contact info under Cardiology/STEMI.   Patient seen and examined with Kathyrn Drown NP-C.  Agree as above, with the following exceptions and changes as noted below. Received lasix yesterday with improvement in breathing. . Gen: NAD, CV: RRR, Lungs: clear, Abd: soft, Extrem: Warm, well perfused, no edema, Neuro/Psych: alert and oriented x 3, normal mood and affect. All available labs, radiology testing, previous records reviewed. Can continued as needed daily lasix for shortness of breath, would likely need low dose daily lasix homegoing, we will reassess prior to discharge. May be best to defer ischemic workup to outpatient setting for best imaging capabilities but will reassess prior to patient's anticipated discharge.   Elouise Munroe, MD 05/22/21 11:37 AM

## 2021-05-22 NOTE — Progress Notes (Signed)
Physical Therapy Treatment Patient Details Name: [name] MRN: [id number] DOB: [birth year] Today's Date: 05/22/2021    History of Present Illness [name] is a 80 y.o. 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    PT Comments    Pt was up in chair when PT arrived, reported her daughter had gone off to investigate SNF residences for her.  Pt is not willing to stand up from the chair but agreed to let PT help her move her legs.  Even this exercise was a challenge as pt had skin trauma that hurt her.  Noted to nursing that pt had wound adhered to her support stocking to get the spot remedied.  Pt is left in chair with chair alarm, and will try to do more standing next session.  PT had been informed by other rehab staff member that pt was afraid to move, but lacked family encouragement this session.  Follow for acute PT goals.   Follow Up Recommendations  SNF     Equipment Recommendations  Rolling walker with 5" wheels;3in1 (PT)    Recommendations for Other Services       Precautions / Restrictions Precautions Precautions: Fall Precaution Comments: mult open wounds Restrictions Weight Bearing Restrictions: No    Mobility  Bed Mobility               General bed mobility comments: up inchair    Transfers                 General transfer comment: refusing to stand  Ambulation/Gait                 Stairs             Wheelchair Mobility    Modified Rankin (Stroke Patients Only)       Balance     Sitting balance-Leahy Scale: Fair                                      Cognition Arousal/Alertness:  Awake/alert Behavior During Therapy: Anxious Overall Cognitive Status: Impaired/Different from baseline Area of Impairment: Following commands;Memory;Attention;Orientation                 Orientation Level: Situation Current Attention Level: Selective Memory: Decreased recall of precautions;Decreased short-term memory Following Commands: Follows one step commands inconsistently;Follows one step commands with increased time Safety/Judgement: Decreased awareness of safety;Decreased awareness of deficits Awareness: Intellectual   General Comments: getting upset about skin wounds being sensitive      Exercises General Exercises - Lower Extremity Ankle Circles/Pumps: AAROM;5 reps Quad Sets: AROM;10 reps Gluteal Sets: AROM;10 reps Heel Slides: AAROM;10 reps;AROM Hip ABduction/ADduction: AROM;AAROM;10 reps Straight Leg Raises: AROM;AAROM;10 reps    General Comments        Pertinent Vitals/Pain Pain Assessment: Faces Faces Pain Scale: Hurts even more Pain Location: general pain from skin breakdown areas Pain Descriptors / Indicators: Grimacing;Guarding Pain Intervention(s): Monitored during session;Repositioned    Home Living                      Prior Function            PT Goals (current  goals can now be found in the care plan section) Acute Rehab PT Goals Patient Stated Goal: to get home Progress towards PT goals: Not progressing toward goals - comment    Frequency    Min 2X/week      PT Plan Current plan remains appropriate    Co-evaluation              AM-PAC PT "6 Clicks" Mobility   Outcome Measure  Help needed turning from your back to your side while in a flat bed without using bedrails?: A Little Help needed moving from lying on your back to sitting on the side of a flat bed without using bedrails?: A Lot Help needed moving to and from a bed to a chair (including a wheelchair)?: A Lot Help needed standing up from a chair using  your arms (e.g., wheelchair or bedside chair)?: A Lot Help needed to walk in hospital room?: A Lot Help needed climbing 3-5 steps with a railing? : A Lot 6 Click Score: 13    End of Session   Activity Tolerance: Patient limited by pain;Other (comment) (reluctance to move at all) Patient left: in chair;with call bell/phone within reach;with chair alarm set;with family/visitor present Nurse Communication: Mobility status PT Visit Diagnosis: Unsteadiness on feet (R26.81);Other abnormalities of gait and mobility (R26.89);History of falling (Z91.81)     Time: 6712-4580 PT Time Calculation (min) (ACUTE ONLY): 21 min  Charges:  $Therapeutic Exercise: 8-22 mins                   Ramond Dial 05/22/2021, 10:40 PM  Mee Hives, PT MS Acute Rehab Dept. Number: [place] [facility] [place]

## 2021-05-22 NOTE — Progress Notes (Signed)
Family Medicine Teaching Service Daily Progress Note Intern Pager: [phone]  Patient name: [name] Medical record number: [id number] Date of birth: 12-Jun-1941 Age: 80 y.o. Gender: female  Primary Care Provider: Asencion Noble, MD Consultants: Cardiology, Wound care, Psychiatry Code Status: Full  Pt Overview and Major Events to Date:  05/18/2021: Admitted to FPTS  Assessment and Plan: [name] is a 80 year old female presenting after fall.  PMH is significant for anxiety, arthritis, alcohol use disorder, carcinoid bronchial adenoma of right lung, depression, GERD, kidney stones, liver disease, osteoporosis, subarachnoid hemorrhage, macrocytic anemia and thrombocytopenia.  Hypokalemia K+ 3.4> 3.5, received 40 meq dose early am -- BMP in a.m. --Monitor closely  Fall I Leg wounds No acute overnight events.  Wounds look much better and bruising in different stages of healing.  Palliative care is following.'s PT/OT suggested SNF placement.  Psychiatry following for patient's medical decision making capacity. --Psychiatry following,  appreciate psychiatry assistance --Palliative care following, appreciate their assistance --PT/OT eval and treat --Delirium precautions --Fall precautions  HFrEF I New cardiomyopathy UO~ 2.2L, received IV 40 Lasix yesterday.  Denies any chest discomfort.  Chest x-ray consistent with pulmonary edema.  Patient still has mild rales on pulmonary exam.  Cardiology is following the plan to hold off on invasive ischemic evaluation until patient more at baseline. --Cardiology following, appreciate their recommendations --IV 20 Lasix  --Strict I's and O's --Daily weight --c/w Metoprolol 100 mg daily --c/w Losartan 25 mg    HTN SBP 148-163, DBP 79-108 --c/w metoprolol 100 mg  Alcohol use disorder No cravings, agitation reported overnight. --c/w CIWA  Thrombocytopenia I Macrocytic Anemia Platelets 68>63>70> 81, Hg 10.5>11>10.2> 11.1 --Hold chemical  prophylaxis --CBC in am  Rhabdomyolysis CK 1071>3028>3616>1886>1153 -- Hold IV fluids in the event of flash pulmonary edema yesterday  AKI on CKD III Scr 1.55>1.36>1.26>0.91>0.89 --BMP in am --Monitor closely  FEN/GI: Regular diet PPx: SCD's or Ted Hose   Status is: Inpatient  Remains inpatient appropriate because:Inpatient level of care appropriate due to severity of illness  Dispo: The patient is from: Home              Anticipated d/c is to: SNF              Patient currently is not medically stable to d/c.   Difficult to place patient No        Subjective:  Patient had an episode of hypoglycemia this morning with blood glucose 67, received orange juice and blood glucose was 122. Denies any chest pain or shortness of breath this a.m.  Objective: Temp:  [98 F (36.7 C)-98.3 F (36.8 C)] 98.3 F (36.8 C) (06/22 0541) Pulse Rate:  [79-92] 79 (06/22 0541) Resp:  [18-20] 18 (06/22 0541) BP: (148-163)/(79-108) 148/94 (06/22 0541) SpO2:  [92 %-100 %] 100 % (06/22 0541) Physical Exam: General: Frail, elderly lady sitting comfortably in bed, NAD Cardiovascular: Regular rate and rhythm Respiratory: Mild rales present in all the lung fields Abdomen: Soft, non-tender, non-distended, BS+ Extremities: No peripheral edema appreciated Skin: Ecchymotics areas in different stages of healing  Laboratory: Recent Labs  Lab 05/20/21 0029 05/21/21 0113 05/22/21 0249  WBC 12.2* 7.6 7.3  HGB 11.0* 10.2* 11.1*  HCT 34.0* 31.0* 33.8*  PLT 63* 70* 81*   Recent Labs  Lab 05/18/21 1000 05/19/21 0221 05/20/21 0029 05/21/21 0113 05/21/21 2144 05/22/21 0249  NA 143 138   < > 139 137 140  K 4.1 4.0   < > 3.1* 3.4* 3.5  CL  110 102   < > 106 105 106  CO2 15* 17*   < > 19* 22 23  BUN 22 22   < > 17 14 13   CREATININE 1.55* 1.36*   < > 0.91 0.86 0.89  CALCIUM 8.7* 8.1*   < > 7.9* 8.3* 8.2*  PROT 6.4* 5.5*  --  5.1*  --   --   BILITOT 1.6* 1.3*  --  0.8  --   --   ALKPHOS  85 61  --  57  --   --   ALT 43 46*  --  57*  --   --   AST 116* 118*  --  118*  --   --   GLUCOSE 162* 83   < > 93 108* 85   < > = values in this interval not displayed.    Imaging/Diagnostic Tests: DG CHEST PORT 1 VIEW  Result Date: 05/21/2021 CLINICAL DATA:  Chest pain.  Recent fall. EXAM: PORTABLE CHEST 1 VIEW COMPARISON:  Radiograph and chest CT 05/18/2021 FINDINGS: Lower lung volumes from prior exam. Stable heart size and mediastinal contours. No element of bilateral perihilar and infrahilar opacities from prior exam. Probable bilateral pleural effusions. No pneumothorax. Left-sided rib fractures again seen. IMPRESSION: 1. Lower lung volumes from prior exam with new bilateral perihilar and infrahilar opacities. Differential considerations include pulmonary edema or multifocal infection. Recommend correlation for aspiration risk factors. 2. Probable bilateral pleural effusions. 3. Left rib fractures. Electronically Signed   By: Keith Rake M.D.   On: 05/21/2021 19:35     Rooney Swails, Meredith Staggers, MD 05/22/2021, 9:10 AM PGY-1, [place] Intern pager: [phone], text pages welcome

## 2021-05-22 NOTE — TOC Progression Note (Addendum)
Transition of Care [facility]) - Progression Note    Patient Details  Name: [name] MRN: [id number] Date of Birth: 1940-12-12  Transition of Care [facility]) CM/SW [place], [place] Phone Number: 05/22/2021, 10:56 AM  Clinical Narrative:     CSW spoke pt and daughter [name] at bedside. CSW gave SNF bed offers. [place] ill review list with pt and sister [name] and have an answer by the end of the day.   CSW spoke to [place] who chose [facility] for SNF. [facility] will let csw know tomorrow morning if they can accept pt Thursday or Friday.   Expected Discharge Plan: [place] Barriers to Discharge: Continued Medical Work up  Expected Discharge Plan and Services Expected Discharge Plan: [place] arrangements for the past 2 months: Single Family Home                                       Social Determinants of Health (SDOH) Interventions    Readmission Risk Interventions No flowsheet data found.  Emeterio Reeve, Latanya Presser, [place] Social Worker [phone]

## 2021-05-23 DIAGNOSIS — E860 Dehydration: Secondary | ICD-10-CM | POA: Diagnosis not present

## 2021-05-23 DIAGNOSIS — N183 Chronic kidney disease, stage 3 unspecified: Secondary | ICD-10-CM | POA: Diagnosis not present

## 2021-05-23 DIAGNOSIS — I509 Heart failure, unspecified: Secondary | ICD-10-CM | POA: Diagnosis not present

## 2021-05-23 LAB — CBC
HCT: 36.1 % (ref 36.0–46.0)
Hemoglobin: 12 g/dL (ref 12.0–15.0)
MCH: 32.9 pg (ref 26.0–34.0)
MCHC: 33.2 g/dL (ref 30.0–36.0)
MCV: 98.9 fL (ref 80.0–100.0)
Platelets: 91 10*3/uL — ABNORMAL LOW (ref 150–400)
RBC: 3.65 MIL/uL — ABNORMAL LOW (ref 3.87–5.11)
RDW: 15.3 % (ref 11.5–15.5)
WBC: 7.1 10*3/uL (ref 4.0–10.5)
nRBC: 0.4 % — ABNORMAL HIGH (ref 0.0–0.2)

## 2021-05-23 LAB — CK: Total CK: 768 U/L — ABNORMAL HIGH (ref 38–234)

## 2021-05-23 LAB — GLUCOSE, CAPILLARY
Glucose-Capillary: 85 mg/dL (ref 70–99)
Glucose-Capillary: 87 mg/dL (ref 70–99)
Glucose-Capillary: 88 mg/dL (ref 70–99)

## 2021-05-23 LAB — BASIC METABOLIC PANEL
Anion gap: 8 (ref 5–15)
BUN: 17 mg/dL (ref 8–23)
CO2: 28 mmol/L (ref 22–32)
Calcium: 8.3 mg/dL — ABNORMAL LOW (ref 8.9–10.3)
Chloride: 100 mmol/L (ref 98–111)
Creatinine, Ser: 0.89 mg/dL (ref 0.44–1.00)
GFR, Estimated: >60 mL/min (ref >60)
Glucose, Bld: 114 mg/dL — ABNORMAL HIGH (ref 70–99)
Potassium: 3.2 mmol/L — ABNORMAL LOW (ref 3.5–5.1)
Sodium: 136 mmol/L (ref 135–145)

## 2021-05-23 MED ORDER — POTASSIUM CHLORIDE 20 MEQ PO PACK: 40.0000 meq | PACK | Freq: Once | ORAL | Status: DC

## 2021-05-23 MED ORDER — SPIRONOLACTONE 25 MG PO TABS
25.0000 mg | ORAL_TABLET | Freq: Every day | ORAL | Status: DC
  Administered 2021-05-23 – 2021-05-24 (×2): 25 mg via ORAL
  Filled 2021-05-23 (×2): qty 1

## 2021-05-23 MED ORDER — FUROSEMIDE 20 MG PO TABS
20.0000 mg | ORAL_TABLET | Freq: Every day | ORAL | Status: AC
  Administered 2021-05-23 – 2021-05-24 (×2): 20 mg via ORAL
  Filled 2021-05-23 (×2): qty 1

## 2021-05-23 MED ORDER — POTASSIUM CHLORIDE 20 MEQ PO PACK
40.0000 meq | PACK | Freq: Once | ORAL | Status: AC
  Administered 2021-05-23: 40 meq via ORAL
  Filled 2021-05-23: qty 2

## 2021-05-23 NOTE — Progress Notes (Addendum)
Family Medicine Teaching Service Daily Progress Note Intern Pager: [phone]  Patient name: [name] Medical record number: [id number] Date of birth: [birth year] Age: 80 y.o. Gender: female  Primary Care Provider: Asencion Noble, MD Consultants: Cardiology, Wound care, Psychiatry Code Status: Full  Pt Overview and Major Events to Date:  05/18/2021: Admitted to FPTS  Assessment and Plan: [name] is a 80 year old female presenting after fall.  PMH is significant for anxiety, arthritis, alcohol use disorder, carcinoid bronchial adenoma of right lung, depression, GERD, kidney stones, liver disease, osteoporosis, subarachnoid hemorrhage, macrocytic anemia and thrombocytopenia.  Hypokalemia K+ 3.2 this am.  --Kcl 40 meq --Monitor closely --BMP in am  Fall I Leg wounds Wounds looks much better and bruising resolving and in different stages of healing.  Palliative care is following. PT/OT suggested SNF placement.  Psychiatry is following for patient medical decision-making capacity and adjustment disorder. --Psychiatry following, appreciate assistance --Palliative care following, appreciate their assistance --PT/OT eval and treat --Delirium precautions --Fall precautions  HFrEF I New cardiomyopathy Patient received IV 20 Lasix yesterday.  Denies any chest pain this morning.  Physical exam shows clear to auscultation.  Cardiology is following and plan to hold off any invasive ischemic evaluation. --Cardiology following, appreciate their recommendations --Start PO Lasix 20 --Strict I's and O's --Daily weights --c/w Metoprolol 100 mg --c/w Losartan 25 mg  HTN SBP 128-143, DBP 72-89.  --c/w Metoprolol 100 mg  Thrombocytopenia I Macrocytic anemia Platelets 68>63>70>81>91, Hg 10.5>11>10.2>11.2> 12  -- Hold chemical prophylaxis --CBC in a.m.  Rhabdomyolysis-improving CK 1071>3028>3616>1886>1153> -- Hold IV fluids in the event of flash pulmonary edema recently  Adjustment  disorder w/depressed mood Psychiatry is following and they recommended Lexapro 20 mg --Appreciate psychiatry assistance --c/w Lexapro 20 mg  FEN/GI: Regular diet PPx: Scd's vs Ted Hose Dispo:SNF in 2-3 days. Barriers include SNF placement  Subjective:  No acute overnight events. Patient evaluated  at bedside and denies any new complaints  Objective: Temp:  [97.5 F (36.4 C)-98.6 F (37 C)] 98.6 F (37 C) (06/23 1341) Pulse Rate:  [67-84] 73 (06/23 1341) Resp:  [16-18] 16 (06/23 1341) BP: (124-143)/(72-99) 143/99 (06/23 1341) SpO2:  [95 %-100 %] 100 % (06/23 1341) Physical Exam: General: Elderly frail lady sitting comfortably in bed, NAD Cardiovascular: Regular rate and rhythm Respiratory: Breathing normally on room air,Clear to auscultation bilaterally Abdomen: Soft, nontender, nondistended, bowel sounds present Extremities: No peripheral edema appreciated Skin: Ecchymotic areas in different stages of healing   Laboratory: Recent Labs  Lab 05/22/21 0249 05/22/21 1045 05/23/21 0515  WBC 7.3 8.1 7.1  HGB 11.1* 11.2* 12.0  HCT 33.8* 34.5* 36.1  PLT 81* 81* 91*   Recent Labs  Lab 05/18/21 1000 05/19/21 0221 05/20/21 0029 05/21/21 0113 05/21/21 2144 05/22/21 0249 05/23/21 0138  NA 143 138   < > 139 137 140 136  K 4.1 4.0   < > 3.1* 3.4* 3.5 3.2*  CL 110 102   < > 106 105 106 100  CO2 15* 17*   < > 19* 22 23 28   BUN 22 22   < > 17 14 13 17   CREATININE 1.55* 1.36*   < > 0.91 0.86 0.89 0.89  CALCIUM 8.7* 8.1*   < > 7.9* 8.3* 8.2* 8.3*  PROT 6.4* 5.5*  --  5.1*  --   --   --   BILITOT 1.6* 1.3*  --  0.8  --   --   --   ALKPHOS 85 61  --  57  --   --   --   ALT 43 46*  --  57*  --   --   --   AST 116* 118*  --  118*  --   --   --   GLUCOSE 162* 83   < > 93 108* 85 114*   < > = values in this interval not displayed.    Imaging/Diagnostic Tests: No results found.   Honor Junes, MD 05/23/2021, 4:57 PM PGY-1, [place] Intern pager:  [phone], text pages welcome

## 2021-05-23 NOTE — TOC Progression Note (Addendum)
Transition of Care [facility]) - Progression Note    Patient Details  Name: [name] DOBOSZ MRN: [id number] Date of Birth: Apr 30, 1941  Transition of Care [facility]) CM/SW [place], [place] Phone Number: 05/23/2021, 1:53 PM  Clinical Narrative:     CSW spoke to pts daughter [name] on phone. Kristen asked CSW to fax referral to [place], [facility] rehab and the [facility] at [facility] in [place].   CSW also explained that [facility] is outside of [place] 47 mile radius and the family would have to pay upfront. [place]tar base fee is around $350 plus around $15 a mile. [name] will review this with family.   2:00- [facility] denied pt due to alcohol use.  [facility] has no bed availability   3:00- daughter is not requesting [facility]. CSW left message for admissions coordinator to call back. CSW still waiting on call back from [facility] at [place].   Expected Discharge Plan: [place] Barriers to Discharge: Continued Medical Work up  Expected Discharge Plan and Services Expected Discharge Plan: [place] arrangements for the past 2 months: Single Family Home                                       Social Determinants of Health (SDOH) Interventions    Readmission Risk Interventions No flowsheet data found.  Emeterio Reeve, Latanya Presser, [place] Social Worker [phone]

## 2021-05-23 NOTE — Consult Note (Signed)
[facility] Consult   Reason for Consult:  Capacity evaluation Referring Physician:  Lissa Morales, MD Patient Identification: [name] MRN:  [id number] Principal Diagnosis: Dehydration Diagnosis:  Principal Problem:   Dehydration Active Problems:   Fall   Myocardial injury   Generalized weakness   Rhabdomyolysis   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HCC)   Atherosclerosis of aorta (HCC)   Multiple skin tears   Multiple Bruising sites   Poor historian   Acute renal injury ([place])   Transaminitis   Bilirubinemia   Hypoproteinemia (HCC)   Elevated troponin   Vitamin B12 deficiency   Macrocytic anemia   Thrombocytopenia (HCC)   TSH elevation   Pyuria   Hematuria   Cerebral atrophy (HCC)   Degenerative disc disease, cervical   Bilateral Periorbital ecchymosis   Elevated CK   Multiple closed fractures of ribs of left side   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 female patient admitted  admitted after a fall ans subsequently found to have rhabdo, thrombocytopenia w/ macrocytic anemia 2/2 EtoH use disorder, AKI on CKD 3,elevated Trp 2/2 rhabdo, and HFrEF. Patient has a PMH of anxiety, arthritis, alcohol use disorder, carcinoid bronchial adenoma of right lung, depression, GERD, kidney stones, liver disease, osteoporosis, subarachnoid hemorrhage, macrocytic anemia and thrombocytopenia. During stay patient was noted to be very agitated and there has been question if patient has the capacity to make her own decisions.   HPI:  On assessment patient is up to chair today and appears to have a  more upbeat affect. Patient reports she "does not know" how she feels today and does not remember the provider or our conversation. As provider summarizes some of the things that were spoken about the day before the patient appears to remain agreeable and for the first time does not mention being upset with her children for taking away her car keys or at the mention of having some assistance. Patient reports she is now looking forward to starting a new activity and being involved in something outside of her home. Patient denied SI, HI, and AVH. Patient was very pleasant.   Past Psychiatric History: Depression   Risk to Self:  NO Risk to Others:  NO Prior Inpatient Therapy:  NO Prior Outpatient Therapy:  NO    Past Medical History:  Past Medical History:  Diagnosis Date   Abdominal wall hernia, periumbilical, fat-containing 05/19/2021   CT [facility] 05/18/21: Fat containing periumbilical anterior abdominal wall hernia.  Second fat and bowel containing infraumbilical anterior abdominal wall hernia. No CT evidence of bowel incarceration.   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   Macrocytic anemia 05/19/2021   Mitral valve regurgitation, moderate 05/22/2021  Multiple closed fractures of ribs of left side 05/18/2021   Osteoporosis    Treated with denosumab   Physical deconditioning    Recurrent falls    Recurrent kidney stones    Calcium oxalate monohydrate on stone analysis 05/2020 at [facility] urology   Seizure [facility])    started on Keppra after [facility]   Subarachnoid hemorrhage ([place])  12/19/2019    Past Surgical History:  Procedure Laterality Date   ABDOMINAL HYSTERECTOMY     Vaginal   APPENDECTOMY     CARPAL TUNNEL RELEASE Right 01/02/2012   Dr Fredna Dow ([facility])   CYSTOSCOPY W/ RETROGRADES Bilateral 02/28/2018   Procedure: CYSTOSCOPY WITH BILATERAL RETROGRADE PYELOGRAM;BILATERAL URETERAL STENT PLACEMENT;  Surgeon: Cleon Gustin, MD;  Location: [facility];  Service: Urology;  Laterality: Bilateral;   SMALL INTESTINE SURGERY  2004   Iliectomy   THORACOTOMY  12/01/2005   VATS Right Upper Lung Lobe - Dr Arlyce Dice ([facility])   TONSILLECTOMY     Family History: History reviewed. No pertinent family history. Family Psychiatric  History: None known Social History:  Social History   Substance and Sexual Activity  Alcohol Use None     Social History   Substance and Sexual Activity  Drug Use Not on file    Social History   Socioeconomic History   Marital status: Widowed    Spouse name: Not on file   Number of children: Not on file   Years of education: Not on file   Highest education level: Not on file  Occupational History   Not on file  Tobacco Use   Smoking status: Not on file   Smokeless tobacco: Not on file  Substance and Sexual Activity   Alcohol use: Not on file   Drug use: Not on file   Sexual activity: Not on file  Other Topics Concern   Not on file  Social History Narrative   Not on file   Social Determinants of Health   Financial Resource Strain: Not on file  Food Insecurity: Not on file  Transportation Needs: Not on file  Physical Activity: Not on file  Stress: Not on file  Social Connections: Not on file   Additional Social History:    Allergies:   Allergies  Allergen Reactions   Clindamycin/Lincomycin Rash   Doxycycline Rash    Labs:  Results for orders placed or performed during the hospital encounter of 05/18/21 (from the past 48 hour(s))  Glucose, capillary     Status: Abnormal   Collection Time: 05/21/21  6:18 PM   Result Value Ref Range   Glucose-Capillary 107 (H) 70 - 99 mg/dL    Comment: Glucose reference range applies only to samples taken after fasting for at least 8 hours.  Troponin I (High Sensitivity)     Status: Abnormal   Collection Time: 05/21/21  9:44 PM  Result Value Ref Range   Troponin I (High Sensitivity) 731 (HH) <18 ng/L    Comment: CRITICAL VALUE NOTED.  VALUE IS CONSISTENT WITH PREVIOUSLY REPORTED AND CALLED VALUE. (NOTE) Elevated high sensitivity troponin I (hsTnI) values and significant  changes across serial measurements may suggest ACS but many other  chronic and acute conditions are known to elevate hsTnI results.  Refer to the Links section for chest pain algorithms and additional  guidance. Performed at [facility], [place] [address], [place], [place] [address]   Basic metabolic panel     Status: Abnormal   Collection Time: 05/21/21  9:44 PM  Result  Value Ref Range   Sodium 137 135 - 145 mmol/L   Potassium 3.4 (L) 3.5 - 5.1 mmol/L   Chloride 105 98 - 111 mmol/L   CO2 22 22 - 32 mmol/L   Glucose, Bld 108 (H) 70 - 99 mg/dL    Comment: Glucose reference range applies only to samples taken after fasting for at least 8 hours.   BUN 14 8 - 23 mg/dL   Creatinine, Ser 0.86 0.44 - 1.00 mg/dL   Calcium 8.3 (L) 8.9 - 10.3 mg/dL   GFR, Estimated >60 >60 mL/min    Comment: (NOTE) Calculated using the CKD-EPI Creatinine Equation (2021)    Anion gap 10 5 - 15    Comment: Performed at [facility] [address] [place], [place] [address]  Glucose, capillary     Status: None   Collection Time: 05/21/21 11:34 PM  Result Value Ref Range   Glucose-Capillary 93 70 - 99 mg/dL    Comment: Glucose reference range applies only to samples taken after fasting for at least 8 hours.  CBC     Status: Abnormal   Collection Time: 05/22/21  2:49 AM  Result Value Ref Range   WBC 7.3 4.0 - 10.5 K/uL   RBC 3.40 (L) 3.87 - 5.11 MIL/uL   Hemoglobin 11.1 (L) 12.0 - 15.0 g/dL    HCT 33.8 (L) 36.0 - 46.0 %   MCV 99.4 80.0 - 100.0 fL   MCH 32.6 26.0 - 34.0 pg   MCHC 32.8 30.0 - 36.0 g/dL   RDW 15.1 11.5 - 15.5 %   Platelets 81 (L) 150 - 400 K/uL    Comment: Immature Platelet Fraction may be clinically indicated, consider ordering this additional test [id number] CONSISTENT WITH PREVIOUS RESULT REPEATED TO VERIFY    nRBC 0.4 (H) 0.0 - 0.2 %    Comment: Performed at [facility], [place] [address] [place], [place] [address]  CK     Status: Abnormal   Collection Time: 05/22/21  2:49 AM  Result Value Ref Range   Total CK 1,153 (H) 38 - 234 U/L    Comment: Performed at [facility], [place] [address], [place], [place] [address]  Basic metabolic panel     Status: Abnormal   Collection Time: 05/22/21  2:49 AM  Result Value Ref Range   Sodium 140 135 - 145 mmol/L   Potassium 3.5 3.5 - 5.1 mmol/L   Chloride 106 98 - 111 mmol/L   CO2 23 22 - 32 mmol/L   Glucose, Bld 85 70 - 99 mg/dL    Comment: Glucose reference range applies only to samples taken after fasting for at least 8 hours.   BUN 13 8 - 23 mg/dL   Creatinine, Ser 0.89 0.44 - 1.00 mg/dL   Calcium 8.2 (L) 8.9 - 10.3 mg/dL   GFR, Estimated >60 >60 mL/min    Comment: (NOTE) Calculated using the CKD-EPI Creatinine Equation (2021)    Anion gap 11 5 - 15    Comment: Performed at [facility] [address] [place], [place] [address]  Glucose, capillary     Status: Abnormal   Collection Time: 05/22/21  5:43 AM  Result Value Ref Range   Glucose-Capillary 67 (L) 70 - 99 mg/dL    Comment: Glucose reference range applies only to samples taken after fasting for at least 8 hours.  Glucose, capillary     Status: Abnormal   Collection Time: 05/22/21  6:19 AM  Result Value Ref Range   Glucose-Capillary 122 (H) 70 - 99 mg/dL    Comment: Glucose reference range applies only to samples taken after fasting for at least 8 hours.  CBC     Status: Abnormal   Collection Time: 05/22/21 10:45 AM  Result Value  Ref Range   WBC 8.1 4.0 - 10.5 K/uL   RBC 3.43 (L) 3.87 - 5.11 MIL/uL   Hemoglobin 11.2 (L) 12.0 - 15.0 g/dL   HCT 34.5 (L) 36.0 - 46.0 %   MCV 100.6 (H) 80.0 - 100.0 fL   MCH 32.7 26.0 - 34.0 pg   MCHC 32.5 30.0 - 36.0 g/dL   RDW 15.2 11.5 - 15.5 %   Platelets 81 (L) 150 - 400 K/uL    Comment: Immature Platelet Fraction may be clinically indicated, consider ordering this additional test [id number] CONSISTENT WITH PREVIOUS RESULT REPEATED TO VERIFY    nRBC 0.4 (H) 0.0 - 0.2 %    Comment: Performed at [facility], [address] [place], [place] [address]  Glucose, capillary     Status: Abnormal   Collection Time: 05/22/21  5:49 PM  Result Value Ref Range   Glucose-Capillary 139 (H) 70 - 99 mg/dL    Comment: Glucose reference range applies only to samples taken after fasting for at least 8 hours.  Glucose, capillary     Status: None   Collection Time: 05/23/21 12:01 AM  Result Value Ref Range   Glucose-Capillary 85 70 - 99 mg/dL    Comment: Glucose reference range applies only to samples taken after fasting for at least 8 hours.  CK     Status: Abnormal   Collection Time: 05/23/21  1:38 AM  Result Value Ref Range   Total CK 768 (H) 38 - 234 U/L    Comment: Performed at [facility], [place] [address], [place], [place] [address]  Basic metabolic panel     Status: Abnormal   Collection Time: 05/23/21  1:38 AM  Result Value Ref Range   Sodium 136 135 - 145 mmol/L   Potassium 3.2 (L) 3.5 - 5.1 mmol/L   Chloride 100 98 - 111 mmol/L   CO2 28 22 - 32 mmol/L   Glucose, Bld 114 (H) 70 - 99 mg/dL    Comment: Glucose reference range applies only to samples taken after fasting for at least 8 hours.   BUN 17 8 - 23 mg/dL   Creatinine, Ser 0.89 0.44 - 1.00 mg/dL   Calcium 8.3 (L) 8.9 - 10.3 mg/dL   GFR, Estimated >60 >60 mL/min    Comment: (NOTE) Calculated using the CKD-EPI Creatinine Equation (2021)    Anion gap 8 5 - 15    Comment: Performed at [facility] [address], [place], [place] [address]  Glucose, capillary     Status: None   Collection Time: 05/23/21  5:11 AM  Result Value Ref Range   Glucose-Capillary 88 70 - 99 mg/dL    Comment: Glucose reference range applies only to samples taken after fasting for at least 8 hours.  CBC     Status: Abnormal   Collection Time: 05/23/21  5:15 AM  Result Value Ref Range   WBC 7.1 4.0 - 10.5 K/uL   RBC 3.65 (L) 3.87 - 5.11 MIL/uL   Hemoglobin 12.0 12.0 - 15.0 g/dL   HCT 36.1 36.0 - 46.0 %   MCV 98.9 80.0 - 100.0 fL  MCH 32.9 26.0 - 34.0 pg   MCHC 33.2 30.0 - 36.0 g/dL   RDW 15.3 11.5 - 15.5 %   Platelets 91 (L) 150 - 400 K/uL    Comment: Immature Platelet Fraction may be clinically indicated, consider ordering this additional test [id number] CONSISTENT WITH PREVIOUS RESULT REPEATED TO VERIFY    nRBC 0.4 (H) 0.0 - 0.2 %    Comment: Performed at [facility], [place] [address], [place], [place] [address]  Glucose, capillary     Status: None   Collection Time: 05/23/21 12:24 PM  Result Value Ref Range   Glucose-Capillary 87 70 - 99 mg/dL    Comment: Glucose reference range applies only to samples taken after fasting for at least 8 hours.    Current Facility-Administered Medications  Medication Dose Route Frequency Provider Last Rate Last Admin   (feeding supplement) PROSource Plus liquid 30 mL  30 mL Oral BID BM McDiarmid, Blane Ohara, MD   30 mL at 05/23/21 1029   acetaminophen (TYLENOL) tablet 650 mg  650 mg Oral Q8H PRN Dagar, Meredith Staggers, MD       bacitracin ointment   Topical BID McDiarmid, Blane Ohara, MD   Given at 05/23/21 1048   calcium-vitamin D (OSCAL WITH D) 500-200 MG-UNIT per tablet 1 tablet  1 tablet Oral Q breakfast Maness, Philip, MD   1 tablet at 05/23/21 0825   cycloSPORINE (RESTASIS) 0.05 % ophthalmic emulsion 1 drop  1 drop Both Eyes BID Maness, Arnette Norris, MD   1 drop at 05/23/21 1241   escitalopram (LEXAPRO) tablet 20 mg  20 mg Oral q AM Damita Dunnings B, MD   20 mg at 05/23/21 1033    feeding supplement (BOOST / RESOURCE BREEZE) liquid 1 Container  1 Container Oral TID BM McDiarmid, Blane Ohara, MD   1 Container at 02/54/27 0623   folic acid (FOLVITE) tablet 1 mg  1 mg Oral Daily Maness, Philip, MD   1 mg at 05/23/21 1032   furosemide (LASIX) tablet 20 mg  20 mg Oral Daily Dagar, Anjali, MD   20 mg at 05/23/21 1241   lidocaine (LIDODERM) 5 % 1 patch  1 patch Transdermal BID PRN Dagar, Meredith Staggers, MD   1 patch at 05/20/21 1140   loperamide (IMODIUM) capsule 2 mg  2 mg Oral PRN Dagar, Meredith Staggers, MD   2 mg at 05/22/21 1044   losartan (COZAAR) tablet 25 mg  25 mg Oral Daily Cherlynn Kaiser A, MD   25 mg at 05/23/21 1034   metoprolol succinate (TOPROL-XL) 24 hr tablet 100 mg  100 mg Oral q AM Autry-Lott, Simone, DO   100 mg at 05/23/21 1030   multivitamin with minerals tablet 1 tablet  1 tablet Oral Daily Autry-Lott, Simone, DO   1 tablet at 05/23/21 1034   spironolactone (ALDACTONE) tablet 25 mg  25 mg Oral Daily Cherlynn Kaiser A, MD   25 mg at 05/23/21 1241   thiamine tablet 100 mg  100 mg Oral q AM Maness, Philip, MD   100 mg at 05/23/21 1036   vitamin B-12 (CYANOCOBALAMIN) tablet 1,000 mcg  1,000 mcg Oral Daily McDiarmid, Blane Ohara, MD   1,000 mcg at 05/23/21 1036    Musculoskeletal: Strength & Muscle Tone: decreased Gait & Station:  remains in chair on assessment Patient leans: N/A            Psychiatric Specialty Exam:  Presentation  General Appearance: Appropriate for Environment  Eye Contact:Good  Speech:Clear and Coherent  Speech Volume:Normal  Handedness: No data recorded  Mood and Affect  Mood:Euthymic  Affect:Appropriate   Thought Process  Thought Processes:Coherent  Descriptions of Associations:Intact  Orientation:Full (Time, Place and Person)  Thought Content:Logical  History of Schizophrenia/Schizoaffective disorder:No data recorded Duration of Psychotic Symptoms:No data recorded Hallucinations:Hallucinations: None  Ideas of  Reference:None  Suicidal Thoughts:Suicidal Thoughts: No  Homicidal Thoughts:Homicidal Thoughts: No   Sensorium  Memory:Immediate Poor; Recent Poor; Remote Fair  Judgment:-- (Improving)  Insight:Shallow   Executive Functions  Concentration:Fair  Attention Span:Fair  [place]  Language:Good   Psychomotor Activity  Psychomotor Activity:Psychomotor Activity: Normal   Assets  Assets:Communication Skills; Desire for Improvement; Resilience; Social Support   Sleep  Sleep:Sleep: Fair   Physical Exam: Physical Exam Constitutional:      Appearance: Normal appearance.  HENT:     Head: Normocephalic.  Eyes:     Extraocular Movements: Extraocular movements intact.     Conjunctiva/sclera: Conjunctivae normal.  Cardiovascular:     Rate and Rhythm: Normal rate.  Pulmonary:     Effort: Pulmonary effort is normal.     Breath sounds: Normal breath sounds.  Abdominal:     General: Abdomen is flat.  Musculoskeletal:        General: Normal range of motion.  Skin:    General: Skin is warm and dry.     Findings: Bruising present.     Comments: Bruising is improving  Neurological:     Mental Status: She is alert. Mental status is at baseline.   Review of Systems  Constitutional:  Negative for chills and fever.  HENT:  Negative for hearing loss.   Eyes:  Negative for blurred vision.  Respiratory:  Negative for cough and wheezing.   Cardiovascular:  Negative for chest pain.  Gastrointestinal:  Negative for abdominal pain.  Neurological:  Negative for dizziness.  Psychiatric/Behavioral:  Positive for memory loss. Negative for hallucinations and suicidal ideas.   Blood pressure 124/78, pulse 67, temperature 97.9 F (36.6 C), temperature source Oral, resp. rate 18, height 5' (1.524 m), weight 45 kg, SpO2 99 %. Body mass index is 19.38 kg/m.  Treatment Plan Summary: Medication management Adjustment disorder w/ depressed mood Patient appears  to be having some improvement in terms of depression symptoms after daily Lexapro and appears less agitated as well. Patient would benefit from continuing the medication OP.  - Continue Lexapro 20mg    Patient is determined to be psychiatrically stable at this time. Psychiatry will sign off. Please do not hesitate to call back if questions arise. Thank you for this consult.   Disposition:  Per primary.  PGY-1 Freida Busman, MD 05/23/2021 1:25 PM

## 2021-05-23 NOTE — Progress Notes (Signed)
Family Medicine Teaching Service Attending Note  I interviewed and examined patient [name] and reviewed their tests and x-rays.  I discussed with Dr. Demaris Callander.  See her progress note for today and also reviewed their note for today.  I agree with their assessment and plan.     Additionally  Good spirits Able to stand and walk with walker with minimal assistance  Medically stable for SNF

## 2021-05-23 NOTE — Progress Notes (Signed)
Physical Therapy Treatment Patient Details Name: DEION FORGUE MRN: [id number] DOB: Mar 28, 1941 Today's Date: 05/23/2021    History of Present Illness SHAKERIA ROBINETTE is a 80 y.o. female presenting with Fall; found down, likely for at least 12 hours; extensive bruising, an drhabdomyolysis; PMH is significant for Anxiety, Arthritis, Alcohol Use Disorder, Carcinoid bronchial adenoma of right lung ([place]), Depression, GERD, Kidney stones, Liver disease, Osteoporosis, Subarachnoid Hemmorage, Macrocytic Anemia, & Thrombocytopenia.    PT Comments    Pt was seen for better session but is still struggling with allowing staff to help her.  Followed up with nursing about the concerns, and family is aware as they are present for all education.  Pt is going to be recommended to SNF due to her limits of safety and endurance, awareness of her needs for help and her mult recent falls.  Will focus on encouraging pt to allow help, to see her sequence transfers and gait with more awareness of safety, and will anticipate that she may be approaching her limit for independence of movement since she dislikes help.  The family is investigating  SNF care, and is quite possibly going to need to remain in this environment if willing.  May possibly be ALF candidate as well.    Follow Up Recommendations  SNF     Equipment Recommendations  Rolling walker with 5" wheels;3in1 (PT)    Recommendations for Other Services       Precautions / Restrictions Precautions Precautions: Fall Precaution Comments: mult open wounds Restrictions Weight Bearing Restrictions: No    Mobility  Bed Mobility               General bed mobility comments: up in chair    Transfers Overall transfer level: Needs assistance Equipment used: 1 person hand held assist Transfers: Sit to/from Stand Sit to Stand: Min assist;Mod assist         General transfer comment: pt is unwilling initially to have PT stand next to her, spent  a lot of time trying to get PT to stand in front of her  Ambulation/Gait Ambulation/Gait assistance: Min assist Gait Distance (Feet): 68 Feet (34 x 2) Assistive device: 1 person hand held assist;Rolling walker (2 wheeled) Gait Pattern/deviations: Step-through pattern;Decreased stride length;Wide base of support;Drifts right/left;Trunk flexed Gait velocity: reduction   General Gait Details: min assist with RW to maneuver to BR and then to door, around room   Stairs             Wheelchair Mobility    Modified Rankin (Stroke Patients Only)       Balance Overall balance assessment: Needs assistance Sitting-balance support: Feet supported Sitting balance-Leahy Scale: Fair     Standing balance support: Bilateral upper extremity supported Standing balance-Leahy Scale: Poor                              Cognition Arousal/Alertness: Awake/alert Behavior During Therapy: Anxious;Impulsive Overall Cognitive Status: Impaired/Different from baseline Area of Impairment: Problem solving;Awareness;Safety/judgement;Following commands;Memory;Attention;Orientation                 Orientation Level: Situation Current Attention Level: Selective Memory: Decreased recall of precautions;Decreased short-term memory Following Commands: Follows one step commands inconsistently;Follows one step commands with increased time Safety/Judgement: Decreased awareness of safety;Decreased awareness of deficits Awareness: Intellectual Problem Solving: Slow processing;Requires verbal cues;Requires tactile cues General Comments: pt is unhappy about PT trying to maintain contact with her to walk.  Impulsive  and trying to push PT away      Exercises General Exercises - Lower Extremity Ankle Circles/Pumps: AAROM;5 reps Quad Sets: AROM;10 reps Gluteal Sets: AAROM;10 reps Heel Slides: AAROM;AROM;10 reps Hip ABduction/ADduction: AAROM;10 reps    General Comments General comments  (skin integrity, edema, etc.): wounds on L leg are sticking to her ted stocking, let nursing know      Pertinent Vitals/Pain Pain Assessment: Faces Faces Pain Scale: Hurts little more Pain Location: skin wounds Pain Descriptors / Indicators: Grimacing;Tender Pain Intervention(s): Monitored during session;Repositioned    Home Living                      Prior Function            PT Goals (current goals can now be found in the care plan section) Acute Rehab PT Goals Patient Stated Goal: to get home Progress towards PT goals: PT to reassess next treatment    Frequency    Min 2X/week      PT Plan Current plan remains appropriate    Co-evaluation              AM-PAC PT "6 Clicks" Mobility   Outcome Measure  Help needed turning from your back to your side while in a flat bed without using bedrails?: A Little Help needed moving from lying on your back to sitting on the side of a flat bed without using bedrails?: A Little Help needed moving to and from a bed to a chair (including a wheelchair)?: A Little Help needed standing up from a chair using your arms (e.g., wheelchair or bedside chair)?: A Little Help needed to walk in hospital room?: A Lot Help needed climbing 3-5 steps with a railing? : A Lot 6 Click Score: 16    End of Session   Activity Tolerance: Patient limited by fatigue;Other (comment) (impulsive) Patient left: in chair;with call bell/phone within reach;with chair alarm set;with family/visitor present Nurse Communication: Mobility status PT Visit Diagnosis: Unsteadiness on feet (R26.81);Other abnormalities of gait and mobility (R26.89);History of falling (Z91.81)     Time: 0459-9774 PT Time Calculation (min) (ACUTE ONLY): 44 min  Charges:  $Gait Training: 8-22 mins $Therapeutic Exercise: 8-22 mins $Therapeutic Activity: 8-22 mins                  Ramond Dial 05/23/2021, 5:58 PM  Mee Hives, PT MS Acute Rehab Dept. Number: [place]

## 2021-05-23 NOTE — Progress Notes (Addendum)
Progress Note  Patient Name: [name] Date of Encounter: 05/23/2021  Primary Cardiologist: New to [facility]   Subjective   Denies chest pain or SOB Oriented to name and place Does not think if she has fallen more than once recently  Inpatient Medications    Scheduled Meds:  (feeding supplement) PROSource Plus  30 mL Oral BID BM   bacitracin   Topical BID   calcium-vitamin D  1 tablet Oral Q breakfast   cycloSPORINE  1 drop Both Eyes BID   escitalopram  20 mg Oral q AM   feeding supplement  1 Container Oral TID BM   folic acid  1 mg Oral Daily   furosemide  20 mg Oral Daily   losartan  25 mg Oral Daily   metoprolol succinate  100 mg Oral q AM   multivitamin with minerals  1 tablet Oral Daily   thiamine  100 mg Oral q AM   vitamin B-12  1,000 mcg Oral Daily   Continuous Infusions:  PRN Meds: acetaminophen, lidocaine, loperamide   Vital Signs    Vitals:   05/22/21 1348 05/22/21 2011 05/23/21 0513 05/23/21 0954  BP: (!) 141/84 128/72 (!) 143/89 124/78  Pulse: 73 72 84 67  Resp: 16 17 17 18   Temp: 97.8 F (36.6 C) 98.5 F (36.9 C) (!) 97.5 F (36.4 C) 97.9 F (36.6 C)  TempSrc: Oral Oral Oral Oral  SpO2: 94% 95% 99%   Weight:      Height:        Intake/Output Summary (Last 24 hours) at 05/23/2021 1001 Last data filed at 05/22/2021 1753 Gross per 24 hour  Intake 120 ml  Output 1 ml  Net 119 ml   Filed Weights   05/18/21 1003  Weight: 45 kg    Physical Exam   General: frail, elderly, female in no acute distress Head: Eyes PERRLA, Head normocephalic and atraumatic Lungs: some fine scattered rales bilaterally to auscultation. Heart: HRRR S1 S2, without rub or gallop. No murmur. 4/4 extremity pulses are 2+ & equal. No JVD. Abdomen: Bowel sounds are present, abdomen soft and non-tender without masses or  hernias noted. Msk: Weak strength and tone for age. Extremities: No clubbing, cyanosis or edema.    Skin:  No rashes or lesions noted.multiple  areas ecchymosis, various stages of healing Neuro: Alert and oriented X 2. Psych:  Good affect, responds appropriately  Labs    Chemistry Recent Labs  Lab 05/18/21 1000 05/19/21 0221 05/20/21 0029 05/21/21 0113 05/21/21 2144 05/22/21 0249 05/23/21 0138  NA 143 138   < > 139 137 140 136  K 4.1 4.0   < > 3.1* 3.4* 3.5 3.2*  CL 110 102   < > 106 105 106 100  CO2 15* 17*   < > 19* 22 23 28   GLUCOSE 162* 83   < > 93 108* 85 114*  BUN 22 22   < > 17 14 13 17   CREATININE 1.55* 1.36*   < > 0.91 0.86 0.89 0.89  CALCIUM 8.7* 8.1*   < > 7.9* 8.3* 8.2* 8.3*  PROT 6.4* 5.5*  --  5.1*  --   --   --   ALBUMIN 3.9 3.3*  --  2.7*  --   --   --   AST 116* 118*  --  118*  --   --   --   ALT 43 46*  --  57*  --   --   --  ALKPHOS 85 61  --  57  --   --   --   BILITOT 1.6* 1.3*  --  0.8  --   --   --   GFRNONAA 34* 40*   < > >60 >60 >60 >60  ANIONGAP 18* 19*   < > 14 10 11 8    < > = values in this interval not displayed.     Hematology Recent Labs  Lab 05/22/21 0249 05/22/21 1045 05/23/21 0515  WBC 7.3 8.1 7.1  RBC 3.40* 3.43* 3.65*  HGB 11.1* 11.2* 12.0  HCT 33.8* 34.5* 36.1  MCV 99.4 100.6* 98.9  MCH 32.6 32.7 32.9  MCHC 32.8 32.5 33.2  RDW 15.1 15.2 15.3  PLT 81* 81* 91*    Cardiac Enzymes Troponin I (High Sensitivity)  Date Value Ref Range Status  05/21/2021 731 (HH) <18 ng/L Final    Comment:    CRITICAL VALUE NOTED.  VALUE IS CONSISTENT WITH PREVIOUSLY REPORTED AND CALLED VALUE. (NOTE) Elevated high sensitivity troponin I (hsTnI) values and significant  changes across serial measurements may suggest ACS but many other  chronic and acute conditions are known to elevate hsTnI results.  Refer to the Links section for chest pain algorithms and additional  guidance. Performed at [facility], [place] [address] [place] [address], [place], [place] [address]   05/20/2021 814 (HH) <18 ng/L Final    Comment:    CRITICAL VALUE NOTED.  VALUE IS CONSISTENT WITH PREVIOUSLY REPORTED AND  CALLED VALUE. (NOTE) Elevated high sensitivity troponin I (hsTnI) values and significant  changes across serial measurements may suggest ACS but many other  chronic and acute conditions are known to elevate hsTnI results.  Refer to the Links section for chest pain algorithms and additional  guidance. Performed at [facility], [place] [address] [place], [place] [address]   05/20/2021 1,033 (HH) <18 ng/L Final    Comment:    CRITICAL VALUE NOTED.  VALUE IS CONSISTENT WITH PREVIOUSLY REPORTED AND CALLED VALUE. (NOTE) Elevated high sensitivity troponin I (hsTnI) values and significant  changes across serial measurements may suggest ACS but many other  chronic and acute conditions are known to elevate hsTnI results.  Refer to the Links section for chest pain algorithms and additional  guidance. Performed at [facility], [place] [address] [place], [place] [address]   05/19/2021 745 (HH) <18 ng/L Final    Comment:    CRITICAL VALUE NOTED.  VALUE IS CONSISTENT WITH PREVIOUSLY REPORTED AND CALLED VALUE. (NOTE) Elevated high sensitivity troponin I (hsTnI) values and significant  changes across serial measurements may suggest ACS but many other  chronic and acute conditions are known to elevate hsTnI results.  Refer to the Links section for chest pain algorithms and additional  guidance. Performed at [facility], [place] [address], [place], [place] [address]   05/18/2021 804 (HH) <18 ng/L Final    Comment:    CRITICAL VALUE NOTED.  VALUE IS CONSISTENT WITH PREVIOUSLY REPORTED AND CALLED VALUE. (NOTE) Elevated high sensitivity troponin I (hsTnI) values and significant  changes across serial measurements may suggest ACS but many other  chronic and acute conditions are known to elevate hsTnI results.  Refer to the Links section for chest pain algorithms and additional  guidance. Performed at [facility], [place] [address] [place], [place] [address]   05/18/2021 823  (HH) <18 ng/L Final    Comment:    CRITICAL VALUE NOTED.  VALUE IS  CONSISTENT WITH PREVIOUSLY REPORTED AND CALLED VALUE. (NOTE) Elevated high sensitivity troponin I (hsTnI) values and significant  changes across serial measurements may suggest ACS but many other  chronic and acute conditions are known to elevate hsTnI results.  Refer to the Links section for chest pain algorithms and additional  guidance. Performed at [facility], [place] [address], [place], [place] [address]   05/18/2021 508 (HH) <18 ng/L Final    Comment:    CRITICAL VALUE NOTED.  VALUE IS CONSISTENT WITH PREVIOUSLY REPORTED AND CALLED VALUE. (NOTE) Elevated high sensitivity troponin I (hsTnI) values and significant  changes across serial measurements may suggest ACS but many other  chronic and acute conditions are known to elevate hsTnI results.  Refer to the Links section for chest pain algorithms and additional  guidance. Performed at [facility], [place] [address] [place], [place] [address]   05/18/2021 115 (HH) <18 ng/L Final    Comment:    CRITICAL RESULT CALLED TO, READ BACK BY AND VERIFIED WITH: S.BERTRAND RN @ 1423 05/18/2021 BY C.EDENS (NOTE) Elevated high sensitivity troponin I (hsTnI) values and significant  changes across serial measurements may suggest ACS but many other  chronic and acute conditions are known to elevate hsTnI results.  Refer to the Links section for chest pain algorithms and additional  guidance. Performed at [facility], [place] [address], [place], [place] [address]   05/18/2021 45 (H) <18 ng/L Final    Comment:    (NOTE) Elevated high sensitivity troponin I (hsTnI) values and significant  changes across serial measurements may suggest ACS but many other  chronic and acute conditions are known to elevate hsTnI results.  Refer to the Links section for chest pain algorithms and additional  guidance. Performed at [facility], [place] [address],  [place], [place] [address]    Total CK  Date Value Ref Range Status  05/23/2021 768 (H) 38 - 234 U/L Final    Comment:    Performed at [place] [address] [place], [place] [address]  05/22/2021 1,153 (H) 38 - 234 U/L Final    Comment:    Performed at [facility], [place] [address] [place], [place] [address]  05/21/2021 1,886 (H) 38 - 234 U/L Final    Comment:    Performed at [facility], [place] [address] [place], [place] [address]  05/20/2021 3,616 (H) 38 - 234 U/L Final    Comment:    Performed at [facility], [place] [address] [place], [place] [address]  05/19/2021 3,028 (H) 38 - 234 U/L Final    Comment:    Performed at [facility], [place] [address] [place], [place] [address]  05/18/2021 1,071 (H) 38 - 234 U/L Final    Comment:    RESULTS CONFIRMED BY MANUAL DILUTION Performed at [facility], [place] [address], [place], [place] [address]    Lab Results  Component Value Date   TSH 7.048 (H) 05/19/2021   Lab Results  Component Value Date   CHOL 181 05/20/2021   HDL 105 05/20/2021   LDLCALC 35 05/20/2021   TRIG 204 (H) 05/20/2021   CHOLHDL 1.7 05/20/2021   Lab Results  Component Value Date   HGBA1C 5.0 05/19/2021    BNPNo results for input(s): BNP, PROBNP in the last 168 hours.   Radiology    DG CHEST PORT 1 VIEW  Result Date: 05/21/2021  CLINICAL DATA:  Chest pain.  Recent fall. EXAM: PORTABLE CHEST 1 VIEW COMPARISON:  Radiograph and chest CT 05/18/2021 FINDINGS: Lower lung volumes from prior exam. Stable heart size and mediastinal contours. No element of bilateral perihilar and infrahilar opacities from prior exam. Probable bilateral pleural effusions. No pneumothorax. Left-sided rib fractures again seen. IMPRESSION: 1. Lower lung volumes from prior exam with new bilateral perihilar and infrahilar opacities. Differential considerations include pulmonary edema or multifocal infection. Recommend correlation for aspiration risk  factors. 2. Probable bilateral pleural effusions. 3. Left rib fractures. Electronically Signed   By: Keith Rake M.D.   On: 05/21/2021 19:35    Telemetry    SR, occ PVCs- Personally Reviewed  ECG    No new tracing 05/23/21 - Personally Reviewed  Cardiac Studies   Echocardiogram 05/19/21:   1. The entire mid ventricle to apex is akinetic. 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 . Left ventricular ejection fraction, by estimation, is 35%. The left ventricle has moderately decreased function. The left ventricle demonstrates regional wall motion abnormalities (see scoring diagram/findings for description). Left ventricular diastolic parameters are indeterminate.   2. Right ventricular systolic function is normal. The right ventricular  size is normal.   3. The mitral valve is normal in structure. Mild to moderate mitral valve regurgitation.   4. The aortic valve is tricuspid. Aortic valve regurgitation is mild. No  aortic stenosis is present.   5. The inferior vena cava is normal in size with greater than 50%  respiratory variability, suggesting right atrial pressure of 3 mmHg.   6. Cannot exclude small pfo with left to right shunt.   Patient Profile     80 y.o. 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 Cardiology is consulted for elevated troponin and decreased EF.   Assessment & Plan    1. HFrEF: - EF 35% by echo - SOB after volume resuscitation rx w/ IV Lasix >> improved - I/O + 3.2 L but are very incomplete - add daily wts - hold off on ischemic eval since DAPT risky in pt w/ falls - on Toprol XL 100 mg, losartan 25 mg - Lasix 20 mg IV x 1 >> 20 mg po qd - K+ supp ordered - feel BP will tolerate a little more, discuss spiro vs Entresto and/or Imdur w/ MD   2. Mild to mod MR/mild AR: - no obvious murmur, no sx - follow   3. CKD stage III: - Cr 1.36 on admit, may be 2nd dehydration  >> improved   4. ETOH disorder: - found down after > 12 hr - dtr reports years of ETOH abuse - PT has seen, SNF recommended - Psych has seen, dx adjustment disorder w/ depressed mood, Lexapro increased 20 mg qd, f/u outpt - per IM  5. NSTEMI - Peak troponin 1033, in the setting of CK > 3,000. 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 - With significant fall and ongoing fall risk, DAPT not advised  Rosaria Ferries, PA-C 05/23/2021 10:38 AM   For questions or updates, please contact   Please consult [email or web address] for contact info under Cardiology/STEMI.   Patient seen and examined with Rosaria Ferries, PA-C.  Agree as above, with the following exceptions and changes as noted below.  Patient is awake alert and sitting  in the bedside chair with her 2 daughters at the bedside.  She asks appropriate questions but sometimes has to have the answer repeated for understanding.  Currently thrombocytopenic which may be related to significant alcohol use.  Discussed optimal timing of ischemic evaluation which would likely be 4 to 6 weeks after hospital dismissal.  She lives in [place] and would like to see a cardiologist there.  Gen: NAD, CV: RRR, no murmurs, Lungs: clear, Abd: soft, Extrem: Warm, well perfused, no edema, Neuro/Psych: alert and oriented x 3, normal mood and affect. All available labs, radiology testing, previous records reviewed.  ECG is abnormal but cannot exclude stress cardiomyopathy or even alcoholic cardiomyopathy.  With thrombocytopenia would prefer not to pursue ischemic work-up inpatient, and her daughters agree completely.  She may do well with a high quality nuclear study at [facility] on the D SPECT camera if ischemia needs to be excluded.  We will plan for a limited echo 4 to 6 weeks from now.  Agree with transition to oral Lasix, she is euvolemic and not short of breath.  She is  already on losartan 25 mg daily and metoprolol succinate 100 mg daily.  We will add spironolactone 25 mg daily, can titrate as needed for hypertension.  I doubt she will tolerate much more in the way of heart failure therapy given frail status and relative normotension right now.  Meds can be further titrated prior to discharge or at follow-up after echo.  Cardiology will sign off at this time.  Follow-up has been arranged in our [facility] cardiology practice per patient preference.  Elouise Munroe, MD 05/23/21 12:02 PM

## 2021-05-24 LAB — BASIC METABOLIC PANEL
Anion gap: 9 (ref 5–15)
Anion gap: 9 (ref 5–15)
BUN: 17 mg/dL (ref 8–23)
BUN: 16 mg/dL (ref 8–23)
CO2: 28 mmol/L (ref 22–32)
CO2: 25 mmol/L (ref 22–32)
Calcium: 8.6 mg/dL — ABNORMAL LOW (ref 8.9–10.3)
Calcium: 8.8 mg/dL — ABNORMAL LOW (ref 8.9–10.3)
Chloride: 98 mmol/L (ref 98–111)
Chloride: 100 mmol/L (ref 98–111)
Creatinine, Ser: 0.84 mg/dL (ref 0.44–1.00)
Creatinine, Ser: 0.75 mg/dL (ref 0.44–1.00)
GFR, Estimated: >60 mL/min (ref >60)
GFR, Estimated: >60 mL/min (ref >60)
Glucose, Bld: 89 mg/dL (ref 70–99)
Glucose, Bld: 90 mg/dL (ref 70–99)
Potassium: 3 mmol/L — ABNORMAL LOW (ref 3.5–5.1)
Potassium: 3.8 mmol/L (ref 3.5–5.1)
Sodium: 135 mmol/L (ref 135–145)
Sodium: 134 mmol/L — ABNORMAL LOW (ref 135–145)

## 2021-05-24 LAB — CBC
HCT: 32.1 % — ABNORMAL LOW (ref 36.0–46.0)
Hemoglobin: 10.9 g/dL — ABNORMAL LOW (ref 12.0–15.0)
MCH: 33.2 pg (ref 26.0–34.0)
MCHC: 34 g/dL (ref 30.0–36.0)
MCV: 97.9 fL (ref 80.0–100.0)
Platelets: 128 10*3/uL — ABNORMAL LOW (ref 150–400)
RBC: 3.28 MIL/uL — ABNORMAL LOW (ref 3.87–5.11)
RDW: 15.3 % (ref 11.5–15.5)
WBC: 6.8 10*3/uL (ref 4.0–10.5)
nRBC: 0 % (ref 0.0–0.2)

## 2021-05-24 LAB — GLUCOSE, CAPILLARY
Glucose-Capillary: 80 mg/dL (ref 70–99)
Glucose-Capillary: 89 mg/dL (ref 70–99)
Glucose-Capillary: 94 mg/dL (ref 70–99)
Glucose-Capillary: 95 mg/dL (ref 70–99)

## 2021-05-24 LAB — MAGNESIUM: Magnesium: 1.7 mg/dL (ref 1.7–2.4)

## 2021-05-24 MED ORDER — METOPROLOL SUCCINATE ER 100 MG PO TB24: 100.0000 mg | ORAL_TABLET | Freq: Every morning | ORAL | Status: DC

## 2021-05-24 MED ORDER — FOLIC ACID 1 MG PO TABS: 1.0000 mg | ORAL_TABLET | Freq: Every day | ORAL | Status: AC

## 2021-05-24 MED ORDER — ENOXAPARIN SODIUM 30 MG/0.3ML IJ SOSY
30.0000 mg | PREFILLED_SYRINGE | INTRAMUSCULAR | Status: DC
  Administered 2021-05-24: 30 mg via SUBCUTANEOUS
  Filled 2021-05-24: qty 0.3

## 2021-05-24 MED ORDER — ESCITALOPRAM OXALATE 20 MG PO TABS: 20.0000 mg | ORAL_TABLET | Freq: Every morning | ORAL | Status: AC

## 2021-05-24 MED ORDER — SPIRONOLACTONE 25 MG PO TABS: 25.0000 mg | ORAL_TABLET | Freq: Every day | ORAL | Status: DC

## 2021-05-24 MED ORDER — POTASSIUM CHLORIDE 20 MEQ PO PACK
40.0000 meq | PACK | Freq: Two times a day (BID) | ORAL | Status: DC
  Administered 2021-05-24: 40 meq via ORAL
  Filled 2021-05-24: qty 2

## 2021-05-24 MED ORDER — LOSARTAN POTASSIUM 25 MG PO TABS: 25.0000 mg | ORAL_TABLET | Freq: Every day | ORAL | Status: DC

## 2021-05-24 NOTE — Discharge Summary (Signed)
[facility] Discharge Summary  Patient name: [name] Medical record number: [id number] Date of birth: 09/10/1941 Age: 80 y.o. Gender: female Date of Admission: 05/18/2021  Date of Discharge: 05/24/2021 Admitting Physician: Delora Fuel, MD  Primary Care Provider: Asencion Noble, MD Consultants: Cardiology  Indication for Hospitalization: Fall  Discharge Diagnoses/Problem List:  Patient Active Problem List   Diagnosis Date Noted   Regional wall motion abnormality of heart 05/22/2021   Malnutrition of moderate degree 05/22/2021   Cardiomyopathy, alcoholic (HCC)    Acute on chronic systolic heart failure (HCC)    Protein-calorie malnutrition, severe ([place]) 05/21/2021   Cognitive impairment    Mitral valve regurgitation, moderate 05/20/2021   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[place])    Generalized weakness 05/19/2021   Rhabdomyolysis 84/16/6063   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[place]) 05/19/2021   Atherosclerosis of aorta ([place]) 05/19/2021   Multiple skin tears 05/19/2021   Multiple Bruising sites 05/19/2021   Poor historian 05/19/2021   Acute renal injury ([place]) 05/19/2021   Transaminitis 05/19/2021   Bilirubinemia 05/19/2021   Hypoproteinemia ([place]) 05/19/2021   Elevated troponin 05/19/2021   Vitamin B12 deficiency 05/19/2021   Macrocytic anemia 05/19/2021   Thrombocytopenia ([place]) 05/19/2021   TSH elevation 05/19/2021   Pyuria 05/19/2021   Hematuria 05/19/2021   Dehydration 05/19/2021   Cerebral atrophy ([place]) 05/19/2021   Degenerative disc disease, cervical 05/19/2021   Bilateral Periorbital ecchymosis 05/19/2021   Myocardial injury    Elevated CK    Fall 05/18/2021   Multiple closed fractures of ribs of left side 05/18/2021    Disposition: SNF  Discharge Condition: Stable, improved  Discharge Exam:  General: NAD, supine in bed, resting comfortably Cardiovascular: RRR Respiratory: Breathing comfortably on room air,  no increased work of breathing, clear to auscultation bilaterally Abdomen: Soft, nontender, nondistended, bowel sounds present Extremities: No peripheral edema noted, moving all extremities equally and appropriately Integument: Multiple areas of ecchymosis in different stages of healing with pictures from admission.  Brief Hospital Course:  [name] is a 80 y.o. female presenting with Fall. 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               Fall  Dehydration I Leg wounds  rhabdomyolysis Patient was found down by daughter on the floor after unknown events with a history of falls in the setting of chronic alcoholism.  Patient was initially hypertensive and tachycardic and found to have an elevated CK, patient was given fluid resuscitation with improvement in tachycardia.  Significant bruising was noted over patient's body, some of which were in old healing stages.  Head CT, CT A&P, CT cervical spine without acute abnormalities.  Patient lives alone and family was concerned about her care and safety at home.  Palliative care was involved to define goals of care; patient was evaluated PT/OT with a suggestion for SNF placement.  Psychiatry was consulted as there was concern about patient's capacity to make medical decisions, it was deemed the patient did not have capacity and family wanted her placed at SNF.  Prior to discharge, bruising was improving significantly.   Alcohol Use Disorder  Patient was on CIWA protocol with concern for alcohol withdrawal given patient's significant alcohol intake of 3 bottles of wine per day.  Patient did not require any Ativan during hospitalization.  Macrocytic Anemia  Thrombocytopenia Patient was  thrombocytopenic to 68 on admission with a stable hemoglobin.   Chemical prophylaxis was initially held during admission but was reinitiated once improved to > 100.  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  Cardiology was consulted and echocardiogram was obtained, which showed entire mid ventricle to apex is akinetic, LVEF 35%, mild to moderate mitral valve regurg, cannot exclude small PFO with left to right shunt.  Patient became short of breath after fluid resuscitation in the setting of rhabdomyolysis.  ECG noted to have T wave inversions, cardiology held off on invasive ischemic evaluation as patient is not an optimal candidate due to alcohol abuse and frequent falls (in case patient needed intervention that would require long-term DAPT).  While in the hospital, patient was started on losartan 20 mg daily, oral Lasix 20 mg daily.  Elevated TSH  subclinical hypothyroidism Patient had TSH of 7.048. T4 was obtained and it is 0.66.Repeat TSH as an outpatient.   Left Rib fracture Patient complains of mild pain with position change but no pain at rest and denies using any medication. Received Lidocaine patch PRN and Tylenol 650 q8h PRN  Hypokalemia Patient with persistent hypokalemia in the setting of new onset heart failure.  Potassium was initially within normal limits on admission, but that was in setting of rhabdomyolysis and may have been falsely elevated.  Patient presented with diarrhea prior to admission and could have possibly contributed.  Not currently on diuretics and potassium sparing medications, will need repeat and monitoring outpatient.   Issues for follow-up: Recheck BMP in the next 1-2 days to monitor potassium.  Patiently recently started on Lasix, losartan, spironolactone Can titrate to spironolactone 25 mg up as needed for hypertension. Patient to have limited echo 4 to 6 weeks from hospitalization with  cardiology May consider high-quality nuclear study for ischemia evaluation if necessary from cardiology Continue cessation counseling for alcohol Consider initiation of acamprosate if patient willing. Repeat TSH in 6-8 weeks.   Significant Procedures: None  Significant Labs and Imaging:  Recent Labs  Lab 05/22/21 1045 05/23/21 0515 05/24/21 0248  WBC 8.1 7.1 6.8  HGB 11.2* 12.0 10.9*  HCT 34.5* 36.1 32.1*  PLT 81* 91* 128*   Recent Labs  Lab 05/18/21 1000 05/19/21 0221 05/20/21 0029 05/21/21 0113 05/21/21 2144 05/22/21 0249 05/23/21 0138 05/24/21 0248  NA 143 138 138 139 137 140 136 135  K 4.1 4.0 4.0 3.1* 3.4* 3.5 3.2* 3.0*  CL 110 102 108 106 105 106 100 98  CO2 15* 17* 17* 19* 22 23 28 28   GLUCOSE 162* 83 66* 93 108* 85 114* 89  BUN 22 22 22 17 14 13 17 17   CREATININE 1.55* 1.36* 1.26* 0.91 0.86 0.89 0.89 0.84  CALCIUM 8.7* 8.1* 7.5* 7.9* 8.3* 8.2* 8.3* 8.6*  MG  --   --  2.0  --   --   --   --   --   ALKPHOS 85 61  --  57  --   --   --   --   AST 116* 118*  --  118*  --   --   --   --   ALT 43 46*  --  57*  --   --   --   --   ALBUMIN 3.9 3.3*  --  2.7*  --   --   --   --  CT Head Wo Contrast  Result Date: 05/18/2021 CLINICAL DATA:  Patient status post fall. EXAM: CT HEAD WITHOUT CONTRAST CT CERVICAL SPINE WITHOUT CONTRAST TECHNIQUE: Multidetector CT imaging of the head and cervical spine was performed following the standard protocol without intravenous contrast. Multiplanar CT image reconstructions of the cervical spine were also generated. COMPARISON:  Brain CT 09/03/2020. FINDINGS: CT HEAD FINDINGS Brain: Ventricles and sulci are appropriate for patient's age. No evidence for acute cortically based infarct, intracranial hemorrhage, mass lesion or mass-effect. Vascular: Unremarkable. Skull: No aggressive or acute appearing osseous lesions. Sinuses/Orbits: Paranasal sinuses are well aerated. Mastoid air cells are unremarkable. Orbits are unremarkable. Other:  None. CT CERVICAL SPINE FINDINGS Alignment: Normal anatomic alignment. Skull base and vertebrae: Intact. Soft tissues and spinal canal: No prevertebral fluid or swelling. No visible canal hematoma. Disc levels: Multilevel degenerative disc disease most pronounced C5-6. No acute fracture. Upper chest: Postsurgical changes right lung apex. Other: None. IMPRESSION: No acute cervical spine fracture. No acute intracranial process. Electronically Signed   By: Lovey Newcomer M.D.   On: 05/18/2021 11:59   CT Cervical Spine Wo Contrast  Result Date: 05/18/2021 CLINICAL DATA:  Patient status post fall. EXAM: CT HEAD WITHOUT CONTRAST CT CERVICAL SPINE WITHOUT CONTRAST TECHNIQUE: Multidetector CT imaging of the head and cervical spine was performed following the standard protocol without intravenous contrast. Multiplanar CT image reconstructions of the cervical spine were also generated. COMPARISON:  Brain CT 09/03/2020. FINDINGS: CT HEAD FINDINGS Brain: Ventricles and sulci are appropriate for patient's age. No evidence for acute cortically based infarct, intracranial hemorrhage, mass lesion or mass-effect. Vascular: Unremarkable. Skull: No aggressive or acute appearing osseous lesions. Sinuses/Orbits: Paranasal sinuses are well aerated. Mastoid air cells are unremarkable. Orbits are unremarkable. Other: None. CT CERVICAL SPINE FINDINGS Alignment: Normal anatomic alignment. Skull base and vertebrae: Intact. Soft tissues and spinal canal: No prevertebral fluid or swelling. No visible canal hematoma. Disc levels: Multilevel degenerative disc disease most pronounced C5-6. No acute fracture. Upper chest: Postsurgical changes right lung apex. Other: None. IMPRESSION: No acute cervical spine fracture. No acute intracranial process. Electronically Signed   By: Lovey Newcomer M.D.   On: 05/18/2021 11:59   DG Pelvis Portable  Result Date: 05/18/2021 CLINICAL DATA:  Found on floor EXAM: PORTABLE PELVIS 1-2 VIEWS COMPARISON:   12/18/2019 FINDINGS: There is no evidence of pelvic fracture or diastasis. No pelvic bone lesions are seen. Lumbar scoliosis and degenerative change. Surgical staples in the right lower quadrant. IMPRESSION: Negative. Electronically Signed   By: Franchot Gallo M.D.   On: 05/18/2021 10:44   CT CHEST ABDOMEN PELVIS W CONTRAST  Result Date: 05/18/2021 CLINICAL DATA:  Abdominal trauma. EXAM: CT CHEST, ABDOMEN, AND PELVIS WITH CONTRAST TECHNIQUE: Multidetector CT imaging of the chest, abdomen and pelvis was performed following the standard protocol during bolus administration of intravenous contrast. CONTRAST:  152mL OMNIPAQUE IOHEXOL 300 MG/ML  SOLN COMPARISON:  February 29, 2020 FINDINGS: CT CHEST FINDINGS Cardiovascular: Mildly enlarged heart. Calcific atherosclerotic disease of the coronary arteries and aorta. No central pulmonary embolus. Mediastinum/Nodes: No enlarged mediastinal, hilar, or axillary lymph nodes. Thyroid gland, trachea, and esophagus demonstrate no significant findings. Lungs/Pleura: Lungs are clear. No pleural effusion or pneumothorax. Musculoskeletal: No chest wall mass or suspicious bone lesions identified. CT ABDOMEN PELVIS FINDINGS Hepatobiliary: No hepatic injury or perihepatic hematoma. Gallbladder is unremarkable. Hepatic steatosis. Pancreas: Unremarkable. No pancreatic ductal dilatation or surrounding inflammatory changes. Spleen: No splenic injury or perisplenic hematoma. Adrenals/Urinary Tract: Normal adrenal glands. Bilateral nonobstructive renal  calculi measuring up to 7 mm. 2.7 cm left renal cyst. Normal urinary bladder. Stomach/Bowel: Stomach is within normal limits. No evidence of bowel wall thickening, distention, or inflammatory changes. Vascular/Lymphatic: Aortic atherosclerosis and tortuosity. No enlarged abdominal or pelvic lymph nodes. Reproductive: Status post hysterectomy. No adnexal masses. Other: Fat containing periumbilical anterior abdominal wall hernia. Second fat and  bowel containing infraumbilical anterior abdominal wall hernia. No CT evidence of bowel incarceration. Postsurgical changes in the abdomen. Musculoskeletal: No fracture is seen. Scoliosis and spondylosis of the spine. IMPRESSION: 1. No evidence of acute traumatic injury to the chest, abdomen or pelvis. 2. Hepatic steatosis. 3. Bilateral nonobstructive renal calculi. 4. Fat containing periumbilical anterior abdominal wall hernia. 5. Second fat and bowel containing infraumbilical anterior abdominal wall hernia. No CT evidence of bowel incarceration. 6. Aortic atherosclerosis. Aortic Atherosclerosis (ICD10-I70.0). Electronically Signed   By: Fidela Salisbury M.D.   On: 05/18/2021 12:25   DG Chest Portable 1 View  Result Date: 05/18/2021 CLINICAL DATA:  Found on floor.  Fall EXAM: PORTABLE CHEST 1 VIEW COMPARISON:  03/02/2020 FINDINGS: Heart size and mediastinal structures normal. Vascularity normal. Lungs clear without infiltrate effusion. Surgical staples right lung apex. Left rib fractures are present which are likely acute and not seen previously. IMPRESSION: Left rib fractures. No acute cardiopulmonary abnormality. Electronically Signed   By: Franchot Gallo M.D.   On: 05/18/2021 10:35   ECHOCARDIOGRAM COMPLETE  Result Date: 05/19/2021    ECHOCARDIOGRAM REPORT   Patient Name:   WESLYNN KE Date of Exam: 05/19/2021 Medical Rec #:  [id number]        Height:       60.0 in Accession #:    [id number]       Weight:       99.2 lb Date of Birth:  May 01, 1941       BSA:          1.386 m Patient Age:    35 years         BP:           120/94 mmHg Patient Gender: F                HR:           88 bpm. Exam Location:  Inpatient Procedure: 2D Echo, Cardiac Doppler and Color Doppler Indications:    Elevated troponin  History:        Patient has no prior history of Echocardiogram examinations.  Sonographer:    Cammy Brochure Referring Phys: [place]  1. The entire mid ventricle to apex is  akinetic. 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 . Left ventricular ejection fraction, by estimation, is 35%. The left ventricle has moderately decreased function. The left ventricle demonstrates regional wall motion abnormalities (see scoring diagram/findings for description). Left ventricular diastolic parameters are indeterminate.  2. Right ventricular systolic function is normal. The right ventricular size is normal.  3. The mitral valve is normal in structure. Mild to moderate mitral valve regurgitation.  4. The aortic valve is tricuspid. Aortic valve regurgitation is mild. No aortic stenosis is present.  5. The inferior vena cava is normal in size with greater than 50% respiratory variability, suggesting right atrial pressure of 3 mmHg.  6. Cannot exclude small pfo with left to right shunt. FINDINGS  Left Ventricle: The entire mid ventricle to apex is akinetic. 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 Left ventricular ejection fraction, by estimation, is 35%.  The left ventricle has moderately decreased function. The left ventricle demonstrates regional wall motion abnormalities. The left ventricular internal cavity size was normal in size. There is no left ventricular hypertrophy. Left ventricular diastolic parameters are indeterminate. Normal left ventricular filling pressure. Right Ventricle: The right ventricular size is normal. No increase in right ventricular wall thickness. Right ventricular systolic function is normal. Left Atrium: Left atrial size was normal in size. Right Atrium: Right atrial size was normal in size. Pericardium: There is no evidence of pericardial effusion. Mitral Valve: The mitral valve is normal in structure. Mild to moderate mitral valve regurgitation. Tricuspid Valve: The tricuspid valve is normal in structure. Tricuspid valve regurgitation is mild . No evidence of tricuspid stenosis. Aortic Valve: The aortic valve  is tricuspid. Aortic valve regurgitation is mild. No aortic stenosis is present. Aortic valve mean gradient measures 2.0 mmHg. Aortic valve peak gradient measures 3.3 mmHg. Aortic valve area, by VTI measures 2.09 cm. Pulmonic Valve: The pulmonic valve was not well visualized. Pulmonic valve regurgitation is not visualized. No evidence of pulmonic stenosis. Aorta: The aortic root is normal in size and structure. Pulmonary Artery: Mild pulmonary HTN, PASP is 36 mmHg. Venous: The inferior vena cava is normal in size with greater than 50% respiratory variability, suggesting right atrial pressure of 3 mmHg. IAS/Shunts: Cannot exclude small pfo with left to right shunt.  LEFT VENTRICLE PLAX 2D LVIDd:         3.90 cm  Diastology LVIDs:         2.50 cm  LV e' medial:    5.98 cm/s LV PW:         0.90 cm  LV E/e' medial:  9.6 LV IVS:        1.00 cm  LV e' lateral:   5.87 cm/s LVOT diam:     2.00 cm  LV E/e' lateral: 9.8 LV SV:         36 LV SV Index:   26 LVOT Area:     3.14 cm  RIGHT VENTRICLE            IVC RV Basal diam:  2.90 cm    IVC diam: 1.40 cm RV S prime:     9.36 cm/s TAPSE (M-mode): 1.9 cm LEFT ATRIUM             Index       RIGHT ATRIUM           Index LA diam:        2.60 cm 1.88 cm/m  RA Area:     10.76 cm LA Vol (A2C):   33.9 ml 24.47 ml/m RA Volume:   24.25 ml  17.50 ml/m LA Vol (A4C):   35.2 ml 25.40 ml/m LA Biplane Vol: 34.5 ml 24.90 ml/m  AORTIC VALVE AV Area (Vmax):    2.31 cm AV Area (Vmean):   2.14 cm AV Area (VTI):     2.09 cm AV Vmax:           91.20 cm/s AV Vmean:          60.800 cm/s AV VTI:            0.173 m AV Peak Grad:      3.3 mmHg AV Mean Grad:      2.0 mmHg LVOT Vmax:         67.10 cm/s LVOT Vmean:        41.400 cm/s LVOT VTI:  0.115 m LVOT/AV VTI ratio: 0.66  AORTA Ao Root diam: 2.70 cm Ao Asc diam:  2.70 cm MITRAL VALVE               TRICUSPID VALVE MV Area (PHT): 4.89 cm    TR Peak grad:   32.5 mmHg MV Decel Time: 155 msec    TR Vmax:        285.00 cm/s MV E velocity:  57.50 cm/s MV A velocity: 75.80 cm/s  SHUNTS MV E/A ratio:  0.76        Systemic VTI:  0.12 m                            Systemic Diam: 2.00 cm Carlyle Dolly MD Electronically signed by Carlyle Dolly MD Signature Date/Time: 05/19/2021/2:41:19 PM    Final      Results/Tests Pending at Time of Discharge: None  Discharge Medications:  Allergies as of 05/24/2021       Reactions   Clindamycin/lincomycin Rash   Doxycycline Rash        Medication List     STOP taking these medications    potassium chloride 10 MEQ tablet Commonly known as: KLOR-CON       TAKE these medications    CALCIUM + D3 PO Take 1 tablet by mouth daily with breakfast.   cyanocobalamin 1000 MCG/ML injection Commonly known as: (VITAMIN B-12) Inject 1,000 mcg into the skin every 30 (thirty) days. What changed: Another medication with the same name was removed. Continue taking this medication, and follow the directions you see here.   denosumab 60 MG/ML Sosy injection Commonly known as: PROLIA Inject 60 mg into the skin every 6 (six) months.   escitalopram 20 MG tablet Commonly known as: LEXAPRO Take 1 tablet (20 mg total) by mouth in the morning. Start taking on: May 25, 2021 What changed:  medication strength how much to take   folic acid 1 MG tablet Commonly known as: FOLVITE Take 1 tablet (1 mg total) by mouth daily. Start taking on: May 25, 2021   losartan 25 MG tablet Commonly known as: COZAAR Take 1 tablet (25 mg total) by mouth daily. Start taking on: May 25, 2021   metoprolol succinate 100 MG 24 hr tablet Commonly known as: TOPROL-XL Take 1 tablet (100 mg total) by mouth in the morning. Take with or immediately following a meal. Start taking on: May 25, 2021 What changed:  medication strength how much to take additional instructions   Restasis 0.05 % ophthalmic emulsion Generic drug: cycloSPORINE Place 1 drop into both eyes 2 (two) times daily.   spironolactone 25 MG  tablet Commonly known as: ALDACTONE Take 1 tablet (25 mg total) by mouth daily. Start taking on: May 25, 2021   thiamine 100 MG tablet Take 100 mg by mouth in the morning.               Durable Medical Equipment  (From admission, onward)           Start     Ordered   05/19/21 1646  For home use only DME 3 n 1  Once        05/19/21 1645   05/19/21 1645  For home use only DME Walker rolling  Once       Question Answer Comment  Walker: With 5 Inch Wheels   Patient needs a walker to treat with the following condition Fall  05/19/21 1645            Discharge Instructions: Please refer to Patient Instructions section of EMR for full details.  Patient was counseled important signs and symptoms that should prompt return to medical care, changes in medications, dietary instructions, activity restrictions, and follow up appointments.   Follow-Up Appointments:  Follow-up Information     Asencion Noble, MD. Schedule an appointment as soon as possible for a visit in 5 day(s).   Specialty: Internal Medicine Contact information: [place] [address] [phone]                 Rise Patience, DO 05/24/2021, 3:03 PM PGY-1, [place]

## 2021-05-24 NOTE — Progress Notes (Signed)
Physical Therapy Treatment Patient Details Name: [name] MRN: [id number] DOB: [birth year] Today's Date: 05/24/2021    History of Present Illness [name] is a 80 y.o. female presenting with Fall; found down, likely for at least 12 hours; extensive bruising, an drhabdomyolysis; PMH is significant for Anxiety, Arthritis, Alcohol Use Disorder, Carcinoid bronchial adenoma of right lung ([place]), Depression, GERD, Kidney stones, Liver disease, Osteoporosis, Subarachnoid Hemmorage, Macrocytic Anemia, & Thrombocytopenia.    PT Comments    Pt was seen for mobility on RW today, after using device in her room, and was able to translate into a longer hallway trip.  Her plan is to walk with less assistance after being a SNF resident, and is leaving today for that care.  Has been decided that family will take her to avoid a lengthy wait into the evening.  Follow along with her to get  gait and transfers to safer level, as well as monitoring for her ongoing need for followup care which may include ALF.   Follow Up Recommendations  SNF     Equipment Recommendations  Rolling walker with 5" wheels;3in1 (PT)    Recommendations for Other Services       Precautions / Restrictions Precautions Precautions: Fall Precaution Comments: mult open wounds Restrictions Weight Bearing Restrictions: No    Mobility  Bed Mobility               General bed mobility comments: up inchair    Transfers Overall transfer level: Needs assistance Equipment used: 1 person hand held assist Transfers: Sit to/from Stand Sit to Stand: Min assist         General transfer comment: pt is more agreeable to place hands on chair and push up in a sequence  Ambulation/Gait Ambulation/Gait assistance: Min guard;Min assist Gait Distance (Feet): 120 Feet Assistive device: Rolling walker (2 wheeled);1 person hand held assist Gait Pattern/deviations: Step-through pattern;Decreased stride length;Wide base of  support;Drifts right/left Gait velocity: reduced   General Gait Details: on hallway pt is more able to manage turns but continues to neglect the awareness of her safety to avoid obstacles unless cued   Stairs             Wheelchair Mobility    Modified Rankin (Stroke Patients Only)       Balance Overall balance assessment: Needs assistance Sitting-balance support: Feet supported Sitting balance-Leahy Scale: Fair     Standing balance support: Bilateral upper extremity supported Standing balance-Leahy Scale: Poor                              Cognition Arousal/Alertness: Awake/alert Behavior During Therapy: Anxious Overall Cognitive Status: Impaired/Different from baseline Area of Impairment: Problem solving;Awareness;Memory                   Current Attention Level: Selective Memory: Decreased short-term memory Following Commands: Follows one step commands inconsistently Safety/Judgement: Decreased awareness of safety Awareness: Intellectual Problem Solving: Slow processing General Comments: pt was more agreeable with PT today, and used a different RW with her from hosp since pt's is broken      Exercises      General Comments General comments (skin integrity, edema, etc.): pt asking when her bandages can be removed, but is more comfortable wth touch on her arms      Pertinent Vitals/Pain Pain Assessment: Faces Faces Pain Scale: Hurts a little bit Pain Location: skin wounds Pain Descriptors / Indicators: Guarding  Pain Intervention(s): Monitored during session;Repositioned    Home Living                      Prior Function            PT Goals (current goals can now be found in the care plan section) Acute Rehab PT Goals Patient Stated Goal: to get home Progress towards PT goals: Progressing toward goals    Frequency    Min 2X/week      PT Plan Current plan remains appropriate    Co-evaluation               AM-PAC PT "6 Clicks" Mobility   Outcome Measure  Help needed turning from your back to your side while in a flat bed without using bedrails?: A Little Help needed moving from lying on your back to sitting on the side of a flat bed without using bedrails?: A Little Help needed moving to and from a bed to a chair (including a wheelchair)?: A Little Help needed standing up from a chair using your arms (e.g., wheelchair or bedside chair)?: A Little Help needed to walk in hospital room?: A Little Help needed climbing 3-5 steps with a railing? : A Lot 6 Click Score: 17    End of Session   Activity Tolerance: Patient limited by fatigue Patient left: in chair;with call bell/phone within reach;with chair alarm set Nurse Communication: Mobility status PT Visit Diagnosis: Unsteadiness on feet (R26.81);Other abnormalities of gait and mobility (R26.89);History of falling (Z91.81)     Time: 9924-2683 PT Time Calculation (min) (ACUTE ONLY): 23 min  Charges:  $Gait Training: 8-22 mins $Therapeutic Activity: 8-22 mins                   Ramond Dial 05/24/2021, 5:13 PM  Mee Hives, PT MS Acute Rehab Dept. Number: [place]

## 2021-05-24 NOTE — Progress Notes (Addendum)
Family Medicine Teaching Service Daily Progress Note Intern Pager: [phone]  Patient name: [name] Medical record number: [id number] Date of birth: 1941-01-19 Age: 80 y.o. Gender: female  Primary Care Provider: Asencion Noble, MD Consultants: Psych (s/o) Code Status: Full  Pt Overview and Major Events to Date:  6/18 - Admitted to FPTS  Assessment and Plan: [name] is a 80 year old female presenting after fall.  PMH is significant for anxiety, arthritis, alcohol use disorder, carcinoid bronchial adenoma of right lung, depression, GERD, kidney stones, liver disease, osteoporosis, subarachnoid hemorrhage, macrocytic anemia and thrombocytopenia.  Hypokalemia  Persistent hypokalemia, K 3.0 this morning. Due to cardiac conditions, goal K is close to 4. - Replete with 73mEq x2 - Repeat BMP at 1400  Fall  Leg wounds Wounds improving. Palliative care following.  - Palliative following, appreciate their assstance - PT/OT eval and treat - Delirium precautions - Fall precautions  HFrEF  New cardiomyopathy Urine output is not being strictly monitored with only documentation of occurrence. Weight since admission: 45kg>45.2kg. Cardiology not planning any invasive ischemic evaluation. - Cardiology following, appreciate recs - Continue oral Lasix 20mg  dialy - Strict Is&Os - Daily weights - Continue Metoprolol 100mg  and Losartan 25mg  daily  Thrombocytopenia  Macrocytic anemia Platelets 81>91>128.  - Resumed chemical prophylaxis  - Continue to monitor with CBC  Adjustment disorder with depressed mood Psychiatry signed off on 6/23 - Continue Lexapro 20mg  daily  Rhabdomyolysis, improving Patient with last CK downtrending 2831>517. Patient experienced flash pulmonary edema and fluids were held, as it was down-trending we will not trend CK at this time unless clinically indicated or with worsening renal function.    FEN/GI: Regular diet PPx: Lovenox Dispo:SNF in 2-3 days.  Barriers include SNF Placement.   Subjective:  Patient ports she is not having any pain anywhere, but does report that the underarms are very sore today.  She has no other complaints today.  Daughter is concerned that once the patient gets discharged to a SNF if she is deemed to have capacity she will leave.  Objective: Temp:  [97.9 F (36.6 C)-98.6 F (37 C)] 98.6 F (37 C) (06/24 0631) Pulse Rate:  [67-74] 74 (06/24 0631) Resp:  [16-18] 16 (06/24 0631) BP: (113-143)/(69-99) 113/69 (06/24 0631) SpO2:  [96 %-100 %] 96 % (06/24 0631) Weight:  [45.2 kg] 45.2 kg (06/24 0700) Physical Exam: General: NAD, supine in bed, resting comfortably Cardiovascular: RRR Respiratory: Breathing comfortably on room air, no increased work of breathing, clear to auscultation bilaterally Abdomen: Soft, nontender, nondistended, bowel sounds present Extremities: No peripheral edema noted, moving all extremities equally and appropriately Integument: Multiple areas of ecchymosis in different stages of healing with pictures from admission.  Laboratory: Recent Labs  Lab 05/22/21 1045 05/23/21 0515 05/24/21 0248  WBC 8.1 7.1 6.8  HGB 11.2* 12.0 10.9*  HCT 34.5* 36.1 32.1*  PLT 81* 91* 128*   Recent Labs  Lab 05/18/21 1000 05/19/21 0221 05/20/21 0029 05/21/21 0113 05/21/21 2144 05/22/21 0249 05/23/21 0138 05/24/21 0248  NA 143 138   < > 139   < > 140 136 135  K 4.1 4.0   < > 3.1*   < > 3.5 3.2* 3.0*  CL 110 102   < > 106   < > 106 100 98  CO2 15* 17*   < > 19*   < > 23 28 28   BUN 22 22   < > 17   < > 13 17 17   CREATININE 1.55* 1.36*   < >  0.91   < > 0.89 0.89 0.84  CALCIUM 8.7* 8.1*   < > 7.9*   < > 8.2* 8.3* 8.6*  PROT 6.4* 5.5*  --  5.1*  --   --   --   --   BILITOT 1.6* 1.3*  --  0.8  --   --   --   --   ALKPHOS 85 61  --  57  --   --   --   --   ALT 43 46*  --  57*  --   --   --   --   AST 116* 118*  --  118*  --   --   --   --   GLUCOSE 162* 83   < > 93   < > 85 114* 89   < > = values  in this interval not displayed.     Imaging/Diagnostic Tests: No results found.   Rise Patience, DO 05/24/2021, 8:46 AM PGY-1, [place] Intern pager: [phone], text pages welcome

## 2021-05-24 NOTE — TOC Transition Note (Addendum)
Transition of Care [facility]) - CM/SW Discharge Note   Patient Details  Name: [name] MRN: [id number] Date of Birth: [birth year]  Transition of [facility] CM/SW Contact:  Emeterio Reeve, [place] Phone Number: 05/24/2021, 2:45 PM   Clinical Narrative:     Patient will DC to: [facility] Anticipated DC date: 05/24/21 Family notified: [name] and [name] Transport by: Corey Harold     Per MD patient ready for DC to [facility]. RN, patient, patient's family, and facility notified of DC. Discharge Summary and FL2 sent to facility. DC packet on chart. Ambulance transport requested for patient.    RN to call report to [phone].  4:45pm- Family has decided to take pt to [facility] in private car. CSW informed nurse and PT. CSW canceled [facility] transportation.   CSW will sign off for now as social work intervention is no longer needed. Please consult [place] again if new needs arise.   Final next level of care: Skilled Nursing Facility Barriers to Discharge: Barriers Resolved   Patient Goals and CMS Choice        Discharge Placement              Patient chooses bed at: [facility] Patient to be transferred to facility by: [facility] Name of family member notified: Both daughters aware Patient and family notified of of transfer: 05/24/21  Discharge Plan and Services                                     Social Determinants of Health (SDOH) Interventions     Readmission Risk Interventions No flowsheet data found.   Emeterio Reeve, Latanya Presser, [place] Social Worker [phone]

## 2021-05-27 ENCOUNTER — Encounter: Payer: Self-pay | Admitting: Family Medicine

## 2021-05-29 DIAGNOSIS — G9340 Encephalopathy, unspecified: Secondary | ICD-10-CM | POA: Diagnosis not present

## 2021-05-29 DIAGNOSIS — R269 Unspecified abnormalities of gait and mobility: Secondary | ICD-10-CM | POA: Diagnosis not present

## 2021-05-29 DIAGNOSIS — S2232XD Fracture of one rib, left side, subsequent encounter for fracture with routine healing: Secondary | ICD-10-CM | POA: Diagnosis not present

## 2021-05-29 DIAGNOSIS — R531 Weakness: Secondary | ICD-10-CM | POA: Diagnosis not present

## 2021-05-29 DIAGNOSIS — D649 Anemia, unspecified: Secondary | ICD-10-CM | POA: Diagnosis not present

## 2021-05-29 DIAGNOSIS — E46 Unspecified protein-calorie malnutrition: Secondary | ICD-10-CM | POA: Diagnosis not present

## 2021-05-29 DIAGNOSIS — E876 Hypokalemia: Secondary | ICD-10-CM | POA: Diagnosis not present

## 2021-05-29 DIAGNOSIS — I509 Heart failure, unspecified: Secondary | ICD-10-CM | POA: Diagnosis not present

## 2021-06-03 LAB — VITAMIN B1: Vitamin B1 (Thiamine): 123 nmol/L (ref 66.5–200.0)

## 2021-06-12 DIAGNOSIS — I5032 Chronic diastolic (congestive) heart failure: Secondary | ICD-10-CM | POA: Diagnosis not present

## 2021-06-12 DIAGNOSIS — I5020 Unspecified systolic (congestive) heart failure: Secondary | ICD-10-CM | POA: Diagnosis not present

## 2021-06-12 DIAGNOSIS — S2232XD Fracture of one rib, left side, subsequent encounter for fracture with routine healing: Secondary | ICD-10-CM | POA: Diagnosis not present

## 2021-06-12 DIAGNOSIS — R531 Weakness: Secondary | ICD-10-CM | POA: Diagnosis not present

## 2021-06-12 DIAGNOSIS — E876 Hypokalemia: Secondary | ICD-10-CM | POA: Diagnosis not present

## 2021-06-18 DIAGNOSIS — S2232XD Fracture of one rib, left side, subsequent encounter for fracture with routine healing: Secondary | ICD-10-CM | POA: Diagnosis not present

## 2021-06-18 DIAGNOSIS — R531 Weakness: Secondary | ICD-10-CM | POA: Diagnosis not present

## 2021-06-18 DIAGNOSIS — I5032 Chronic diastolic (congestive) heart failure: Secondary | ICD-10-CM | POA: Diagnosis not present

## 2021-06-18 NOTE — Progress Notes (Deleted)
Cardiology Office Note    Date:  06/18/2021   ID:  [name], [name] [birth year], MRN [id number]   PCP:  Asencion Noble, [place]  Cardiologist:  None *** Advanced Practice Provider:  No care team member to display Electrophysiologist:  None   [id number]}   No chief complaint on file.   History of Present Illness:  [name] is a 80 y.o. 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 Cardiology is consulted for elevated troponin and decreased EF.   Echo showed LVEF is 35% and she was short of breath after volume resuscitation treated with IV Lasix.  Wall motion abnormality consistent with Takotsubo but has multiple CRFs for CAD.  No ischemic work-up planned but may consider outpatient NST.  NSTEMI peak troponin 1033 in the setting of CKs greater than 3000      Past Medical History:  Diagnosis Date   Abdominal wall hernia, periumbilical, fat-containing 05/19/2021   CT AP 05/18/21: Fat containing periumbilical anterior abdominal wall hernia.  Second fat and bowel containing infraumbilical anterior abdominal wall hernia. No CT evidence of bowel incarceration.   Alcohol dependence ([place])    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[place])    Anxiety    Arthritis    At risk for seizures    Atherosclerosis of aorta ([place]) 05/19/2021   CT AP 05/18/21 finding   Carcinoid bronchial adenoma of right lung ([facility])    Carpal tunnel syndrome, bilateral    Cholestasis    Chronic diarrhea    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 Dx [facility]   Complicated UTI (urinary tract infection) 03/05/2020   Obstructive ureteral stone with E.coli bacteremia   Degenerative disc disease, cervical 05/19/2021   Cervical CT (CC: found down): Multilevel degenerative disc disease most pronounced C5-6.   Depression    GERD (gastroesophageal reflux disease)    Kidney stones    Liver disease    Macrocytic anemia 05/19/2021   Mitral valve  regurgitation, moderate 05/22/2021   Multiple closed fractures of ribs of left side 05/18/2021   Osteoporosis    Osteoporosis    Treated with denosumab   Physical deconditioning    Recurrent falls    Recurrent kidney stones    Calcium oxalate monohydrate on stone analysis 05/2020 at [facility] urology   Seizure [facility])    started on Keppra after [facility]   Subarachnoid bleed ([place])    Subarachnoid hemorrhage ([place]) 12/19/2019    Past Surgical History:  Procedure Laterality Date   ABDOMINAL HYSTERECTOMY     vaginal   ABDOMINAL HYSTERECTOMY     Vaginal   APPENDECTOMY     CARPAL TUNNEL RELEASE  01/21/2012   Procedure: CARPAL TUNNEL RELEASE;  Surgeon: Wynonia Sours, MD;  Location: [place];  Service: Orthopedics;  Laterality: Right;   CARPAL TUNNEL RELEASE Right 01/02/2012   Dr Fredna Dow ([place])   COLON RESECTION  2004   perf bowel after colonoscopy   CYSTOSCOPY W/ RETROGRADES Bilateral 02/28/2018   Procedure: CYSTOSCOPY WITH BILATERAL RETROGRADE PYELOGRAM;BILATERAL URETERAL STENT PLACEMENT;  Surgeon: Cleon Gustin, MD;  Location: [facility];  Service: Urology;  Laterality: Bilateral;   CYSTOSCOPY W/ URETERAL STENT PLACEMENT  2010   lt-lazer stone   CYSTOSCOPY W/ URETERAL STENT PLACEMENT Bilateral 02/29/2020   Procedure: CYSTOSCOPY WITH BILATERAL RETROGRADE PYELOGRAM;BILATERAL URETERAL STENT PLACEMENT;  Surgeon: Cleon Gustin, MD;  Location: [facility];  Service:  Urology;  Laterality: Bilateral;   CYSTOSCOPY WITH STENT PLACEMENT Bilateral 09/08/2014   Procedure: CYSTOSCOPY WITH STENT PLACEMENT;  Surgeon: Malka So, MD;  Location: [facility];  Service: Urology;  Laterality: Bilateral;   SMALL INTESTINE SURGERY  2004   Iliectomy   THORACOTOMY  2007   vatz-rt upper lobe   THORACOTOMY  12/01/2005   VATS Right Upper Lung Lobe - Dr Arlyce Dice ([facility])   TONSILLECTOMY     TONSILLECTOMY      Current Medications: No outpatient medications have been marked as taking for the 07/01/21  encounter (Appointment) with Imogene Burn, PA-C.     Allergies:   Clindamycin, Clindamycin/lincomycin, Doxycycline, and Doxycycline hyclate   Social History   Socioeconomic History   Marital status: Widowed    Spouse name: Not on file   Number of children: 2   Years of education: college   Highest education level: Not on file  Occupational History   Not on file  Tobacco Use   Smoking status: Not on file   Smokeless tobacco: Never  Vaping Use   Vaping Use: Never used  Substance and Sexual Activity   Alcohol use: Yes    Comment: 5 cartons of wine a week.   Drug use: No   Sexual activity: Not on file  Other Topics Concern   Not on file  Social History Narrative   ** Merged History Encounter **       12/10/20 Lives alone but has caregivers come to her home. Right-handed. One cup caffeine per day.   Social Determinants of Health   Financial Resource Strain: Not on file  Food Insecurity: Not on file  Transportation Needs: Not on file  Physical Activity: Not on file  Stress: Not on file  Social Connections: Not on file     Family History:  The patient's ***family history includes Lung cancer in her father; Other in her mother.   ROS:   Please see the history of present illness.    ROS All other systems reviewed and are negative.   PHYSICAL EXAM:   VS:  There were no vitals taken for this visit.  Physical Exam  GEN: Well nourished, well developed, in no acute distress  HEENT: normal  Neck: no JVD, carotid bruits, or masses Cardiac:RRR; no murmurs, rubs, or gallops  Respiratory:  clear to auscultation bilaterally, normal work of breathing GI: soft, nontender, nondistended, + BS Ext: without cyanosis, clubbing, or edema, Good distal pulses bilaterally MS: no deformity or atrophy  Skin: warm and dry, no rash Neuro:  Alert and Oriented x 3, Strength and sensation are intact Psych: euthymic mood, full affect  Wt Readings from Last 3 Encounters:  05/24/21 99 lb  10.4 oz (45.2 kg)  12/10/20 88 lb 8 oz (40.1 kg)  09/03/20 79 lb (35.8 kg)      Studies/Labs Reviewed:   EKG:  EKG is*** ordered today.  The ekg ordered today demonstrates ***  Recent Labs: 05/19/2021: TSH 7.048 05/21/2021: ALT 57 05/24/2021: BUN 16; Creatinine, Ser 0.75; Hemoglobin 10.9; Magnesium 1.7; Platelets 128; Potassium 3.8; Sodium 134   Lipid Panel    Component Value Date/Time   CHOL 181 05/20/2021 0029   TRIG 204 (H) 05/20/2021 0029   HDL 105 05/20/2021 0029   CHOLHDL 1.7 05/20/2021 0029   VLDL 41 (H) 05/20/2021 0029   LDLCALC 35 05/20/2021 0029    Additional studies/ records that were reviewed today include:    Echocardiogram 05/19/21:   1. The entire  mid ventricle to apex is akinetic. 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 . Left ventricular ejection fraction, by estimation, is 35%. The left ventricle has moderately decreased function. The left ventricle demonstrates regional wall motion abnormalities (see scoring diagram/findings for description). Left ventricular diastolic parameters are indeterminate.   2. Right ventricular systolic function is normal. The right ventricular  size is normal.   3. The mitral valve is normal in structure. Mild to moderate mitral valve regurgitation.   4. The aortic valve is tricuspid. Aortic valve regurgitation is mild. No  aortic stenosis is present.   5. The inferior vena cava is normal in size with greater than 50%  respiratory variability, suggesting right atrial pressure of 3 mmHg.   6. Cannot exclude small pfo with left to right shunt.     Risk Assessment/Calculations:   {Does this patient have ATRIAL FIBRILLATION?:[phone]}     ASSESSMENT:    No diagnosis found.   PLAN:  In order of problems listed above:  1. HFrEF: - EF 35% by echo - SOB after volume resuscitation rx w/ IV Lasix >> improved - I/O + 3.2 L but are very incomplete - add daily wts - hold off on ischemic eval  since DAPT risky in pt w/ falls - on Toprol XL 100 mg, losartan 25 mg - Lasix 20 mg IV x 1 >> 20 mg po qd - K+ supp ordered - feel BP will tolerate a little more, discuss spiro vs Entresto and/or Imdur w/ MD   2. Mild to mod MR/mild AR: - no obvious murmur, no sx - follow   3. CKD stage III: - Cr 1.36 on admit, may be 2nd dehydration >> improved   4. ETOH disorder: - found down after > 12 hr - dtr reports years of ETOH abuse - PT has seen, SNF recommended - Psych has seen, dx adjustment disorder w/ depressed mood, Lexapro increased 20 mg qd, f/u outpt    5. NSTEMI-- Peak troponin 1033, in the setting of CK > 3,000. 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 - With significant fall and ongoing fall risk, DAPT not advised -plan for limited echo 4-6 weeks, consider quality nuc study at [place] on D SPECT camera if ischemia needs to be excluded.    Shared Decision Making/Informed Consent   {Are you ordering a CV Procedure (e.g. stress test, cath, DCCV, TEE, etc)?   Press F2        :[id number]}    Medication Adjustments/Labs and Tests Ordered: Current medicines are reviewed at length with the patient today.  Concerns regarding medicines are outlined above.  Medication changes, Labs and Tests ordered today are listed in the Patient Instructions below. There are no Patient Instructions on file for this visit.   Sumner Boast, PA-C  06/18/2021 12:57 PM    [facility] [place], [place], [place]  [address] Phone: [phone]; Fax: [phone]

## 2021-07-01 ENCOUNTER — Ambulatory Visit: Payer: Medicare Other | Admitting: Physician Assistant

## 2021-07-02 DIAGNOSIS — I5032 Chronic diastolic (congestive) heart failure: Secondary | ICD-10-CM | POA: Diagnosis not present

## 2021-07-02 DIAGNOSIS — R531 Weakness: Secondary | ICD-10-CM | POA: Diagnosis not present

## 2021-07-02 DIAGNOSIS — I5020 Unspecified systolic (congestive) heart failure: Secondary | ICD-10-CM | POA: Diagnosis not present

## 2021-07-02 DIAGNOSIS — E46 Unspecified protein-calorie malnutrition: Secondary | ICD-10-CM | POA: Diagnosis not present

## 2021-07-12 DIAGNOSIS — S2232XD Fracture of one rib, left side, subsequent encounter for fracture with routine healing: Secondary | ICD-10-CM | POA: Diagnosis not present

## 2021-07-12 DIAGNOSIS — I5020 Unspecified systolic (congestive) heart failure: Secondary | ICD-10-CM | POA: Diagnosis not present

## 2021-07-12 DIAGNOSIS — R531 Weakness: Secondary | ICD-10-CM | POA: Diagnosis not present

## 2021-07-12 DIAGNOSIS — I5032 Chronic diastolic (congestive) heart failure: Secondary | ICD-10-CM | POA: Diagnosis not present

## 2021-07-15 DIAGNOSIS — N183 Chronic kidney disease, stage 3 unspecified: Secondary | ICD-10-CM | POA: Diagnosis not present

## 2021-07-15 DIAGNOSIS — I429 Cardiomyopathy, unspecified: Secondary | ICD-10-CM | POA: Diagnosis not present

## 2021-07-15 DIAGNOSIS — I130 Hypertensive heart and chronic kidney disease with heart failure and stage 1 through stage 4 chronic kidney disease, or unspecified chronic kidney disease: Secondary | ICD-10-CM | POA: Diagnosis not present

## 2021-07-15 DIAGNOSIS — I5020 Unspecified systolic (congestive) heart failure: Secondary | ICD-10-CM | POA: Diagnosis not present

## 2021-07-23 NOTE — Progress Notes (Signed)
Cardiology Office Note    Date:  07/29/2021   ID:  Angela, Hunter 10/03/1941, MRN [id number]   PCP:  Asencion Noble, MD   [place]  Cardiologist:  None   Advanced Practice Provider:  No care team member to display Electrophysiologist:  None   [id number]   Chief Complaint  Patient presents with   Follow-up     History of Present Illness:  [name] is a 80 y.o. female with long history of EtOH who was seen in the hospital 05/23/2021 after falling and being found 12 hours later by her daughters.  Troponins were elevated and echo showed reduced LVEF 35% and suggested Takotsubo stress cardiomyopathy.  Because of thrombocytopenia, EtOH and falls ischemic work-up was not done in the hospital.  Dr. Margaretann Loveless recommended high-quality nuclear study at [facility] on the D SPECT camera if ischemia needs to be excluded. Plan for limited echo in 4-6 weeks.  Patient comes in accompanied by her daughter. Denies chest pains, edema, shortness of breath, dizziness or presyncope. No regular exercise. Drinks wine  -daughter says a few cartons a day and each carton has 3 glasses in it.     Past Medical History:  Diagnosis Date   Abdominal wall hernia, periumbilical, fat-containing 05/19/2021   CT AP 05/18/21: Fat containing periumbilical anterior abdominal wall hernia.  Second fat and bowel containing infraumbilical anterior abdominal wall hernia. No CT evidence of bowel incarceration.   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   Depression    GERD (gastroesophageal reflux disease)    Kidney stones    Liver disease    Macrocytic anemia 05/19/2021   Mitral valve regurgitation, moderate 05/22/2021   Multiple closed fractures of ribs of left side 05/18/2021   Osteoporosis    Osteoporosis    Treated with denosumab   Physical deconditioning    Recurrent falls    Recurrent kidney stones    Calcium oxalate monohydrate on stone analysis 05/2020 at [facility] urology   Seizure [facility])    started on Keppra after [facility]   Subarachnoid bleed ([place])    Subarachnoid hemorrhage ([place]) 12/19/2019    Past Surgical History:  Procedure Laterality Date   ABDOMINAL HYSTERECTOMY     vaginal   ABDOMINAL HYSTERECTOMY     Vaginal   APPENDECTOMY     CARPAL TUNNEL RELEASE  01/21/2012   Procedure: CARPAL TUNNEL RELEASE;  Surgeon: Wynonia Sours, MD;  Location: [place];  Service: Orthopedics;  Laterality: Right;   CARPAL TUNNEL RELEASE Right 01/02/2012   Dr Fredna Dow ([place])   COLON RESECTION  2004   perf bowel after colonoscopy   CYSTOSCOPY W/ RETROGRADES Bilateral 02/28/2018   Procedure: CYSTOSCOPY WITH BILATERAL RETROGRADE PYELOGRAM;BILATERAL URETERAL STENT PLACEMENT;  Surgeon: Cleon Gustin, MD;  Location: [facility];  Service: Urology;  Laterality: Bilateral;  CYSTOSCOPY W/ URETERAL STENT PLACEMENT  2010   lt-lazer stone   CYSTOSCOPY W/ URETERAL STENT PLACEMENT Bilateral 02/29/2020   Procedure: CYSTOSCOPY WITH BILATERAL RETROGRADE PYELOGRAM;BILATERAL URETERAL STENT PLACEMENT;  Surgeon: Cleon Gustin, MD;  Location: [facility];  Service: Urology;  Laterality: Bilateral;   CYSTOSCOPY WITH STENT PLACEMENT Bilateral 09/08/2014   Procedure: CYSTOSCOPY WITH STENT PLACEMENT;  Surgeon: Malka So, MD;  Location: [facility];  Service: Urology;  Laterality: Bilateral;   SMALL INTESTINE SURGERY  2004    Iliectomy   THORACOTOMY  2007   vatz-rt upper lobe   THORACOTOMY  12/01/2005   VATS Right Upper Lung Lobe - Dr Arlyce Dice ([facility])   TONSILLECTOMY     TONSILLECTOMY      Current Medications: Current Meds  Medication Sig   Biotin 2.5 MG TABS Take 1 tablet by mouth daily.    Calcium Carb-Cholecalciferol (CALCIUM + D3 PO) Take 1 tablet by mouth daily with breakfast.   cycloSPORINE (RESTASIS) 0.05 % ophthalmic emulsion Place 1 drop into both eyes 2 (two) times daily.    denosumab (PROLIA) 60 MG/ML SOSY injection Inject 60 mg into the skin every 6 (six) months.   escitalopram (LEXAPRO) 20 MG tablet Take 1 tablet (20 mg total) by mouth in the morning.   folic acid (FOLVITE) 1 MG tablet Take 1 tablet (1 mg total) by mouth daily.   losartan (COZAAR) 25 MG tablet Take 1 tablet (25 mg total) by mouth daily.   metoprolol succinate (TOPROL-XL) 100 MG 24 hr tablet Take 1 tablet (100 mg total) by mouth in the morning. Take with or immediately following a meal.   Multiple Vitamin (MULTIVITAMIN) capsule Take 1 capsule by mouth daily.   potassium citrate (UROCIT-K) 10 MEQ (1080 MG) SR tablet Take 10 mEq by mouth daily.    spironolactone (ALDACTONE) 25 MG tablet Take 1 tablet (25 mg total) by mouth daily.   thiamine 100 MG tablet Take 100 mg by mouth daily.      Allergies:   Clindamycin, Clindamycin/lincomycin, Doxycycline, and Doxycycline hyclate   Social History   Socioeconomic History   Marital status: Widowed    Spouse name: Not on file   Number of children: 2   Years of education: college   Highest education level: Not on file  Occupational History   Not on file  Tobacco Use   Smoking status: Every Day    Types: Cigarettes    Last attempt to quit: 12/01/1996    Years since quitting: 24.6   Smokeless tobacco: Never  Vaping Use   Vaping Use: Never used  Substance and Sexual Activity   Alcohol use: Yes    Comment: 5 cartons of wine a week.   Drug use: No   Sexual activity: Not on file   Other Topics Concern   Not on file  Social History Narrative   ** Merged History Encounter **       12/10/20 Lives alone but has caregivers come to her home. Right-handed. One cup caffeine per day.   Social Determinants of Health   Financial Resource Strain: Not on file  Food Insecurity: Not on file  Transportation Needs: Not on file  Physical Activity: Not on file  Stress: Not on file  Social Connections: Not on file     Family History:  The patient's  family history includes Lung cancer in her father; Other in her mother.   ROS:   Please see the history of present illness.    ROS  All other systems reviewed and are negative.   PHYSICAL EXAM:   VS:  BP 112/64   Pulse 64   Ht 5' (1.524 m)   Wt 84 lb 6.4 oz (38.3 kg)   SpO2 98%   BMI 16.48 kg/m   Physical Exam  GEN: Thin, elderly, in no acute distress  Neck: no JVD, carotid bruits, or masses Cardiac:RRR; no murmurs, rubs, or gallops  Respiratory:  clear to auscultation bilaterally, normal work of breathing GI: soft, nontender, nondistended, + BS Ext: without cyanosis, clubbing, or edema, Good distal pulses bilaterally Neuro:  Alert and Oriented x 3 Psych: euthymic mood, full affect  Wt Readings from Last 3 Encounters:  07/29/21 84 lb 6.4 oz (38.3 kg)  05/24/21 99 lb 10.4 oz (45.2 kg)  12/10/20 88 lb 8 oz (40.1 kg)      Studies/Labs Reviewed:   EKG:  EKG is not ordered today.     Recent Labs: 05/19/2021: TSH 7.048 05/21/2021: ALT 57 05/24/2021: BUN 16; Creatinine, Ser 0.75; Hemoglobin 10.9; Magnesium 1.7; Platelets 128; Potassium 3.8; Sodium 134   Lipid Panel    Component Value Date/Time   CHOL 181 05/20/2021 0029   TRIG 204 (H) 05/20/2021 0029   HDL 105 05/20/2021 0029   CHOLHDL 1.7 05/20/2021 0029   VLDL 41 (H) 05/20/2021 0029   LDLCALC 35 05/20/2021 0029    Additional studies/ records that were reviewed today include:  Echocardiogram 05/19/21:   1. The entire mid ventricle to apex is akinetic.  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 . Left ventricular ejection fraction, by estimation, is 35%. The left ventricle has moderately decreased function. The left ventricle demonstrates regional wall motion abnormalities (see scoring diagram/findings for description). Left ventricular diastolic parameters are indeterminate.   2. Right ventricular systolic function is normal. The right ventricular  size is normal.   3. The mitral valve is normal in structure. Mild to moderate mitral valve regurgitation.   4. The aortic valve is tricuspid. Aortic valve regurgitation is mild. No  aortic stenosis is present.   5. The inferior vena cava is normal in size with greater than 50%  respiratory variability, suggesting right atrial pressure of 3 mmHg.   6. Cannot exclude small pfo with left to right shunt.      Risk Assessment/Calculations:         ASSESSMENT:    1. Heart failure with reduced ejection fraction (HCC)   2. Elevated troponin   3. Mitral valve insufficiency, unspecified etiology   4. ETOH abuse   5. Stage 3 chronic kidney disease, unspecified whether stage 3a or 3b CKD (HCC)      PLAN:  In order of problems listed above:  Heart failure reduced EF EF 35% by echo on Toprol.  If blood pressure tolerates can consider Entresto  in the future.  No ischemic work-up done in the hospital because of thrombocytopenia and falls.  If she does need an ischemic work-up Dr. Margaretann Loveless recommended high-quality nuclear study at [facility] on the D SPECT camera. No chest pain or cardiac complaints. Will check labs and echo and then decide if need to change meds  Elevated troponins/NSTEMI in the setting of fall and found 12 hours later-no chest pain  Mild to moderate MR mild AI  EtOH abuse-continues to drink alcohol -advised reduced alcohol intake.  CKD stage III-check labs as they haven't been done since the hospital.    Shared Decision Making/Informed  Consent  Medication Adjustments/Labs and Tests Ordered: Current medicines are reviewed at length with the patient today.  Concerns regarding medicines are outlined above.  Medication changes, Labs and Tests ordered today are listed in the Patient Instructions below. Patient Instructions  Medication Instructions:  Your physician recommends that you continue on your current medications as directed. Please refer to the Current Medication list given to you today.  *If you need a refill on your cardiac medications before your next appointment, please call your pharmacy*   Lab Work: BMET CBC If you have labs (blood work) drawn today and your tests are completely normal, you will receive your results only by: [place] (if you have MyChart) OR A paper copy in the mail If you have any lab test that is abnormal or we need to change your treatment, we will call you to review the results.   Testing/Procedures: Your physician has requested that you have an echocardiogram. Echocardiography is a painless test that uses sound waves to create images of your heart. It provides your doctor with information about the size and shape of your heart and how well your heart's chambers and valves are working. This procedure takes approximately one hour. There are no restrictions for this procedure.    Follow-Up: At [facility], you and your health needs are our priority.  As part of our continuing mission to provide you with exceptional heart care, we have created designated Provider Care Teams.  These Care Teams include your primary Cardiologist (physician) and Advanced Practice Providers (APPs -  Physician Assistants and Nurse Practitioners) who all work together to provide you with the care you need, when you need it.  We recommend signing up for the patient portal called "MyChart".  Sign up information is provided on this After Visit Summary.  MyChart is used to connect with patients for  Virtual Visits (Telemedicine).  Patients are able to view lab/test results, encounter notes, upcoming appointments, etc.  Non-urgent messages can be sent to your provider as well.   To learn more about what you can do with MyChart, go to [email or web address].    Your next appointment:   Follow up with Gerrianne Scale after Echo Follow up with to establish with MD   Other Instructions     Signed, Ermalinda Barrios, PA-C  07/29/2021 2:01 PM    [address], [place], [place]  [address] Phone: [phone]; Fax: [phone]

## 2021-07-29 ENCOUNTER — Ambulatory Visit (INDEPENDENT_AMBULATORY_CARE_PROVIDER_SITE_OTHER): Payer: Medicare Other | Admitting: Physician Assistant

## 2021-07-29 ENCOUNTER — Encounter: Payer: Self-pay | Admitting: Physician Assistant

## 2021-07-29 ENCOUNTER — Other Ambulatory Visit: Payer: Self-pay

## 2021-07-29 VITALS — BP 112/64 | HR 64 | Ht 60.0 in | Wt 84.4 lb

## 2021-07-29 DIAGNOSIS — N183 Chronic kidney disease, stage 3 unspecified: Secondary | ICD-10-CM

## 2021-07-29 DIAGNOSIS — I340 Nonrheumatic mitral (valve) insufficiency: Secondary | ICD-10-CM | POA: Diagnosis not present

## 2021-07-29 DIAGNOSIS — I5020 Unspecified systolic (congestive) heart failure: Secondary | ICD-10-CM

## 2021-07-29 DIAGNOSIS — R778 Other specified abnormalities of plasma proteins: Secondary | ICD-10-CM | POA: Diagnosis not present

## 2021-07-29 DIAGNOSIS — F1010 Alcohol abuse, uncomplicated: Secondary | ICD-10-CM | POA: Diagnosis not present

## 2021-07-29 NOTE — Patient Instructions (Signed)
Medication Instructions:  Your physician recommends that you continue on your current medications as directed. Please refer to the Current Medication list given to you today.  *If you need a refill on your cardiac medications before your next appointment, please call your pharmacy*   Lab Work: BMET CBC If you have labs (blood work) drawn today and your tests are completely normal, you will receive your results only by: [place] (if you have MyChart) OR A paper copy in the mail If you have any lab test that is abnormal or we need to change your treatment, we will call you to review the results.   Testing/Procedures: Your physician has requested that you have an echocardiogram. Echocardiography is a painless test that uses sound waves to create images of your heart. It provides your doctor with information about the size and shape of your heart and how well your heart's chambers and valves are working. This procedure takes approximately one hour. There are no restrictions for this procedure.    Follow-Up: At [facility], you and your health needs are our priority.  As part of our continuing mission to provide you with exceptional heart care, we have created designated Provider Care Teams.  These Care Teams include your primary Cardiologist (physician) and Advanced Practice Providers (APPs -  Physician Assistants and Nurse Practitioners) who all work together to provide you with the care you need, when you need it.  We recommend signing up for the patient portal called "MyChart".  Sign up information is provided on this After Visit Summary.  MyChart is used to connect with patients for Virtual Visits (Telemedicine).  Patients are able to view lab/test results, encounter notes, upcoming appointments, etc.  Non-urgent messages can be sent to your provider as well.   To learn more about what you can do with MyChart, go to [email or web address].    Your next appointment:   Follow up  with Gerrianne Scale after Echo Follow up with to establish with MD   Other Instructions

## 2021-08-19 ENCOUNTER — Other Ambulatory Visit: Payer: Self-pay

## 2021-08-19 ENCOUNTER — Ambulatory Visit (HOSPITAL_COMMUNITY)
Admission: RE | Admit: 2021-08-19 | Discharge: 2021-08-19 | Disposition: A | Discharge status: Home / Self Care | Payer: Medicare Other | Source: Ambulatory Visit | Attending: Physician Assistant | Admitting: Physician Assistant

## 2021-08-19 DIAGNOSIS — I5023 Acute on chronic systolic (congestive) heart failure: Secondary | ICD-10-CM | POA: Diagnosis not present

## 2021-08-19 DIAGNOSIS — I5020 Unspecified systolic (congestive) heart failure: Secondary | ICD-10-CM | POA: Diagnosis not present

## 2021-08-19 LAB — ECHOCARDIOGRAM COMPLETE
AR max vel: 1.94 cm2
AV Area VTI: 1.8 cm2
AV Area mean vel: 1.87 cm2
AV Mean grad: 2.4 mmHg
AV Peak grad: 4.5 mmHg
Ao pk vel: 1.06 m/s
Area-P 1/2: 2.83 cm2
S' Lateral: 2.6 cm

## 2021-08-19 NOTE — Progress Notes (Signed)
*  PRELIMINARY RESULTS* Echocardiogram 2D Echocardiogram has been performed.  [name] 08/19/2021, 2:42 PM

## 2021-08-29 ENCOUNTER — Encounter (HOSPITAL_COMMUNITY)
Admission: RE | Admit: 2021-08-29 | Discharge: 2021-08-29 | Disposition: A | Discharge status: Home / Self Care | Payer: Medicare Other | Source: Ambulatory Visit | Attending: Internal Medicine | Admitting: Internal Medicine

## 2021-09-01 ENCOUNTER — Ambulatory Visit: Admit: 2021-09-01 | Payer: Self-pay | Source: Home / Self Care

## 2021-09-02 DIAGNOSIS — S56922A Laceration of unspecified muscles, fascia and tendons at forearm level, left arm, initial encounter: Secondary | ICD-10-CM | POA: Diagnosis not present

## 2021-09-02 DIAGNOSIS — Z23 Encounter for immunization: Secondary | ICD-10-CM | POA: Diagnosis not present

## 2021-09-04 ENCOUNTER — Ambulatory Visit: Payer: Medicare Other | Admitting: Cardiology

## 2021-09-24 ENCOUNTER — Telehealth: Payer: Self-pay

## 2021-09-24 ENCOUNTER — Telehealth: Payer: Self-pay | Admitting: Physician Assistant

## 2021-09-24 DIAGNOSIS — R609 Edema, unspecified: Secondary | ICD-10-CM | POA: Diagnosis not present

## 2021-09-24 DIAGNOSIS — I5020 Unspecified systolic (congestive) heart failure: Secondary | ICD-10-CM | POA: Diagnosis not present

## 2021-09-24 DIAGNOSIS — I509 Heart failure, unspecified: Secondary | ICD-10-CM | POA: Diagnosis not present

## 2021-09-24 DIAGNOSIS — I5033 Acute on chronic diastolic (congestive) heart failure: Secondary | ICD-10-CM | POA: Diagnosis not present

## 2021-09-24 DIAGNOSIS — E440 Moderate protein-calorie malnutrition: Secondary | ICD-10-CM | POA: Diagnosis not present

## 2021-09-24 DIAGNOSIS — Z79899 Other long term (current) drug therapy: Secondary | ICD-10-CM | POA: Diagnosis not present

## 2021-09-24 MED ORDER — SPIRONOLACTONE 25 MG PO TABS
25.0000 mg | ORAL_TABLET | Freq: Every day | ORAL | 3 refills | Status: DC
Start: 2021-09-24 — End: 2022-01-25

## 2021-09-24 NOTE — Telephone Encounter (Signed)
Medication Refill

## 2021-09-24 NOTE — Telephone Encounter (Signed)
Pt c/o swelling: STAT is pt has developed SOB within 24 hours  If swelling, where is the swelling located? Feet and ankles  How much weight have you gained and in what time span? Not sure. The swelling just started yesterday  Have you gained 3 pounds in a day or 5 pounds in a week?   Do you have a log of your daily weights (if so, list)? no  Are you currently taking a fluid pill? No. Patient's caregivers say they do not have any for the patient at the house   Are you currently SOB? Daughter is not sure. She is not with the patient   Have you traveled recently? no  Daughter of the patient called. She does not live with the patient and has to get all the information from her caregivers.The patient does have some dementia.  The daughter is not sure what to do. The patient has an appt with her PCP today but needs to know what the Cardiologist says, as they will ask about that.

## 2021-09-24 NOTE — Telephone Encounter (Signed)
Pts daughter stated that pt is holding fluid in her feet/ankles since yesterday with them increasing in size to that of a grapefruit. Pt had ran out of Spironolactone- will send refill in. Pt denies sob/"chest heaviness". No daily weight log. Advised pt's daughter that she should keep a log. Pt's daughter will inform [facility]N as daughter lives in [place], [place].

## 2021-09-27 NOTE — Telephone Encounter (Signed)
Spoke to daughter who stated that since restarting the Spironolactone this week along with keeping feet elevated as often as possible, pt is doing much better as the swelling has decreased. Pt's daughter verbalized understanding to give office a call back if anything else was needed.   Will forward to provider as FYI.

## 2021-10-07 DIAGNOSIS — M25511 Pain in right shoulder: Secondary | ICD-10-CM | POA: Diagnosis not present

## 2021-10-07 DIAGNOSIS — I5022 Chronic systolic (congestive) heart failure: Secondary | ICD-10-CM | POA: Diagnosis not present

## 2021-10-07 DIAGNOSIS — D649 Anemia, unspecified: Secondary | ICD-10-CM | POA: Diagnosis not present

## 2021-10-09 ENCOUNTER — Telehealth: Payer: Self-pay | Admitting: Physician Assistant

## 2021-10-09 MED ORDER — METOPROLOL SUCCINATE ER 100 MG PO TB24: 100.0000 mg | ORAL_TABLET | Freq: Every morning | ORAL | 1 refills | Status: DC

## 2021-10-09 MED ORDER — LOSARTAN POTASSIUM 25 MG PO TABS
25.0000 mg | ORAL_TABLET | Freq: Every day | ORAL | 1 refills | Status: DC
Start: 2021-10-09 — End: 2021-11-07

## 2021-10-09 NOTE — Telephone Encounter (Signed)
Complete

## 2021-10-09 NOTE — Telephone Encounter (Signed)
New Message:     Daughter called and wanted to know if pt needs to continue taking Losartan and Metoprolol? If so, patient will need refills.   Pt c/o medication issue:  1. Name of Medication: Metoprolol and Losartan  2. How are you currently taking this medication (dosage and times per day)?  1 time daily  for each  3. Are you having a reaction (difficulty breathing--STAT)?   4. What is your medication issue? Should patient continue to take them

## 2021-11-02 ENCOUNTER — Inpatient Hospital Stay (HOSPITAL_COMMUNITY)
Admission: EM | Admit: 2021-11-02 | Discharge: 2021-11-07 | DRG: 682 | Disposition: A | Discharge status: Skilled Nursing Facility | Payer: Medicare Other | Attending: Family Medicine | Admitting: Family Medicine

## 2021-11-02 ENCOUNTER — Emergency Department (HOSPITAL_COMMUNITY): Payer: Medicare Other

## 2021-11-02 ENCOUNTER — Other Ambulatory Visit: Payer: Self-pay

## 2021-11-02 ENCOUNTER — Encounter (HOSPITAL_COMMUNITY): Payer: Self-pay

## 2021-11-02 DIAGNOSIS — M2578 Osteophyte, vertebrae: Secondary | ICD-10-CM | POA: Diagnosis not present

## 2021-11-02 DIAGNOSIS — D6959 Other secondary thrombocytopenia: Secondary | ICD-10-CM | POA: Diagnosis present

## 2021-11-02 DIAGNOSIS — I110 Hypertensive heart disease with heart failure: Secondary | ICD-10-CM | POA: Diagnosis present

## 2021-11-02 DIAGNOSIS — G40909 Epilepsy, unspecified, not intractable, without status epilepticus: Secondary | ICD-10-CM | POA: Diagnosis present

## 2021-11-02 DIAGNOSIS — I5022 Chronic systolic (congestive) heart failure: Secondary | ICD-10-CM | POA: Diagnosis present

## 2021-11-02 DIAGNOSIS — E872 Acidosis, unspecified: Secondary | ICD-10-CM | POA: Diagnosis present

## 2021-11-02 DIAGNOSIS — E538 Deficiency of other specified B group vitamins: Secondary | ICD-10-CM | POA: Diagnosis present

## 2021-11-02 DIAGNOSIS — Z79899 Other long term (current) drug therapy: Secondary | ICD-10-CM | POA: Diagnosis not present

## 2021-11-02 DIAGNOSIS — R9082 White matter disease, unspecified: Secondary | ICD-10-CM | POA: Diagnosis not present

## 2021-11-02 DIAGNOSIS — S0101XA Laceration without foreign body of scalp, initial encounter: Secondary | ICD-10-CM | POA: Diagnosis not present

## 2021-11-02 DIAGNOSIS — F1027 Alcohol dependence with alcohol-induced persisting dementia: Secondary | ICD-10-CM | POA: Diagnosis present

## 2021-11-02 DIAGNOSIS — R0902 Hypoxemia: Secondary | ICD-10-CM

## 2021-11-02 DIAGNOSIS — S3991XA Unspecified injury of abdomen, initial encounter: Secondary | ICD-10-CM | POA: Diagnosis not present

## 2021-11-02 DIAGNOSIS — S0101XD Laceration without foreign body of scalp, subsequent encounter: Secondary | ICD-10-CM | POA: Diagnosis not present

## 2021-11-02 DIAGNOSIS — Z85118 Personal history of other malignant neoplasm of bronchus and lung: Secondary | ICD-10-CM | POA: Diagnosis not present

## 2021-11-02 DIAGNOSIS — D696 Thrombocytopenia, unspecified: Secondary | ICD-10-CM | POA: Diagnosis present

## 2021-11-02 DIAGNOSIS — E860 Dehydration: Secondary | ICD-10-CM

## 2021-11-02 DIAGNOSIS — Y92009 Unspecified place in unspecified non-institutional (private) residence as the place of occurrence of the external cause: Secondary | ICD-10-CM

## 2021-11-02 DIAGNOSIS — R0602 Shortness of breath: Secondary | ICD-10-CM | POA: Diagnosis not present

## 2021-11-02 DIAGNOSIS — S299XXA Unspecified injury of thorax, initial encounter: Secondary | ICD-10-CM | POA: Diagnosis not present

## 2021-11-02 DIAGNOSIS — Z90710 Acquired absence of both cervix and uterus: Secondary | ICD-10-CM | POA: Diagnosis not present

## 2021-11-02 DIAGNOSIS — R58 Hemorrhage, not elsewhere classified: Secondary | ICD-10-CM | POA: Diagnosis not present

## 2021-11-02 DIAGNOSIS — Z87891 Personal history of nicotine dependence: Secondary | ICD-10-CM

## 2021-11-02 DIAGNOSIS — E876 Hypokalemia: Secondary | ICD-10-CM | POA: Diagnosis not present

## 2021-11-02 DIAGNOSIS — R29890 Loss of height: Secondary | ICD-10-CM | POA: Diagnosis not present

## 2021-11-02 DIAGNOSIS — J9811 Atelectasis: Secondary | ICD-10-CM | POA: Diagnosis not present

## 2021-11-02 DIAGNOSIS — R279 Unspecified lack of coordination: Secondary | ICD-10-CM | POA: Diagnosis not present

## 2021-11-02 DIAGNOSIS — W19XXXA Unspecified fall, initial encounter: Secondary | ICD-10-CM | POA: Diagnosis not present

## 2021-11-02 DIAGNOSIS — Z801 Family history of malignant neoplasm of trachea, bronchus and lung: Secondary | ICD-10-CM

## 2021-11-02 DIAGNOSIS — R079 Chest pain, unspecified: Secondary | ICD-10-CM | POA: Diagnosis not present

## 2021-11-02 DIAGNOSIS — R636 Underweight: Secondary | ICD-10-CM | POA: Diagnosis present

## 2021-11-02 DIAGNOSIS — Z20822 Contact with and (suspected) exposure to covid-19: Secondary | ICD-10-CM | POA: Diagnosis present

## 2021-11-02 DIAGNOSIS — R531 Weakness: Secondary | ICD-10-CM | POA: Diagnosis not present

## 2021-11-02 DIAGNOSIS — E038 Other specified hypothyroidism: Secondary | ICD-10-CM | POA: Diagnosis present

## 2021-11-02 DIAGNOSIS — K219 Gastro-esophageal reflux disease without esophagitis: Secondary | ICD-10-CM | POA: Diagnosis present

## 2021-11-02 DIAGNOSIS — N179 Acute kidney failure, unspecified: Principal | ICD-10-CM | POA: Diagnosis present

## 2021-11-02 DIAGNOSIS — Z681 Body mass index (BMI) 19 or less, adult: Secondary | ICD-10-CM

## 2021-11-02 DIAGNOSIS — E512 Wernicke's encephalopathy: Secondary | ICD-10-CM | POA: Diagnosis not present

## 2021-11-02 DIAGNOSIS — J9601 Acute respiratory failure with hypoxia: Secondary | ICD-10-CM | POA: Insufficient documentation

## 2021-11-02 DIAGNOSIS — R7989 Other specified abnormal findings of blood chemistry: Secondary | ICD-10-CM | POA: Diagnosis present

## 2021-11-02 DIAGNOSIS — Z743 Need for continuous supervision: Secondary | ICD-10-CM | POA: Diagnosis not present

## 2021-11-02 DIAGNOSIS — M810 Age-related osteoporosis without current pathological fracture: Secondary | ICD-10-CM | POA: Diagnosis present

## 2021-11-02 DIAGNOSIS — E875 Hyperkalemia: Secondary | ICD-10-CM | POA: Diagnosis present

## 2021-11-02 DIAGNOSIS — W1830XA Fall on same level, unspecified, initial encounter: Secondary | ICD-10-CM | POA: Diagnosis present

## 2021-11-02 DIAGNOSIS — R296 Repeated falls: Secondary | ICD-10-CM | POA: Diagnosis not present

## 2021-11-02 DIAGNOSIS — K709 Alcoholic liver disease, unspecified: Secondary | ICD-10-CM | POA: Diagnosis present

## 2021-11-02 DIAGNOSIS — Z7401 Bed confinement status: Secondary | ICD-10-CM | POA: Diagnosis not present

## 2021-11-02 DIAGNOSIS — R000 Tachycardia, unspecified: Secondary | ICD-10-CM

## 2021-11-02 DIAGNOSIS — J90 Pleural effusion, not elsewhere classified: Secondary | ICD-10-CM | POA: Diagnosis not present

## 2021-11-02 DIAGNOSIS — R9431 Abnormal electrocardiogram [ECG] [EKG]: Secondary | ICD-10-CM | POA: Diagnosis not present

## 2021-11-02 LAB — BASIC METABOLIC PANEL
Anion gap: 12 (ref 5–15)
Anion gap: 15 (ref 5–15)
BUN: 46 mg/dL — ABNORMAL HIGH (ref 8–23)
BUN: 40 mg/dL — ABNORMAL HIGH (ref 8–23)
CO2: 17 mmol/L — ABNORMAL LOW (ref 22–32)
CO2: 13 mmol/L — ABNORMAL LOW (ref 22–32)
Calcium: 8.9 mg/dL (ref 8.9–10.3)
Calcium: 7.9 mg/dL — ABNORMAL LOW (ref 8.9–10.3)
Chloride: 102 mmol/L (ref 98–111)
Chloride: 103 mmol/L (ref 98–111)
Creatinine, Ser: 1.97 mg/dL — ABNORMAL HIGH (ref 0.44–1.00)
Creatinine, Ser: 1.85 mg/dL — ABNORMAL HIGH (ref 0.44–1.00)
GFR, Estimated: 25 mL/min — ABNORMAL LOW (ref >60)
GFR, Estimated: 27 mL/min — ABNORMAL LOW (ref >60)
Glucose, Bld: 89 mg/dL (ref 70–99)
Glucose, Bld: 131 mg/dL — ABNORMAL HIGH (ref 70–99)
Potassium: 6.4 mmol/L (ref 3.5–5.1)
Potassium: 4.3 mmol/L (ref 3.5–5.1)
Sodium: 131 mmol/L — ABNORMAL LOW (ref 135–145)
Sodium: 131 mmol/L — ABNORMAL LOW (ref 135–145)

## 2021-11-02 LAB — RESP PANEL BY RT-PCR (FLU A&B, COVID) ARPGX2
Influenza A by PCR: NEGATIVE
Influenza B by PCR: NEGATIVE
SARS Coronavirus 2 by RT PCR: NEGATIVE

## 2021-11-02 LAB — CBC
HCT: 31.8 % — ABNORMAL LOW (ref 36.0–46.0)
Hemoglobin: 10.7 g/dL — ABNORMAL LOW (ref 12.0–15.0)
MCH: 33.4 pg (ref 26.0–34.0)
MCHC: 33.6 g/dL (ref 30.0–36.0)
MCV: 99.4 fL (ref 80.0–100.0)
Platelets: 164 10*3/uL (ref 150–400)
RBC: 3.2 MIL/uL — ABNORMAL LOW (ref 3.87–5.11)
RDW: 12.5 % (ref 11.5–15.5)
WBC: 12 10*3/uL — ABNORMAL HIGH (ref 4.0–10.5)
nRBC: 0 % (ref 0.0–0.2)

## 2021-11-02 LAB — ETHANOL: Alcohol, Ethyl (B): <10 mg/dL (ref <10)

## 2021-11-02 MED ORDER — ONDANSETRON HCL 4 MG PO TABS: 4.0000 mg | ORAL_TABLET | Freq: Four times a day (QID) | ORAL | Status: DC | PRN

## 2021-11-02 MED ORDER — SODIUM CHLORIDE 0.9 % IV BOLUS
1000.0000 mL | Freq: Once | INTRAVENOUS | Status: AC
  Administered 2021-11-02: 1000 mL via INTRAVENOUS

## 2021-11-02 MED ORDER — LORAZEPAM 1 MG PO TABS
0.0000 mg | ORAL_TABLET | Freq: Two times a day (BID) | ORAL | Status: AC
  Administered 2021-11-04 – 2021-11-05 (×2): 1 mg via ORAL
  Filled 2021-11-02 (×2): qty 1

## 2021-11-02 MED ORDER — ENOXAPARIN SODIUM 40 MG/0.4ML IJ SOSY
40.0000 mg | PREFILLED_SYRINGE | INTRAMUSCULAR | Status: DC
  Administered 2021-11-02: 40 mg via SUBCUTANEOUS
  Filled 2021-11-02: qty 0.4

## 2021-11-02 MED ORDER — FOLIC ACID 1 MG PO TABS
1.0000 mg | ORAL_TABLET | Freq: Every day | ORAL | Status: DC
  Administered 2021-11-02 – 2021-11-07 (×6): 1 mg via ORAL
  Filled 2021-11-02 (×6): qty 1

## 2021-11-02 MED ORDER — ADULT MULTIVITAMIN W/MINERALS CH
1.0000 | ORAL_TABLET | Freq: Every day | ORAL | Status: DC
  Administered 2021-11-02 – 2021-11-07 (×6): 1 via ORAL
  Filled 2021-11-02 (×6): qty 1

## 2021-11-02 MED ORDER — CALCIUM GLUCONATE-NACL 2-0.675 GM/100ML-% IV SOLN
2.0000 g | Freq: Once | INTRAVENOUS | Status: DC
  Filled 2021-11-02: qty 100

## 2021-11-02 MED ORDER — SODIUM ZIRCONIUM CYCLOSILICATE 5 G PO PACK
10.0000 g | PACK | Freq: Once | ORAL | Status: AC
  Administered 2021-11-02: 10 g via ORAL
  Filled 2021-11-02: qty 2

## 2021-11-02 MED ORDER — LORAZEPAM 1 MG PO TABS
0.0000 mg | ORAL_TABLET | Freq: Four times a day (QID) | ORAL | Status: AC
Start: 2021-11-02 — End: 2021-11-04
  Administered 2021-11-02 – 2021-11-03 (×2): 1 mg via ORAL
  Administered 2021-11-03: 2 mg via ORAL
  Administered 2021-11-04 (×2): 1 mg via ORAL
  Filled 2021-11-02 (×4): qty 1
  Filled 2021-11-02: qty 2

## 2021-11-02 MED ORDER — THIAMINE HCL 100 MG/ML IJ SOLN: 100.0000 mg | Freq: Every day | INTRAMUSCULAR | Status: DC

## 2021-11-02 MED ORDER — ALBUTEROL SULFATE (2.5 MG/3ML) 0.083% IN NEBU
10.0000 mg | INHALATION_SOLUTION | Freq: Once | RESPIRATORY_TRACT | Status: AC
  Administered 2021-11-02: 10 mg via RESPIRATORY_TRACT
  Filled 2021-11-02: qty 12

## 2021-11-02 MED ORDER — LIDOCAINE-EPINEPHRINE (PF) 2 %-1:200000 IJ SOLN
10.0000 mL | Freq: Once | INTRAMUSCULAR | Status: AC
  Administered 2021-11-02: 10 mL
  Filled 2021-11-02: qty 20

## 2021-11-02 MED ORDER — LORAZEPAM 1 MG PO TABS: 1.0000 mg | ORAL_TABLET | ORAL | Status: AC | PRN

## 2021-11-02 MED ORDER — SODIUM CHLORIDE 0.9 % IV SOLN: INTRAVENOUS | Status: DC

## 2021-11-02 MED ORDER — ONDANSETRON HCL 4 MG/2ML IJ SOLN: 4.0000 mg | Freq: Four times a day (QID) | INTRAMUSCULAR | Status: DC | PRN

## 2021-11-02 MED ORDER — LORAZEPAM 2 MG/ML IJ SOLN
1.0000 mg | INTRAMUSCULAR | Status: AC | PRN
  Administered 2021-11-03 (×2): 2 mg via INTRAVENOUS
  Administered 2021-11-03: 3 mg via INTRAVENOUS
  Administered 2021-11-03: 1 mg via INTRAVENOUS
  Administered 2021-11-04: 2 mg via INTRAVENOUS
  Administered 2021-11-05: 3 mg via INTRAVENOUS
  Filled 2021-11-02: qty 1
  Filled 2021-11-02: qty 2
  Filled 2021-11-02 (×2): qty 1
  Filled 2021-11-02: qty 2
  Filled 2021-11-02: qty 1

## 2021-11-02 MED ORDER — LACTATED RINGERS IV BOLUS
1000.0000 mL | Freq: Once | INTRAVENOUS | Status: AC
  Administered 2021-11-02: 1000 mL via INTRAVENOUS

## 2021-11-02 MED ORDER — ALBUTEROL SULFATE (2.5 MG/3ML) 0.083% IN NEBU
INHALATION_SOLUTION | RESPIRATORY_TRACT | Status: AC
  Filled 2021-11-02: qty 3

## 2021-11-02 MED ORDER — METOPROLOL SUCCINATE ER 50 MG PO TB24: 100.0000 mg | ORAL_TABLET | Freq: Every morning | ORAL | Status: DC

## 2021-11-02 MED ORDER — THIAMINE HCL 100 MG PO TABS
100.0000 mg | ORAL_TABLET | Freq: Every day | ORAL | Status: DC
  Administered 2021-11-02: 100 mg via ORAL
  Filled 2021-11-02 (×2): qty 1

## 2021-11-02 NOTE — ED Notes (Signed)
Head wound cleansed lac noted to top of head that is scabbed over approx 1 inch

## 2021-11-02 NOTE — Progress Notes (Signed)
Chart reviewed, agree above plan ?

## 2021-11-02 NOTE — Progress Notes (Signed)
Went in to assess patient, patient somewhat argumentative with staff. Patient vitals stable. Asked patient about what brought her to the hospital. She states she was "told she fell". Does not remember falling, and denies falling. While assessing patient's skin, she stated she didn't fall on her bottom. She had bruises to her back, and a laceration to her scalp. On her bottom, it is thickened pink, with small pinpoint area to the sacrum that is red. Nothing is open. Patient refused to remove her shoes. She has a bracelet to the left arm, and two to her right arm. She had no other belongings with her other than clothes, which were bagged and labeled.

## 2021-11-02 NOTE — H&P (Signed)
History and Physical  [name] GDJ:[id number] DOB: [birth year] DOA: 11/02/2021  Referring physician: Dr Alvino Chapel, ED physician PCP: Asencion Noble, MD  Outpatient Specialists:   Patient Coming From: home  Chief Complaint: fall  HPI: [name] is a 80 y.o. female with a history of alcohol dependence, alcohol liver disease, hypertension, atherosclerosis, GERD, osteoporosis, hypertension.  Patient is an unreliable historian as there is some concern that she has dementia.  She presents after having a fall at home that was unwitnessed.  Her caregiver found her on the floor when she went to check on her today and the patient was unable to get up.  Patient is confused somewhat initially on arrival and gradually returned back to baseline.  She had a laceration on her head and was brought to the hospital for evaluation.  No palliating or provoking factors.  She seems to be regaining her baseline.  The patient is on some medications, but she is unable to say what medications she takes.  According to the medical records, she is on a potassium supplement, on Cozaar, and on spironolactone.  Emergency Department Course: CBC shows count of 12.  Patient afebrile.  Potassium is 6.4 and a creatinine is 1.97.  CT head, chest, abdomen pelvis without acute disease.  Patient was given Lokelma, albuterol, IV fluids for the hyperkalemia.  Review of Systems:   Pt denies any fevers, chills, nausea, vomiting, diarrhea, constipation, abdominal pain, shortness of breath, dyspnea on exertion, orthopnea, cough, wheezing, palpitations, headache, vision changes, lightheadedness, dizziness, melena, rectal bleeding.  Review of systems are otherwise negative  Past Medical History:  Diagnosis Date   Abdominal wall hernia, periumbilical, fat-containing 05/19/2021   CT AP 05/18/21: Fat containing periumbilical anterior abdominal wall hernia.  Second fat and bowel containing infraumbilical anterior abdominal wall  hernia. No CT evidence of bowel incarceration.   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   Depression    GERD (gastroesophageal reflux disease)    Kidney stones    Liver disease    Macrocytic anemia 05/19/2021   Mitral valve regurgitation, moderate 05/22/2021   Multiple closed fractures of ribs of left side 05/18/2021   Osteoporosis    Osteoporosis    Treated with denosumab   Physical deconditioning    Recurrent falls    Recurrent kidney stones    Calcium oxalate monohydrate on stone analysis 05/2020 at [facility] urology   Seizure [facility])    started on Keppra after [facility]   Subarachnoid bleed ([place])    Subarachnoid hemorrhage ([place]) 12/19/2019   Past Surgical History:  Procedure Laterality Date   ABDOMINAL HYSTERECTOMY     vaginal   ABDOMINAL HYSTERECTOMY     Vaginal   APPENDECTOMY     CARPAL TUNNEL RELEASE  01/21/2012   Procedure: CARPAL TUNNEL RELEASE;  Surgeon: Wynonia Sours, MD;  Location: [place];  Service:  Orthopedics;  Laterality: Right;   CARPAL TUNNEL RELEASE Right 01/02/2012   Dr Fredna Dow ([place])   COLON RESECTION  2004   perf bowel after colonoscopy   CYSTOSCOPY W/ RETROGRADES Bilateral 02/28/2018   Procedure: CYSTOSCOPY WITH BILATERAL RETROGRADE PYELOGRAM;BILATERAL URETERAL STENT PLACEMENT;  Surgeon: Cleon Gustin, MD;  Location: [facility];  Service: Urology;  Laterality: Bilateral;   CYSTOSCOPY W/  URETERAL STENT PLACEMENT  2010   lt-lazer stone   CYSTOSCOPY W/ URETERAL STENT PLACEMENT Bilateral 02/29/2020   Procedure: CYSTOSCOPY WITH BILATERAL RETROGRADE PYELOGRAM;BILATERAL URETERAL STENT PLACEMENT;  Surgeon: Cleon Gustin, MD;  Location: [facility];  Service: Urology;  Laterality: Bilateral;   CYSTOSCOPY WITH STENT PLACEMENT Bilateral 09/08/2014   Procedure: CYSTOSCOPY WITH STENT PLACEMENT;  Surgeon: Malka So, MD;  Location: [facility];  Service: Urology;  Laterality: Bilateral;   SMALL INTESTINE SURGERY  2004   Iliectomy   THORACOTOMY  2007   vatz-rt upper lobe   THORACOTOMY  12/01/2005   VATS Right Upper Lung Lobe - Dr Arlyce Dice ([facility])   TONSILLECTOMY     TONSILLECTOMY     Social History:  reports that she quit smoking about 24 years ago. Her smoking use included cigarettes. She has never used smokeless tobacco. She reports current alcohol use. She reports that she does not use drugs. Patient lives at home  Allergies  Allergen Reactions   Clindamycin Rash   Clindamycin/Lincomycin Rash   Doxycycline Rash   Doxycycline Hyclate Rash    Family History  Problem Relation Age of Onset   Other Mother        "old age" - died at age 41   Lung cancer Father       Prior to Admission medications   Medication Sig Start Date End Date Taking? Authorizing Provider  Biotin 2.5 MG TABS Take 1 tablet by mouth daily.     [provider]  Calcium Carb-Cholecalciferol (CALCIUM + D3 PO) Take 1 tablet by mouth daily with breakfast.    [provider]  calcium citrate-vitamin D (CITRACAL+D) 315-200 MG-UNIT tablet Take 1 tablet by mouth 2 (two) times daily. Patient not taking: Reported on 07/29/2021    [provider]  cephALEXin (KEFLEX) 500 MG capsule Take 1 capsule (500 mg total) by mouth 4 (four) times daily. Patient not taking: No sig reported 03/05/20   Kathie Dike, MD  cyanocobalamin (,VITAMIN B-12,) 1000 MCG/ML injection Inject 1,000 mcg into the skin every 30  (thirty) days.  Patient not taking: Reported on 07/29/2021 03/22/19   [provider]  cyanocobalamin (,VITAMIN B-12,) 1000 MCG/ML injection Inject 1,000 mcg into the skin every 30 (thirty) days. Patient not taking: Reported on 07/29/2021 12/06/20   [provider]  cycloSPORINE (RESTASIS) 0.05 % ophthalmic emulsion Place 1 drop into both eyes 2 (two) times daily.     [provider]  denosumab (PROLIA) 60 MG/ML SOSY injection Inject 60 mg into the skin every 6 (six) months.    Asencion Noble, MD  denosumab (PROLIA) 60 MG/ML SOSY injection Inject 60 mg into the skin every 6 (six) months. Patient not taking: Reported on 07/29/2021    [provider]  escitalopram (LEXAPRO) 20 MG tablet Take 1 tablet (20 mg total) by mouth in the morning. 05/25/21   Lilland, Alana, DO  Escitalopram Oxalate (LEXAPRO PO) Take 10 mg by mouth daily.  Patient not taking: Reported on 07/29/2021    [provider]  folic acid (FOLVITE) 1 MG tablet Take 1 tablet (1  mg total) by mouth daily. 05/25/21   Lilland, Alana, DO  HYDROcodone-acetaminophen (NORCO/VICODIN) 5-325 MG tablet Take 1 tablet by mouth every 4 (four) hours as needed. Patient not taking: Reported on 07/29/2021 02/29/20   Ezequiel Essex, MD  losartan (COZAAR) 25 MG tablet Take 1 tablet (25 mg total) by mouth daily. 10/09/21   Imogene Burn, PA-C  metoprolol succinate (TOPROL-XL) 100 MG 24 hr tablet Take 1 tablet (100 mg total) by mouth in the morning. Take with or immediately following a meal. 10/09/21   Imogene Burn, PA-C  Multiple Vitamin (MULTIVITAMIN) capsule Take 1 capsule by mouth daily.    [provider]  ondansetron (ZOFRAN ODT) 4 MG disintegrating tablet Take 1 tablet (4 mg total) by mouth every 8 (eight) hours as needed for nausea or vomiting. Patient not taking: Reported on 07/29/2021 02/29/20   Ezequiel Essex, MD  potassium citrate (UROCIT-K) 10 MEQ (1080 MG) SR tablet Take 10 mEq by mouth daily.   03/22/19   [provider]  RESTASIS 0.05 % ophthalmic emulsion Place 1 drop into both eyes 2 (two) times daily. Patient not taking: Reported on 07/29/2021 04/09/21   [provider]  spironolactone (ALDACTONE) 25 MG tablet Take 1 tablet (25 mg total) by mouth daily. 09/24/21   Imogene Burn, PA-C  thiamine 100 MG tablet Take 100 mg by mouth daily.     [provider]  thiamine 100 MG tablet Take 100 mg by mouth in the morning. Patient not taking: Reported on 07/29/2021 04/30/21   [provider]  levETIRAcetam (KEPPRA) 500 MG tablet Take 500 mg by mouth in the morning. 04/30/21 05/18/21  [provider]    Physical Exam: BP 119/76 (BP Location: Left Arm)   Pulse (!) 115   Temp 98.4 F (36.9 C)   Resp (!) 23   Ht 5' (1.524 m)   SpO2 95%   BMI 16.48 kg/m   General: Elderly female. Awake and alert and oriented x3. No acute cardiopulmonary distress.  HEENT: Normocephalic atraumatic.  Laceration repaired in right scalp.  Right and left ears normal in appearance.  Pupils equal, round, reactive to light. Extraocular muscles are intact. Sclerae anicteric and noninjected.  Moist mucosal membranes. No mucosal lesions.  Neck: Neck supple without lymphadenopathy. No carotid bruits. No masses palpated.  Cardiovascular: Regular rate with normal S1-S2 sounds. No murmurs, rubs, gallops auscultated. No JVD.  Respiratory: Good respiratory effort with no wheezes, rales, rhonchi. Lungs clear to auscultation bilaterally.  No accessory muscle use. Abdomen: Soft, nontender, nondistended. Active bowel sounds. No masses or hepatosplenomegaly  Skin: No rashes, lesions, or ulcerations.  Dry, warm to touch. 2+ dorsalis pedis and radial pulses. Musculoskeletal: No calf or leg pain. All major joints not erythematous nontender.  No upper or lower joint deformation.  Good ROM.  No contractures  Psychiatric: Intact judgment and insight. Pleasant and cooperative. Neurologic: No  focal neurological deficits. Strength is 5/5 and symmetric in upper and lower extremities.  Cranial nerves II through XII are grossly intact.           Labs on Admission: I have personally reviewed following labs and imaging studies  CBC: Recent Labs  Lab 11/02/21 1206  WBC 12.0*  HGB 10.7*  HCT 31.8*  MCV 99.4  PLT 161   Basic Metabolic Panel: Recent Labs  Lab 11/02/21 1206  NA 131*  K 6.4*  CL 102  CO2 17*  GLUCOSE 89  BUN 46*  CREATININE 1.97*  CALCIUM  8.9   GFR: CrCl cannot be calculated (Unknown ideal weight.). Liver Function Tests: No results for input(s): AST, ALT, ALKPHOS, BILITOT, PROT, ALBUMIN in the last 168 hours. No results for input(s): LIPASE, AMYLASE in the last 168 hours. No results for input(s): AMMONIA in the last 168 hours. Coagulation Profile: No results for input(s): INR, PROTIME in the last 168 hours. Cardiac Enzymes: No results for input(s): CKTOTAL, CKMB, CKMBINDEX, TROPONINI in the last 168 hours. BNP (last 3 results) No results for input(s): PROBNP in the last 8760 hours. HbA1C: No results for input(s): HGBA1C in the last 72 hours. CBG: No results for input(s): GLUCAP in the last 168 hours. Lipid Profile: No results for input(s): CHOL, HDL, LDLCALC, TRIG, CHOLHDL, LDLDIRECT in the last 72 hours. Thyroid Function Tests: No results for input(s): TSH, T4TOTAL, FREET4, T3FREE, THYROIDAB in the last 72 hours. Anemia Panel: No results for input(s): VITAMINB12, FOLATE, FERRITIN, TIBC, IRON, RETICCTPCT in the last 72 hours. Urine analysis:    Component Value Date/Time   COLORURINE AMBER (A) 05/18/2021 1236   APPEARANCEUR TURBID (A) 05/18/2021 1236   LABSPEC 1.029 05/18/2021 1236   PHURINE 5.0 05/18/2021 1236   GLUCOSEU NEGATIVE 05/18/2021 1236   HGBUR LARGE (A) 05/18/2021 1236   BILIRUBINUR NEGATIVE 05/18/2021 1236   KETONESUR 5 (A) 05/18/2021 1236   PROTEINUR 30 (A) 05/18/2021 1236   UROBILINOGEN 0.2 09/08/2014 0300   NITRITE  NEGATIVE 05/18/2021 1236   LEUKOCYTESUR MODERATE (A) 05/18/2021 1236   Sepsis Labs: @LABRCNTIP (procalcitonin:4,lacticidven:4) ) Recent Results (from the past 240 hour(s))  Resp Panel by RT-PCR (Flu A&B, Covid) Nasopharyngeal Swab     Status: None   Collection Time: 11/02/21  2:57 PM   Specimen: Nasopharyngeal Swab; Nasopharyngeal(NP) swabs in vial transport medium  Result Value Ref Range Status   SARS Coronavirus 2 by RT PCR NEGATIVE NEGATIVE Final    Comment: (NOTE) SARS-CoV-2 target nucleic acids are NOT DETECTED.  The SARS-CoV-2 RNA is generally detectable in upper respiratory specimens during the acute phase of infection. The lowest concentration of SARS-CoV-2 viral copies this assay can detect is 138 copies/mL. A negative result does not preclude SARS-Cov-2 infection and should not be used as the sole basis for treatment or other patient management decisions. A negative result may occur with  improper specimen collection/handling, submission of specimen other than nasopharyngeal swab, presence of viral mutation(s) within the areas targeted by this assay, and inadequate number of viral copies(<138 copies/mL). A negative result must be combined with clinical observations, patient history, and epidemiological information. The expected result is Negative.  Fact Sheet for Patients:  EntrepreneurPulse.com.au  Fact Sheet for Healthcare Providers:  IncredibleEmployment.be  This test is no t yet approved or cleared by the [place] FDA and  has been authorized for detection and/or diagnosis of SARS-CoV-2 by FDA under an Emergency Use Authorization (EUA). This EUA will remain  in effect (meaning this test can be used) for the duration of the COVID-19 declaration under Section 564(b)(1) of the Act, 21 U.S.C.section 360bbb-3(b)(1), unless the authorization is terminated  or revoked sooner.       Influenza A by PCR NEGATIVE NEGATIVE Final    Influenza B by PCR NEGATIVE NEGATIVE Final    Comment: (NOTE) The Xpert Xpress SARS-CoV-2/FLU/RSV plus assay is intended as an aid in the diagnosis of influenza from Nasopharyngeal swab specimens and should not be used as a sole basis for treatment. Nasal washings and aspirates are unacceptable for Xpert Xpress SARS-CoV-2/FLU/RSV testing.  Fact Sheet for Patients:  EntrepreneurPulse.com.au  Fact Sheet for Healthcare Providers: IncredibleEmployment.be  This test is not yet approved or cleared by the [place] FDA and has been authorized for detection and/or diagnosis of SARS-CoV-2 by FDA under an Emergency Use Authorization (EUA). This EUA will remain in effect (meaning this test can be used) for the duration of the COVID-19 declaration under Section 564(b)(1) of the Act, 21 U.S.C. section 360bbb-3(b)(1), unless the authorization is terminated or revoked.  Performed at [facility], [address] [place],  [address]      Radiological Exams on Admission: CT Head Wo Contrast  Result Date: 11/02/2021 CLINICAL DATA:  Multiple falls EXAM: CT HEAD WITHOUT CONTRAST CT CERVICAL SPINE WITHOUT CONTRAST TECHNIQUE: Multidetector CT imaging of the head and cervical spine was performed following the standard protocol without intravenous contrast. Multiplanar CT image reconstructions of the cervical spine were also generated. COMPARISON:  05/18/2021 FINDINGS: CT HEAD FINDINGS Brain: No evidence of acute infarction, hemorrhage, hydrocephalus, extra-axial collection or mass lesion/mass effect. Mild periventricular and deep white matter hypodensity. Mild global cerebral volume loss. Vascular: No hyperdense vessel or unexpected calcification. Skull: Normal. Negative for fracture or focal lesion. Sinuses/Orbits: No acute finding. Other: None. CT CERVICAL SPINE FINDINGS Alignment: Normal. Skull base and vertebrae: No acute fracture. No primary bone lesion or focal  pathologic process. Soft tissues and spinal canal: No prevertebral fluid or swelling. No visible canal hematoma. Disc levels: Moderate multilevel disc space height loss and osteophytosis. Upper chest: Wedge resection of the right pulmonary apex. Other: None. IMPRESSION: 1. No acute intracranial pathology. Small-vessel white matter disease and global cerebral volume loss. 2. No fracture or static subluxation of the cervical spine. 3. Moderate multilevel cervical disc degenerative disease. Electronically Signed   By: Delanna Ahmadi M.D.   On: 11/02/2021 12:26   CT Cervical Spine Wo Contrast  Result Date: 11/02/2021 CLINICAL DATA:  Multiple falls EXAM: CT HEAD WITHOUT CONTRAST CT CERVICAL SPINE WITHOUT CONTRAST TECHNIQUE: Multidetector CT imaging of the head and cervical spine was performed following the standard protocol without intravenous contrast. Multiplanar CT image reconstructions of the cervical spine were also generated. COMPARISON:  05/18/2021 FINDINGS: CT HEAD FINDINGS Brain: No evidence of acute infarction, hemorrhage, hydrocephalus, extra-axial collection or mass lesion/mass effect. Mild periventricular and deep white matter hypodensity. Mild global cerebral volume loss. Vascular: No hyperdense vessel or unexpected calcification. Skull: Normal. Negative for fracture or focal lesion. Sinuses/Orbits: No acute finding. Other: None. CT CERVICAL SPINE FINDINGS Alignment: Normal. Skull base and vertebrae: No acute fracture. No primary bone lesion or focal pathologic process. Soft tissues and spinal canal: No prevertebral fluid or swelling. No visible canal hematoma. Disc levels: Moderate multilevel disc space height loss and osteophytosis. Upper chest: Wedge resection of the right pulmonary apex. Other: None. IMPRESSION: 1. No acute intracranial pathology. Small-vessel white matter disease and global cerebral volume loss. 2. No fracture or static subluxation of the cervical spine. 3. Moderate multilevel  cervical disc degenerative disease. Electronically Signed   By: Delanna Ahmadi M.D.   On: 11/02/2021 12:26   CT CHEST ABDOMEN PELVIS WO CONTRAST  Result Date: 11/02/2021 CLINICAL DATA:  Fall, trauma EXAM: CT CHEST, ABDOMEN AND PELVIS WITHOUT CONTRAST TECHNIQUE: Multidetector CT imaging of the chest, abdomen and pelvis was performed following the standard protocol without IV contrast. COMPARISON:  05/18/2021 FINDINGS: CT CHEST FINDINGS Cardiovascular: Aortic atherosclerosis. Normal heart size. Left coronary artery calcifications no pericardial effusion. Mediastinum/Nodes: No enlarged mediastinal, hilar, or axillary lymph nodes. Thyroid gland, trachea, and esophagus demonstrate no  significant findings. Lungs/Pleura: Wedge resection of the right upper lobe. No pleural effusion or pneumothorax. Musculoskeletal: No chest wall mass or suspicious bone lesions identified. Multiple chronic, callused fractures of the bilateral ribs. CT ABDOMEN PELVIS FINDINGS Hepatobiliary: No solid liver abnormality is seen. No gallstones, gallbladder wall thickening, or biliary dilatation. Pancreas: Unremarkable. No pancreatic ductal dilatation or surrounding inflammatory changes. Spleen: Normal in size without significant abnormality. Adrenals/Urinary Tract: Adrenal glands are unremarkable. Multiple small bilateral nonobstructive renal calculi. No ureteral calculi or hydronephrosis. Bladder is unremarkable. Stomach/Bowel: Stomach is within normal limits. Status post partial right hemicolectomy and ileocolic reanastomosis. No evidence of bowel wall thickening, distention, or inflammatory changes. Vascular/Lymphatic: No significant vascular findings are present. No enlarged abdominal or pelvic lymph nodes. Reproductive: Status post hysterectomy. Other: Small midline ventral abdominal hernia containing a single nonobstructed loop of transverse colon (series 2, image 75). No abdominopelvic ascites. Musculoskeletal: No acute or significant  osseous findings. IMPRESSION: 1. No noncontrast evidence of acute traumatic injury to the chest, abdomen, or pelvis. 2. Multiple chronic, callused fractures of the bilateral ribs. 3. Bilateral nonobstructive nephrolithiasis. 4. Wedge resection of the right upper lobe. 5. Partial right hemicolectomy and ileocolic anastomosis. 6. Small midline ventral abdominal hernia containing a single nonobstructed loop of transverse colon. 7. Coronary artery disease. Aortic Atherosclerosis (ICD10-I70.0). Electronically Signed   By: Delanna Ahmadi M.D.   On: 11/02/2021 14:06    EKG: Independently reviewed.  Sinus rhythm.  Mild peaked T's.  Left atrial enlargement.  Possible old lateral infarct that is age indeterminant.  No acute ST changes.  Assessment/Plan: Principal Problem:   Hyperkalemia Active Problems:   AKI (acute kidney injury) ([place])   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[place])   Fall at home, initial encounter    This patient was discussed with the ED physician, including pertinent vitals, physical exam findings, labs, and imaging.  We also discussed care given by the ED provider.  Hyperkalemia Admit Telemetry monitoring Patient status post Lokelma Recheck BMP and potassium every 4 hours Acute renal injury Check creatinine IV fluids (NS) Alcohol dependence Withdrawal protocol Thiamine and folic acid supplementation Fall at home Hypertension Hold cozaar, which can be a cause of hyperkalemia, although usually limited to diabetics with nephropathy. Continue metoprolol Possible dementia   DVT prophylaxis: Lovenox Consultants: none Code Status: Full Family Communication: none  Disposition Plan:    Truett Mainland, DO

## 2021-11-02 NOTE — ED Notes (Signed)
CAREGIVER SONYA # [phone]

## 2021-11-02 NOTE — ED Triage Notes (Signed)
Patient bib ems patient had fallen and patient unsure how she fell.  Patient reports she fell yesterday but fell again today

## 2021-11-02 NOTE — Progress Notes (Signed)
Pt arrived to the floor at 1743 via wheelchair by ED staff. Pt is resting comfortably in room. Bed in lowest position and call bell within reach.

## 2021-11-02 NOTE — ED Provider Notes (Signed)
[facility] EMERGENCY DEPARTMENT Provider Note   CSN: [id number] Arrival date & time: 11/02/21  1045     History Chief Complaint  Patient presents with   [name]    [name] is a 80 y.o. female. Level 5 caveat due to some confusion.  Fall Patient presents after reported fall.  Unsure what happened.  Reportedly had a fall yesterday and today.  Patient is here with caregiver that comes check with her.  Found on the floor she reportedly could not get up.  Patient is somewhat confused.  Patient's caregiver states she is actually close to her baseline and probably nicer than her normal baseline.     Past Medical History:  Diagnosis Date   Abdominal wall hernia, periumbilical, fat-containing 05/19/2021   CT AP 05/18/21: Fat containing periumbilical anterior abdominal wall hernia.  Second fat and bowel containing infraumbilical anterior abdominal wall hernia. No CT evidence of bowel incarceration.   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   Depression    GERD (gastroesophageal reflux disease)    Kidney stones    Liver disease    Macrocytic anemia 05/19/2021   Mitral valve regurgitation, moderate 05/22/2021   Multiple closed fractures of ribs of left side 05/18/2021   Osteoporosis    Osteoporosis    Treated with denosumab   Physical deconditioning    Recurrent falls    Recurrent kidney stones    Calcium oxalate monohydrate  on stone analysis 05/2020 at [facility] urology   Seizure [facility])    started on Keppra after [facility]   Subarachnoid bleed ([place])    Subarachnoid hemorrhage ([place]) 12/19/2019    Patient Active Problem List   Diagnosis Date Noted   Regional wall motion abnormality of heart 05/22/2021   Malnutrition of moderate degree 05/22/2021   Cardiomyopathy, alcoholic (HCC)    Acute on chronic systolic heart failure (HCC)    Protein-calorie malnutrition, severe ([place]) 05/21/2021   Cognitive impairment    Mitral valve regurgitation, moderate 05/20/2021   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)    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 05/19/2021   Cerebral atrophy ([place]) 05/19/2021   Generalized weakness 05/19/2021   Rhabdomyolysis 01/74/9449   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[place]) 05/19/2021   Atherosclerosis of aorta ([place]) 05/19/2021   Multiple skin tears 05/19/2021   Multiple Bruising sites 05/19/2021   Poor historian 05/19/2021   Acute renal injury ([place]) 05/19/2021   Transaminitis 05/19/2021   Bilirubinemia 05/19/2021   Hypoproteinemia ([place]) 05/19/2021   Elevated troponin 05/19/2021   Vitamin B12 deficiency 05/19/2021   Macrocytic anemia 05/19/2021   Thrombocytopenia ([place]) 05/19/2021   TSH elevation 05/19/2021   Pyuria 05/19/2021   Hematuria 05/19/2021   Dehydration 05/19/2021   Cerebral atrophy ([place]) 05/19/2021   Degenerative disc disease, cervical 05/19/2021   Bilateral Periorbital ecchymosis 05/19/2021   Myocardial injury    Elevated CK  Fall 05/18/2021   Multiple closed fractures of ribs of left side 05/18/2021   Alteration consciousness 12/10/2020   Gait abnormality 12/10/2020   Sepsis ([place]) 02/29/2020   Acute lower UTI 02/29/2020   Unsteady gait 01/10/2020   Seizures ([place]) 01/10/2020   Osteoarthritis of left knee    AKI (acute kidney injury) ([place]) 12/19/2019   Physical deconditioning 12/19/2019   Alcohol dependence ([place]) 12/19/2019   Thrombocytopenia ([place])  12/19/2019   Subarachnoid hemorrhage ([place]) 12/19/2019   SAH (subarachnoid hemorrhage) ([place])    Transaminasemia    Subarachnoid bleed ([place]) 12/18/2019   Hypokalemia 12/18/2019   Depression    Hyperbilirubinemia    Hyponatremia    Macrocytic anemia    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[place])    Arthritis 05/10/2019   Diarrhea 10/13/2017   Recurrent kidney stones 10/31/2014   Left ureteral stone 09/08/2014   Right ureteral stone 09/08/2014   Infection of urinary tract 09/08/2014   Pain in joint, shoulder region 08/09/2012   Muscle weakness (generalized) 08/09/2012   Impingement syndrome of left shoulder 08/09/2012    Past Surgical History:  Procedure Laterality Date   ABDOMINAL HYSTERECTOMY     vaginal   ABDOMINAL HYSTERECTOMY     Vaginal   APPENDECTOMY     CARPAL TUNNEL RELEASE  01/21/2012   Procedure: CARPAL TUNNEL RELEASE;  Surgeon: Wynonia Sours, MD;  Location: [facility];  Service: Orthopedics;  Laterality: Right;   CARPAL TUNNEL RELEASE Right 01/02/2012   Dr Fredna Dow ([place])   COLON RESECTION  2004   perf bowel after colonoscopy   CYSTOSCOPY W/ RETROGRADES Bilateral 02/28/2018   Procedure: CYSTOSCOPY WITH BILATERAL RETROGRADE PYELOGRAM;BILATERAL URETERAL STENT PLACEMENT;  Surgeon: Cleon Gustin, MD;  Location: [facility];  Service: Urology;  Laterality: Bilateral;   CYSTOSCOPY W/ URETERAL STENT PLACEMENT  2010   lt-lazer stone   CYSTOSCOPY W/ URETERAL STENT PLACEMENT Bilateral 02/29/2020   Procedure: CYSTOSCOPY WITH BILATERAL RETROGRADE PYELOGRAM;BILATERAL URETERAL STENT PLACEMENT;  Surgeon: Cleon Gustin, MD;  Location: [facility];  Service: Urology;  Laterality: Bilateral;   CYSTOSCOPY WITH STENT PLACEMENT Bilateral 09/08/2014   Procedure: CYSTOSCOPY WITH STENT PLACEMENT;  Surgeon: Malka So, MD;  Location: [facility];  Service: Urology;  Laterality: Bilateral;   SMALL INTESTINE SURGERY  2004   Iliectomy   THORACOTOMY  2007   vatz-rt upper lobe   THORACOTOMY   12/01/2005   VATS Right Upper Lung Lobe - Dr Arlyce Dice ([facility])   TONSILLECTOMY     TONSILLECTOMY       OB History   No obstetric history on file.     Family History  Problem Relation Age of Onset   Other Mother        "old age" - died at age 22   Lung cancer Father     Social History   Tobacco Use   Smoking status: Former    Types: Cigarettes    Quit date: 12/01/1996    Years since quitting: 24.9   Smokeless tobacco: Never  Vaping Use   Vaping Use: Never used  Substance Use Topics   Alcohol use: Yes    Comment: 5 cartons of wine a week.   Drug use: No    Home Medications Prior to Admission medications   Medication Sig Start Date End Date Taking? Authorizing Provider  Biotin 2.5 MG TABS Take 1 tablet by mouth daily.     [provider]  Calcium Carb-Cholecalciferol (CALCIUM + D3 PO) Take 1 tablet by mouth  daily with breakfast.    [provider]  calcium citrate-vitamin D (CITRACAL+D) 315-200 MG-UNIT tablet Take 1 tablet by mouth 2 (two) times daily. Patient not taking: Reported on 07/29/2021    [provider]  cephALEXin (KEFLEX) 500 MG capsule Take 1 capsule (500 mg total) by mouth 4 (four) times daily. Patient not taking: No sig reported 03/05/20   Kathie Dike, MD  cyanocobalamin (,VITAMIN B-12,) 1000 MCG/ML injection Inject 1,000 mcg into the skin every 30 (thirty) days.  Patient not taking: Reported on 07/29/2021 03/22/19   [provider]  cyanocobalamin (,VITAMIN B-12,) 1000 MCG/ML injection Inject 1,000 mcg into the skin every 30 (thirty) days. Patient not taking: Reported on 07/29/2021 12/06/20   [provider]  cycloSPORINE (RESTASIS) 0.05 % ophthalmic emulsion Place 1 drop into both eyes 2 (two) times daily.     [provider]  denosumab (PROLIA) 60 MG/ML SOSY injection Inject 60 mg into the skin every 6 (six) months.    Asencion Noble, MD  denosumab (PROLIA) 60 MG/ML SOSY injection Inject 60 mg into the skin every  6 (six) months. Patient not taking: Reported on 07/29/2021    [provider]  escitalopram (LEXAPRO) 20 MG tablet Take 1 tablet (20 mg total) by mouth in the morning. 05/25/21   Lilland, Alana, DO  Escitalopram Oxalate (LEXAPRO PO) Take 10 mg by mouth daily.  Patient not taking: Reported on 07/29/2021    [provider]  folic acid (FOLVITE) 1 MG tablet Take 1 tablet (1 mg total) by mouth daily. 05/25/21   Lilland, Alana, DO  HYDROcodone-acetaminophen (NORCO/VICODIN) 5-325 MG tablet Take 1 tablet by mouth every 4 (four) hours as needed. Patient not taking: Reported on 07/29/2021 02/29/20   Ezequiel Essex, MD  losartan (COZAAR) 25 MG tablet Take 1 tablet (25 mg total) by mouth daily. 10/09/21   Imogene Burn, PA-C  metoprolol succinate (TOPROL-XL) 100 MG 24 hr tablet Take 1 tablet (100 mg total) by mouth in the morning. Take with or immediately following a meal. 10/09/21   Imogene Burn, PA-C  Multiple Vitamin (MULTIVITAMIN) capsule Take 1 capsule by mouth daily.    [provider]  ondansetron (ZOFRAN ODT) 4 MG disintegrating tablet Take 1 tablet (4 mg total) by mouth every 8 (eight) hours as needed for nausea or vomiting. Patient not taking: Reported on 07/29/2021 02/29/20   Ezequiel Essex, MD  potassium citrate (UROCIT-K) 10 MEQ (1080 MG) SR tablet Take 10 mEq by mouth daily.  03/22/19   [provider]  RESTASIS 0.05 % ophthalmic emulsion Place 1 drop into both eyes 2 (two) times daily. Patient not taking: Reported on 07/29/2021 04/09/21   [provider]  spironolactone (ALDACTONE) 25 MG tablet Take 1 tablet (25 mg total) by mouth daily. 09/24/21   Imogene Burn, PA-C  thiamine 100 MG tablet Take 100 mg by mouth daily.     [provider]  thiamine 100 MG tablet Take 100 mg by mouth in the morning. Patient not taking: Reported on 07/29/2021 04/30/21   [provider]  levETIRAcetam (KEPPRA) 500 MG tablet Take 500 mg by mouth in the  morning. 04/30/21 05/18/21  [provider]    Allergies    Clindamycin, Clindamycin/lincomycin, Doxycycline, and Doxycycline hyclate  Review of Systems   Review of Systems  Unable to perform ROS: Mental status change   Physical Exam Updated Vital Signs BP (!) 127/59 (BP Location: Left Arm)   Pulse (!) 119  Temp 98 F (36.7 C) (Oral)   Resp 20   Ht 5' (1.524 m)   SpO2 98%   BMI 16.48 kg/m   Physical Exam Vitals reviewed.  HENT:     Head:     Comments: Tried blood on scalp.  Unclear laceration initially.  Dried Eyes:     Pupils: Pupils are equal, round, and reactive to light.  Neck:     Comments: Tenderness to upper cervical spine. Cardiovascular:     Rate and Rhythm: Normal rate.  Pulmonary:     Breath sounds: No wheezing or rhonchi.  Abdominal:     Tenderness: There is abdominal tenderness.     Comments: Mild diffuse tenderness without rebound or guarding.  No hernia palpated.  Musculoskeletal:        General: Tenderness present.     Cervical back: Neck supple.     Comments: Lumbar tenderness.  No clear deformity.  Also bilateral lower posterior chest tenderness.  Neurological:     Mental Status: She is alert.     Comments: Awake and pleasant but some confusion    ED Results / Procedures / Treatments   Labs (all labs ordered are listed, but only abnormal results are displayed) Labs Reviewed  CBC - Abnormal; Notable for the following components:      Result Value   WBC 12.0 (*)    RBC 3.20 (*)    Hemoglobin 10.7 (*)    HCT 31.8 (*)    All other components within normal limits  BASIC METABOLIC PANEL - Abnormal; Notable for the following components:   Sodium 131 (*)    Potassium 6.4 (*)    CO2 17 (*)    BUN 46 (*)    Creatinine, Ser 1.97 (*)    GFR, Estimated 25 (*)    All other components within normal limits  RESP PANEL BY RT-PCR (FLU A&B, COVID) [id number]  URINALYSIS, ROUTINE W REFLEX MICROSCOPIC  ETHANOL    EKG EKG  Interpretation  Date/Time:  Saturday November 02 2021 10:58:44 EST Ventricular Rate:  82 PR Interval:  189 QRS Duration: 91 QT Interval:  372 QTC Calculation: 435 R Axis:   161 Text Interpretation: Sinus rhythm Consider left atrial enlargement Probable lateral infarct, age indeterminate T wave more prominant Confirmed by Davonna Belling [phone]) on 11/02/2021 12:41:06 PM  Radiology CT Head Wo Contrast  Result Date: 11/02/2021 CLINICAL DATA:  Multiple falls EXAM: CT HEAD WITHOUT CONTRAST CT CERVICAL SPINE WITHOUT CONTRAST TECHNIQUE: Multidetector CT imaging of the head and cervical spine was performed following the standard protocol without intravenous contrast. Multiplanar CT image reconstructions of the cervical spine were also generated. COMPARISON:  05/18/2021 FINDINGS: CT HEAD FINDINGS Brain: No evidence of acute infarction, hemorrhage, hydrocephalus, extra-axial collection or mass lesion/mass effect. Mild periventricular and deep white matter hypodensity. Mild global cerebral volume loss. Vascular: No hyperdense vessel or unexpected calcification. Skull: Normal. Negative for fracture or focal lesion. Sinuses/Orbits: No acute finding. Other: None. CT CERVICAL SPINE FINDINGS Alignment: Normal. Skull base and vertebrae: No acute fracture. No primary bone lesion or focal pathologic process. Soft tissues and spinal canal: No prevertebral fluid or swelling. No visible canal hematoma. Disc levels: Moderate multilevel disc space height loss and osteophytosis. Upper chest: Wedge resection of the right pulmonary apex. Other: None. IMPRESSION: 1. No acute intracranial pathology. Small-vessel white matter disease and global cerebral volume loss. 2. No fracture or static subluxation of the cervical spine. 3. Moderate multilevel cervical disc degenerative disease. Electronically Signed  By: Delanna Ahmadi M.D.   On: 11/02/2021 12:26   CT Cervical Spine Wo Contrast  Result Date: 11/02/2021 CLINICAL DATA:   Multiple falls EXAM: CT HEAD WITHOUT CONTRAST CT CERVICAL SPINE WITHOUT CONTRAST TECHNIQUE: Multidetector CT imaging of the head and cervical spine was performed following the standard protocol without intravenous contrast. Multiplanar CT image reconstructions of the cervical spine were also generated. COMPARISON:  05/18/2021 FINDINGS: CT HEAD FINDINGS Brain: No evidence of acute infarction, hemorrhage, hydrocephalus, extra-axial collection or mass lesion/mass effect. Mild periventricular and deep white matter hypodensity. Mild global cerebral volume loss. Vascular: No hyperdense vessel or unexpected calcification. Skull: Normal. Negative for fracture or focal lesion. Sinuses/Orbits: No acute finding. Other: None. CT CERVICAL SPINE FINDINGS Alignment: Normal. Skull base and vertebrae: No acute fracture. No primary bone lesion or focal pathologic process. Soft tissues and spinal canal: No prevertebral fluid or swelling. No visible canal hematoma. Disc levels: Moderate multilevel disc space height loss and osteophytosis. Upper chest: Wedge resection of the right pulmonary apex. Other: None. IMPRESSION: 1. No acute intracranial pathology. Small-vessel white matter disease and global cerebral volume loss. 2. No fracture or static subluxation of the cervical spine. 3. Moderate multilevel cervical disc degenerative disease. Electronically Signed   By: Delanna Ahmadi M.D.   On: 11/02/2021 12:26   CT CHEST ABDOMEN PELVIS WO CONTRAST  Result Date: 11/02/2021 CLINICAL DATA:  Fall, trauma EXAM: CT CHEST, ABDOMEN AND PELVIS WITHOUT CONTRAST TECHNIQUE: Multidetector CT imaging of the chest, abdomen and pelvis was performed following the standard protocol without IV contrast. COMPARISON:  05/18/2021 FINDINGS: CT CHEST FINDINGS Cardiovascular: Aortic atherosclerosis. Normal heart size. Left coronary artery calcifications no pericardial effusion. Mediastinum/Nodes: No enlarged mediastinal, hilar, or axillary lymph nodes. Thyroid  gland, trachea, and esophagus demonstrate no significant findings. Lungs/Pleura: Wedge resection of the right upper lobe. No pleural effusion or pneumothorax. Musculoskeletal: No chest wall mass or suspicious bone lesions identified. Multiple chronic, callused fractures of the bilateral ribs. CT ABDOMEN PELVIS FINDINGS Hepatobiliary: No solid liver abnormality is seen. No gallstones, gallbladder wall thickening, or biliary dilatation. Pancreas: Unremarkable. No pancreatic ductal dilatation or surrounding inflammatory changes. Spleen: Normal in size without significant abnormality. Adrenals/Urinary Tract: Adrenal glands are unremarkable. Multiple small bilateral nonobstructive renal calculi. No ureteral calculi or hydronephrosis. Bladder is unremarkable. Stomach/Bowel: Stomach is within normal limits. Status post partial right hemicolectomy and ileocolic reanastomosis. No evidence of bowel wall thickening, distention, or inflammatory changes. Vascular/Lymphatic: No significant vascular findings are present. No enlarged abdominal or pelvic lymph nodes. Reproductive: Status post hysterectomy. Other: Small midline ventral abdominal hernia containing a single nonobstructed loop of transverse colon (series 2, image 75). No abdominopelvic ascites. Musculoskeletal: No acute or significant osseous findings. IMPRESSION: 1. No noncontrast evidence of acute traumatic injury to the chest, abdomen, or pelvis. 2. Multiple chronic, callused fractures of the bilateral ribs. 3. Bilateral nonobstructive nephrolithiasis. 4. Wedge resection of the right upper lobe. 5. Partial right hemicolectomy and ileocolic anastomosis. 6. Small midline ventral abdominal hernia containing a single nonobstructed loop of transverse colon. 7. Coronary artery disease. Aortic Atherosclerosis (ICD10-I70.0). Electronically Signed   By: Delanna Ahmadi M.D.   On: 11/02/2021 14:06    Procedures .Marland KitchenLaceration Repair  Date/Time: 11/02/2021 2:57 PM Performed  by: Davonna Belling, MD Authorized by: Davonna Belling, MD   Consent:    Consent obtained:  Verbal   Consent given by:  Patient   Risks, benefits, and alternatives were discussed: yes     Risks discussed:  Infection, pain, retained foreign  body, poor cosmetic result, need for additional repair, nerve damage, poor wound healing and vascular damage Universal protocol:    Procedure explained and questions answered to patient or proxy's satisfaction: yes   Anesthesia:    Anesthesia method:  Local infiltration   Local anesthetic:  Lidocaine 1% WITH epi Laceration details:    Location:  Scalp   Scalp location:  Frontal   Length (cm):  2 Pre-procedure details:    Preparation:  Imaging obtained to evaluate for foreign bodies Exploration:    Limited defect created (wound extended): no     Hemostasis achieved with:  Epinephrine   Contaminated: no   Treatment:    Area cleansed with:  Saline   Amount of cleaning:  Standard   Irrigation method:  Syringe   Visualized foreign bodies/material removed: no     Scar revision: no   Skin repair:    Repair method:  Staples   Number of staples:  2 Approximation:    Approximation:  Close Repair type:    Repair type:  Simple Post-procedure details:    Dressing:  Open (no dressing)   Procedure completion:  Tolerated well, no immediate complications   Medications Ordered in ED Medications  sodium zirconium cyclosilicate (LOKELMA) packet 10 g (10 g Oral Given 11/02/21 1259)  albuterol (PROVENTIL) (2.5 MG/3ML) 0.083% nebulizer solution 10 mg (10 mg Nebulization Given 11/02/21 1339)  sodium chloride 0.9 % bolus 1,000 mL (0 mLs Intravenous Stopped 11/02/21 1437)  lidocaine-EPINEPHrine (XYLOCAINE W/EPI) 2 %-1:200000 (PF) injection 10 mL (10 mLs Infiltration Given by Other 11/02/21 1446)    ED Course  I have reviewed the triage vital signs and the nursing notes.  Pertinent labs & imaging results that were available during my care of the patient  were reviewed by me and considered in my medical decision making (see chart for details).    MDM Rules/Calculators/A&P                           Patient with fall.  Reportedly had difficulty getting up.  Scalp laceration closed.  However has a new renal failure/acute kidney injury.  Creatinine up to 2 but potassium is 6.4.  Appears consistent with dehydration since BUN is elevated.  Fluid bolus given.  Patient's caregiver later brought me aside and said the patient is an alcoholic and will probably go into withdrawal soon.  We will follow this.  However will require admission the hospital.  Has been treated for the hyperkalemia.  Mild peaked T waves on EKG.  Laceration on scalp found to be approximately 2 cm long and towards the frontal area.  It is in the hair.  Anesthetized and 2 staples placed  CRITICAL CARE Performed by: Davonna Belling Total critical care time: 30 minutes Critical care time was exclusive of separately billable procedures and treating other patients. Critical care was necessary to treat or prevent imminent or life-threatening deterioration. Critical care was time spent personally by me on the following activities: development of treatment plan with patient and/or surrogate as well as nursing, discussions with consultants, evaluation of patient's response to treatment, examination of patient, obtaining history from patient or surrogate, ordering and performing treatments and interventions, ordering and review of laboratory studies, ordering and review of radiographic studies, pulse oximetry and re-evaluation of patient's condition.  Final Clinical Impression(s) / ED Diagnoses Final diagnoses:  Fall  Fall, initial encounter  Laceration of scalp, initial encounter  AKI (acute kidney injury) ([place])  Dehydration  Hyperkalemia    Rx / DC Orders ED Discharge Orders     None        Davonna Belling, MD 11/02/21 1458

## 2021-11-03 DIAGNOSIS — E875 Hyperkalemia: Secondary | ICD-10-CM

## 2021-11-03 DIAGNOSIS — E512 Wernicke's encephalopathy: Secondary | ICD-10-CM

## 2021-11-03 DIAGNOSIS — D696 Thrombocytopenia, unspecified: Secondary | ICD-10-CM

## 2021-11-03 DIAGNOSIS — N179 Acute kidney failure, unspecified: Principal | ICD-10-CM

## 2021-11-03 DIAGNOSIS — S0101XA Laceration without foreign body of scalp, initial encounter: Secondary | ICD-10-CM

## 2021-11-03 LAB — COMPREHENSIVE METABOLIC PANEL
ALT: 18 U/L (ref 0–44)
AST: 31 U/L (ref 15–41)
Albumin: 3.4 g/dL — ABNORMAL LOW (ref 3.5–5.0)
Alkaline Phosphatase: 70 U/L (ref 38–126)
Anion gap: 8 (ref 5–15)
BUN: 33 mg/dL — ABNORMAL HIGH (ref 8–23)
CO2: 18 mmol/L — ABNORMAL LOW (ref 22–32)
Calcium: 7.7 mg/dL — ABNORMAL LOW (ref 8.9–10.3)
Chloride: 109 mmol/L (ref 98–111)
Creatinine, Ser: 1.35 mg/dL — ABNORMAL HIGH (ref 0.44–1.00)
GFR, Estimated: 40 mL/min — ABNORMAL LOW (ref >60)
Glucose, Bld: 82 mg/dL (ref 70–99)
Potassium: 3.8 mmol/L (ref 3.5–5.1)
Sodium: 135 mmol/L (ref 135–145)
Total Bilirubin: 0.4 mg/dL (ref 0.3–1.2)
Total Protein: 5.4 g/dL — ABNORMAL LOW (ref 6.5–8.1)

## 2021-11-03 LAB — POTASSIUM
Potassium: 4.5 mmol/L (ref 3.5–5.1)
Potassium: 4.6 mmol/L (ref 3.5–5.1)

## 2021-11-03 LAB — CBC
HCT: 25.4 % — ABNORMAL LOW (ref 36.0–46.0)
Hemoglobin: 8.4 g/dL — ABNORMAL LOW (ref 12.0–15.0)
MCH: 33.5 pg (ref 26.0–34.0)
MCHC: 33.1 g/dL (ref 30.0–36.0)
MCV: 101.2 fL — ABNORMAL HIGH (ref 80.0–100.0)
Platelets: 108 10*3/uL — ABNORMAL LOW (ref 150–400)
RBC: 2.51 MIL/uL — ABNORMAL LOW (ref 3.87–5.11)
RDW: 12.9 % (ref 11.5–15.5)
WBC: 5.5 10*3/uL (ref 4.0–10.5)
nRBC: 0 % (ref 0.0–0.2)

## 2021-11-03 LAB — VITAMIN B12: Vitamin B-12: 465 pg/mL (ref 180–914)

## 2021-11-03 LAB — FOLATE: Folate: 80 ng/mL (ref >5.9)

## 2021-11-03 LAB — CK: Total CK: 181 U/L (ref 38–234)

## 2021-11-03 LAB — LACTIC ACID, PLASMA: Lactic Acid, Venous: 1 mmol/L (ref 0.5–1.9)

## 2021-11-03 LAB — TSH: TSH: 9.548 u[IU]/mL — ABNORMAL HIGH (ref 0.350–4.500)

## 2021-11-03 LAB — MAGNESIUM: Magnesium: 2.1 mg/dL (ref 1.7–2.4)

## 2021-11-03 MED ORDER — ENOXAPARIN SODIUM 30 MG/0.3ML IJ SOSY
30.0000 mg | PREFILLED_SYRINGE | INTRAMUSCULAR | Status: DC
  Administered 2021-11-04 – 2021-11-06 (×3): 30 mg via SUBCUTANEOUS
  Filled 2021-11-03 (×3): qty 0.3

## 2021-11-03 MED ORDER — METOPROLOL SUCCINATE ER 25 MG PO TB24: 12.5000 mg | ORAL_TABLET | Freq: Every morning | ORAL | Status: DC

## 2021-11-03 MED ORDER — THIAMINE HCL 100 MG/ML IJ SOLN
500.0000 mg | Freq: Three times a day (TID) | INTRAVENOUS | Status: AC
  Administered 2021-11-03 – 2021-11-05 (×6): 500 mg via INTRAVENOUS
  Filled 2021-11-03 (×6): qty 5

## 2021-11-03 NOTE — Progress Notes (Signed)
Patient confused, easily get agitated with staff when helping to get up or reposition. Steady and 3 person assist needed to get patient from toilet back to bed. Patient stating "I need to get my cloths, I need to get dressed to go." Patient believed she home at times had to reorient to place. Patient remained agitated with staff. Gave Ativan 1mg  IV. Patient is in bed with bed alarm on, floor mat down to prevent fall. Charge nurse aware of patients condition.

## 2021-11-03 NOTE — Progress Notes (Signed)
Patient restless and stated "I needed to go down and get my box and put my cloths up" attempted to redirect patient and reorient, she then states "I know I'm not at home but I need my cloths" Pt very unsteady on feet with use of front wheel walker, stand by to assistance to prevent fall, Pt agitated by standing by to prevent fall and attempt to trying to get her to sit down. Pt stated "I'm tired of hearing about me possible falling I'm not going to fall." Another nurse and CNA in room to assist with reorient. gave 2mg  of ativan IV. Pt currently in bed with floor mats down and bed alarm on. Charge nurse notified of current status of patient.

## 2021-11-03 NOTE — Progress Notes (Signed)
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  Apparently, the patient was found on the floor by her caretakers who came by to check up on her.  The patient was awake but confused at the time.  The circumstances around her fall are unclear.  The patient is unable to provide any history regarding the circumstances.  However, she has had numerous similar presentations in the past.  There have been no reports of vomiting, diarrhea, chest pain, shortness of breath.  At baseline, the patient lives by herself, but she has caretakers for about 9 hours during the day.  Unfortunately, the patient continues to drink the equivalent of 6 glasses of wine on a daily basis. Notably, the patient was admitted to the hospital from 05/18/2021 to 05/24/2021 with a similar presentation when she was found on the floor at that time.  She was treated for acute kidney injury, rhabdomyolysis.  She had elevated troponins at that time.  Echocardiogram on 05/09/2021 showed EF 35%.  She did develop fluid overload from fluid resuscitation.  Subsequently, she was discharged to [facility] for rehab.  She quit drinking alcohol for about 8 weeks, but after she returned home, she began drinking wine again. In the emergency department, the patient was afebrile hemodynamically stable albeit with soft blood pressures.  BMP showed a sodium 131, potassium 6.4, serum creatinine 1.97, WBC 12.0, hemoglobin 10.7, platelets 164,000.  She had a scalp laceration which was stapled.  CT of the brain and cervical spine were negative for any other acute findings.  CT of the chest and abdomen did not show any  traumatic injuries.  Assessment/Plan: Acute kidney injury -Secondary to volume depletion -Baseline creatinine 0.7-0.9 -Presented with serum creatinine 1.97 -Continue IV fluids  Hyperkalemia -Patient was treated with temporizing and definitive measures with improvement -Continue IV fluids  Wernicke Korsakoff encephalopathy -Unclear if the patient has superimposed Alzheimer's dementia -Start high-dose thiamine  Alcohol abuse -Alcohol withdrawal protocol  Hypertension -Continue low-dose metoprolol succinate -Hold losartan -Hold spironolactone  Thrombocytopenia -Has been intermittent and chronic -Due to chronic alcohol use/alcoholic liver disease  Scalp laceration -Staples placed in the ED 11/29/2021 -CT brain negative for acute findings -Remove staples in 2 weeks     Status is: Inpatient  Remains inpatient appropriate because: severity of illness, confusion        Family Communication:   daughter updated at bedside 12/4  Consultants:  none  Code Status:  FULL  DVT Prophylaxis:  [place] Lovenox   Procedures: As Listed in Progress Note Above  Antibiotics: None       Subjective:   Objective: Vitals:   11/02/21 2200 11/03/21 0107 11/03/21 0236 11/03/21 0545  BP: (!) 120/58 124/66 (!) 104/54 106/85  Pulse: (!) 110 (!) 110 97 (!) 108  Resp: 17 18 18 18   Temp: 98 F (36.7 C) 98 F (36.7 C) 97.9 F (36.6 C) 97.6 F (36.4 C)  TempSrc: Oral Oral Oral Oral  SpO2:  100% 91% 93%  Weight:      Height:        Intake/Output Summary (Last 24 hours) at 11/03/2021 0835 Last data filed at 11/03/2021 0642 Gross per 24 hour  Intake 1886.67 ml  Output 1 ml  Net 1885.67 ml   Weight change:  Exam:  General:  Pt is alert, does follow commands appropriately, not in acute distress HEENT: No icterus, No thrush, No neck mass, [place]/AT Cardiovascular: RRR, S1/S2, no rubs, no gallops Respiratory: CTA bilaterally, no wheezing, no crackles, no rhonchi Abdomen:  Soft/+BS, non tender, non distended, no guarding Extremities: No edema, No lymphangitis, No petechiae, No rashes, no synovitis   Data Reviewed: I have personally reviewed following labs and imaging studies Basic Metabolic Panel: Recent Labs  Lab 11/02/21 1206 11/02/21 1942 11/02/21 2343 11/03/21 0432 11/03/21 0700  NA 131* 131*  --   --  135  K 6.4* 4.3 4.5 4.6 3.8  CL 102 103  --   --  109  CO2 17* 13*  --   --  18*  GLUCOSE 89 131*  --   --  82  BUN 46* 40*  --   --  33*  CREATININE 1.97* 1.85*  --   --  1.35*  CALCIUM 8.9 7.9*  --   --  7.7*  MG  --   --   --   --  2.1   Liver Function Tests: Recent Labs  Lab 11/03/21 0700  AST 31  ALT 18  ALKPHOS 70  BILITOT 0.4  PROT 5.4*  ALBUMIN 3.4*   No results for input(s): LIPASE, AMYLASE in the last 168 hours. No results for input(s): AMMONIA in the last 168 hours. Coagulation Profile: No results for input(s): INR, PROTIME in the last 168 hours. CBC: Recent Labs  Lab 11/02/21 1206 11/03/21 0700  WBC 12.0* 5.5  HGB 10.7* 8.4*  HCT 31.8* 25.4*  MCV 99.4 101.2*  PLT 164 108*   Cardiac Enzymes: Recent Labs  Lab 11/03/21 0700  CKTOTAL 181   BNP: Invalid input(s): POCBNP CBG: No results for input(s): GLUCAP in the last 168 hours. HbA1C: No results for input(s): HGBA1C in the last 72 hours. Urine analysis:    Component Value Date/Time   COLORURINE AMBER (A) 05/18/2021 1236   APPEARANCEUR TURBID (A) 05/18/2021 1236   LABSPEC 1.029 05/18/2021 1236   PHURINE 5.0 05/18/2021 1236   GLUCOSEU NEGATIVE 05/18/2021 1236   HGBUR LARGE (A) 05/18/2021 1236   BILIRUBINUR NEGATIVE 05/18/2021 1236   KETONESUR 5 (A) 05/18/2021 1236   PROTEINUR 30 (A) 05/18/2021 1236   UROBILINOGEN 0.2 09/08/2014 0300   NITRITE NEGATIVE 05/18/2021 1236   LEUKOCYTESUR MODERATE (A) 05/18/2021 1236   Sepsis Labs: @LABRCNTIP (procalcitonin:4,lacticidven:4) ) Recent Results (from the past 240 hour(s))  Resp Panel by RT-PCR (Flu A&B,  Covid) Nasopharyngeal Swab     Status: None   Collection Time: 11/02/21  2:57 PM   Specimen: Nasopharyngeal Swab; Nasopharyngeal(NP) swabs in vial transport medium  Result Value Ref Range Status   SARS Coronavirus 2 by RT PCR NEGATIVE NEGATIVE Final    Comment: (NOTE) SARS-CoV-2 target nucleic acids are NOT DETECTED.  The SARS-CoV-2 RNA is generally detectable in upper respiratory specimens during the acute phase of infection. The lowest concentration of SARS-CoV-2 viral copies this assay can detect is 138 copies/mL. A negative result does not preclude SARS-Cov-2 infection and should not be used as the sole basis for treatment or other patient management decisions. A negative result may occur with  improper specimen collection/handling, submission of specimen other than nasopharyngeal swab, presence of viral mutation(s) within the areas targeted by this assay, and inadequate number of viral copies(<138 copies/mL). A negative result must be combined  with clinical observations, patient history, and epidemiological information. The expected result is Negative.  Fact Sheet for Patients:  EntrepreneurPulse.com.au  Fact Sheet for Healthcare Providers:  IncredibleEmployment.be  This test is no t yet approved or cleared by the [place] FDA and  has been authorized for detection and/or diagnosis of SARS-CoV-2 by FDA under an Emergency Use Authorization (EUA). This EUA will remain  in effect (meaning this test can be used) for the duration of the COVID-19 declaration under Section 564(b)(1) of the Act, 21 U.S.C.section 360bbb-3(b)(1), unless the authorization is terminated  or revoked sooner.       Influenza A by PCR NEGATIVE NEGATIVE Final   Influenza B by PCR NEGATIVE NEGATIVE Final    Comment: (NOTE) The Xpert Xpress SARS-CoV-2/FLU/RSV plus assay is intended as an aid in the diagnosis of influenza from Nasopharyngeal swab specimens and should  not be used as a sole basis for treatment. Nasal washings and aspirates are unacceptable for Xpert Xpress SARS-CoV-2/FLU/RSV testing.  Fact Sheet for Patients: EntrepreneurPulse.com.au  Fact Sheet for Healthcare Providers: IncredibleEmployment.be  This test is not yet approved or cleared by the [place] FDA and has been authorized for detection and/or diagnosis of SARS-CoV-2 by FDA under an Emergency Use Authorization (EUA). This EUA will remain in effect (meaning this test can be used) for the duration of the COVID-19 declaration under Section 564(b)(1) of the Act, 21 U.S.C. section 360bbb-3(b)(1), unless the authorization is terminated or revoked.  Performed at [facility], [address], [place], [place] [address]      Scheduled Meds:  enoxaparin (LOVENOX) injection  40 mg Subcutaneous E95M   folic acid  1 mg Oral Daily   LORazepam  0-4 mg Oral Q6H   Followed by   Derrill Memo ON 11/04/2021] LORazepam  0-4 mg Oral Q12H   metoprolol succinate  100 mg Oral q AM   multivitamin with minerals  1 tablet Oral Daily   thiamine  100 mg Oral Daily   Or   thiamine  100 mg Intravenous Daily   Continuous Infusions:  sodium chloride 100 mL/hr at 11/03/21 8413   calcium gluconate      Procedures/Studies: CT Head Wo Contrast  Result Date: 11/02/2021 CLINICAL DATA:  Multiple falls EXAM: CT HEAD WITHOUT CONTRAST CT CERVICAL SPINE WITHOUT CONTRAST TECHNIQUE: Multidetector CT imaging of the head and cervical spine was performed following the standard protocol without intravenous contrast. Multiplanar CT image reconstructions of the cervical spine were also generated. COMPARISON:  05/18/2021 FINDINGS: CT HEAD FINDINGS Brain: No evidence of acute infarction, hemorrhage, hydrocephalus, extra-axial collection or mass lesion/mass effect. Mild periventricular and deep white matter hypodensity. Mild global cerebral volume loss. Vascular: No hyperdense vessel or  unexpected calcification. Skull: Normal. Negative for fracture or focal lesion. Sinuses/Orbits: No acute finding. Other: None. CT CERVICAL SPINE FINDINGS Alignment: Normal. Skull base and vertebrae: No acute fracture. No primary bone lesion or focal pathologic process. Soft tissues and spinal canal: No prevertebral fluid or swelling. No visible canal hematoma. Disc levels: Moderate multilevel disc space height loss and osteophytosis. Upper chest: Wedge resection of the right pulmonary apex. Other: None. IMPRESSION: 1. No acute intracranial pathology. Small-vessel white matter disease and global cerebral volume loss. 2. No fracture or static subluxation of the cervical spine. 3. Moderate multilevel cervical disc degenerative disease. Electronically Signed   By: Delanna Ahmadi M.D.   On: 11/02/2021 12:26   CT Cervical Spine Wo Contrast  Result Date: 11/02/2021 CLINICAL DATA:  Multiple falls EXAM: CT  HEAD WITHOUT CONTRAST CT CERVICAL SPINE WITHOUT CONTRAST TECHNIQUE: Multidetector CT imaging of the head and cervical spine was performed following the standard protocol without intravenous contrast. Multiplanar CT image reconstructions of the cervical spine were also generated. COMPARISON:  05/18/2021 FINDINGS: CT HEAD FINDINGS Brain: No evidence of acute infarction, hemorrhage, hydrocephalus, extra-axial collection or mass lesion/mass effect. Mild periventricular and deep white matter hypodensity. Mild global cerebral volume loss. Vascular: No hyperdense vessel or unexpected calcification. Skull: Normal. Negative for fracture or focal lesion. Sinuses/Orbits: No acute finding. Other: None. CT CERVICAL SPINE FINDINGS Alignment: Normal. Skull base and vertebrae: No acute fracture. No primary bone lesion or focal pathologic process. Soft tissues and spinal canal: No prevertebral fluid or swelling. No visible canal hematoma. Disc levels: Moderate multilevel disc space height loss and osteophytosis. Upper chest: Wedge  resection of the right pulmonary apex. Other: None. IMPRESSION: 1. No acute intracranial pathology. Small-vessel white matter disease and global cerebral volume loss. 2. No fracture or static subluxation of the cervical spine. 3. Moderate multilevel cervical disc degenerative disease. Electronically Signed   By: Delanna Ahmadi M.D.   On: 11/02/2021 12:26   CT CHEST ABDOMEN PELVIS WO CONTRAST  Result Date: 11/02/2021 CLINICAL DATA:  Fall, trauma EXAM: CT CHEST, ABDOMEN AND PELVIS WITHOUT CONTRAST TECHNIQUE: Multidetector CT imaging of the chest, abdomen and pelvis was performed following the standard protocol without IV contrast. COMPARISON:  05/18/2021 FINDINGS: CT CHEST FINDINGS Cardiovascular: Aortic atherosclerosis. Normal heart size. Left coronary artery calcifications no pericardial effusion. Mediastinum/Nodes: No enlarged mediastinal, hilar, or axillary lymph nodes. Thyroid gland, trachea, and esophagus demonstrate no significant findings. Lungs/Pleura: Wedge resection of the right upper lobe. No pleural effusion or pneumothorax. Musculoskeletal: No chest wall mass or suspicious bone lesions identified. Multiple chronic, callused fractures of the bilateral ribs. CT ABDOMEN PELVIS FINDINGS Hepatobiliary: No solid liver abnormality is seen. No gallstones, gallbladder wall thickening, or biliary dilatation. Pancreas: Unremarkable. No pancreatic ductal dilatation or surrounding inflammatory changes. Spleen: Normal in size without significant abnormality. Adrenals/Urinary Tract: Adrenal glands are unremarkable. Multiple small bilateral nonobstructive renal calculi. No ureteral calculi or hydronephrosis. Bladder is unremarkable. Stomach/Bowel: Stomach is within normal limits. Status post partial right hemicolectomy and ileocolic reanastomosis. No evidence of bowel wall thickening, distention, or inflammatory changes. Vascular/Lymphatic: No significant vascular findings are present. No enlarged abdominal or pelvic  lymph nodes. Reproductive: Status post hysterectomy. Other: Small midline ventral abdominal hernia containing a single nonobstructed loop of transverse colon (series 2, image 75). No abdominopelvic ascites. Musculoskeletal: No acute or significant osseous findings. IMPRESSION: 1. No noncontrast evidence of acute traumatic injury to the chest, abdomen, or pelvis. 2. Multiple chronic, callused fractures of the bilateral ribs. 3. Bilateral nonobstructive nephrolithiasis. 4. Wedge resection of the right upper lobe. 5. Partial right hemicolectomy and ileocolic anastomosis. 6. Small midline ventral abdominal hernia containing a single nonobstructed loop of transverse colon. 7. Coronary artery disease. Aortic Atherosclerosis (ICD10-I70.0). Electronically Signed   By: Delanna Ahmadi M.D.   On: 11/02/2021 14:06    Orson Eva, DO  [facility]  If 7PM-7AM, please contact night-coverage [email or web address] Password [email or web address] 11/03/2021, 8:35 AM   LOS: 1 day

## 2021-11-03 NOTE — Progress Notes (Signed)
PHARMACIST - PHYSICIAN COMMUNICATION  CONCERNING:  Enoxaparin (Lovenox) for DVT Prophylaxis    RECOMMENDATION: Patient was prescribed enoxaprin 40mg  q24 hours for VTE prophylaxis.   Filed Weights   11/02/21 1742  Weight: 39.3 kg (86 lb 10.3 oz)    Body mass index is 16.92 kg/m.  Estimated Creatinine Clearance: 20.6 mL/min (A) (by C-G formula based on SCr of 1.35 mg/dL (H)).   Patient is candidate for enoxaparin 30mg  every 24 hours based on CrCl <29ml/min or Weight <45kg  DESCRIPTION: Pharmacy has adjusted enoxaparin dose per [facility] policy.  Patient is now receiving enoxaparin 30 mg every 24 hours    Ena Dawley, PharmD Clinical Pharmacist  11/03/2021 8:56 AM

## 2021-11-03 NOTE — Progress Notes (Signed)
Pt is confused and has become more agitated through the day. Pt is argumentative with staff that tries to help or reorient her. Pt is aware where her belongings are. Pt's daughter wrote  answers to any questions pt may have on a notebook on bedside table.

## 2021-11-04 DIAGNOSIS — S0101XD Laceration without foreign body of scalp, subsequent encounter: Secondary | ICD-10-CM

## 2021-11-04 DIAGNOSIS — F1027 Alcohol dependence with alcohol-induced persisting dementia: Secondary | ICD-10-CM

## 2021-11-04 LAB — MAGNESIUM: Magnesium: 2 mg/dL (ref 1.7–2.4)

## 2021-11-04 LAB — CBC
HCT: 26.4 % — ABNORMAL LOW (ref 36.0–46.0)
Hemoglobin: 8.2 g/dL — ABNORMAL LOW (ref 12.0–15.0)
MCH: 32.3 pg (ref 26.0–34.0)
MCHC: 31.1 g/dL (ref 30.0–36.0)
MCV: 103.9 fL — ABNORMAL HIGH (ref 80.0–100.0)
Platelets: 82 10*3/uL — ABNORMAL LOW (ref 150–400)
RBC: 2.54 MIL/uL — ABNORMAL LOW (ref 3.87–5.11)
RDW: 12.8 % (ref 11.5–15.5)
WBC: 5.9 10*3/uL (ref 4.0–10.5)
nRBC: 0 % (ref 0.0–0.2)

## 2021-11-04 LAB — BASIC METABOLIC PANEL
Anion gap: 4 — ABNORMAL LOW (ref 5–15)
BUN: 21 mg/dL (ref 8–23)
CO2: 18 mmol/L — ABNORMAL LOW (ref 22–32)
Calcium: 7.6 mg/dL — ABNORMAL LOW (ref 8.9–10.3)
Chloride: 114 mmol/L — ABNORMAL HIGH (ref 98–111)
Creatinine, Ser: 0.93 mg/dL (ref 0.44–1.00)
GFR, Estimated: >60 mL/min (ref >60)
Glucose, Bld: 67 mg/dL — ABNORMAL LOW (ref 70–99)
Potassium: 3.6 mmol/L (ref 3.5–5.1)
Sodium: 136 mmol/L (ref 135–145)

## 2021-11-04 MED ORDER — CALCIUM GLUCONATE-NACL 1-0.675 GM/50ML-% IV SOLN
1.0000 g | Freq: Once | INTRAVENOUS | Status: AC
  Administered 2021-11-04: 1000 mg via INTRAVENOUS
  Filled 2021-11-04: qty 50

## 2021-11-04 MED ORDER — POTASSIUM CHLORIDE IN NACL 20-0.9 MEQ/L-% IV SOLN: INTRAVENOUS | Status: AC

## 2021-11-04 MED ORDER — METOPROLOL TARTRATE 5 MG/5ML IV SOLN
2.5000 mg | Freq: Four times a day (QID) | INTRAVENOUS | Status: DC
Start: 2021-11-04 — End: 2021-11-05
  Administered 2021-11-04 – 2021-11-05 (×3): 2.5 mg via INTRAVENOUS
  Filled 2021-11-04 (×3): qty 5

## 2021-11-04 NOTE — Progress Notes (Addendum)
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  Apparently, the patient was found on the floor by her caretakers who came by to check up on her.  The patient was awake but confused at the time.  The circumstances around her fall are unclear.  The patient is unable to provide any history regarding the circumstances.  However, she has had numerous similar presentations in the past.  There have been no reports of vomiting, diarrhea, chest pain, shortness of breath.  At baseline, the patient lives by herself, but she has caretakers for about 9 hours during the day.  Unfortunately, the patient continues to drink the equivalent of 6 glasses of wine on a daily basis. Notably, the patient was admitted to the hospital from 05/18/2021 to 05/24/2021 with a similar presentation when she was found on the floor at that time.  She was treated for acute kidney injury, rhabdomyolysis.  She had elevated troponins at that time.  Echocardiogram on 05/09/2021 showed EF 35%.  She did develop fluid overload from fluid resuscitation.  Subsequently, she was discharged to [facility] for rehab.  She quit drinking alcohol for about 8 weeks, but after she returned home, she began drinking wine again. In the emergency department, the patient was afebrile hemodynamically stable albeit with soft blood pressures.  BMP showed a sodium 131, potassium 6.4, serum creatinine 1.97, WBC 12.0, hemoglobin 10.7, platelets 164,000.  She had a scalp laceration which was stapled.  CT of the brain and cervical spine were negative for any other acute findings.  CT of the chest and abdomen did not show any  traumatic injuries.   Assessment/Plan: Acute kidney injury -Secondary to volume depletion -Baseline creatinine 0.7-0.9 -Presented with serum creatinine 1.97 -Continue IV fluids>>improved   Hyperkalemia -Patient was treated with temporizing and definitive measures with improvement -Continue IV fluids>>improved  Wernicke Korsakoff encephalopathy -Unclear if the patient has superimposed Alzheimer's dementia -Continue high-dose thiamine -12/5-appears less agitated   Alcohol abuse -Alcohol withdrawal protocol   Hypertension -Continue low-dose metoprolol IV -Hold losartan -Hold spironolactone   Thrombocytopenia -Has been intermittent and chronic -Due to chronic alcohol use/alcoholic liver disease   Scalp laceration -Staples placed in the ED 11/29/2021 -CT brain negative for acute findings -Remove staples in 2 weeks         Status is: Inpatient   Remains inpatient appropriate because: severity of illness, confusion               Family Communication:   daughter updated 12/5   Consultants:  none   Code Status:  FULL   DVT Prophylaxis:  [place] Lovenox     Procedures: As Listed in Progress Note Above   Antibiotics: None   Subjective: Patient is pleasantly confused.  Denies f/c, cp, sob, n/v/d  Objective: Vitals:   11/03/21 2115 11/04/21 0524 11/04/21 0830 11/04/21 1257  BP:  (!) 156/83 113/68 (!) 154/92  Pulse:  88 83 86  Resp:  18 18 19   Temp:  (!) 97.4 F (36.3 C) 97.8 F (36.6 C) 97.6 F (36.4 C)  TempSrc:  Oral Oral Oral  SpO2: 100% 91% 93% 92%  Weight:      Height:  Intake/Output Summary (Last 24 hours) at 11/04/2021 1649 Last data filed at 11/04/2021 1330 Gross per 24 hour  Intake 1067.32 ml  Output --  Net 1067.32 ml   Weight change:  Exam:  General:  Pt is alert, follows commands appropriately, not in acute distress; pleasantly confused HEENT: No icterus, No thrush, No neck mass, [place]/AT Cardiovascular: RRR, S1/S2, no rubs, no  gallops Respiratory: fine bibasilar crackles. No wheeze Abdomen: Soft/+BS, non tender, non distended, no guarding Extremities: No edema, No lymphangitis, No petechiae, No rashes, no synovitis   Data Reviewed: I have personally reviewed following labs and imaging studies Basic Metabolic Panel: Recent Labs  Lab 11/02/21 1206 11/02/21 1942 11/02/21 2343 11/03/21 0432 11/03/21 0700 11/04/21 0355  NA 131* 131*  --   --  135 136  K 6.4* 4.3 4.5 4.6 3.8 3.6  CL 102 103  --   --  109 114*  CO2 17* 13*  --   --  18* 18*  GLUCOSE 89 131*  --   --  82 67*  BUN 46* 40*  --   --  33* 21  CREATININE 1.97* 1.85*  --   --  1.35* 0.93  CALCIUM 8.9 7.9*  --   --  7.7* 7.6*  MG  --   --   --   --  2.1 2.0   Liver Function Tests: Recent Labs  Lab 11/03/21 0700  AST 31  ALT 18  ALKPHOS 70  BILITOT 0.4  PROT 5.4*  ALBUMIN 3.4*   No results for input(s): LIPASE, AMYLASE in the last 168 hours. No results for input(s): AMMONIA in the last 168 hours. Coagulation Profile: No results for input(s): INR, PROTIME in the last 168 hours. CBC: Recent Labs  Lab 11/02/21 1206 11/03/21 0700 11/04/21 0355  WBC 12.0* 5.5 5.9  HGB 10.7* 8.4* 8.2*  HCT 31.8* 25.4* 26.4*  MCV 99.4 101.2* 103.9*  PLT 164 108* 82*   Cardiac Enzymes: Recent Labs  Lab 11/03/21 0700  CKTOTAL 181   BNP: Invalid input(s): POCBNP CBG: No results for input(s): GLUCAP in the last 168 hours. HbA1C: No results for input(s): HGBA1C in the last 72 hours. Urine analysis:    Component Value Date/Time   COLORURINE AMBER (A) 05/18/2021 1236   APPEARANCEUR TURBID (A) 05/18/2021 1236   LABSPEC 1.029 05/18/2021 1236   PHURINE 5.0 05/18/2021 1236   GLUCOSEU NEGATIVE 05/18/2021 1236   HGBUR LARGE (A) 05/18/2021 1236   BILIRUBINUR NEGATIVE 05/18/2021 1236   KETONESUR 5 (A) 05/18/2021 1236   PROTEINUR 30 (A) 05/18/2021 1236   UROBILINOGEN 0.2 09/08/2014 0300   NITRITE NEGATIVE 05/18/2021 1236   LEUKOCYTESUR MODERATE (A)  05/18/2021 1236   Sepsis Labs: @LABRCNTIP (procalcitonin:4,lacticidven:4) ) Recent Results (from the past 240 hour(s))  Resp Panel by RT-PCR (Flu A&B, Covid) Nasopharyngeal Swab     Status: None   Collection Time: 11/02/21  2:57 PM   Specimen: Nasopharyngeal Swab; Nasopharyngeal(NP) swabs in vial transport medium  Result Value Ref Range Status   SARS Coronavirus 2 by RT PCR NEGATIVE NEGATIVE Final    Comment: (NOTE) SARS-CoV-2 target nucleic acids are NOT DETECTED.  The SARS-CoV-2 RNA is generally detectable in upper respiratory specimens during the acute phase of infection. The lowest concentration of SARS-CoV-2 viral copies this assay can detect is 138 copies/mL. A negative result does not preclude SARS-Cov-2 infection and should not be used as the sole basis for treatment or other patient management decisions. A negative result may occur with  improper specimen collection/handling, submission of specimen other than nasopharyngeal swab, presence of viral mutation(s) within the areas targeted by this assay, and inadequate number of viral copies(<138 copies/mL). A negative result must be combined with clinical observations, patient history, and epidemiological information. The expected result is Negative.  Fact Sheet for Patients:  EntrepreneurPulse.com.au  Fact Sheet for Healthcare Providers:  IncredibleEmployment.be  This test is no t yet approved or cleared by the [place] FDA and  has been authorized for detection and/or diagnosis of SARS-CoV-2 by FDA under an Emergency Use Authorization (EUA). This EUA will remain  in effect (meaning this test can be used) for the duration of the COVID-19 declaration under Section 564(b)(1) of the Act, 21 U.S.C.section 360bbb-3(b)(1), unless the authorization is terminated  or revoked sooner.       Influenza A by PCR NEGATIVE NEGATIVE Final   Influenza B by PCR NEGATIVE NEGATIVE Final     Comment: (NOTE) The Xpert Xpress SARS-CoV-2/FLU/RSV plus assay is intended as an aid in the diagnosis of influenza from Nasopharyngeal swab specimens and should not be used as a sole basis for treatment. Nasal washings and aspirates are unacceptable for Xpert Xpress SARS-CoV-2/FLU/RSV testing.  Fact Sheet for Patients: EntrepreneurPulse.com.au  Fact Sheet for Healthcare Providers: IncredibleEmployment.be  This test is not yet approved or cleared by the [place] FDA and has been authorized for detection and/or diagnosis of SARS-CoV-2 by FDA under an Emergency Use Authorization (EUA). This EUA will remain in effect (meaning this test can be used) for the duration of the COVID-19 declaration under Section 564(b)(1) of the Act, 21 U.S.C. section 360bbb-3(b)(1), unless the authorization is terminated or revoked.  Performed at [facility], [address] [place], [place] [address]      Scheduled Meds:  enoxaparin (LOVENOX) injection  30 mg Subcutaneous P61P   folic acid  1 mg Oral Daily   LORazepam  0-4 mg Oral Q6H   Followed by   LORazepam  0-4 mg Oral Q12H   metoprolol succinate  12.5 mg Oral q AM   multivitamin with minerals  1 tablet Oral Daily   Continuous Infusions:  sodium chloride 100 mL/hr at 11/04/21 5093   calcium gluconate     thiamine injection 500 mg (11/04/21 1027)    Procedures/Studies: CT Head Wo Contrast  Result Date: 11/02/2021 CLINICAL DATA:  Multiple falls EXAM: CT HEAD WITHOUT CONTRAST CT CERVICAL SPINE WITHOUT CONTRAST TECHNIQUE: Multidetector CT imaging of the head and cervical spine was performed following the standard protocol without intravenous contrast. Multiplanar CT image reconstructions of the cervical spine were also generated. COMPARISON:  05/18/2021 FINDINGS: CT HEAD FINDINGS Brain: No evidence of acute infarction, hemorrhage, hydrocephalus, extra-axial collection or mass lesion/mass effect. Mild  periventricular and deep white matter hypodensity. Mild global cerebral volume loss. Vascular: No hyperdense vessel or unexpected calcification. Skull: Normal. Negative for fracture or focal lesion. Sinuses/Orbits: No acute finding. Other: None. CT CERVICAL SPINE FINDINGS Alignment: Normal. Skull base and vertebrae: No acute fracture. No primary bone lesion or focal pathologic process. Soft tissues and spinal canal: No prevertebral fluid or swelling. No visible canal hematoma. Disc levels: Moderate multilevel disc space height loss and osteophytosis. Upper chest: Wedge resection of the right pulmonary apex. Other: None. IMPRESSION: 1. No acute intracranial pathology. Small-vessel white matter disease and global cerebral volume loss. 2. No fracture or static subluxation of the cervical spine. 3. Moderate multilevel cervical disc degenerative disease. Electronically Signed   By: Delanna Ahmadi M.D.   On:  11/02/2021 12:26   CT Cervical Spine Wo Contrast  Result Date: 11/02/2021 CLINICAL DATA:  Multiple falls EXAM: CT HEAD WITHOUT CONTRAST CT CERVICAL SPINE WITHOUT CONTRAST TECHNIQUE: Multidetector CT imaging of the head and cervical spine was performed following the standard protocol without intravenous contrast. Multiplanar CT image reconstructions of the cervical spine were also generated. COMPARISON:  05/18/2021 FINDINGS: CT HEAD FINDINGS Brain: No evidence of acute infarction, hemorrhage, hydrocephalus, extra-axial collection or mass lesion/mass effect. Mild periventricular and deep white matter hypodensity. Mild global cerebral volume loss. Vascular: No hyperdense vessel or unexpected calcification. Skull: Normal. Negative for fracture or focal lesion. Sinuses/Orbits: No acute finding. Other: None. CT CERVICAL SPINE FINDINGS Alignment: Normal. Skull base and vertebrae: No acute fracture. No primary bone lesion or focal pathologic process. Soft tissues and spinal canal: No prevertebral fluid or swelling. No  visible canal hematoma. Disc levels: Moderate multilevel disc space height loss and osteophytosis. Upper chest: Wedge resection of the right pulmonary apex. Other: None. IMPRESSION: 1. No acute intracranial pathology. Small-vessel white matter disease and global cerebral volume loss. 2. No fracture or static subluxation of the cervical spine. 3. Moderate multilevel cervical disc degenerative disease. Electronically Signed   By: Delanna Ahmadi M.D.   On: 11/02/2021 12:26   CT CHEST ABDOMEN PELVIS WO CONTRAST  Result Date: 11/02/2021 CLINICAL DATA:  Fall, trauma EXAM: CT CHEST, ABDOMEN AND PELVIS WITHOUT CONTRAST TECHNIQUE: Multidetector CT imaging of the chest, abdomen and pelvis was performed following the standard protocol without IV contrast. COMPARISON:  05/18/2021 FINDINGS: CT CHEST FINDINGS Cardiovascular: Aortic atherosclerosis. Normal heart size. Left coronary artery calcifications no pericardial effusion. Mediastinum/Nodes: No enlarged mediastinal, hilar, or axillary lymph nodes. Thyroid gland, trachea, and esophagus demonstrate no significant findings. Lungs/Pleura: Wedge resection of the right upper lobe. No pleural effusion or pneumothorax. Musculoskeletal: No chest wall mass or suspicious bone lesions identified. Multiple chronic, callused fractures of the bilateral ribs. CT ABDOMEN PELVIS FINDINGS Hepatobiliary: No solid liver abnormality is seen. No gallstones, gallbladder wall thickening, or biliary dilatation. Pancreas: Unremarkable. No pancreatic ductal dilatation or surrounding inflammatory changes. Spleen: Normal in size without significant abnormality. Adrenals/Urinary Tract: Adrenal glands are unremarkable. Multiple small bilateral nonobstructive renal calculi. No ureteral calculi or hydronephrosis. Bladder is unremarkable. Stomach/Bowel: Stomach is within normal limits. Status post partial right hemicolectomy and ileocolic reanastomosis. No evidence of bowel wall thickening, distention, or  inflammatory changes. Vascular/Lymphatic: No significant vascular findings are present. No enlarged abdominal or pelvic lymph nodes. Reproductive: Status post hysterectomy. Other: Small midline ventral abdominal hernia containing a single nonobstructed loop of transverse colon (series 2, image 75). No abdominopelvic ascites. Musculoskeletal: No acute or significant osseous findings. IMPRESSION: 1. No noncontrast evidence of acute traumatic injury to the chest, abdomen, or pelvis. 2. Multiple chronic, callused fractures of the bilateral ribs. 3. Bilateral nonobstructive nephrolithiasis. 4. Wedge resection of the right upper lobe. 5. Partial right hemicolectomy and ileocolic anastomosis. 6. Small midline ventral abdominal hernia containing a single nonobstructed loop of transverse colon. 7. Coronary artery disease. Aortic Atherosclerosis (ICD10-I70.0). Electronically Signed   By: Delanna Ahmadi M.D.   On: 11/02/2021 14:06    Orson Eva, DO  [facility]  If 7PM-7AM, please contact night-coverage [email or web address] Password [email or web address] 11/04/2021, 4:49 PM   LOS: 2 days

## 2021-11-05 ENCOUNTER — Inpatient Hospital Stay (HOSPITAL_COMMUNITY): Payer: Medicare Other

## 2021-11-05 ENCOUNTER — Encounter (HOSPITAL_COMMUNITY): Payer: Self-pay | Admitting: Radiology

## 2021-11-05 DIAGNOSIS — J9601 Acute respiratory failure with hypoxia: Secondary | ICD-10-CM | POA: Insufficient documentation

## 2021-11-05 DIAGNOSIS — R0902 Hypoxemia: Secondary | ICD-10-CM

## 2021-11-05 LAB — COMPREHENSIVE METABOLIC PANEL
ALT: 16 U/L (ref 0–44)
AST: 29 U/L (ref 15–41)
Albumin: 3 g/dL — ABNORMAL LOW (ref 3.5–5.0)
Alkaline Phosphatase: 65 U/L (ref 38–126)
Anion gap: 10 (ref 5–15)
BUN: 14 mg/dL (ref 8–23)
CO2: 14 mmol/L — ABNORMAL LOW (ref 22–32)
Calcium: 8 mg/dL — ABNORMAL LOW (ref 8.9–10.3)
Chloride: 116 mmol/L — ABNORMAL HIGH (ref 98–111)
Creatinine, Ser: 1.17 mg/dL — ABNORMAL HIGH (ref 0.44–1.00)
GFR, Estimated: 47 mL/min — ABNORMAL LOW (ref >60)
Glucose, Bld: 52 mg/dL — ABNORMAL LOW (ref 70–99)
Potassium: 4 mmol/L (ref 3.5–5.1)
Sodium: 140 mmol/L (ref 135–145)
Total Bilirubin: 0.5 mg/dL (ref 0.3–1.2)
Total Protein: 5.2 g/dL — ABNORMAL LOW (ref 6.5–8.1)

## 2021-11-05 LAB — BLOOD GAS, ARTERIAL
Acid-base deficit: 10.1 mmol/L — ABNORMAL HIGH (ref 0.0–2.0)
Bicarbonate: 16.5 mmol/L — ABNORMAL LOW (ref 20.0–28.0)
Drawn by: 27733
FIO2: 36
O2 Saturation: 97 %
Patient temperature: 37.2
pCO2 arterial: 27.3 mmHg — ABNORMAL LOW (ref 32.0–48.0)
pH, Arterial: 7.344 — ABNORMAL LOW (ref 7.350–7.450)
pO2, Arterial: 104 mmHg (ref 83.0–108.0)

## 2021-11-05 LAB — AMMONIA: Ammonia: 13 umol/L (ref 9–35)

## 2021-11-05 LAB — MAGNESIUM: Magnesium: 1.8 mg/dL (ref 1.7–2.4)

## 2021-11-05 LAB — CBC
HCT: 25.7 % — ABNORMAL LOW (ref 36.0–46.0)
Hemoglobin: 8.1 g/dL — ABNORMAL LOW (ref 12.0–15.0)
MCH: 33.3 pg (ref 26.0–34.0)
MCHC: 31.5 g/dL (ref 30.0–36.0)
MCV: 105.8 fL — ABNORMAL HIGH (ref 80.0–100.0)
Platelets: 81 10*3/uL — ABNORMAL LOW (ref 150–400)
RBC: 2.43 MIL/uL — ABNORMAL LOW (ref 3.87–5.11)
RDW: 13 % (ref 11.5–15.5)
WBC: 11.6 10*3/uL — ABNORMAL HIGH (ref 4.0–10.5)
nRBC: 0 % (ref 0.0–0.2)

## 2021-11-05 LAB — D-DIMER, QUANTITATIVE: D-Dimer, Quant: 1.1 ug/mL-FEU — ABNORMAL HIGH (ref 0.00–0.50)

## 2021-11-05 MED ORDER — IOHEXOL 350 MG/ML SOLN
100.0000 mL | Freq: Once | INTRAVENOUS | Status: AC | PRN
  Administered 2021-11-05: 75 mL via INTRAVENOUS

## 2021-11-05 MED ORDER — SODIUM BICARBONATE 650 MG PO TABS
650.0000 mg | ORAL_TABLET | Freq: Two times a day (BID) | ORAL | Status: DC
  Administered 2021-11-05 – 2021-11-07 (×5): 650 mg via ORAL
  Filled 2021-11-05 (×5): qty 1

## 2021-11-05 MED ORDER — METOPROLOL TARTRATE 5 MG/5ML IV SOLN
5.0000 mg | Freq: Four times a day (QID) | INTRAVENOUS | Status: DC
  Administered 2021-11-05 – 2021-11-06 (×4): 5 mg via INTRAVENOUS
  Filled 2021-11-05 (×5): qty 5

## 2021-11-05 NOTE — Evaluation (Addendum)
Physical Therapy Evaluation Patient Details Name: [name] MRN: [id number] DOB: Oct 20, 1941 Today's Date: 11/05/2021  History of Present Illness  [name] is a 80 y.o. female presents after unwitnessed fall at home. She had a scalp laceration which was stapled. CT head, chest, abdomen pelvis, cervical spine without acute disease. PMH: alcohol dependence, alcohol liver disease, HTN, atherosclerosis, GERD, osteoporosis, recurrent falls, subarachnoid hemorrhage   Clinical Impression  Pt admitted with above diagnosis. Pt unable to provide PLOF or home set up, but does state name, DOB, age, year and location appropriately with eyes closed and minimally conversational. Pt slides BLE over to EOB when cued to come to sitting EOB, reaches UE towards therapist and attempts to pull on therapist's hands to upright trunk but quickly gives up and returns to supine. Pt requires max-total A with bed mobility, difficulty to maintain arousal, easily distracted requiring VC for attention to task. Repositioned pt back to supine, total A with use of bedpad- RN alerted regarding HR up to 143 with sitting EOB with max A and pt soiled with bowel movement. Pt lethargic, minimally engages with therapist, does respond to questions and cues appropriately; will trial acute PT with hopes of improvement in cognition to improve functional mobility. Pt may benefit from SNF if cognition and participation improves; will continue to assess DME needs. Pt currently with functional limitations due to the deficits listed below (see PT Problem List). Pt will benefit from skilled PT to increase their independence and safety with mobility to allow discharge to the venue listed below.          Recommendations for follow up therapy are one component of a multi-disciplinary discharge planning process, led by the attending physician.  Recommendations may be updated based on patient status, additional functional criteria and insurance  authorization.  Follow Up Recommendations Skilled nursing-short term rehab (<3 hours/day)    Assistance Recommended at Discharge Frequent or constant Supervision/Assistance  Functional Status Assessment    Equipment Recommendations  Other (comment) (TBD)    Recommendations for Other Services       Precautions / Restrictions Precautions Precautions: Fall Precaution Comments: monitor HR Restrictions Weight Bearing Restrictions: No      Mobility  Bed Mobility Overal bed mobility: Needs Assistance Bed Mobility: Supine to Sit;Sit to Supine  Supine to sit: Max assist Sit to supine: Total assist  General bed mobility comments: pt initially attempts to slide BLE over to EOB then reaches arms out and holds onto therapist's hands to pull trunk into upright sitting, easily distracted and returns self to lying, max A to sit EOB; poor sitting balance with LOB in all directions, opens eyes for 2-3 sec intervals then returns to closed, total assist to return to supine and scoot up in bed using bedpad    Transfers  General transfer comment: unsafe to attempt    Ambulation/Gait    Stairs            Wheelchair Mobility    Modified Rankin (Stroke Patients Only)       Balance Overall balance assessment: Needs assistance   Sitting balance-Leahy Scale: Zero Sitting balance - Comments: mod-max A to sit EOB       Pertinent Vitals/Pain Pain Assessment: No/denies pain    Home Living Family/patient expects to be discharged to:: Unsure     Prior Function Prior Level of Function : Patient poor historian/Family not available      Hand Dominance        Extremity/Trunk Assessment  Upper Extremity Assessment Upper Extremity Assessment: Difficult to assess due to impaired cognition    Lower Extremity Assessment Lower Extremity Assessment: Difficult to assess due to impaired cognition;RLE deficits/detail;LLE deficits/detail RLE Deficits / Details: pt able to wiggle toes  and pump ankles, limited by cognition LLE Deficits / Details: pt able to wiggle toes and pump ankles, limited by cognition       Communication      Cognition Arousal/Alertness: Lethargic Behavior During Therapy: Flat affect Overall Cognitive Status: No family/caregiver present to determine baseline cognitive functioning  General Comments: pt states name, DOB, age, location, and year appropriately, though doesn't open eyes when speaking; states month is November and unable to state why in hospital. pt very easily distracted requiring max cues for attention to task        General Comments General comments (skin integrity, edema, etc.): HR up to 143 with sitting EOB, back down to 120s once supine- LPN notified    Exercises     Assessment/Plan    PT Assessment Patient needs continued PT services  PT Problem List Decreased strength;Decreased activity tolerance;Decreased balance;Decreased mobility;Decreased cognition;Decreased knowledge of use of DME;Decreased safety awareness;Cardiopulmonary status limiting activity       PT Treatment Interventions DME instruction;Gait training;Functional mobility training;Therapeutic activities;Therapeutic exercise;Balance training;Patient/family education    PT Goals (Current goals can be found in the Care Plan section)  Acute Rehab PT Goals Patient Stated Goal: agreeable to sit EOB PT Goal Formulation: With patient Time For Goal Achievement: 11/19/21 Potential to Achieve Goals: Fair    Frequency Min 2X/week   Barriers to discharge        Co-evaluation               AM-PAC PT "6 Clicks" Mobility  Outcome Measure Help needed turning from your back to your side while in a flat bed without using bedrails?: Total Help needed moving from lying on your back to sitting on the side of a flat bed without using bedrails?: Total Help needed moving to and from a bed to a chair (including a wheelchair)?: Total Help needed standing up from a  chair using your arms (e.g., wheelchair or bedside chair)?: Total Help needed to walk in hospital room?: Total Help needed climbing 3-5 steps with a railing? : Total 6 Click Score: 6    End of Session Equipment Utilized During Treatment: Oxygen Activity Tolerance: Patient limited by lethargy Patient left: in bed;with call bell/phone within reach;with bed alarm set Nurse Communication: Mobility status;Other (comment) (HR, bowel movement) PT Visit Diagnosis: Other abnormalities of gait and mobility (R26.89);Muscle weakness (generalized) (M62.81)    Time: 9150-5697 PT Time Calculation (min) (ACUTE ONLY): 14 min   Charges:   PT Evaluation $PT Eval Moderate Complexity: 1 Mod           Tori Iantha Titsworth PT, DPT 11/05/21, 3:39 PM

## 2021-11-05 NOTE — Plan of Care (Signed)
  Problem: Acute Rehab PT Goals(only PT should resolve) Goal: Pt Will Go Supine/Side To Sit Outcome: Progressing Flowsheets (Taken 11/05/2021 1543) Pt will go Supine/Side to Sit: with moderate assist Goal: Pt Will Go Sit To Supine/Side Outcome: Progressing Flowsheets (Taken 11/05/2021 1543) Pt will go Sit to Supine/Side: with moderate assist Goal: Pt Will Transfer Bed To Chair/Chair To Bed Outcome: Progressing Flowsheets (Taken 11/05/2021 1543) Pt will Transfer Bed to Chair/Chair to Bed: with mod assist Goal: Pt Will Ambulate Outcome: Progressing Flowsheets (Taken 11/05/2021 1543) Pt will Ambulate:  15 feet  with moderate assist  with rolling walker Goal: Pt/caregiver will Perform Home Exercise Program Outcome: Progressing Flowsheets (Taken 11/05/2021 1543) Pt/caregiver will Perform Home Exercise Program:  For increased ROM  For increased strengthening  For improved balance  With Supervision, verbal cues required/provided   Talbot Grumbling PT, DPT 11/05/21, 3:43 PM

## 2021-11-05 NOTE — Progress Notes (Signed)
   11/05/21 0500  Assess: MEWS Score  Temp 98.3 F (36.8 C)  BP (!) 159/95  Pulse Rate (!) 118  Resp 16  Assess: MEWS Score  MEWS Temp 0  MEWS Systolic 0  MEWS Pulse 2  MEWS RR 0  MEWS LOC 0  MEWS Score 2  MEWS Score Color Yellow  Assess: if the MEWS score is Yellow or Red  Were vital signs taken at a resting state? Yes  Focused Assessment Change from prior assessment (see assessment flowsheet)  Early Detection of Sepsis Score *See Row Information* Low  MEWS guidelines implemented *See Row Information* Yes  Treat  MEWS Interventions Administered scheduled meds/treatments  Pain Scale 0-10  Pain Score 0  Take Vital Signs  Increase Vital Sign Frequency  Yellow: Q 2hr X 2 then Q 4hr X 2, if remains yellow, continue Q 4hrs  Escalate  MEWS: Escalate Yellow: discuss with charge nurse/RN and consider discussing with provider and RRT  Notify: Charge Nurse/RN  Name of Charge Nurse/RN Notified Reather Converse, RN  Date Charge Nurse/RN Notified 11/05/21  Time Charge Nurse/RN Notified 0510  Document  Patient Outcome Stabilized after interventions (administered scheduled metoprolol)  Progress note created (see row info) Yes

## 2021-11-05 NOTE — Progress Notes (Signed)
PT Cancellation Note  Patient Details Name: CHETARA KROPP MRN: [id number] DOB: 04/27/1941   Cancelled Treatment:    Reason Eval/Treat Not Completed: Other (comment) (fever, linen change). NT in room states pt has a fever, she just alerted RN. NT assisting pt with rolling to change soiled linen and requests therapy check back at later time.    Tori Kainoah Bartosiewicz PT, DPT 11/05/21, 2:02 PM

## 2021-11-05 NOTE — Progress Notes (Signed)
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  Apparently, the patient was found on the floor by her caretakers who came by to check up on her.  The patient was awake but confused at the time.  The circumstances around her fall are unclear.  The patient is unable to provide any history regarding the circumstances.  However, she has had numerous similar presentations in the past.  There have been no reports of vomiting, diarrhea, chest pain, shortness of breath.  At baseline, the patient lives by herself, but she has caretakers for about 9 hours during the day.  Unfortunately, the patient continues to drink the equivalent of 6 glasses of wine on a daily basis. Notably, the patient was admitted to the hospital from 05/18/2021 to 05/24/2021 with a similar presentation when she was found on the floor at that time.  She was treated for acute kidney injury, rhabdomyolysis.  She had elevated troponins at that time.  Echocardiogram on 05/09/2021 showed EF 35%.  She did develop fluid overload from fluid resuscitation.  Subsequently, she was discharged to [facility] for rehab.  She quit drinking alcohol for about 8 weeks, but after she returned home, she began drinking wine again. In the emergency department, the patient was afebrile hemodynamically stable albeit with soft blood pressures.  BMP showed a sodium 131, potassium 6.4, serum creatinine 1.97, WBC 12.0, hemoglobin 10.7, platelets 164,000.  She had a scalp laceration which was stapled.  CT of the brain and cervical spine were negative for any other acute findings.  CT of the chest and abdomen did not show any  traumatic injuries.   Assessment/Plan: Acute kidney injury -Secondary to volume depletion -Baseline creatinine 0.7-0.9 -Presented with serum creatinine 1.97 -Continue IV fluids>>improved   Hyperkalemia -Patient was treated with temporizing and definitive measures with improvement -Continue IV fluids>>improved  Hypoxia -RN noted hypoxia in 70s in am 12/6 -??artifact as pt is more alert without sob -order CTA chest with elevated D-dimer -7.344/27/104/16 on 4L -wean to RA  Wernicke Korsakoff encephalopathy -Unclear if the patient has superimposed Alzheimer's dementia -Continue high-dose thiamine -12/5-appears less agitated  NAG metabolic acidosis -likely due to Etoh vs RTA -start po bicarb   Alcohol abuse -Alcohol withdrawal protocol   Hypertension -Continue low-dose metoprolol IV -Hold losartan -Hold spironolactone   Thrombocytopenia -Has been intermittent and chronic -Due to chronic alcohol use/alcoholic liver disease   Scalp laceration -Staples placed in the ED 11/29/2021 -CT brain negative for acute findings -Remove staples in 2 weeks         Status is: Inpatient   Remains inpatient appropriate because: severity of illness, confusion               Family Communication:   daughter updated 12/6   Consultants:  none   Code Status:  FULL   DVT Prophylaxis:  [place] Lovenox     Procedures: As Listed in Progress Note Above   Antibiotics: None   Subjective: Patient denies fevers, chills, headache, chest pain, dyspnea, nausea, vomiting, diarrhea, abdominal pain, dysuria, hematuria, hematochezia, and melena.   Objective: Vitals:   11/05/21 0644 11/05/21 0648 11/05/21 0806 11/05/21 0900  BP: (!) 156/92 Marland Kitchen)  156/92  (!) 146/86  Pulse: 70 (!) 104  68  Resp: 16 16  18   Temp: 99 F (37.2 C) 99 F (37.2 C)  98.9 F (37.2 C)  TempSrc:    Axillary  SpO2: (!) 77% 90% 97% 95%  Weight:      Height:        Intake/Output Summary (Last 24 hours) at  11/05/2021 1149 Last data filed at 11/05/2021 0900 Gross per 24 hour  Intake 678.25 ml  Output --  Net 678.25 ml   Weight change:  Exam:  General:  Pt is alert, follows commands appropriately, not in acute distress HEENT: No icterus, No thrush, No neck mass, [place]/AT Cardiovascular: RRR, S1/S2, no rubs, no gallops Respiratory: fine bibasilar rales. No wheeze Abdomen: Soft/+BS, non tender, non distended, no guarding Extremities: No edema, No lymphangitis, No petechiae, No rashes, no synovitis   Data Reviewed: I have personally reviewed following labs and imaging studies Basic Metabolic Panel: Recent Labs  Lab 11/02/21 1206 11/02/21 1942 11/02/21 2343 11/03/21 0432 11/03/21 0700 11/04/21 0355 11/05/21 0456  NA 131* 131*  --   --  135 136 140  K 6.4* 4.3 4.5 4.6 3.8 3.6 4.0  CL 102 103  --   --  109 114* 116*  CO2 17* 13*  --   --  18* 18* 14*  GLUCOSE 89 131*  --   --  82 67* 52*  BUN 46* 40*  --   --  33* 21 14  CREATININE 1.97* 1.85*  --   --  1.35* 0.93 1.17*  CALCIUM 8.9 7.9*  --   --  7.7* 7.6* 8.0*  MG  --   --   --   --  2.1 2.0 1.8   Liver Function Tests: Recent Labs  Lab 11/03/21 0700 11/05/21 0456  AST 31 29  ALT 18 16  ALKPHOS 70 65  BILITOT 0.4 0.5  PROT 5.4* 5.2*  ALBUMIN 3.4* 3.0*   No results for input(s): LIPASE, AMYLASE in the last 168 hours. Recent Labs  Lab 11/05/21 0746  AMMONIA 13   Coagulation Profile: No results for input(s): INR, PROTIME in the last 168 hours. CBC: Recent Labs  Lab 11/02/21 1206 11/03/21 0700 11/04/21 0355 11/05/21 0746  WBC 12.0* 5.5 5.9 11.6*  HGB 10.7* 8.4* 8.2* 8.1*  HCT 31.8* 25.4* 26.4* 25.7*  MCV 99.4 101.2* 103.9* 105.8*  PLT 164 108* 82* 81*   Cardiac Enzymes: Recent Labs  Lab 11/03/21 0700  CKTOTAL 181   BNP: Invalid input(s): POCBNP CBG: No results for input(s): GLUCAP in the last 168 hours. HbA1C: No results for input(s): HGBA1C in the last 72 hours. Urine analysis:    Component Value  Date/Time   COLORURINE AMBER (A) 05/18/2021 1236   APPEARANCEUR TURBID (A) 05/18/2021 1236   LABSPEC 1.029 05/18/2021 1236   PHURINE 5.0 05/18/2021 1236   GLUCOSEU NEGATIVE 05/18/2021 1236   HGBUR LARGE (A) 05/18/2021 1236   BILIRUBINUR NEGATIVE 05/18/2021 1236   KETONESUR 5 (A) 05/18/2021 1236   PROTEINUR 30 (A) 05/18/2021 1236   UROBILINOGEN 0.2 09/08/2014 0300   NITRITE NEGATIVE 05/18/2021 1236   LEUKOCYTESUR MODERATE (A) 05/18/2021 1236   Sepsis Labs: @LABRCNTIP (procalcitonin:4,lacticidven:4) ) Recent Results (from the past 240 hour(s))  Resp Panel by RT-PCR (Flu A&B, Covid) Nasopharyngeal Swab     Status: None   Collection Time: 11/02/21  2:57 PM   Specimen: Nasopharyngeal Swab; Nasopharyngeal(NP) swabs in vial transport medium  Result Value Ref Range Status  SARS Coronavirus 2 by RT PCR NEGATIVE NEGATIVE Final    Comment: (NOTE) SARS-CoV-2 target nucleic acids are NOT DETECTED.  The SARS-CoV-2 RNA is generally detectable in upper respiratory specimens during the acute phase of infection. The lowest concentration of SARS-CoV-2 viral copies this assay can detect is 138 copies/mL. A negative result does not preclude SARS-Cov-2 infection and should not be used as the sole basis for treatment or other patient management decisions. A negative result may occur with  improper specimen collection/handling, submission of specimen other than nasopharyngeal swab, presence of viral mutation(s) within the areas targeted by this assay, and inadequate number of viral copies(<138 copies/mL). A negative result must be combined with clinical observations, patient history, and epidemiological information. The expected result is Negative.  Fact Sheet for Patients:  EntrepreneurPulse.com.au  Fact Sheet for Healthcare Providers:  IncredibleEmployment.be  This test is no t yet approved or cleared by the [place] FDA and  has been authorized for  detection and/or diagnosis of SARS-CoV-2 by FDA under an Emergency Use Authorization (EUA). This EUA will remain  in effect (meaning this test can be used) for the duration of the COVID-19 declaration under Section 564(b)(1) of the Act, 21 U.S.C.section 360bbb-3(b)(1), unless the authorization is terminated  or revoked sooner.       Influenza A by PCR NEGATIVE NEGATIVE Final   Influenza B by PCR NEGATIVE NEGATIVE Final    Comment: (NOTE) The Xpert Xpress SARS-CoV-2/FLU/RSV plus assay is intended as an aid in the diagnosis of influenza from Nasopharyngeal swab specimens and should not be used as a sole basis for treatment. Nasal washings and aspirates are unacceptable for Xpert Xpress SARS-CoV-2/FLU/RSV testing.  Fact Sheet for Patients: EntrepreneurPulse.com.au  Fact Sheet for Healthcare Providers: IncredibleEmployment.be  This test is not yet approved or cleared by the [place] FDA and has been authorized for detection and/or diagnosis of SARS-CoV-2 by FDA under an Emergency Use Authorization (EUA). This EUA will remain in effect (meaning this test can be used) for the duration of the COVID-19 declaration under Section 564(b)(1) of the Act, 21 U.S.C. section 360bbb-3(b)(1), unless the authorization is terminated or revoked.  Performed at [facility], [address], [place], [place] [address]      Scheduled Meds:  enoxaparin (LOVENOX) injection  30 mg Subcutaneous N23F   folic acid  1 mg Oral Daily   LORazepam  0-4 mg Oral Q12H   metoprolol tartrate  5 mg Intravenous Q6H   multivitamin with minerals  1 tablet Oral Daily   Continuous Infusions:  0.9 % NaCl with KCl 20 mEq / L 75 mL/hr at 11/04/21 1850    Procedures/Studies: CT Head Wo Contrast  Result Date: 11/02/2021 CLINICAL DATA:  Multiple falls EXAM: CT HEAD WITHOUT CONTRAST CT CERVICAL SPINE WITHOUT CONTRAST TECHNIQUE: Multidetector CT imaging of the head and cervical  spine was performed following the standard protocol without intravenous contrast. Multiplanar CT image reconstructions of the cervical spine were also generated. COMPARISON:  05/18/2021 FINDINGS: CT HEAD FINDINGS Brain: No evidence of acute infarction, hemorrhage, hydrocephalus, extra-axial collection or mass lesion/mass effect. Mild periventricular and deep white matter hypodensity. Mild global cerebral volume loss. Vascular: No hyperdense vessel or unexpected calcification. Skull: Normal. Negative for fracture or focal lesion. Sinuses/Orbits: No acute finding. Other: None. CT CERVICAL SPINE FINDINGS Alignment: Normal. Skull base and vertebrae: No acute fracture. No primary bone lesion or focal pathologic process. Soft tissues and spinal canal: No prevertebral fluid or swelling. No visible canal hematoma. Disc levels:  Moderate multilevel disc space height loss and osteophytosis. Upper chest: Wedge resection of the right pulmonary apex. Other: None. IMPRESSION: 1. No acute intracranial pathology. Small-vessel white matter disease and global cerebral volume loss. 2. No fracture or static subluxation of the cervical spine. 3. Moderate multilevel cervical disc degenerative disease. Electronically Signed   By: Delanna Ahmadi M.D.   On: 11/02/2021 12:26   CT Cervical Spine Wo Contrast  Result Date: 11/02/2021 CLINICAL DATA:  Multiple falls EXAM: CT HEAD WITHOUT CONTRAST CT CERVICAL SPINE WITHOUT CONTRAST TECHNIQUE: Multidetector CT imaging of the head and cervical spine was performed following the standard protocol without intravenous contrast. Multiplanar CT image reconstructions of the cervical spine were also generated. COMPARISON:  05/18/2021 FINDINGS: CT HEAD FINDINGS Brain: No evidence of acute infarction, hemorrhage, hydrocephalus, extra-axial collection or mass lesion/mass effect. Mild periventricular and deep white matter hypodensity. Mild global cerebral volume loss. Vascular: No hyperdense vessel or  unexpected calcification. Skull: Normal. Negative for fracture or focal lesion. Sinuses/Orbits: No acute finding. Other: None. CT CERVICAL SPINE FINDINGS Alignment: Normal. Skull base and vertebrae: No acute fracture. No primary bone lesion or focal pathologic process. Soft tissues and spinal canal: No prevertebral fluid or swelling. No visible canal hematoma. Disc levels: Moderate multilevel disc space height loss and osteophytosis. Upper chest: Wedge resection of the right pulmonary apex. Other: None. IMPRESSION: 1. No acute intracranial pathology. Small-vessel white matter disease and global cerebral volume loss. 2. No fracture or static subluxation of the cervical spine. 3. Moderate multilevel cervical disc degenerative disease. Electronically Signed   By: Delanna Ahmadi M.D.   On: 11/02/2021 12:26   DG CHEST PORT 1 VIEW  Result Date: 11/05/2021 CLINICAL DATA:  Hypoxia with weakness. EXAM: PORTABLE CHEST 1 VIEW COMPARISON:  May 21, 2021. FINDINGS: The heart size and mediastinal contours are within normal limits. Postsurgical changes are noted in right upper lobe. Significantly decreased bibasilar opacities are noted suggesting improving atelectasis or infiltrates. Left rib fractures are again noted. IMPRESSION: Significantly improved bibasilar opacities are noted. Electronically Signed   By: Marijo Conception M.D.   On: 11/05/2021 10:42   CT CHEST ABDOMEN PELVIS WO CONTRAST  Result Date: 11/02/2021 CLINICAL DATA:  Fall, trauma EXAM: CT CHEST, ABDOMEN AND PELVIS WITHOUT CONTRAST TECHNIQUE: Multidetector CT imaging of the chest, abdomen and pelvis was performed following the standard protocol without IV contrast. COMPARISON:  05/18/2021 FINDINGS: CT CHEST FINDINGS Cardiovascular: Aortic atherosclerosis. Normal heart size. Left coronary artery calcifications no pericardial effusion. Mediastinum/Nodes: No enlarged mediastinal, hilar, or axillary lymph nodes. Thyroid gland, trachea, and esophagus demonstrate no  significant findings. Lungs/Pleura: Wedge resection of the right upper lobe. No pleural effusion or pneumothorax. Musculoskeletal: No chest wall mass or suspicious bone lesions identified. Multiple chronic, callused fractures of the bilateral ribs. CT ABDOMEN PELVIS FINDINGS Hepatobiliary: No solid liver abnormality is seen. No gallstones, gallbladder wall thickening, or biliary dilatation. Pancreas: Unremarkable. No pancreatic ductal dilatation or surrounding inflammatory changes. Spleen: Normal in size without significant abnormality. Adrenals/Urinary Tract: Adrenal glands are unremarkable. Multiple small bilateral nonobstructive renal calculi. No ureteral calculi or hydronephrosis. Bladder is unremarkable. Stomach/Bowel: Stomach is within normal limits. Status post partial right hemicolectomy and ileocolic reanastomosis. No evidence of bowel wall thickening, distention, or inflammatory changes. Vascular/Lymphatic: No significant vascular findings are present. No enlarged abdominal or pelvic lymph nodes. Reproductive: Status post hysterectomy. Other: Small midline ventral abdominal hernia containing a single nonobstructed loop of transverse colon (series 2, image 75). No abdominopelvic ascites. Musculoskeletal: No acute or  significant osseous findings. IMPRESSION: 1. No noncontrast evidence of acute traumatic injury to the chest, abdomen, or pelvis. 2. Multiple chronic, callused fractures of the bilateral ribs. 3. Bilateral nonobstructive nephrolithiasis. 4. Wedge resection of the right upper lobe. 5. Partial right hemicolectomy and ileocolic anastomosis. 6. Small midline ventral abdominal hernia containing a single nonobstructed loop of transverse colon. 7. Coronary artery disease. Aortic Atherosclerosis (ICD10-I70.0). Electronically Signed   By: Delanna Ahmadi M.D.   On: 11/02/2021 14:06    Orson Eva, DO  [facility]  If 7PM-7AM, please contact night-coverage [email or web address] Password [email or web address] 11/05/2021,  11:49 AM   LOS: 3 days

## 2021-11-05 NOTE — Progress Notes (Signed)
Responded to nursing call:  hypoxia in to 77% She was placed on 5L with saturation now 100%  Subjective: I arrived to assess patient.  She was comfortable without any distress.  She answered questions appropriately.  Patient denies fevers, chills, headache, chest pain, dyspnea, nausea, vomiting, abd pain   Vitals:   11/05/21 0002 11/05/21 0500 11/05/21 0644 11/05/21 0648  BP: (!) 147/82 (!) 159/95 (!) 156/92 (!) 156/92  Pulse: (!) 109 (!) 118 70 (!) 104  Resp: 19 16 16 16   Temp: 99.5 F (37.5 C) 98.3 F (36.8 C) 99 F (37.2 C) 99 F (37.2 C)  TempSrc: Oral Oral    SpO2: 99%  (!) 77% 90%  Weight:      Height:       CV--RRR Lung--bibasilar crackles Abd--soft+BS/NT   Assessment/Plan: Hypoxia--?artifact -very poor waveforms on pulseox -obtain ABG -CXR -D-dimer  Encephalopathy -today is the most awake and alert and appropriate that the patient has been during the entire hospitalization     Orson Eva, DO [facility]

## 2021-11-05 NOTE — Progress Notes (Signed)
Wanted to get up to bathroom earlier but was unable to do so on her own and refused any help.  Assisted with putting on shoes ans still could not stand from bed with walker.  Vital check was 77% on room air and 4 liters brought up to 90% and fingers cyanotic .  Had 3 mg of ativan at 0340.  Messaged Dr. Carles Collet about oxygen and he wanted a new set of vital and ordrered ABGs Ddimer and CBC

## 2021-11-05 NOTE — Progress Notes (Signed)
**Note De-Identified  Obfuscation** ABG collected and dropped off at the lab

## 2021-11-06 DIAGNOSIS — R7989 Other specified abnormal findings of blood chemistry: Secondary | ICD-10-CM

## 2021-11-06 DIAGNOSIS — E876 Hypokalemia: Secondary | ICD-10-CM

## 2021-11-06 DIAGNOSIS — J9601 Acute respiratory failure with hypoxia: Secondary | ICD-10-CM

## 2021-11-06 LAB — BASIC METABOLIC PANEL
Anion gap: 13 (ref 5–15)
BUN: 13 mg/dL (ref 8–23)
CO2: 15 mmol/L — ABNORMAL LOW (ref 22–32)
Calcium: 7.9 mg/dL — ABNORMAL LOW (ref 8.9–10.3)
Chloride: 112 mmol/L — ABNORMAL HIGH (ref 98–111)
Creatinine, Ser: 1.11 mg/dL — ABNORMAL HIGH (ref 0.44–1.00)
GFR, Estimated: 50 mL/min — ABNORMAL LOW (ref >60)
Glucose, Bld: 66 mg/dL — ABNORMAL LOW (ref 70–99)
Potassium: 2.8 mmol/L — ABNORMAL LOW (ref 3.5–5.1)
Sodium: 140 mmol/L (ref 135–145)

## 2021-11-06 LAB — MAGNESIUM: Magnesium: 1.8 mg/dL (ref 1.7–2.4)

## 2021-11-06 LAB — T4, FREE: Free T4: 0.77 ng/dL (ref 0.61–1.12)

## 2021-11-06 LAB — POTASSIUM: Potassium: 3 mmol/L — ABNORMAL LOW (ref 3.5–5.1)

## 2021-11-06 MED ORDER — POTASSIUM CHLORIDE CRYS ER 20 MEQ PO TBCR
40.0000 meq | EXTENDED_RELEASE_TABLET | Freq: Once | ORAL | Status: AC
  Administered 2021-11-06: 40 meq via ORAL
  Filled 2021-11-06: qty 2

## 2021-11-06 MED ORDER — THIAMINE HCL 100 MG PO TABS
100.0000 mg | ORAL_TABLET | Freq: Every day | ORAL | Status: DC
  Administered 2021-11-06 – 2021-11-07 (×2): 100 mg via ORAL
  Filled 2021-11-06 (×2): qty 1

## 2021-11-06 MED ORDER — AMLODIPINE BESYLATE 5 MG PO TABS
5.0000 mg | ORAL_TABLET | Freq: Every day | ORAL | Status: DC
  Administered 2021-11-07: 5 mg via ORAL
  Filled 2021-11-06: qty 1

## 2021-11-06 MED ORDER — POTASSIUM CHLORIDE CRYS ER 20 MEQ PO TBCR: 40.0000 meq | EXTENDED_RELEASE_TABLET | ORAL | Status: DC

## 2021-11-06 NOTE — TOC Initial Note (Addendum)
Transition of Care [facility]) - Initial/Assessment Note    Patient Details  Name: [name] MRN: [id number] Date of Birth: March 29, 1941  [facility] CM/SW Contact:    Boneta Lucks, RN Phone Number: 11/06/2021, 12:44 PM  Clinical Narrative:       Patient admitted with Hyperkalemia. Patient lives alone, with caregiver coming in and out check on her. TOC spoke with daughter yesterday and today. She is agreeable to SNF and wants her Mother option. TOC went to the bedside. Patient thinks she is at [facility] now. She is sleepy. She spoke with her daughter on the phone and is agreeable to SNF for now. They will also work on getting more help to come into the home. PASSR # is pending.  FL2 sent out.           Addendum: Uploaded document requested to [place] [facility]  Expected Discharge Plan: [place] Barriers to Discharge: Continued Medical Work up  Patient Goals and CMS Choice Patient states their goals for this hospitalization and ongoing recovery are:: agreeable to SNF CMS Medicare.gov Compare Post Acute Care list provided to:: Patient Choice offered to / list presented to : Patient  Expected Discharge Plan and Services Expected Discharge Plan: [place] arrangements for the past 2 months: Single Family Home                 Prior Living Arrangements/Services Living arrangements for the past 2 months: Single Family Home Lives with:: Self            Current home services: Homehealth aide    Activities of Daily Living Home Assistive Devices/Equipment: Wheelchair ADL Screening (condition at time of admission) Patient's cognitive ability adequate to safely complete daily activities?: Yes Is the patient deaf or have difficulty hearing?: No Does the patient have difficulty seeing, even when wearing glasses/contacts?: No Does the patient have difficulty concentrating, remembering, or making decisions?: No Patient able to express need  for assistance with ADLs?: Yes Does the patient have difficulty dressing or bathing?: No Independently performs ADLs?: Yes (appropriate for developmental age) Does the patient have difficulty walking or climbing stairs?: No Weakness of Legs: Both (at times) Weakness of Arms/Hands: None  Permission Sought/Granted     Emotional Assessment    Affect (typically observed): Accepting, Flat Orientation: : Oriented to Self Alcohol / Substance Use: Alcohol Use    Admission diagnosis:  Dehydration [E86.0] Hyperkalemia [E87.5] Fall [W19.XXXA] AKI (acute kidney injury) ([place]) [N17.9] Fall, initial encounter [W19.XXXA] Laceration of scalp, initial encounter [S01.01XA] Patient Active Problem List   Diagnosis Date Noted   Hypoxia    Wernicke's encephalopathy 11/03/2021   Scalp laceration    Hyperkalemia 11/02/2021   Fall at home, initial encounter 11/02/2021   Regional wall motion abnormality of heart 05/22/2021   Malnutrition of moderate degree 05/22/2021   Cardiomyopathy, alcoholic (HCC)    Acute on chronic systolic heart failure (HCC)    Protein-calorie malnutrition, severe ([place]) 05/21/2021   Cognitive impairment    Mitral valve regurgitation, moderate 05/20/2021   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[place])    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 05/19/2021   Cerebral atrophy ([place]) 05/19/2021   Generalized weakness 05/19/2021   Rhabdomyolysis 85/27/7824   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[place]) 05/19/2021   Atherosclerosis of aorta ([place]) 05/19/2021   Multiple skin tears 05/19/2021   Multiple Bruising sites 05/19/2021   Poor historian 05/19/2021   Acute renal injury ([place]) 05/19/2021   Transaminitis  05/19/2021   Bilirubinemia 05/19/2021   Hypoproteinemia ([place]) 05/19/2021   Elevated troponin 05/19/2021   Vitamin B12 deficiency 05/19/2021   Macrocytic anemia 05/19/2021   Thrombocytopenia ([place]) 05/19/2021   TSH elevation 05/19/2021   Pyuria 05/19/2021    Hematuria 05/19/2021   Dehydration 05/19/2021   Cerebral atrophy ([place]) 05/19/2021   Degenerative disc disease, cervical 05/19/2021   Bilateral Periorbital ecchymosis 05/19/2021   Myocardial injury    Elevated CK    Fall 05/18/2021   Multiple closed fractures of ribs of left side 05/18/2021   Alteration consciousness 12/10/2020   Gait abnormality 12/10/2020   Sepsis ([place]) 02/29/2020   Acute lower UTI 02/29/2020   Unsteady gait 01/10/2020   Seizures ([place]) 01/10/2020   Osteoarthritis of left knee    AKI (acute kidney injury) ([place]) 12/19/2019   Physical deconditioning 12/19/2019   Alcohol dependence ([place]) 12/19/2019   Thrombocytopenia ([place]) 12/19/2019   Subarachnoid hemorrhage ([place]) 12/19/2019   SAH (subarachnoid hemorrhage) ([place])    Transaminasemia    Subarachnoid bleed ([place]) 12/18/2019   Hypokalemia 12/18/2019   Depression    Hyperbilirubinemia    Hyponatremia    Macrocytic anemia    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[place])    Arthritis 05/10/2019   Diarrhea 10/13/2017   Recurrent kidney stones 10/31/2014   Left ureteral stone 09/08/2014   Right ureteral stone 09/08/2014   Infection of urinary tract 09/08/2014   Pain in joint, shoulder region 08/09/2012   Muscle weakness (generalized) 08/09/2012   Impingement syndrome of left shoulder 08/09/2012   PCP:  Asencion Noble, MD Pharmacy:   [facility] [place] [facility] [address] [place] [address] Phone: [phone] Fax: [phone]  Readmission Risk Interventions Readmission Risk Prevention Plan 11/06/2021  Transportation Screening Complete  Home Care Screening Complete  Medication Review (RN CM) Complete  Some recent data might be hidden

## 2021-11-06 NOTE — Plan of Care (Signed)
  Problem: Acute Rehab OT Goals (only OT should resolve) Goal: Pt. Will Perform Grooming Flowsheets (Taken 11/06/2021 1120) Pt Will Perform Grooming:  standing  with min guard assist  with adaptive equipment Goal: Pt. Will Perform Lower Body Bathing Flowsheets (Taken 11/06/2021 1120) Pt Will Perform Lower Body Bathing:  with supervision  with min guard assist  sitting/lateral leans  sit to/from stand Goal: Pt. Will Perform Lower Body Dressing Flowsheets (Taken 11/06/2021 1120) Pt Will Perform Lower Body Dressing:  with supervision  sitting/lateral leans  with adaptive equipment Goal: Pt. Will Transfer To Toilet Flowsheets (Taken 11/06/2021 1120) Pt Will Transfer to Toilet:  with min guard assist  with supervision  stand pivot transfer  bedside commode Goal: Pt/Caregiver Will Perform Home Exercise Program Flowsheets (Taken 11/06/2021 1120) Pt/caregiver will Perform Home Exercise Program:  Increased strength  Both right and left upper extremity  With Supervision  Maynard David OT, MOT

## 2021-11-06 NOTE — Hospital Course (Signed)
[name] is a 80 y.o. 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 Patient presented secondary to confusion and s/p fall with subsequent head laceration. While admitted, she was found to have an AKI which improved with IV fluids.

## 2021-11-06 NOTE — NC FL2 (Signed)
[place] LEVEL OF CARE SCREENING TOOL     IDENTIFICATION  Patient Name: [name] Birthdate: 1941-11-29 Sex: female Admission Date (Current Location): 11/02/2021  [facility] and [place] Number:  [employer] and Address:  [place] [address], [place]      Provider Number: [id number]  Attending Physician Name and Address:  Mariel Aloe, MD  Relative Name and Phone Number:  Marlyn Corporal - Daughter [phone]    Current Level of Care: Hospital Recommended Level of Care: [place] Prior Approval Number:    Date Approved/Denied:   PASRR Number: Pending  Discharge Plan:      Current Diagnoses: Patient Active Problem List   Diagnosis Date Noted   Hypoxia    Wernicke's encephalopathy 11/03/2021   Scalp laceration    Hyperkalemia 11/02/2021   Fall at home, initial encounter 11/02/2021   Regional wall motion abnormality of heart 05/22/2021   Malnutrition of moderate degree 05/22/2021   Cardiomyopathy, alcoholic ([place])    Acute on chronic systolic heart failure ([place])    Protein-calorie malnutrition, severe ([place]) 05/21/2021   Cognitive impairment    Mitral valve regurgitation, moderate 05/20/2021   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[place])    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 05/19/2021   Cerebral atrophy ([place]) 05/19/2021   Generalized weakness 05/19/2021   Rhabdomyolysis 34/19/6222   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[place]) 05/19/2021   Atherosclerosis of aorta ([place]) 05/19/2021   Multiple skin tears 05/19/2021   Multiple Bruising sites 05/19/2021   Poor historian 05/19/2021   Acute renal injury ([place]) 05/19/2021   Transaminitis 05/19/2021   Bilirubinemia 05/19/2021   Hypoproteinemia ([place]) 05/19/2021   Elevated troponin 05/19/2021   Vitamin B12 deficiency 05/19/2021   Macrocytic anemia 05/19/2021   Thrombocytopenia ([place]) 05/19/2021   TSH elevation 05/19/2021    Pyuria 05/19/2021   Hematuria 05/19/2021   Dehydration 05/19/2021   Cerebral atrophy ([place]) 05/19/2021   Degenerative disc disease, cervical 05/19/2021   Bilateral Periorbital ecchymosis 05/19/2021   Myocardial injury    Elevated CK    Fall 05/18/2021   Multiple closed fractures of ribs of left side 05/18/2021   Alteration consciousness 12/10/2020   Gait abnormality 12/10/2020   Sepsis ([place]) 02/29/2020   Acute lower UTI 02/29/2020   Unsteady gait 01/10/2020   Seizures ([place]) 01/10/2020   Osteoarthritis of left knee    AKI (acute kidney injury) ([place]) 12/19/2019   Physical deconditioning 12/19/2019   Alcohol dependence ([place]) 12/19/2019   Thrombocytopenia ([place]) 12/19/2019   Subarachnoid hemorrhage ([place]) 12/19/2019   SAH (subarachnoid hemorrhage) ([place])    Transaminasemia    Subarachnoid bleed ([place]) 12/18/2019   Hypokalemia 12/18/2019   Depression    Hyperbilirubinemia    Hyponatremia    Macrocytic anemia    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[place])    Arthritis 05/10/2019   Diarrhea 10/13/2017   Recurrent kidney stones 10/31/2014   Left ureteral stone 09/08/2014   Right ureteral stone 09/08/2014   Infection of urinary tract 09/08/2014   Pain in joint, shoulder region 08/09/2012   Muscle weakness (generalized) 08/09/2012   Impingement syndrome of left shoulder 08/09/2012    Orientation RESPIRATION BLADDER Height & Weight     Self  Normal Continent Weight: 39.3 kg Height:  5' (152.4 cm)  BEHAVIORAL SYMPTOMS/MOOD NEUROLOGICAL BOWEL NUTRITION STATUS      Continent Diet (See DC summary)  AMBULATORY STATUS COMMUNICATION OF NEEDS Skin   Extensive Assist Verbally Normal  Personal Care Assistance Level of Assistance  Bathing, Feeding, Dressing Bathing Assistance: Maximum assistance Feeding assistance: Limited assistance Dressing Assistance: Maximum assistance     Functional Limitations Info  Sight, Hearing, Speech Sight Info: Adequate Hearing Info:  Adequate Speech Info: Adequate    SPECIAL CARE FACTORS FREQUENCY  PT (By licensed PT)     PT Frequency: 5 times a week              Contractures Contractures Info: Not present    Additional Factors Info  Code Status, Allergies Code Status Info: Full Allergies Info: Clindymycin, Doxycycline           Current Medications (11/06/2021):  This is the current hospital active medication list Current Facility-Administered Medications  Medication Dose Route Frequency Provider Last Rate Last Admin   enoxaparin (LOVENOX) injection 30 mg  30 mg Subcutaneous Q24H Hall, Scott A, RPH   30 mg at 40/08/67 6195   folic acid (FOLVITE) tablet 1 mg  1 mg Oral Daily Truett Mainland, DO   1 mg at 11/06/21 1058   LORazepam (ATIVAN) tablet 0-4 mg  0-4 mg Oral Q12H Truett Mainland, DO   1 mg at 11/05/21 0932   metoprolol tartrate (LOPRESSOR) injection 5 mg  5 mg Intravenous Nicholes Calamity, MD   5 mg at 11/06/21 6712   multivitamin with minerals tablet 1 tablet  1 tablet Oral Daily Truett Mainland, DO   1 tablet at 11/06/21 1058   ondansetron (ZOFRAN) tablet 4 mg  4 mg Oral Q6H PRN Truett Mainland, DO       Or   ondansetron [facility]) injection 4 mg  4 mg Intravenous Q6H PRN Truett Mainland, DO       sodium bicarbonate tablet 650 mg  650 mg Oral BID Orson Eva, MD   650 mg at 11/06/21 1058     Discharge Medications: Please see discharge summary for a list of discharge medications.  Relevant Imaging Results:  Relevant Lab Results:   Additional Information SS# [id number]  Boneta Lucks, RN

## 2021-11-06 NOTE — Assessment & Plan Note (Addendum)
TSH of 9.548. Normal free T4. Possibly subclinical hypothyroidism. May need treatment if TSH continues to climb. Repeat TSH in 4-6 weeks.

## 2021-11-06 NOTE — Assessment & Plan Note (Addendum)
Chronic issue. Continue potassium supplementation on discharge. Recheck BMP as needed.

## 2021-11-06 NOTE — Assessment & Plan Note (Signed)
Patient drinks several glasses of wine as an outpatient. Started on CIWA on admission.

## 2021-11-06 NOTE — Progress Notes (Signed)
PROGRESS NOTE    [name]  GQQ:[id number] DOB: 30-Nov-1941 DOA: 11/02/2021 PCP: Angela Noble, MD   Brief Narrative: [name] is a 80 y.o. 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 Patient presented secondary to confusion and s/p fall with subsequent head laceration. While admitted, she was found to have an AKI which improved with IV fluids.   Assessment & Plan:   * AKI (acute kidney injury) ([place]) Baseline creatinine of 0.7-0.9. Creatinine of 1.97 on admission. Improvement with IV fluids.  Acute respiratory failure with hypoxia (HCC) Hypoxia with SpO2 down to 70% per documentation; started on supplemental oxygen. CTA chest ordered with evidence of trace pleural effusions. -Wean to room air  Scalp laceration Secondary to fall. Two staples placed in the ED on 12/3. Recommendations to remove two weeks after placement (~12/17)  Wernicke's encephalopathy Possible underlying dementia. Patient started on high dose thiamine 500 mg IV. Confusion appears to have improved. -Thiamine 100 mg PO daily  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[place]) Patient drinks several glasses of wine as an outpatient. Started on CIWA on admission.  TSH elevation TSH of 9.548. Normal free T4. Possibly subclinical hypothyroidism. May need treatment if TSH continues to climb.  Thrombocytopenia (HCC) Chronic issue. Normal on admission with downtrend likely related to normalization after IV fluid resuscitation. Stable.  Hypokalemia -Potassium supplementaiton  Hyperkalemia-resolved as of 11/06/2021 Likely related to AKI. Resolved.    DVT prophylaxis: Lovenox Code Status:   Code Status: Full Code Family Communication: None at bedside. Called daughter [name]) with no response at  1550. Disposition Plan: Discharge to SNF pending bed availability   Consultants:  None  Procedures:  None  Antimicrobials: None    Subjective: Patient reports no issues or concerns this morning.  Objective: Vitals:   11/05/21 1349 11/05/21 2336 11/06/21 0322 11/06/21 0624  BP: (!) 150/83 (!) 150/81 (!) 136/58 137/83  Pulse:  80 73 (!) 101  Resp: 16 20    Temp: 100.2 F (37.9 C) 97.7 F (36.5 C)    TempSrc: Oral Oral    SpO2:  100% 100%   Weight:      Height:        Intake/Output Summary (Last 24 hours) at 11/06/2021 0958 Last data filed at 11/06/2021 0900 Gross per 24 hour  Intake 240 ml  Output 500 ml  Net -260 ml   Filed Weights   11/02/21 1742  Weight: 39.3 kg    Examination:  General exam: Appears calm and comfortable Respiratory system: Clear to auscultation. Respiratory effort normal. Cardiovascular system: S1 & S2 heard, RRR. No murmurs, rubs, gallops or clicks. Gastrointestinal system: Abdomen is nondistended, soft and nontender. No organomegaly or masses felt. Normal bowel sounds heard. Central nervous system: Alert and oriented to person and year. No focal neurological deficits. Musculoskeletal: No edema. No calf tenderness Skin: No cyanosis. No rashes Psychiatry: Mood & affect appropriate.     Data Reviewed: I have personally reviewed following labs and imaging studies  CBC Lab Results  Component Value Date   WBC 11.6 (H) 11/05/2021   RBC 2.43 (L) 11/05/2021   HGB 8.1 (L) 11/05/2021   HCT 25.7 (L) 11/05/2021   MCV 105.8 (H) 11/05/2021   MCH 33.3 11/05/2021   PLT 81 (L) 11/05/2021   MCHC 31.5 11/05/2021   RDW 13.0 11/05/2021   LYMPHSABS 1.0 05/20/2021  MONOABS 0.8 05/20/2021   EOSABS 0.0 05/20/2021   BASOSABS 0.0 62/22/9798     Last metabolic panel Lab Results  Component Value Date   NA 140 11/06/2021   K 3.0 (L) 11/06/2021   CL 112 (H) 11/06/2021   CO2 15 (L) 11/06/2021   BUN 13 11/06/2021   CREATININE 1.11 (H) 11/06/2021    GLUCOSE 66 (L) 11/06/2021   GFRNONAA 50 (L) 11/06/2021   GFRAA 46 (L) 09/03/2020   CALCIUM 7.9 (L) 11/06/2021   PHOS 4.7 (H) 12/18/2019   PROT 5.2 (L) 11/05/2021   ALBUMIN 3.0 (L) 11/05/2021   BILITOT 0.5 11/05/2021   ALKPHOS 65 11/05/2021   AST 29 11/05/2021   ALT 16 11/05/2021   ANIONGAP 13 11/06/2021    CBG (last 3)  No results for input(s): GLUCAP in the last 72 hours.   GFR: Estimated Creatinine Clearance: 25.1 mL/min (A) (by C-G formula based on SCr of 1.11 mg/dL (H)).  Coagulation Profile: No results for input(s): INR, PROTIME in the last 168 hours.  Recent Results (from the past 240 hour(s))  Resp Panel by RT-PCR (Flu A&B, Covid) Nasopharyngeal Swab     Status: None   Collection Time: 11/02/21  2:57 PM   Specimen: Nasopharyngeal Swab; Nasopharyngeal(NP) swabs in vial transport medium  Result Value Ref Range Status   SARS Coronavirus 2 by RT PCR NEGATIVE NEGATIVE Final    Comment: (NOTE) SARS-CoV-2 target nucleic acids are NOT DETECTED.  The SARS-CoV-2 RNA is generally detectable in upper respiratory specimens during the acute phase of infection. The lowest concentration of SARS-CoV-2 viral copies this assay can detect is 138 copies/mL. A negative result does not preclude SARS-Cov-2 infection and should not be used as the sole basis for treatment or other patient management decisions. A negative result may occur with  improper specimen collection/handling, submission of specimen other than nasopharyngeal swab, presence of viral mutation(s) within the areas targeted by this assay, and inadequate number of viral copies(<138 copies/mL). A negative result must be combined with clinical observations, patient history, and epidemiological information. The expected result is Negative.  Fact Sheet for Patients:  EntrepreneurPulse.com.au  Fact Sheet for Healthcare Providers:  IncredibleEmployment.be  This test is no t yet approved  or cleared by the [place] FDA and  has been authorized for detection and/or diagnosis of SARS-CoV-2 by FDA under an Emergency Use Authorization (EUA). This EUA will remain  in effect (meaning this test can be used) for the duration of the COVID-19 declaration under Section 564(b)(1) of the Act, 21 U.S.C.section 360bbb-3(b)(1), unless the authorization is terminated  or revoked sooner.       Influenza A by PCR NEGATIVE NEGATIVE Final   Influenza B by PCR NEGATIVE NEGATIVE Final    Comment: (NOTE) The Xpert Xpress SARS-CoV-2/FLU/RSV plus assay is intended as an aid in the diagnosis of influenza from Nasopharyngeal swab specimens and should not be used as a sole basis for treatment. Nasal washings and aspirates are unacceptable for Xpert Xpress SARS-CoV-2/FLU/RSV testing.  Fact Sheet for Patients: EntrepreneurPulse.com.au  Fact Sheet for Healthcare Providers: IncredibleEmployment.be  This test is not yet approved or cleared by the [place] FDA and has been authorized for detection and/or diagnosis of SARS-CoV-2 by FDA under an Emergency Use Authorization (EUA). This EUA will remain in effect (meaning this test can be used) for the duration of the COVID-19 declaration under Section 564(b)(1) of the Act, 21 U.S.C. section 360bbb-3(b)(1), unless the authorization is terminated or revoked.  Performed at  [facility], [address], [place], [place] [address]         Radiology Studies: CT Angio Chest Pulmonary Embolism (PE) W or WO Contrast  Result Date: 11/05/2021 CLINICAL DATA:  PE suspected, low/intermediate prob, positive D-dimer hypoxia. Chest pain and shortness of breath. Hypoxia. EXAM: CT ANGIOGRAPHY CHEST WITH CONTRAST TECHNIQUE: Multidetector CT imaging of the chest was performed using the standard protocol during bolus administration of intravenous contrast. Multiplanar CT image reconstructions and MIPs were obtained to  evaluate the vascular anatomy. CONTRAST:  8mL OMNIPAQUE IOHEXOL 350 MG/ML SOLN COMPARISON:  None. FINDINGS: Cardiovascular: Satisfactory opacification of the pulmonary arteries to the segmental level. Limited evaluation of the subsegmental level due to motion artifact. No evidence of pulmonary embolism. The main pulmonary artery is normal in caliber. Normal heart size. No significant pericardial effusion. The thoracic aorta is normal in caliber. Mild atherosclerotic plaque of the thoracic aorta. Left anterior descending coronary artery calcifications. Mediastinum/Nodes: No enlarged mediastinal, hilar, or axillary lymph nodes. Thyroid gland, trachea, and esophagus demonstrate no significant findings. Lungs/Pleura: Bilateral lower lobe passive atelectasis. No focal consolidation. 2 mm right apical pulmonary micronodule (7:19). E. No pulmonary mass. Trace bilateral, right greater than left, pleural effusions. No pneumothorax. Upper Abdomen: No acute abnormality. 2.4 cm fluid density lesion within left kidney likely represents a simple renal cyst. Left renal cortical scarring. No acute abnormality. Musculoskeletal: No chest wall abnormality. No suspicious lytic or blastic osseous lesions. No acute displaced fracture. Old healed leg right rib fractures. Old healed left rib fractures. Dextroscoliosis of thoracolumbar spine. Multilevel degenerative changes of the spine. Review of the MIP images confirms the above findings. IMPRESSION: 1. No central or segment pulmonary embolus. Limited evaluation more distally due to motion artifact. 2. Trace bilateral, right greater than left, pleural effusions. Electronically Signed   By: Iven Finn M.D.   On: 11/05/2021 15:57   DG CHEST PORT 1 VIEW  Result Date: 11/05/2021 CLINICAL DATA:  Hypoxia with weakness. EXAM: PORTABLE CHEST 1 VIEW COMPARISON:  May 21, 2021. FINDINGS: The heart size and mediastinal contours are within normal limits. Postsurgical changes are noted in  right upper lobe. Significantly decreased bibasilar opacities are noted suggesting improving atelectasis or infiltrates. Left rib fractures are again noted. IMPRESSION: Significantly improved bibasilar opacities are noted. Electronically Signed   By: Marijo Conception M.D.   On: 11/05/2021 10:42        Scheduled Meds:  enoxaparin (LOVENOX) injection  30 mg Subcutaneous X79T   folic acid  1 mg Oral Daily   LORazepam  0-4 mg Oral Q12H   metoprolol tartrate  5 mg Intravenous Q6H   multivitamin with minerals  1 tablet Oral Daily   potassium chloride  40 mEq Oral Once   sodium bicarbonate  650 mg Oral BID   Continuous Infusions:   LOS: 4 days     Cordelia Poche, MD [facility] 11/06/2021, 9:58 AM  If 7PM-7AM, please contact night-coverage [email or web address]

## 2021-11-06 NOTE — Assessment & Plan Note (Signed)
Baseline creatinine of 0.7-0.9. Creatinine of 1.97 on admission. Improvement with IV fluids.

## 2021-11-06 NOTE — Evaluation (Addendum)
Occupational Therapy Evaluation Patient Details Name: [name] MRN: [id number] DOB: 1941-03-24 Today's Date: 11/06/2021   History of Present Illness [name] is a 80 y.o. female presents after unwitnessed fall at home. She had a scalp laceration which was stapled. CT head, chest, abdomen pelvis, cervical spine without acute disease. PMH: alcohol dependence, alcohol liver disease, HTN, atherosclerosis, GERD, osteoporosis, recurrent falls, subarachnoid hemorrhage   Clinical Impression   Pt agreeable to OT evaluation. Pt appears to be confused at times as seen by not knowing month or situation of hospitalization. Pt presents with general weakness in B UE with need for min to mod A for supine to sit bed mobility with noted posterior lean in sitting. Pt able to stand with min to mod A and take a few steps forward and backward with using of RW. Pt demonstrates poor standing balance with R side lean during functional ambulation. Pt demonstrated no visual deficits during mobility tasks but does report increased difficulty reading since being in the hospital. Pt left in bed with bed alarm on and call bell within reach. Pt will benefit from continued OT in the hospital and recommended venue below to increase strength, balance, and endurance for safe ADL's.        Recommendations for follow up therapy are one component of a multi-disciplinary discharge planning process, led by the attending physician.  Recommendations may be updated based on patient status, additional functional criteria and insurance authorization.   Follow Up Recommendations  Skilled nursing-short term rehab (<3 hours/day)    Assistance Recommended at Discharge Frequent or constant Supervision/Assistance  Functional Status Assessment  Patient has had a recent decline in their functional status and demonstrates the ability to make significant improvements in function in a reasonable and predictable amount of time.   Equipment Recommendations  None recommended by OT    Recommendations for Other Services       Precautions / Restrictions Precautions Precautions: Fall Precaution Comments: monitor HR Restrictions Weight Bearing Restrictions: No      Mobility Bed Mobility Overal bed mobility: Needs Assistance Bed Mobility: Supine to Sit;Sit to Supine     Supine to sit: Mod assist Sit to supine: Min assist;Mod assist   General bed mobility comments: Pt able to sit from supine with HOB elevated with hand held assist initially progressing to more assist to lift torso from the bed. Pt demosntrates very slow movement to get to supine from sit but less physical assist needed.    Transfers Overall transfer level: Needs assistance Equipment used: Rolling walker (2 wheels) Transfers: Sit to/from Stand Sit to Stand: Min assist;Mod assist           General transfer comment: Pt unstead on feet with R side lean noted in standing. Pt able to take a few step forward and backward with assist and using RW.      Balance Overall balance assessment: Needs assistance Sitting-balance support: Bilateral upper extremity supported;Feet supported Sitting balance-Leahy Scale: Fair Sitting balance - Comments: seated at EOB Postural control: Posterior lean Standing balance support: Bilateral upper extremity supported;During functional activity;Reliant on assistive device for balance Standing balance-Leahy Scale: Poor Standing balance comment: poor using RW; R side lean                           ADL either performed or assessed with clinical judgement   ADL Overall ADL's : Needs assistance/impaired  Toilet Transfer: Minimal assistance;Moderate assistance;Stand-pivot;Rolling walker (2 wheels) Toilet Transfer Details (indicate cue type and reason): Partially simulated via pt sit to stand from EOB and ambulation forward and backward with RW.         Functional  mobility during ADLs: Minimal assistance;Moderate assistance;Rolling walker (2 wheels) General ADL Comments: Slow labored movement with general weakness observed from pt during bed and fucntional mobility.     Vision Baseline Vision/History: 1 Wears glasses ("most of the time") Ability to See in Adequate Light: 0 Adequate Patient Visual Report: Eye fatigue/eye pain/headache;Other (comment) (Pt reported that reading has been more difficult in the hospital.)  11/06/21 1103  Vision- Assessment  Vision Assessment? Yes   Convergence Impaired (comment) (L lateral eye drift when attempting convergence.)                  Pertinent Vitals/Pain Pain Assessment: Faces Faces Pain Scale: Hurts little more Pain Location: Reported pain in R LE during bed mobility. Pain Descriptors / Indicators: Grimacing;Moaning Pain Intervention(s): Limited activity within patient's tolerance;Monitored during session;Repositioned     Hand Dominance Right   Extremity/Trunk Assessment Upper Extremity Assessment Upper Extremity Assessment: Generalized weakness   Lower Extremity Assessment Lower Extremity Assessment: Defer to PT evaluation   Cervical / Trunk Assessment Cervical / Trunk Assessment: Normal   Communication Communication Communication: No difficulties   Cognition Arousal/Alertness: Lethargic (Mildly lethargic) Behavior During Therapy: WFL for tasks assessed/performed Overall Cognitive Status: No family/caregiver present to determine baseline cognitive functioning                                 General Comments: Pt aware of place but not situation. Pt did not know the month as well.                      Home Living Family/patient expects to be discharged to:: Skilled nursing facility Living Arrangements: Alone Available Help at Discharge: Personal care attendant;Available PRN/intermittently Type of Home: House Home Access: Stairs to enter [employer]  of Steps: 6 Entrance Stairs-Rails: Right;Left Home Layout: Multi-level;Able to live on main level with bedroom/bathroom Alternate Level Stairs-Number of Steps: flight   Bathroom Shower/Tub: Tub/shower unit;Walk-in shower   Bathroom Toilet: Standard     Home Equipment: Conservation officer, nature (2 wheels);Other (comment) (3 point cane, grab bars in walk-in shower)   Additional Comments: Care attendant 2 to 3 times a week per pt report. History taken from combination of pt report and document review.      Prior Functioning/Environment Prior Level of Function : Patient poor historian/Family not available;Needs assist             Mobility Comments: Pt reports household mobility with RW and PRN use of RW in community. ADLs Comments: Pt reports indpendence with ADL's with assist from personal care attendant for cooing and cleaning.        OT Problem List: Decreased strength;Decreased activity tolerance;Impaired balance (sitting and/or standing);Impaired vision/perception;Decreased cognition      OT Treatment/Interventions: Self-care/ADL training;Therapeutic exercise;Therapeutic activities;Cognitive remediation/compensation;Patient/family education;Balance training;Visual/perceptual remediation/compensation    OT Goals(Current goals can be found in the care plan section) Acute Rehab OT Goals Patient Stated Goal: "live alone" OT Goal Formulation: With patient Time For Goal Achievement: 11/20/21 Potential to Achieve Goals: Fair  OT Frequency: Min 2X/week    End of Session Equipment Utilized During Treatment: Rolling walker (2 wheels)  Activity Tolerance: Patient tolerated treatment well Patient  left: in bed;with call bell/phone within reach;with bed alarm set  OT Visit Diagnosis: Unsteadiness on feet (R26.81);Other abnormalities of gait and mobility (R26.89);Muscle weakness (generalized) (M62.81);History of falling (Z91.81);Other symptoms and signs involving cognitive function                 Time: 0934-1000 OT Time Calculation (min): 26 min Charges:  OT General Charges $OT Visit: 1 Visit OT Evaluation $OT Eval Low Complexity: 1 Low  Haydee Jabbour OT, MOT  Larey Seat 11/06/2021, 11:16 AM

## 2021-11-06 NOTE — Assessment & Plan Note (Signed)
Secondary to fall. Two staples placed in the ED on 12/3. Recommendations to remove two weeks after placement (~12/17)

## 2021-11-06 NOTE — Assessment & Plan Note (Addendum)
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 Normal on admission with downtrend likely related to normalization after IV fluid resuscitation. Stable. Outpatient management

## 2021-11-06 NOTE — Assessment & Plan Note (Addendum)
Hypoxia with SpO2 down to 70% per documentation; started on supplemental oxygen. CTA chest ordered with evidence of trace pleural effusions. Weaned to room air.

## 2021-11-06 NOTE — TOC Progression Note (Signed)
30 Day Note   Patient Details  Name: [name] MRN: [id number] Date of Birth: [birth year]  Transition of Care [facility]) CM/SW Contact  Boneta Lucks, RN Phone Number: [phone] 11/06/2021, 3:42 PM  To whom it May Concern: Please be advised that the above name patient will require a short-term nursing home stay- anticipated 30 days or less rehabilitation and strengthening. The plan is for return home.    CIWA score- 0

## 2021-11-06 NOTE — Assessment & Plan Note (Signed)
Likely related to AKI. Resolved.

## 2021-11-06 NOTE — Assessment & Plan Note (Addendum)
Possible underlying dementia. Patient started on high dose thiamine 500 mg IV. Confusion appears to have improved. Thiamine 100 mg PO daily.

## 2021-11-07 DIAGNOSIS — E872 Acidosis, unspecified: Secondary | ICD-10-CM

## 2021-11-07 DIAGNOSIS — R000 Tachycardia, unspecified: Secondary | ICD-10-CM

## 2021-11-07 LAB — BASIC METABOLIC PANEL
Anion gap: 8 (ref 5–15)
BUN: 14 mg/dL (ref 8–23)
CO2: 19 mmol/L — ABNORMAL LOW (ref 22–32)
Calcium: 8.3 mg/dL — ABNORMAL LOW (ref 8.9–10.3)
Chloride: 112 mmol/L — ABNORMAL HIGH (ref 98–111)
Creatinine, Ser: 0.98 mg/dL (ref 0.44–1.00)
GFR, Estimated: 58 mL/min — ABNORMAL LOW (ref >60)
Glucose, Bld: 119 mg/dL — ABNORMAL HIGH (ref 70–99)
Potassium: 3 mmol/L — ABNORMAL LOW (ref 3.5–5.1)
Sodium: 139 mmol/L (ref 135–145)

## 2021-11-07 LAB — CBC
HCT: 24.7 % — ABNORMAL LOW (ref 36.0–46.0)
Hemoglobin: 8 g/dL — ABNORMAL LOW (ref 12.0–15.0)
MCH: 33.3 pg (ref 26.0–34.0)
MCHC: 32.4 g/dL (ref 30.0–36.0)
MCV: 102.9 fL — ABNORMAL HIGH (ref 80.0–100.0)
Platelets: 73 10*3/uL — ABNORMAL LOW (ref 150–400)
RBC: 2.4 MIL/uL — ABNORMAL LOW (ref 3.87–5.11)
RDW: 12.9 % (ref 11.5–15.5)
WBC: 2.8 10*3/uL — ABNORMAL LOW (ref 4.0–10.5)
nRBC: 0 % (ref 0.0–0.2)

## 2021-11-07 MED ORDER — POTASSIUM CHLORIDE CRYS ER 20 MEQ PO TBCR
40.0000 meq | EXTENDED_RELEASE_TABLET | ORAL | Status: AC
  Administered 2021-11-07 (×2): 40 meq via ORAL
  Filled 2021-11-07 (×2): qty 2

## 2021-11-07 MED ORDER — METOPROLOL SUCCINATE ER 50 MG PO TB24
100.0000 mg | ORAL_TABLET | Freq: Every day | ORAL | Status: DC
  Administered 2021-11-07: 100 mg via ORAL
  Filled 2021-11-07: qty 2

## 2021-11-07 MED ORDER — ADULT MULTIVITAMIN W/MINERALS CH: 1.0000 | ORAL_TABLET | Freq: Every day | ORAL | Status: AC

## 2021-11-07 MED ORDER — SODIUM BICARBONATE 650 MG PO TABS: 650.0000 mg | ORAL_TABLET | Freq: Two times a day (BID) | ORAL | Status: AC

## 2021-11-07 NOTE — Assessment & Plan Note (Signed)
Secondary to AKI. Started on sodium bicarbonate. Discharged with one week of sodium bicarbonate. Please recheck BMP to ensure resolution.

## 2021-11-07 NOTE — Care Management Important Message (Signed)
Important Message  Patient Details  Name: [name] MRN: [id number] Date of Birth: 01/04/1941   Medicare Important Message Given:  Yes     Tommy Medal 11/07/2021, 11:46 AM

## 2021-11-07 NOTE — Discharge Summary (Signed)
Physician Discharge Summary  [name] JME:[id number] DOB: 27-Feb-1941 DOA: 11/02/2021  PCP: Asencion Noble, MD  Admit date: 11/02/2021 Discharge date: 11/07/2021  Admitted From: Home Disposition: SNF  Recommendations for Outpatient Follow-up:  Follow up with PCP in 1 week Please obtain BMP/CBC in 5-7 days Please follow up on the following pending results: None  Discharge Condition: Stable CODE STATUS: Full code Diet recommendation: Low sodium diet   Brief/Interim Summary:  Admission HPI written by Truett Mainland, DO   HPI: [name] is a 80 y.o. female with a history of alcohol dependence, alcohol liver disease, hypertension, atherosclerosis, GERD, osteoporosis, hypertension.  Patient is an unreliable historian as there is some concern that she has dementia.  She presents after having a fall at home that was unwitnessed.  Her caregiver found her on the floor when she went to check on her today and the patient was unable to get up.  Patient is confused somewhat initially on arrival and gradually returned back to baseline.  She had a laceration on her head and was brought to the hospital for evaluation.  No palliating or provoking factors.  She seems to be regaining her baseline.   The patient is on some medications, but she is unable to say what medications she takes.  According to the medical records, she is on a potassium supplement, on Cozaar, and on spironolactone.   Hospital course:  * AKI (acute kidney injury) (HCC)-resolved as of 11/07/2021 Baseline creatinine of 0.7-0.9. Creatinine of 1.97 on admission. Improvement with IV fluids.  Tachycardia Mostly sinus rhythm with some possible sinus arrhythmia vs atrial fibrillation. Likely precipitated by discontinuation of metoprolol IV and resultant rebound tachycardia. EKG with sinus rhythm. High risk for hemorrhage with known alcohol use with no plan for quitting. Discussed with daughter. Will continue home metoprolol  and recommend outpatient Transthoracic Echocardiogram as needed.  Scalp laceration Secondary to fall. Two staples placed in the ED on 12/3. Recommendations to remove two weeks after placement (~12/17)  Wernicke's encephalopathy Possible underlying dementia. Patient started on high dose thiamine 500 mg IV. Confusion appears to have improved. Thiamine 100 mg PO daily.  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[place]) Patient drinks several glasses of wine as an outpatient. Started on CIWA on admission.  TSH elevation TSH of 9.548. Normal free T4. Possibly subclinical hypothyroidism. May need treatment if TSH continues to climb. Repeat TSH in 4-6 weeks.  Thrombocytopenia ([place]) Chronic issue and likely related to alcohol use and possible liver disease. Normal on admission with downtrend likely related to normalization after IV fluid resuscitation. Stable. Outpatient management  Hypokalemia Chronic issue. Continue potassium supplementation on discharge.  Acute respiratory failure with hypoxia (HCC)-resolved as of 11/07/2021 Hypoxia with SpO2 down to 70% per documentation; started on supplemental oxygen. CTA chest ordered with evidence of trace pleural effusions. Weaned to room air.  Hyperkalemia-resolved as of 11/06/2021 Likely related to AKI. Resolved.   Discharge Diagnoses:  Principal Problem:   AKI (acute kidney injury) ([place]) Active Problems:   Hypokalemia   Thrombocytopenia (HCC)   TSH elevation   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[place])   Fall at home, initial encounter   Wernicke's encephalopathy   Scalp laceration   Acute respiratory failure with hypoxia ([place])    Discharge Instructions   Allergies as of 11/07/2021       Reactions   Clindamycin Rash   Clindamycin/lincomycin Rash   Doxycycline Rash   Doxycycline Hyclate Rash  Medication List     STOP taking these medications    cephALEXin 500 MG  capsule Commonly known as: KEFLEX   cyanocobalamin 1000 MCG/ML injection Commonly known as: (VITAMIN B-12)   denosumab 60 MG/ML Sosy injection Commonly known as: PROLIA   HYDROcodone-acetaminophen 5-325 MG tablet Commonly known as: NORCO/VICODIN   losartan 25 MG tablet Commonly known as: COZAAR   ondansetron 4 MG disintegrating tablet Commonly known as: Zofran ODT       TAKE these medications    Biotin 2.5 MG Tabs Take 1 tablet by mouth daily.   CALCIUM + D3 PO Take 1 tablet by mouth daily with breakfast.   cycloSPORINE 0.05 % ophthalmic emulsion Commonly known as: RESTASIS Place 1 drop into both eyes 2 (two) times daily. What changed: Another medication with the same name was removed. Continue taking this medication, and follow the directions you see here.   escitalopram 20 MG tablet Commonly known as: LEXAPRO Take 1 tablet (20 mg total) by mouth in the morning.   folic acid 1 MG tablet Commonly known as: FOLVITE Take 1 tablet (1 mg total) by mouth daily.   metoprolol succinate 100 MG 24 hr tablet Commonly known as: TOPROL-XL Take 1 tablet (100 mg total) by mouth in the morning. Take with or immediately following a meal.   multivitamin with minerals Tabs tablet Take 1 tablet by mouth daily. Start taking on: November 08, 2021   potassium citrate 10 MEQ (1080 MG) SR tablet Commonly known as: UROCIT-K Take 10 mEq by mouth daily.   sodium bicarbonate 650 MG tablet Take 1 tablet (650 mg total) by mouth 2 (two) times daily for 7 days.   spironolactone 25 MG tablet Commonly known as: ALDACTONE Take 1 tablet (25 mg total) by mouth daily.   thiamine 100 MG tablet Take 100 mg by mouth daily.        Allergies  Allergen Reactions   Clindamycin Rash   Clindamycin/Lincomycin Rash   Doxycycline Rash   Doxycycline Hyclate Rash    Consultations: None   Procedures/Studies: CT Head Wo Contrast  Result Date: 11/02/2021 CLINICAL DATA:  Multiple falls  EXAM: CT HEAD WITHOUT CONTRAST CT CERVICAL SPINE WITHOUT CONTRAST TECHNIQUE: Multidetector CT imaging of the head and cervical spine was performed following the standard protocol without intravenous contrast. Multiplanar CT image reconstructions of the cervical spine were also generated. COMPARISON:  05/18/2021 FINDINGS: CT HEAD FINDINGS Brain: No evidence of acute infarction, hemorrhage, hydrocephalus, extra-axial collection or mass lesion/mass effect. Mild periventricular and deep white matter hypodensity. Mild global cerebral volume loss. Vascular: No hyperdense vessel or unexpected calcification. Skull: Normal. Negative for fracture or focal lesion. Sinuses/Orbits: No acute finding. Other: None. CT CERVICAL SPINE FINDINGS Alignment: Normal. Skull base and vertebrae: No acute fracture. No primary bone lesion or focal pathologic process. Soft tissues and spinal canal: No prevertebral fluid or swelling. No visible canal hematoma. Disc levels: Moderate multilevel disc space height loss and osteophytosis. Upper chest: Wedge resection of the right pulmonary apex. Other: None. IMPRESSION: 1. No acute intracranial pathology. Small-vessel white matter disease and global cerebral volume loss. 2. No fracture or static subluxation of the cervical spine. 3. Moderate multilevel cervical disc degenerative disease. Electronically Signed   By: Delanna Ahmadi M.D.   On: 11/02/2021 12:26   CT Angio Chest Pulmonary Embolism (PE) W or WO Contrast  Result Date: 11/05/2021 CLINICAL DATA:  PE suspected, low/intermediate prob, positive D-dimer hypoxia. Chest pain and shortness of breath. Hypoxia. EXAM: CT ANGIOGRAPHY  CHEST WITH CONTRAST TECHNIQUE: Multidetector CT imaging of the chest was performed using the standard protocol during bolus administration of intravenous contrast. Multiplanar CT image reconstructions and MIPs were obtained to evaluate the vascular anatomy. CONTRAST:  85mL OMNIPAQUE IOHEXOL 350 MG/ML SOLN COMPARISON:   None. FINDINGS: Cardiovascular: Satisfactory opacification of the pulmonary arteries to the segmental level. Limited evaluation of the subsegmental level due to motion artifact. No evidence of pulmonary embolism. The main pulmonary artery is normal in caliber. Normal heart size. No significant pericardial effusion. The thoracic aorta is normal in caliber. Mild atherosclerotic plaque of the thoracic aorta. Left anterior descending coronary artery calcifications. Mediastinum/Nodes: No enlarged mediastinal, hilar, or axillary lymph nodes. Thyroid gland, trachea, and esophagus demonstrate no significant findings. Lungs/Pleura: Bilateral lower lobe passive atelectasis. No focal consolidation. 2 mm right apical pulmonary micronodule (7:19). E. No pulmonary mass. Trace bilateral, right greater than left, pleural effusions. No pneumothorax. Upper Abdomen: No acute abnormality. 2.4 cm fluid density lesion within left kidney likely represents a simple renal cyst. Left renal cortical scarring. No acute abnormality. Musculoskeletal: No chest wall abnormality. No suspicious lytic or blastic osseous lesions. No acute displaced fracture. Old healed leg right rib fractures. Old healed left rib fractures. Dextroscoliosis of thoracolumbar spine. Multilevel degenerative changes of the spine. Review of the MIP images confirms the above findings. IMPRESSION: 1. No central or segment pulmonary embolus. Limited evaluation more distally due to motion artifact. 2. Trace bilateral, right greater than left, pleural effusions. Electronically Signed   By: Iven Finn M.D.   On: 11/05/2021 15:57   CT Cervical Spine Wo Contrast  Result Date: 11/02/2021 CLINICAL DATA:  Multiple falls EXAM: CT HEAD WITHOUT CONTRAST CT CERVICAL SPINE WITHOUT CONTRAST TECHNIQUE: Multidetector CT imaging of the head and cervical spine was performed following the standard protocol without intravenous contrast. Multiplanar CT image reconstructions of the  cervical spine were also generated. COMPARISON:  05/18/2021 FINDINGS: CT HEAD FINDINGS Brain: No evidence of acute infarction, hemorrhage, hydrocephalus, extra-axial collection or mass lesion/mass effect. Mild periventricular and deep white matter hypodensity. Mild global cerebral volume loss. Vascular: No hyperdense vessel or unexpected calcification. Skull: Normal. Negative for fracture or focal lesion. Sinuses/Orbits: No acute finding. Other: None. CT CERVICAL SPINE FINDINGS Alignment: Normal. Skull base and vertebrae: No acute fracture. No primary bone lesion or focal pathologic process. Soft tissues and spinal canal: No prevertebral fluid or swelling. No visible canal hematoma. Disc levels: Moderate multilevel disc space height loss and osteophytosis. Upper chest: Wedge resection of the right pulmonary apex. Other: None. IMPRESSION: 1. No acute intracranial pathology. Small-vessel white matter disease and global cerebral volume loss. 2. No fracture or static subluxation of the cervical spine. 3. Moderate multilevel cervical disc degenerative disease. Electronically Signed   By: Delanna Ahmadi M.D.   On: 11/02/2021 12:26   DG CHEST PORT 1 VIEW  Result Date: 11/05/2021 CLINICAL DATA:  Hypoxia with weakness. EXAM: PORTABLE CHEST 1 VIEW COMPARISON:  May 21, 2021. FINDINGS: The heart size and mediastinal contours are within normal limits. Postsurgical changes are noted in right upper lobe. Significantly decreased bibasilar opacities are noted suggesting improving atelectasis or infiltrates. Left rib fractures are again noted. IMPRESSION: Significantly improved bibasilar opacities are noted. Electronically Signed   By: Marijo Conception M.D.   On: 11/05/2021 10:42   CT CHEST ABDOMEN PELVIS WO CONTRAST  Result Date: 11/02/2021 CLINICAL DATA:  Fall, trauma EXAM: CT CHEST, ABDOMEN AND PELVIS WITHOUT CONTRAST TECHNIQUE: Multidetector CT imaging of the chest, abdomen and  pelvis was performed following the standard  protocol without IV contrast. COMPARISON:  05/18/2021 FINDINGS: CT CHEST FINDINGS Cardiovascular: Aortic atherosclerosis. Normal heart size. Left coronary artery calcifications no pericardial effusion. Mediastinum/Nodes: No enlarged mediastinal, hilar, or axillary lymph nodes. Thyroid gland, trachea, and esophagus demonstrate no significant findings. Lungs/Pleura: Wedge resection of the right upper lobe. No pleural effusion or pneumothorax. Musculoskeletal: No chest wall mass or suspicious bone lesions identified. Multiple chronic, callused fractures of the bilateral ribs. CT ABDOMEN PELVIS FINDINGS Hepatobiliary: No solid liver abnormality is seen. No gallstones, gallbladder wall thickening, or biliary dilatation. Pancreas: Unremarkable. No pancreatic ductal dilatation or surrounding inflammatory changes. Spleen: Normal in size without significant abnormality. Adrenals/Urinary Tract: Adrenal glands are unremarkable. Multiple small bilateral nonobstructive renal calculi. No ureteral calculi or hydronephrosis. Bladder is unremarkable. Stomach/Bowel: Stomach is within normal limits. Status post partial right hemicolectomy and ileocolic reanastomosis. No evidence of bowel wall thickening, distention, or inflammatory changes. Vascular/Lymphatic: No significant vascular findings are present. No enlarged abdominal or pelvic lymph nodes. Reproductive: Status post hysterectomy. Other: Small midline ventral abdominal hernia containing a single nonobstructed loop of transverse colon (series 2, image 75). No abdominopelvic ascites. Musculoskeletal: No acute or significant osseous findings. IMPRESSION: 1. No noncontrast evidence of acute traumatic injury to the chest, abdomen, or pelvis. 2. Multiple chronic, callused fractures of the bilateral ribs. 3. Bilateral nonobstructive nephrolithiasis. 4. Wedge resection of the right upper lobe. 5. Partial right hemicolectomy and ileocolic anastomosis. 6. Small midline ventral abdominal  hernia containing a single nonobstructed loop of transverse colon. 7. Coronary artery disease. Aortic Atherosclerosis (ICD10-I70.0). Electronically Signed   By: Delanna Ahmadi M.D.   On: 11/02/2021 14:06      Subjective: No issues this morning. Feels well.  Discharge Exam: Vitals:   11/07/21 0545 11/07/21 1000  BP: 126/70 104/65  Pulse: 80 (!) 104  Resp: 19 18  Temp: 98 F (36.7 C)   SpO2: 92% 100%   Vitals:   11/06/21 2014 11/07/21 0045 11/07/21 0545 11/07/21 1000  BP:  (!) 145/79 126/70 104/65  Pulse:  78 80 (!) 104  Resp:  19 19 18   Temp:  98.1 F (36.7 C) 98 F (36.7 C)   TempSrc:  Oral Oral   SpO2: 99% 99% 92% 100%  Weight:      Height:        General: Pt is alert, awake, not in acute distress Cardiovascular: RRR, S1/S2 +, no rubs, no gallops Respiratory: CTA bilaterally, no wheezing, no rhonchi Abdominal: Soft, NT, ND, bowel sounds + Extremities: no edema, no cyanosis    The results of significant diagnostics from this hospitalization (including imaging, microbiology, ancillary and laboratory) are listed below for reference.     Microbiology: Recent Results (from the past 240 hour(s))  Resp Panel by RT-PCR (Flu A&B, Covid) Nasopharyngeal Swab     Status: None   Collection Time: 11/02/21  2:57 PM   Specimen: Nasopharyngeal Swab; Nasopharyngeal(NP) swabs in vial transport medium  Result Value Ref Range Status   SARS Coronavirus 2 by RT PCR NEGATIVE NEGATIVE Final    Comment: (NOTE) SARS-CoV-2 target nucleic acids are NOT DETECTED.  The SARS-CoV-2 RNA is generally detectable in upper respiratory specimens during the acute phase of infection. The lowest concentration of SARS-CoV-2 viral copies this assay can detect is 138 copies/mL. A negative result does not preclude SARS-Cov-2 infection and should not be used as the sole basis for treatment or other patient management decisions. A negative result may occur with  improper  specimen collection/handling,  submission of specimen other than nasopharyngeal swab, presence of viral mutation(s) within the areas targeted by this assay, and inadequate number of viral copies(<138 copies/mL). A negative result must be combined with clinical observations, patient history, and epidemiological information. The expected result is Negative.  Fact Sheet for Patients:  EntrepreneurPulse.com.au  Fact Sheet for Healthcare Providers:  IncredibleEmployment.be  This test is no t yet approved or cleared by the [place] FDA and  has been authorized for detection and/or diagnosis of SARS-CoV-2 by FDA under an Emergency Use Authorization (EUA). This EUA will remain  in effect (meaning this test can be used) for the duration of the COVID-19 declaration under Section 564(b)(1) of the Act, 21 U.S.C.section 360bbb-3(b)(1), unless the authorization is terminated  or revoked sooner.       Influenza A by PCR NEGATIVE NEGATIVE Final   Influenza B by PCR NEGATIVE NEGATIVE Final    Comment: (NOTE) The Xpert Xpress SARS-CoV-2/FLU/RSV plus assay is intended as an aid in the diagnosis of influenza from Nasopharyngeal swab specimens and should not be used as a sole basis for treatment. Nasal washings and aspirates are unacceptable for Xpert Xpress SARS-CoV-2/FLU/RSV testing.  Fact Sheet for Patients: EntrepreneurPulse.com.au  Fact Sheet for Healthcare Providers: IncredibleEmployment.be  This test is not yet approved or cleared by the [place] FDA and has been authorized for detection and/or diagnosis of SARS-CoV-2 by FDA under an Emergency Use Authorization (EUA). This EUA will remain in effect (meaning this test can be used) for the duration of the COVID-19 declaration under Section 564(b)(1) of the Act, 21 U.S.C. section 360bbb-3(b)(1), unless the authorization is terminated or revoked.  Performed at [facility], [address] [place], [place] [address]      Labs: BNP (last 3 results) No results for input(s): BNP in the last 8760 hours. Basic Metabolic Panel: Recent Labs  Lab 11/03/21 0700 11/04/21 0355 11/05/21 0456 11/06/21 0427 11/06/21 0740 11/07/21 0449  NA 135 136 140 140  --  139  K 3.8 3.6 4.0 2.8* 3.0* 3.0*  CL 109 114* 116* 112*  --  112*  CO2 18* 18* 14* 15*  --  19*  GLUCOSE 82 67* 52* 66*  --  119*  BUN 33* 21 14 13   --  14  CREATININE 1.35* 0.93 1.17* 1.11*  --  0.98  CALCIUM 7.7* 7.6* 8.0* 7.9*  --  8.3*  MG 2.1 2.0 1.8  --  1.8  --    Liver Function Tests: Recent Labs  Lab 11/03/21 0700 11/05/21 0456  AST 31 29  ALT 18 16  ALKPHOS 70 65  BILITOT 0.4 0.5  PROT 5.4* 5.2*  ALBUMIN 3.4* 3.0*   No results for input(s): LIPASE, AMYLASE in the last 168 hours. Recent Labs  Lab 11/05/21 0746  AMMONIA 13   CBC: Recent Labs  Lab 11/02/21 1206 11/03/21 0700 11/04/21 0355 11/05/21 0746 11/07/21 0449  WBC 12.0* 5.5 5.9 11.6* 2.8*  HGB 10.7* 8.4* 8.2* 8.1* 8.0*  HCT 31.8* 25.4* 26.4* 25.7* 24.7*  MCV 99.4 101.2* 103.9* 105.8* 102.9*  PLT 164 108* 82* 81* 73*   Cardiac Enzymes: Recent Labs  Lab 11/03/21 0700  CKTOTAL 181   BNP: Invalid input(s): POCBNP CBG: No results for input(s): GLUCAP in the last 168 hours. D-Dimer Recent Labs    11/05/21 0746  DDIMER 1.10*   Hgb A1c No results for input(s): HGBA1C in the last 72 hours. Lipid Profile No results for input(s): CHOL, HDL,  LDLCALC, TRIG, CHOLHDL, LDLDIRECT in the last 72 hours. Thyroid function studies No results for input(s): TSH, T4TOTAL, T3FREE, THYROIDAB in the last 72 hours.  Invalid input(s): FREET3 Anemia work up No results for input(s): VITAMINB12, FOLATE, FERRITIN, TIBC, IRON, RETICCTPCT in the last 72 hours. Urinalysis    Component Value Date/Time   COLORURINE AMBER (A) 05/18/2021 1236   APPEARANCEUR TURBID (A) 05/18/2021 1236   LABSPEC 1.029 05/18/2021 1236   PHURINE 5.0 05/18/2021 1236    GLUCOSEU NEGATIVE 05/18/2021 1236   HGBUR LARGE (A) 05/18/2021 1236   BILIRUBINUR NEGATIVE 05/18/2021 1236   KETONESUR 5 (A) 05/18/2021 1236   PROTEINUR 30 (A) 05/18/2021 1236   UROBILINOGEN 0.2 09/08/2014 0300   NITRITE NEGATIVE 05/18/2021 1236   LEUKOCYTESUR MODERATE (A) 05/18/2021 1236   Sepsis Labs Invalid input(s): PROCALCITONIN,  WBC,  LACTICIDVEN Microbiology Recent Results (from the past 240 hour(s))  Resp Panel by RT-PCR (Flu A&B, Covid) Nasopharyngeal Swab     Status: None   Collection Time: 11/02/21  2:57 PM   Specimen: Nasopharyngeal Swab; Nasopharyngeal(NP) swabs in vial transport medium  Result Value Ref Range Status   SARS Coronavirus 2 by RT PCR NEGATIVE NEGATIVE Final    Comment: (NOTE) SARS-CoV-2 target nucleic acids are NOT DETECTED.  The SARS-CoV-2 RNA is generally detectable in upper respiratory specimens during the acute phase of infection. The lowest concentration of SARS-CoV-2 viral copies this assay can detect is 138 copies/mL. A negative result does not preclude SARS-Cov-2 infection and should not be used as the sole basis for treatment or other patient management decisions. A negative result may occur with  improper specimen collection/handling, submission of specimen other than nasopharyngeal swab, presence of viral mutation(s) within the areas targeted by this assay, and inadequate number of viral copies(<138 copies/mL). A negative result must be combined with clinical observations, patient history, and epidemiological information. The expected result is Negative.  Fact Sheet for Patients:  EntrepreneurPulse.com.au  Fact Sheet for Healthcare Providers:  IncredibleEmployment.be  This test is no t yet approved or cleared by the [place] FDA and  has been authorized for detection and/or diagnosis of SARS-CoV-2 by FDA under an Emergency Use Authorization (EUA). This EUA will remain  in effect (meaning this  test can be used) for the duration of the COVID-19 declaration under Section 564(b)(1) of the Act, 21 U.S.C.section 360bbb-3(b)(1), unless the authorization is terminated  or revoked sooner.       Influenza A by PCR NEGATIVE NEGATIVE Final   Influenza B by PCR NEGATIVE NEGATIVE Final    Comment: (NOTE) The Xpert Xpress SARS-CoV-2/FLU/RSV plus assay is intended as an aid in the diagnosis of influenza from Nasopharyngeal swab specimens and should not be used as a sole basis for treatment. Nasal washings and aspirates are unacceptable for Xpert Xpress SARS-CoV-2/FLU/RSV testing.  Fact Sheet for Patients: EntrepreneurPulse.com.au  Fact Sheet for Healthcare Providers: IncredibleEmployment.be  This test is not yet approved or cleared by the [place] FDA and has been authorized for detection and/or diagnosis of SARS-CoV-2 by FDA under an Emergency Use Authorization (EUA). This EUA will remain in effect (meaning this test can be used) for the duration of the COVID-19 declaration under Section 564(b)(1) of the Act, 21 U.S.C. section 360bbb-3(b)(1), unless the authorization is terminated or revoked.  Performed at [facility], [address], [place], [place] [address]      Time coordinating discharge: 35 minutes  SIGNED:   Cordelia Poche, MD [facility] 11/07/2021, 11:05 AM

## 2021-11-07 NOTE — Assessment & Plan Note (Addendum)
Mostly sinus rhythm with some possible sinus arrhythmia vs atrial fibrillation. Likely precipitated by discontinuation of metoprolol IV and resultant rebound tachycardia. EKG with sinus rhythm. High risk for hemorrhage with known alcohol use with no plan for quitting. Discussed with daughter. Will continue home metoprolol and recommend outpatient Transthoracic Echocardiogram as needed.

## 2021-11-07 NOTE — Progress Notes (Signed)
Report called to Nunzio Cory at [facility].

## 2021-11-07 NOTE — TOC Transition Note (Signed)
Transition of Care [facility]) - CM/SW Discharge Note   Patient Details  Name: [name] MRN: [id number] Date of Birth: 06/22/1941  Transition of Care [facility]) CM/SW Contact:  Iona Beard, [place] Phone Number: 11/07/2021, 2:34 PM   Clinical Narrative:    TOC notified that pt was ready for D/C. CSW checked PASRR and pt has received a 30 day PASRR number. CSW spoke to pts daughter who states she would like to accept bed at [facility]. CSW spoke to admissions at [facility] who states that they are able to accept today and pts daughter would need to sign paperwork. CSW confirmed paperwork had been signed and pt is able to arrive today. CSW provided pts RN with room number and number for report to be called. CSW completed med necessity and printed it to the floor. CSW called for EMS transport and pt has been added to the schedule. CSW updated pts daughter to make aware of d/c. TOC signing off.   Final next level of care: Skilled Nursing Facility Barriers to Discharge: Continued Medical Work up   Patient Goals and CMS Choice Patient states their goals for this hospitalization and ongoing recovery are:: Go to SNF CMS Medicare.gov Compare Post Acute Care list provided to:: Patient Choice offered to / list presented to : Patient  Discharge Placement                Patient to be transferred to facility by: EMS Name of family member notified: Marlyn Corporal Patient and family notified of of transfer: 11/07/21  Discharge Plan and Services                                     Social Determinants of Health (SDOH) Interventions     Readmission Risk Interventions Readmission Risk Prevention Plan 11/06/2021  Transportation Screening Complete  Home Care Screening Complete  Medication Review (RN CM) Complete  Some recent data might be hidden

## 2021-11-08 DIAGNOSIS — M6282 Rhabdomyolysis: Secondary | ICD-10-CM | POA: Diagnosis not present

## 2021-11-08 DIAGNOSIS — I7090 Unspecified atherosclerosis: Secondary | ICD-10-CM | POA: Diagnosis not present

## 2021-11-08 DIAGNOSIS — R7989 Other specified abnormal findings of blood chemistry: Secondary | ICD-10-CM | POA: Diagnosis not present

## 2021-11-08 DIAGNOSIS — S0101XD Laceration without foreign body of scalp, subsequent encounter: Secondary | ICD-10-CM | POA: Diagnosis not present

## 2021-11-08 DIAGNOSIS — G9340 Encephalopathy, unspecified: Secondary | ICD-10-CM | POA: Diagnosis not present

## 2021-11-08 DIAGNOSIS — I10 Essential (primary) hypertension: Secondary | ICD-10-CM | POA: Diagnosis not present

## 2021-11-08 DIAGNOSIS — I5032 Chronic diastolic (congestive) heart failure: Secondary | ICD-10-CM | POA: Diagnosis not present

## 2021-11-08 DIAGNOSIS — I509 Heart failure, unspecified: Secondary | ICD-10-CM | POA: Diagnosis not present

## 2021-11-08 DIAGNOSIS — R269 Unspecified abnormalities of gait and mobility: Secondary | ICD-10-CM | POA: Diagnosis not present

## 2021-11-08 DIAGNOSIS — I5020 Unspecified systolic (congestive) heart failure: Secondary | ICD-10-CM | POA: Diagnosis not present

## 2021-11-08 DIAGNOSIS — D696 Thrombocytopenia, unspecified: Secondary | ICD-10-CM | POA: Diagnosis not present

## 2021-11-08 DIAGNOSIS — K769 Liver disease, unspecified: Secondary | ICD-10-CM | POA: Diagnosis not present

## 2021-11-08 DIAGNOSIS — R531 Weakness: Secondary | ICD-10-CM | POA: Diagnosis not present

## 2021-11-08 DIAGNOSIS — K219 Gastro-esophageal reflux disease without esophagitis: Secondary | ICD-10-CM | POA: Diagnosis not present

## 2021-11-08 DIAGNOSIS — D649 Anemia, unspecified: Secondary | ICD-10-CM | POA: Diagnosis not present

## 2021-11-08 DIAGNOSIS — M810 Age-related osteoporosis without current pathological fracture: Secondary | ICD-10-CM | POA: Diagnosis not present

## 2021-11-12 DIAGNOSIS — R531 Weakness: Secondary | ICD-10-CM | POA: Diagnosis not present

## 2021-11-12 DIAGNOSIS — K769 Liver disease, unspecified: Secondary | ICD-10-CM | POA: Diagnosis not present

## 2021-11-12 DIAGNOSIS — K219 Gastro-esophageal reflux disease without esophagitis: Secondary | ICD-10-CM | POA: Diagnosis not present

## 2021-11-12 DIAGNOSIS — I10 Essential (primary) hypertension: Secondary | ICD-10-CM | POA: Diagnosis not present

## 2021-11-12 DIAGNOSIS — I5032 Chronic diastolic (congestive) heart failure: Secondary | ICD-10-CM | POA: Diagnosis not present

## 2021-11-12 DIAGNOSIS — I5020 Unspecified systolic (congestive) heart failure: Secondary | ICD-10-CM | POA: Diagnosis not present

## 2021-11-19 DIAGNOSIS — I5032 Chronic diastolic (congestive) heart failure: Secondary | ICD-10-CM | POA: Diagnosis not present

## 2021-11-19 DIAGNOSIS — R531 Weakness: Secondary | ICD-10-CM | POA: Diagnosis not present

## 2021-11-19 DIAGNOSIS — K219 Gastro-esophageal reflux disease without esophagitis: Secondary | ICD-10-CM | POA: Diagnosis not present

## 2021-11-19 DIAGNOSIS — I10 Essential (primary) hypertension: Secondary | ICD-10-CM | POA: Diagnosis not present

## 2021-11-20 DIAGNOSIS — I5032 Chronic diastolic (congestive) heart failure: Secondary | ICD-10-CM | POA: Diagnosis not present

## 2021-11-20 DIAGNOSIS — F32A Depression, unspecified: Secondary | ICD-10-CM | POA: Diagnosis not present

## 2021-11-20 DIAGNOSIS — R531 Weakness: Secondary | ICD-10-CM | POA: Diagnosis not present

## 2021-11-20 DIAGNOSIS — I10 Essential (primary) hypertension: Secondary | ICD-10-CM | POA: Diagnosis not present

## 2021-11-20 DIAGNOSIS — I5020 Unspecified systolic (congestive) heart failure: Secondary | ICD-10-CM | POA: Diagnosis not present

## 2021-11-20 DIAGNOSIS — L0291 Cutaneous abscess, unspecified: Secondary | ICD-10-CM | POA: Diagnosis not present

## 2021-11-21 DIAGNOSIS — L97229 Non-pressure chronic ulcer of left calf with unspecified severity: Secondary | ICD-10-CM | POA: Diagnosis not present

## 2021-11-21 DIAGNOSIS — F419 Anxiety disorder, unspecified: Secondary | ICD-10-CM | POA: Diagnosis not present

## 2021-11-28 DIAGNOSIS — F32A Depression, unspecified: Secondary | ICD-10-CM | POA: Diagnosis not present

## 2021-11-28 DIAGNOSIS — I10 Essential (primary) hypertension: Secondary | ICD-10-CM | POA: Diagnosis not present

## 2021-11-28 DIAGNOSIS — I5032 Chronic diastolic (congestive) heart failure: Secondary | ICD-10-CM | POA: Diagnosis not present

## 2021-11-28 DIAGNOSIS — L97229 Non-pressure chronic ulcer of left calf with unspecified severity: Secondary | ICD-10-CM | POA: Diagnosis not present

## 2021-11-28 DIAGNOSIS — R531 Weakness: Secondary | ICD-10-CM | POA: Diagnosis not present

## 2021-11-28 DIAGNOSIS — L0291 Cutaneous abscess, unspecified: Secondary | ICD-10-CM | POA: Diagnosis not present

## 2021-12-03 DIAGNOSIS — L0291 Cutaneous abscess, unspecified: Secondary | ICD-10-CM | POA: Diagnosis not present

## 2021-12-03 DIAGNOSIS — R531 Weakness: Secondary | ICD-10-CM | POA: Diagnosis not present

## 2021-12-03 DIAGNOSIS — F32A Depression, unspecified: Secondary | ICD-10-CM | POA: Diagnosis not present

## 2021-12-03 DIAGNOSIS — I5032 Chronic diastolic (congestive) heart failure: Secondary | ICD-10-CM | POA: Diagnosis not present

## 2021-12-03 DIAGNOSIS — K219 Gastro-esophageal reflux disease without esophagitis: Secondary | ICD-10-CM | POA: Diagnosis not present

## 2021-12-04 ENCOUNTER — Other Ambulatory Visit: Payer: Self-pay | Admitting: *Deleted

## 2021-12-04 NOTE — Patient Outreach (Signed)
Per [place] eligible member resides in [facility]. Screened for potential [facility] Care Management services.   Spoke with [name] SNF SW who reports [name] will transition home today with [facility].   Writer will plan outreach to discuss [place] Management services.    Angela Rolling, MSN, [name],[name] [place] Acute Care Coordinator [phone] [facility]) [phone]  (Toll free office)

## 2021-12-06 ENCOUNTER — Other Ambulatory Visit: Payer: Self-pay | Admitting: *Deleted

## 2021-12-06 NOTE — Patient Outreach (Signed)
[place] Coordinator follow up. Mrs. Kosloski transitioned to home from [facility] SNF on 12/04/20. Will have Wanda.   Member's PCP Dr. Asencion Noble has [facility] care management services available.   Notification of discharge date of 12/04/20 sent to [facility] team.   [place] Management will not follow. [facility] care management services are available to member post SNF.   Marthenia Rolling, MSN, RN,BSN [place] Acute Care Coordinator [phone] [facility]) [phone]  (Toll free office)

## 2021-12-12 ENCOUNTER — Other Ambulatory Visit: Payer: Self-pay | Admitting: *Deleted

## 2021-12-12 DIAGNOSIS — I10 Essential (primary) hypertension: Secondary | ICD-10-CM

## 2021-12-12 NOTE — Patient Outreach (Signed)
[facility] Post- Acute Care Coordinator follow up. Verified with SNF SW Mrs. Baskins previously transitioned to home from [facility] on 12/04/21 with [facility].   Writer initially notified [facility] team. However, writer made aware [facility] is not in PCP office yet.   Telephone call made to Mrs. Copado [phone] to discuss [place] Management services. Mrs. Heldman states she is unable to talk right now. Asked that writer call her back.  Will call make referral to [facility] for care coordination. Mrs. Fabre lives alone. Has history of HTN, GERD, alcohol liver disease.  Marthenia Rolling, MSN, RN,BSN [place] Acute Care Coordinator [phone] [facility]) [phone]  (Toll free office)

## 2021-12-16 ENCOUNTER — Ambulatory Visit: Payer: Medicare Other | Admitting: Neurology

## 2021-12-17 ENCOUNTER — Other Ambulatory Visit: Payer: Self-pay | Admitting: *Deleted

## 2021-12-17 NOTE — Patient Outreach (Addendum)
[facility]) Care Management  12/17/2021  MANHATTAN MCCUEN [birth year] [id number]   Telephone Assessment-Declined  RN spoke wit pt today verified credentials and introduced [facility] services and the purpose for today's call. Pt adamantly declined services and refused to further discussion services. Pt aware we communicate with her primary provider however again declined further engagement via [facility] services. Pt declined information packet about [facility] services.  Will close this case and notify primary provider of pt's declined of services.  Raina Mina, RN Care Management Coordinator [place] Office [phone]

## 2021-12-20 ENCOUNTER — Other Ambulatory Visit: Payer: Self-pay | Admitting: Physician Assistant

## 2021-12-31 ENCOUNTER — Ambulatory Visit: Payer: Medicare Other | Admitting: Neurology

## 2022-01-17 ENCOUNTER — Emergency Department (HOSPITAL_COMMUNITY): Payer: Medicare Other

## 2022-01-17 ENCOUNTER — Inpatient Hospital Stay (HOSPITAL_COMMUNITY)
Admission: EM | Admit: 2022-01-17 | Discharge: 2022-01-25 | DRG: 871 | Disposition: A | Discharge status: Home Health | Payer: Medicare Other | Attending: Internal Medicine | Admitting: Internal Medicine

## 2022-01-17 ENCOUNTER — Encounter (HOSPITAL_COMMUNITY): Payer: Self-pay | Admitting: *Deleted

## 2022-01-17 DIAGNOSIS — R197 Diarrhea, unspecified: Secondary | ICD-10-CM | POA: Diagnosis not present

## 2022-01-17 DIAGNOSIS — R569 Unspecified convulsions: Secondary | ICD-10-CM | POA: Diagnosis present

## 2022-01-17 DIAGNOSIS — R6520 Severe sepsis without septic shock: Secondary | ICD-10-CM | POA: Diagnosis not present

## 2022-01-17 DIAGNOSIS — A4151 Sepsis due to Escherichia coli [E. coli]: Secondary | ICD-10-CM | POA: Diagnosis not present

## 2022-01-17 DIAGNOSIS — R059 Cough, unspecified: Secondary | ICD-10-CM | POA: Diagnosis not present

## 2022-01-17 DIAGNOSIS — K529 Noninfective gastroenteritis and colitis, unspecified: Secondary | ICD-10-CM

## 2022-01-17 DIAGNOSIS — M7989 Other specified soft tissue disorders: Secondary | ICD-10-CM | POA: Diagnosis not present

## 2022-01-17 DIAGNOSIS — D696 Thrombocytopenia, unspecified: Secondary | ICD-10-CM | POA: Diagnosis not present

## 2022-01-17 DIAGNOSIS — Z90710 Acquired absence of both cervix and uterus: Secondary | ICD-10-CM

## 2022-01-17 DIAGNOSIS — A414 Sepsis due to anaerobes: Secondary | ICD-10-CM | POA: Diagnosis present

## 2022-01-17 DIAGNOSIS — R7989 Other specified abnormal findings of blood chemistry: Secondary | ICD-10-CM | POA: Diagnosis not present

## 2022-01-17 DIAGNOSIS — I10 Essential (primary) hypertension: Secondary | ICD-10-CM | POA: Diagnosis present

## 2022-01-17 DIAGNOSIS — E0781 Sick-euthyroid syndrome: Secondary | ICD-10-CM | POA: Diagnosis present

## 2022-01-17 DIAGNOSIS — F419 Anxiety disorder, unspecified: Secondary | ICD-10-CM | POA: Diagnosis present

## 2022-01-17 DIAGNOSIS — A0472 Enterocolitis due to Clostridium difficile, not specified as recurrent: Secondary | ICD-10-CM | POA: Diagnosis present

## 2022-01-17 DIAGNOSIS — F32A Depression, unspecified: Secondary | ICD-10-CM | POA: Diagnosis present

## 2022-01-17 DIAGNOSIS — R911 Solitary pulmonary nodule: Secondary | ICD-10-CM | POA: Diagnosis not present

## 2022-01-17 DIAGNOSIS — R609 Edema, unspecified: Secondary | ICD-10-CM

## 2022-01-17 DIAGNOSIS — F1020 Alcohol dependence, uncomplicated: Secondary | ICD-10-CM | POA: Diagnosis present

## 2022-01-17 DIAGNOSIS — A419 Sepsis, unspecified organism: Secondary | ICD-10-CM | POA: Diagnosis present

## 2022-01-17 DIAGNOSIS — Z681 Body mass index (BMI) 19 or less, adult: Secondary | ICD-10-CM | POA: Diagnosis not present

## 2022-01-17 DIAGNOSIS — D649 Anemia, unspecified: Secondary | ICD-10-CM | POA: Diagnosis present

## 2022-01-17 DIAGNOSIS — M5030 Other cervical disc degeneration, unspecified cervical region: Secondary | ICD-10-CM | POA: Diagnosis present

## 2022-01-17 DIAGNOSIS — M1990 Unspecified osteoarthritis, unspecified site: Secondary | ICD-10-CM | POA: Diagnosis present

## 2022-01-17 DIAGNOSIS — E441 Mild protein-calorie malnutrition: Secondary | ICD-10-CM | POA: Diagnosis present

## 2022-01-17 DIAGNOSIS — Z881 Allergy status to other antibiotic agents status: Secondary | ICD-10-CM | POA: Diagnosis not present

## 2022-01-17 DIAGNOSIS — Z801 Family history of malignant neoplasm of trachea, bronchus and lung: Secondary | ICD-10-CM

## 2022-01-17 DIAGNOSIS — Z9049 Acquired absence of other specified parts of digestive tract: Secondary | ICD-10-CM

## 2022-01-17 DIAGNOSIS — I700 Atherosclerosis of aorta: Secondary | ICD-10-CM | POA: Diagnosis not present

## 2022-01-17 DIAGNOSIS — K219 Gastro-esophageal reflux disease without esophagitis: Secondary | ICD-10-CM | POA: Diagnosis present

## 2022-01-17 DIAGNOSIS — Z66 Do not resuscitate: Secondary | ICD-10-CM | POA: Diagnosis not present

## 2022-01-17 DIAGNOSIS — S40022A Contusion of left upper arm, initial encounter: Secondary | ICD-10-CM | POA: Diagnosis not present

## 2022-01-17 DIAGNOSIS — R5381 Other malaise: Secondary | ICD-10-CM | POA: Diagnosis present

## 2022-01-17 DIAGNOSIS — R451 Restlessness and agitation: Secondary | ICD-10-CM | POA: Diagnosis present

## 2022-01-17 DIAGNOSIS — Z79899 Other long term (current) drug therapy: Secondary | ICD-10-CM

## 2022-01-17 DIAGNOSIS — Z20822 Contact with and (suspected) exposure to covid-19: Secondary | ICD-10-CM | POA: Diagnosis present

## 2022-01-17 DIAGNOSIS — D6959 Other secondary thrombocytopenia: Secondary | ICD-10-CM | POA: Diagnosis present

## 2022-01-17 DIAGNOSIS — Z87442 Personal history of urinary calculi: Secondary | ICD-10-CM

## 2022-01-17 DIAGNOSIS — R109 Unspecified abdominal pain: Secondary | ICD-10-CM | POA: Diagnosis not present

## 2022-01-17 DIAGNOSIS — E43 Unspecified severe protein-calorie malnutrition: Secondary | ICD-10-CM | POA: Diagnosis present

## 2022-01-17 DIAGNOSIS — K709 Alcoholic liver disease, unspecified: Secondary | ICD-10-CM | POA: Diagnosis present

## 2022-01-17 DIAGNOSIS — E876 Hypokalemia: Secondary | ICD-10-CM | POA: Diagnosis present

## 2022-01-17 DIAGNOSIS — M810 Age-related osteoporosis without current pathological fracture: Secondary | ICD-10-CM | POA: Diagnosis present

## 2022-01-17 DIAGNOSIS — Z85118 Personal history of other malignant neoplasm of bronchus and lung: Secondary | ICD-10-CM

## 2022-01-17 DIAGNOSIS — Z87891 Personal history of nicotine dependence: Secondary | ICD-10-CM

## 2022-01-17 DIAGNOSIS — F1021 Alcohol dependence, in remission: Secondary | ICD-10-CM | POA: Diagnosis not present

## 2022-01-17 LAB — CBC WITH DIFFERENTIAL/PLATELET
Abs Immature Granulocytes: 0.04 10*3/uL (ref 0.00–0.07)
Basophils Absolute: 0 10*3/uL (ref 0.0–0.1)
Basophils Relative: 0 %
Eosinophils Absolute: 0 10*3/uL (ref 0.0–0.5)
Eosinophils Relative: 0 %
HCT: 30.1 % — ABNORMAL LOW (ref 36.0–46.0)
Hemoglobin: 9.7 g/dL — ABNORMAL LOW (ref 12.0–15.0)
Immature Granulocytes: 1 %
Lymphocytes Relative: 12 %
Lymphs Abs: 0.8 10*3/uL (ref 0.7–4.0)
MCH: 30.6 pg (ref 26.0–34.0)
MCHC: 32.2 g/dL (ref 30.0–36.0)
MCV: 95 fL (ref 80.0–100.0)
Monocytes Absolute: 1.1 10*3/uL — ABNORMAL HIGH (ref 0.1–1.0)
Monocytes Relative: 16 %
Neutro Abs: 5 10*3/uL (ref 1.7–7.7)
Neutrophils Relative %: 71 %
Platelets: 17 10*3/uL — CL (ref 150–400)
RBC: 3.17 MIL/uL — ABNORMAL LOW (ref 3.87–5.11)
RDW: 15.8 % — ABNORMAL HIGH (ref 11.5–15.5)
WBC: 7 10*3/uL (ref 4.0–10.5)
nRBC: 0 % (ref 0.0–0.2)

## 2022-01-17 LAB — COMPREHENSIVE METABOLIC PANEL
ALT: 14 U/L (ref 0–44)
AST: 18 U/L (ref 15–41)
Albumin: 3.1 g/dL — ABNORMAL LOW (ref 3.5–5.0)
Alkaline Phosphatase: 123 U/L (ref 38–126)
Anion gap: 12 (ref 5–15)
BUN: 21 mg/dL (ref 8–23)
CO2: 16 mmol/L — ABNORMAL LOW (ref 22–32)
Calcium: 8.9 mg/dL (ref 8.9–10.3)
Chloride: 104 mmol/L (ref 98–111)
Creatinine, Ser: 1.17 mg/dL — ABNORMAL HIGH (ref 0.44–1.00)
GFR, Estimated: 47 mL/min — ABNORMAL LOW (ref >60)
Glucose, Bld: 94 mg/dL (ref 70–99)
Potassium: 4.1 mmol/L (ref 3.5–5.1)
Sodium: 132 mmol/L — ABNORMAL LOW (ref 135–145)
Total Bilirubin: 0.3 mg/dL (ref 0.3–1.2)
Total Protein: 6 g/dL — ABNORMAL LOW (ref 6.5–8.1)

## 2022-01-17 LAB — RESP PANEL BY RT-PCR (FLU A&B, COVID) ARPGX2
Influenza A by PCR: NEGATIVE
Influenza B by PCR: NEGATIVE
SARS Coronavirus 2 by RT PCR: NEGATIVE

## 2022-01-17 LAB — TYPE AND SCREEN
ABO/RH(D): O POS
Antibody Screen: NEGATIVE

## 2022-01-17 MED ORDER — LORAZEPAM 1 MG PO TABS: 1.0000 mg | ORAL_TABLET | ORAL | Status: AC | PRN

## 2022-01-17 MED ORDER — THIAMINE HCL 100 MG/ML IJ SOLN
100.0000 mg | Freq: Every day | INTRAMUSCULAR | Status: DC
  Administered 2022-01-18: 100 mg via INTRAVENOUS
  Filled 2022-01-17: qty 2

## 2022-01-17 MED ORDER — SODIUM CHLORIDE 0.9 % IV BOLUS
500.0000 mL | Freq: Once | INTRAVENOUS | Status: AC
  Administered 2022-01-17: 500 mL via INTRAVENOUS

## 2022-01-17 MED ORDER — SODIUM CHLORIDE 0.9 % IV SOLN
10.0000 mL/h | Freq: Once | INTRAVENOUS | Status: AC
  Administered 2022-01-17: 10 mL/h via INTRAVENOUS

## 2022-01-17 MED ORDER — LORAZEPAM 2 MG/ML IJ SOLN: 1.0000 mg | INTRAMUSCULAR | Status: AC | PRN

## 2022-01-17 MED ORDER — FOLIC ACID 1 MG PO TABS
1.0000 mg | ORAL_TABLET | Freq: Every day | ORAL | Status: DC
  Administered 2022-01-19 – 2022-01-25 (×7): 1 mg via ORAL
  Filled 2022-01-17 (×9): qty 1

## 2022-01-17 MED ORDER — SODIUM CHLORIDE 0.9 % IV BOLUS
1000.0000 mL | Freq: Once | INTRAVENOUS | Status: AC
  Administered 2022-01-17: 1000 mL via INTRAVENOUS

## 2022-01-17 MED ORDER — CIPROFLOXACIN IN D5W 400 MG/200ML IV SOLN
400.0000 mg | Freq: Once | INTRAVENOUS | Status: AC
Start: 2022-01-17 — End: 2022-01-18
  Administered 2022-01-17: 400 mg via INTRAVENOUS
  Filled 2022-01-17: qty 200

## 2022-01-17 MED ORDER — METRONIDAZOLE 500 MG/100ML IV SOLN
500.0000 mg | Freq: Once | INTRAVENOUS | Status: AC
Start: 2022-01-17 — End: 2022-01-18
  Administered 2022-01-17: 500 mg via INTRAVENOUS
  Filled 2022-01-17: qty 100

## 2022-01-17 MED ORDER — THIAMINE HCL 100 MG PO TABS
100.0000 mg | ORAL_TABLET | Freq: Every day | ORAL | Status: DC
  Administered 2022-01-19: 100 mg via ORAL
  Filled 2022-01-17: qty 1

## 2022-01-17 MED ORDER — LOPERAMIDE HCL 2 MG PO CAPS
4.0000 mg | ORAL_CAPSULE | Freq: Once | ORAL | Status: AC
Start: 2022-01-17 — End: 2022-01-17
  Administered 2022-01-17: 4 mg via ORAL
  Filled 2022-01-17: qty 2

## 2022-01-17 MED ORDER — LORAZEPAM 2 MG/ML IJ SOLN
0.5000 mg | Freq: Once | INTRAMUSCULAR | Status: AC
  Administered 2022-01-17: 0.5 mg via INTRAVENOUS
  Filled 2022-01-17: qty 1

## 2022-01-17 MED ORDER — ADULT MULTIVITAMIN W/MINERALS CH
1.0000 | ORAL_TABLET | Freq: Every day | ORAL | Status: DC
  Administered 2022-01-19 – 2022-01-25 (×7): 1 via ORAL
  Filled 2022-01-17 (×8): qty 1

## 2022-01-17 NOTE — Assessment & Plan Note (Addendum)
Continue to hold metoprolol and Spironolactone for now since blood pressures have been soft Restart metoprolol succinate at lower dose at time of d/c

## 2022-01-17 NOTE — ED Notes (Signed)
Lab called to inform that platelet unit had to be ordered and courriered and will be available sometime after 1730

## 2022-01-17 NOTE — ED Provider Notes (Addendum)
[facility] EMERGENCY DEPARTMENT Provider Note   CSN: [id number] Arrival date & time: 01/17/22  1109     History  Chief Complaint  Patient presents with   Diarrhea    [name] is a 81 y.o. female.  Patient presents with complaint of diarrhea.  She states has been having diarrhea for 4 to 5 days about 4 episodes a day.  Denies fevers or cough.  Denies vomiting.  She has a caregiver that presents with her and that takes care of her daily.  Her other family members otherwise live in [place].      Home Medications Prior to Admission medications   Medication Sig Start Date End Date Taking? Authorizing Provider  Biotin 2.5 MG TABS Take 1 tablet by mouth daily.    Yes [provider]  Calcium Carb-Cholecalciferol (CALCIUM + D3 PO) Take 1 tablet by mouth daily with breakfast.   Yes [provider]  cycloSPORINE (RESTASIS) 0.05 % ophthalmic emulsion Place 1 drop into both eyes 2 (two) times daily.    Yes [provider]  escitalopram (LEXAPRO) 20 MG tablet Take 1 tablet (20 mg total) by mouth in the morning. 05/25/21  Yes Lilland, Alana, DO  folic acid (FOLVITE) 1 MG tablet Take 1 tablet (1 mg total) by mouth daily. 05/25/21  Yes Lilland, Alana, DO  KEPPRA 500 MG tablet Take 1 tablet by mouth daily. 12/12/21  Yes [provider]  metoprolol succinate (TOPROL-XL) 100 MG 24 hr tablet TAKE ONE TABLET EVERY MORNING. 12/23/21  Yes Imogene Burn, PA-C  Multiple Vitamin (MULTIVITAMIN WITH MINERALS) TABS tablet Take 1 tablet by mouth daily. 11/08/21  Yes Mariel Aloe, MD  potassium citrate (UROCIT-K) 10 MEQ (1080 MG) SR tablet Take 10 mEq by mouth daily.  03/22/19  Yes [provider]  spironolactone (ALDACTONE) 25 MG tablet Take 1 tablet (25 mg total) by mouth daily. 09/24/21  Yes Imogene Burn, PA-C  thiamine 100 MG tablet Take 100 mg by mouth daily.    Yes [provider]      Allergies    Clindamycin, Clindamycin/lincomycin,  Doxycycline, and Doxycycline hyclate    Review of Systems   Review of Systems  Constitutional:  Negative for fever.  HENT:  Negative for ear pain.   Eyes:  Negative for pain.  Respiratory:  Negative for cough.   Cardiovascular:  Negative for chest pain.  Gastrointestinal:  Negative for abdominal pain.  Genitourinary:  Negative for flank pain.  Musculoskeletal:  Negative for back pain.  Skin:  Negative for rash.  Neurological:  Negative for headaches.   Physical Exam Updated Vital Signs BP 101/64    Pulse 70    Temp 97.6 F (36.4 C) (Oral)    Resp 16    SpO2 98%  Physical Exam Constitutional:      General: She is not in acute distress.    Appearance: Normal appearance.  HENT:     Head: Normocephalic.     Nose: Nose normal.  Eyes:     Extraocular Movements: Extraocular movements intact.  Cardiovascular:     Rate and Rhythm: Normal rate.  Pulmonary:     Effort: Pulmonary effort is normal.  Abdominal:     Comments: Mild diffuse abdominal tenderness on exam.  No guarding or rebound noted.  Musculoskeletal:        General: Normal range of motion.     Cervical back: Normal range of motion.  Neurological:     General: No  focal deficit present.     Mental Status: She is alert. Mental status is at baseline.    ED Results / Procedures / Treatments   Labs (all labs ordered are listed, but only abnormal results are displayed) Labs Reviewed  COMPREHENSIVE METABOLIC PANEL - Abnormal; Notable for the following components:      Result Value   Sodium 132 (*)    CO2 16 (*)    Creatinine, Ser 1.17 (*)    Total Protein 6.0 (*)    Albumin 3.1 (*)    GFR, Estimated 47 (*)    All other components within normal limits  CBC WITH DIFFERENTIAL/PLATELET - Abnormal; Notable for the following components:   RBC 3.17 (*)    Hemoglobin 9.7 (*)    HCT 30.1 (*)    RDW 15.8 (*)    Platelets 17 (*)    Monocytes Absolute 1.1 (*)    All other components within normal limits  CBC WITH  DIFFERENTIAL/PLATELET  TYPE AND SCREEN  PREPARE PLATELET PHERESIS    EKG None  Radiology DG Chest Port 1 View  Result Date: 01/17/2022 CLINICAL DATA:  cough EXAM: PORTABLE CHEST 1 VIEW COMPARISON:  Chest radiograph 11/05/2021. FINDINGS: No consolidation. Small pulmonary nodules better characterized on recent CT chest. Postsurgical change in the upper right chest. No visible pleural effusions or pneumothorax. Chronic mild elevation left hemidiaphragm. Remote left rib fractures. Osteopenia. Polyarticular degenerative change. Lower thoracic dextrocurvature. IMPRESSION: No evidence of acute cardiopulmonary disease. Electronically Signed   By: Margaretha Sheffield M.D.   On: 01/17/2022 12:24    Procedures Procedures    Medications Ordered in ED Medications  0.9 %  sodium chloride infusion (has no administration in time range)  sodium chloride 0.9 % bolus 500 mL (0 mLs Intravenous Stopped 01/17/22 1416)  loperamide (IMODIUM) capsule 4 mg (4 mg Oral Given 01/17/22 1252)    ED Course/ Medical Decision Making/ A&P                           Medical Decision Making Amount and/or Complexity of Data Reviewed Labs: ordered. Radiology: ordered.  Risk Prescription drug management.   History obtained from caregiver and daughter.  They state that she is a chronic alcoholic and drinks every day.  Patient is cantankerous and otherwise combative to every step of her evaluation.  Review of records show an outpatient visit November 19, 2021 for nonpressure ulcer of the calf.  Labs show critical low platelet levels at 17,000.  Risk and benefits of transfusion discussed with patient who initially declined.  Case discussed with patient's daughter over the phone and ultimately convince the patient to accept a transfusion.  Addendum: Patient and caregiver notify me to a wound on her left upper extremity has been there for at least 2 weeks.  She had a skin tear from a presumed fall that have been changing  wet-to-dry dressings.  Due to her low platelets it appears to be bleeding more actively now.  Upon evaluation no evidence of cellulitis or secondary infection Xeroform gauze placed with pressure dressing and Coban wrap which the patient tolerated somewhat.  Advised dressing change daily and close follow-up with her doctor within the week.  Advising immediate return for worsening symptoms otherwise close follow-up recommended with the primary care doctor this week.  Patient pending blood transfusion and CT scan to be followed by oncoming provider.    Final Clinical Impression(s) / ED Diagnoses Final diagnoses:  Diarrhea,  unspecified type  Thrombocytopenia [facility])    Rx / DC Orders ED Discharge Orders     None         Luna Fuse, MD 01/17/22 1449    Luna Fuse, MD 01/17/22 [id number]

## 2022-01-17 NOTE — ED Triage Notes (Signed)
Diarrhea for over a week

## 2022-01-17 NOTE — Assessment & Plan Note (Addendum)
Patient is chronically thrombocytopenic, seems like baseline is in 70-80s Admitted with platelets of 17  LDH normal, DIC panel neg, no schistocytes, B12 level 2256 She received a unit of platelets in the ED with transient improvement of PLTs to 39, but back down to 18  Suspect this is related to infectious process as well as alcohol use Platelets down to 15 on 2/21 and patient noted to be oozing around midline IV Transfusing a unit of platelets on 2/21 (2 total for the admission) Platelets slowly improving--71K on  Day of d/c

## 2022-01-17 NOTE — Assessment & Plan Note (Addendum)
Per caregiver, patient drinks approximately 2 bottles of wine per day Last drink was prior to coming to ED on 2/17 CIWA protocol with ativan Fortunately, she has not required much Ativan. Continue thiamine supplementation  Continue folic acid supplementation Check thiamine level (182) and start on high dose supplementation initially

## 2022-01-17 NOTE — ED Notes (Signed)
[name]  [phone]  caregiver

## 2022-01-17 NOTE — ED Notes (Signed)
Wound left arm re-dressed by Dr Almyra Free with pressure bandage

## 2022-01-17 NOTE — Assessment & Plan Note (Addendum)
Having 5-6 BM daily for 10 days prior to admission 01/17/22 CT abd--wall thickening distal sigmoid and rectum Stool C diff antigen and toxin positive Started on oral vancomycin Continues to have loose bowel movements but continues to improve D/c home with vanco po x 5 more days to complete 10 day course

## 2022-01-17 NOTE — ED Notes (Signed)
Patient refuses to allow blood pressure cuff to stay on.  Patient states "that everything that touches me hurts".  Unable to obtain vital signs - Patient is conscious and alert

## 2022-01-17 NOTE — Assessment & Plan Note (Signed)
Continue Lexapro

## 2022-01-17 NOTE — Assessment & Plan Note (Deleted)
Albumin 2.2 Nutrition consult Liberalize diet Started on boost breeze 3 times daily

## 2022-01-17 NOTE — Assessment & Plan Note (Addendum)
Continue Keppra Seizure precautions No seizures this admission

## 2022-01-17 NOTE — ED Notes (Signed)
Skin tear to left upper arm,dressing in place on arrival

## 2022-01-17 NOTE — ED Notes (Signed)
Date and time results received: 01/17/22 1:53 PM    Test: PLATELETS Critical Value: 17  Name of Provider Notified: Dr. Almyra Free  Orders Received? Or Actions Taken?: see orders

## 2022-01-17 NOTE — ED Notes (Signed)
Patient transported to CT 

## 2022-01-18 ENCOUNTER — Inpatient Hospital Stay (HOSPITAL_COMMUNITY): Payer: Medicare Other

## 2022-01-18 DIAGNOSIS — F1020 Alcohol dependence, uncomplicated: Secondary | ICD-10-CM | POA: Diagnosis not present

## 2022-01-18 DIAGNOSIS — D696 Thrombocytopenia, unspecified: Secondary | ICD-10-CM

## 2022-01-18 DIAGNOSIS — K529 Noninfective gastroenteritis and colitis, unspecified: Secondary | ICD-10-CM

## 2022-01-18 DIAGNOSIS — R6520 Severe sepsis without septic shock: Secondary | ICD-10-CM

## 2022-01-18 DIAGNOSIS — I10 Essential (primary) hypertension: Secondary | ICD-10-CM

## 2022-01-18 DIAGNOSIS — R569 Unspecified convulsions: Secondary | ICD-10-CM

## 2022-01-18 DIAGNOSIS — F32A Depression, unspecified: Secondary | ICD-10-CM

## 2022-01-18 DIAGNOSIS — A4151 Sepsis due to Escherichia coli [E. coli]: Secondary | ICD-10-CM

## 2022-01-18 DIAGNOSIS — E441 Mild protein-calorie malnutrition: Secondary | ICD-10-CM

## 2022-01-18 LAB — COMPREHENSIVE METABOLIC PANEL
ALT: 10 U/L (ref 0–44)
AST: 13 U/L — ABNORMAL LOW (ref 15–41)
Albumin: 2.2 g/dL — ABNORMAL LOW (ref 3.5–5.0)
Alkaline Phosphatase: 87 U/L (ref 38–126)
Anion gap: 7 (ref 5–15)
BUN: 18 mg/dL (ref 8–23)
CO2: 19 mmol/L — ABNORMAL LOW (ref 22–32)
Calcium: 7.3 mg/dL — ABNORMAL LOW (ref 8.9–10.3)
Chloride: 108 mmol/L (ref 98–111)
Creatinine, Ser: 1.04 mg/dL — ABNORMAL HIGH (ref 0.44–1.00)
GFR, Estimated: 54 mL/min — ABNORMAL LOW (ref >60)
Glucose, Bld: 74 mg/dL (ref 70–99)
Potassium: 3.4 mmol/L — ABNORMAL LOW (ref 3.5–5.1)
Sodium: 134 mmol/L — ABNORMAL LOW (ref 135–145)
Total Bilirubin: 0.3 mg/dL (ref 0.3–1.2)
Total Protein: 4.4 g/dL — ABNORMAL LOW (ref 6.5–8.1)

## 2022-01-18 LAB — CBC WITH DIFFERENTIAL/PLATELET
Abs Immature Granulocytes: 0.02 10*3/uL (ref 0.00–0.07)
Basophils Absolute: 0 10*3/uL (ref 0.0–0.1)
Basophils Relative: 1 %
Eosinophils Absolute: 0 10*3/uL (ref 0.0–0.5)
Eosinophils Relative: 1 %
HCT: 24.8 % — ABNORMAL LOW (ref 36.0–46.0)
Hemoglobin: 8 g/dL — ABNORMAL LOW (ref 12.0–15.0)
Immature Granulocytes: 1 %
Lymphocytes Relative: 17 %
Lymphs Abs: 0.7 10*3/uL (ref 0.7–4.0)
MCH: 30.4 pg (ref 26.0–34.0)
MCHC: 32.3 g/dL (ref 30.0–36.0)
MCV: 94.3 fL (ref 80.0–100.0)
Monocytes Absolute: 0.6 10*3/uL (ref 0.1–1.0)
Monocytes Relative: 14 %
Neutro Abs: 2.8 10*3/uL (ref 1.7–7.7)
Neutrophils Relative %: 66 %
Platelets: 39 10*3/uL — ABNORMAL LOW (ref 150–400)
RBC: 2.63 MIL/uL — ABNORMAL LOW (ref 3.87–5.11)
RDW: 15.8 % — ABNORMAL HIGH (ref 11.5–15.5)
WBC: 4.2 10*3/uL (ref 4.0–10.5)
nRBC: 0 % (ref 0.0–0.2)

## 2022-01-18 LAB — PROCALCITONIN: Procalcitonin: 0.2 ng/mL

## 2022-01-18 LAB — MAGNESIUM: Magnesium: 1.7 mg/dL (ref 1.7–2.4)

## 2022-01-18 LAB — PROTIME-INR
INR: 1.2 (ref 0.8–1.2)
Prothrombin Time: 15 seconds (ref 11.4–15.2)

## 2022-01-18 LAB — TSH: TSH: 4.578 u[IU]/mL — ABNORMAL HIGH (ref 0.350–4.500)

## 2022-01-18 LAB — LACTIC ACID, PLASMA
Lactic Acid, Venous: 0.9 mmol/L (ref 0.5–1.9)
Lactic Acid, Venous: 0.6 mmol/L (ref 0.5–1.9)

## 2022-01-18 LAB — APTT: aPTT: 42 seconds — ABNORMAL HIGH (ref 24–36)

## 2022-01-18 MED ORDER — ESCITALOPRAM OXALATE 10 MG PO TABS
20.0000 mg | ORAL_TABLET | Freq: Every morning | ORAL | Status: DC
Start: 2022-01-18 — End: 2022-01-25
  Administered 2022-01-19 – 2022-01-25 (×7): 20 mg via ORAL
  Filled 2022-01-18 (×8): qty 2

## 2022-01-18 MED ORDER — THIAMINE HCL 100 MG PO TABS: 100.0000 mg | ORAL_TABLET | Freq: Every day | ORAL | Status: DC

## 2022-01-18 MED ORDER — FOLIC ACID 1 MG PO TABS
1.0000 mg | ORAL_TABLET | Freq: Every day | ORAL | Status: DC
Start: 2022-01-18 — End: 2022-01-18

## 2022-01-18 MED ORDER — OXYCODONE HCL 5 MG PO TABS
5.0000 mg | ORAL_TABLET | ORAL | Status: DC | PRN
  Administered 2022-01-24: 5 mg via ORAL
  Filled 2022-01-18: qty 1

## 2022-01-18 MED ORDER — ACETAMINOPHEN 650 MG RE SUPP: 650.0000 mg | Freq: Four times a day (QID) | RECTAL | Status: DC | PRN

## 2022-01-18 MED ORDER — LEVETIRACETAM 500 MG PO TABS
500.0000 mg | ORAL_TABLET | Freq: Every day | ORAL | Status: DC
  Administered 2022-01-18 – 2022-01-25 (×8): 500 mg via ORAL
  Filled 2022-01-18 (×8): qty 1

## 2022-01-18 MED ORDER — CIPROFLOXACIN IN D5W 400 MG/200ML IV SOLN
400.0000 mg | Freq: Two times a day (BID) | INTRAVENOUS | Status: DC
  Administered 2022-01-18 (×2): 400 mg via INTRAVENOUS
  Filled 2022-01-18 (×3): qty 200

## 2022-01-18 MED ORDER — ACETAMINOPHEN 325 MG PO TABS: 650.0000 mg | ORAL_TABLET | Freq: Four times a day (QID) | ORAL | Status: DC | PRN

## 2022-01-18 MED ORDER — METRONIDAZOLE 500 MG/100ML IV SOLN
500.0000 mg | Freq: Two times a day (BID) | INTRAVENOUS | Status: DC
  Administered 2022-01-18 – 2022-01-19 (×4): 500 mg via INTRAVENOUS
  Filled 2022-01-18 (×5): qty 100

## 2022-01-18 MED ORDER — ONDANSETRON HCL 4 MG PO TABS: 4.0000 mg | ORAL_TABLET | Freq: Four times a day (QID) | ORAL | Status: DC | PRN

## 2022-01-18 MED ORDER — ONDANSETRON HCL 4 MG/2ML IJ SOLN
4.0000 mg | Freq: Four times a day (QID) | INTRAMUSCULAR | Status: DC | PRN
Start: 2022-01-18 — End: 2022-01-25

## 2022-01-18 MED ORDER — SODIUM CHLORIDE 0.9 % IV SOLN: INTRAVENOUS | Status: DC

## 2022-01-18 MED ORDER — POTASSIUM CHLORIDE 10 MEQ/100ML IV SOLN
10.0000 meq | INTRAVENOUS | Status: AC
  Administered 2022-01-18 (×2): 10 meq via INTRAVENOUS
  Filled 2022-01-18 (×2): qty 100

## 2022-01-18 MED ORDER — CHLORHEXIDINE GLUCONATE CLOTH 2 % EX PADS
6.0000 | MEDICATED_PAD | Freq: Every day | CUTANEOUS | Status: DC
  Administered 2022-01-18 – 2022-01-25 (×9): 6 via TOPICAL

## 2022-01-18 NOTE — Assessment & Plan Note (Addendum)
On admission: Heart rate 101, respiratory rate 30, systolic blood pressure less than 90, platelets 17 secondary to C diff colitis Sepsis order set utilized Procalcitonin 0.20 Hemodynamics stabilizing with IV fluids Continue to monitor Sepsis physiology resolved

## 2022-01-18 NOTE — ED Notes (Signed)
Swelling noted to L arm/hand. Bracelet and watch removed.

## 2022-01-18 NOTE — H&P (Signed)
History and Physical    Patient: [name] EHU:[id number] DOB: 06-05-1941 DOA: 01/17/2022 DOS: the patient was seen and examined on 01/18/2022 PCP: Asencion Noble, MD  Patient coming from: Home  Chief Complaint:  Chief Complaint  Patient presents with   Diarrhea    HPI: [name] is a 81 y.o. female with medical history significant of with history of alcohol dependence, alcoholic liver disease, lung cancer, degenerative disc disease, depression, GERD, seizure, subarachnoid bleed, chronic diarrhea, and more presents ED with chief complaint of diarrhea.  Unfortunately during her time in the ED she became agitated and was given half milligram of Ativan.  Patient is altered as a result of the Ativan, not able to answer questions.  She wakes up briefly, if negative tone mumbling's, and then goes back to sleep.  Chart review reveals that she had come in for chief complaint of diarrhea.  She has had diarrhea for about 5 days, 4 episodes a day.  At that time she was denying fever and cough.  She also denied vomiting.  Caregiver and daughter reported that patient was an alcoholic and drinks every day.  Labs are done that showed a critical platelet of 17,000.  Provider offered admission, patient refused.  ED provider was able to convince patient to stay for a unit of platelets platelets had to be ordered from [facility], the extended wait made patient very agitated.  This when she was given the Ativan.  Now she is not able to provide any meaningful history.  Patient apparently has a history of everyday drinking.  Review of Systems: unable to review all systems due to the inability of the patient to answer questions. Past Medical History:  Diagnosis Date   Abdominal wall hernia, periumbilical, fat-containing 05/19/2021   CT AP 05/18/21: Fat containing periumbilical anterior abdominal wall hernia.  Second fat and bowel containing infraumbilical anterior abdominal wall hernia. No CT evidence of  bowel incarceration.   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   Depression    GERD (gastroesophageal reflux disease)    Kidney stones    Liver disease    Macrocytic anemia 05/19/2021   Mitral valve regurgitation, moderate 05/22/2021   Multiple closed fractures of ribs of left side 05/18/2021   Osteoporosis    Osteoporosis    Treated with denosumab   Physical deconditioning    Recurrent falls    Recurrent kidney stones    Calcium oxalate monohydrate on stone analysis 05/2020 at [facility] urology   Seizure [facility])    started on Keppra after [facility]   Subarachnoid bleed ([place])    Subarachnoid hemorrhage ([place]) 12/19/2019   Past Surgical History:  Procedure Laterality Date   ABDOMINAL HYSTERECTOMY     vaginal   ABDOMINAL HYSTERECTOMY     Vaginal   APPENDECTOMY     CARPAL TUNNEL RELEASE  01/21/2012   Procedure: CARPAL  TUNNEL RELEASE;  Surgeon: Wynonia Sours, MD;  Location: [place];  Service: Orthopedics;  Laterality: Right;   CARPAL TUNNEL RELEASE Right 01/02/2012   Dr Fredna Dow ([place])   COLON RESECTION  2004   perf bowel after colonoscopy   CYSTOSCOPY W/ RETROGRADES Bilateral 02/28/2018   Procedure: CYSTOSCOPY WITH BILATERAL RETROGRADE PYELOGRAM;BILATERAL URETERAL STENT PLACEMENT;  Surgeon: Cleon Gustin, MD;  Location: [facility];  Service: Urology;  Laterality: Bilateral;   CYSTOSCOPY W/ URETERAL STENT PLACEMENT  2010    lt-lazer stone   CYSTOSCOPY W/ URETERAL STENT PLACEMENT Bilateral 02/29/2020   Procedure: CYSTOSCOPY WITH BILATERAL RETROGRADE PYELOGRAM;BILATERAL URETERAL STENT PLACEMENT;  Surgeon: Cleon Gustin, MD;  Location: [facility];  Service: Urology;  Laterality: Bilateral;   CYSTOSCOPY WITH STENT PLACEMENT Bilateral 09/08/2014   Procedure: CYSTOSCOPY WITH STENT PLACEMENT;  Surgeon: Malka So, MD;  Location: [facility];  Service: Urology;  Laterality: Bilateral;   SMALL INTESTINE SURGERY  2004   Iliectomy   THORACOTOMY  2007   vatz-rt upper lobe   THORACOTOMY  12/01/2005   VATS Right Upper Lung Lobe - Dr Arlyce Dice ([facility])   TONSILLECTOMY     TONSILLECTOMY     Social History:  reports that she quit smoking about 25 years ago. Her smoking use included cigarettes. She has never used smokeless tobacco. She reports current alcohol use. She reports that she does not use drugs.  Allergies  Allergen Reactions   Clindamycin Rash   Clindamycin/Lincomycin Rash   Doxycycline Rash   Doxycycline Hyclate Rash    Family History  Problem Relation Age of Onset   Other Mother        "old age" - died at age 67   Lung cancer Father     Prior to Admission medications   Medication Sig Start Date End Date Taking? Authorizing Provider  Biotin 2.5 MG TABS Take 1 tablet by mouth daily.    Yes [provider]  Calcium Carb-Cholecalciferol (CALCIUM + D3 PO) Take 1 tablet by mouth daily with breakfast.   Yes [provider]  cycloSPORINE (RESTASIS) 0.05 % ophthalmic emulsion Place 1 drop into both eyes 2 (two) times daily.    Yes [provider]  escitalopram (LEXAPRO) 20 MG tablet Take 1 tablet (20 mg total) by mouth in the morning. 05/25/21  Yes Lilland, Alana, DO  folic acid (FOLVITE) 1 MG tablet Take 1 tablet (1 mg total) by mouth daily. 05/25/21  Yes Lilland, Alana, DO  KEPPRA 500 MG tablet Take 1 tablet by mouth daily. 12/12/21  Yes [provider]  metoprolol succinate  (TOPROL-XL) 100 MG 24 hr tablet TAKE ONE TABLET EVERY MORNING. 12/23/21  Yes Imogene Burn, PA-C  Multiple Vitamin (MULTIVITAMIN WITH MINERALS) TABS tablet Take 1 tablet by mouth daily. 11/08/21  Yes Mariel Aloe, MD  potassium citrate (UROCIT-K) 10 MEQ (1080 MG) SR tablet Take 10 mEq by mouth daily.  03/22/19  Yes [provider]  spironolactone (ALDACTONE) 25 MG tablet Take 1 tablet (25 mg total) by mouth daily. 09/24/21  Yes Imogene Burn, PA-C  thiamine 100 MG tablet Take 100 mg by mouth daily.    Yes [provider]    Physical Exam: Vitals:   01/18/22 0230 01/18/22 0245 01/18/22 0300 01/18/22 0315  BP: (!) 78/51 (!) 86/57 (!) 91/59 (!) 70/53  Pulse:  72 73 71  Resp: 14 15 16 14   Temp:      TempSrc:  SpO2:  100% 100% 100%   1.  General: Patient lying supine in bed,  no acute distress   2. Psychiatric: Patient is somnolent, not answering questions, resisting physical exam by pushing hands away from them trying to auscultate, or palpate abdomen   3. Neurologic:  face is symmetric, moves all 4 extremities voluntarily, altered from baseline most likely secondary to Ativan.  Patient is very somnolent   4. HEENMT:  Head is atraumatic, normocephalic, pupils reactive to light, neck is cachectic, trachea is midline, mucous membranes are moist   5. Respiratory : Lungs are clear to auscultation bilaterally without wheezing, rhonchi, rales, no cyanosis, no increase in work of breathing or accessory muscle use   6. Cardiovascular : Heart rate normal, rhythm is regular, no murmurs, rubs or gallops, no peripheral edema, peripheral pulses palpated   7. Gastrointestinal:  Abdomen is soft, nondistended, nontender to palpation bowel sounds active, no masses or organomegaly palpated   8. Skin:  Bilateral lower extremity bruising   9.Musculoskeletal:  No acute deformities or trauma, no asymmetry in tone, no peripheral edema, peripheral pulses palpated, no  tenderness to palpation in the extremities   Data Reviewed: In the ED  Temp 97.6, heart rate 68-1 01, respiratory rate 12-30, blood pressure as low as 75/59-133/60, patient initially maintaining oxygen saturations on room air, after Ativan patient requiring 2 L nasal cannula to maintain oxygen saturations No leukocytosis with white blood cell count of 7.0, hemoglobin 9.7, platelets 17, patient was given 1 unit of platelets and now platelets 39 Chemistry panel reveals an albumin of 3.1 initially, repeat is 2.2 likely from poor p.o. intake Patient was typed and screened CT abdomen pelvis shows wall thickening in the distal sigmoid colon and rectum distribution and appearance favoring infectious colitis which is likely the etiology behind her diarrhea.  She also had multiple ventral hernias, or chronic findings  Chest x-ray shows no cardiopulmonary disease Patient started on Cipro and Flagyl Patient was given a dose of Imodium Patient was given a dose of Ativan and 1.5 L normal saline bolus Initially patient refusing admission, after agitation being given Ativan patient is no longer safe to discharge home in her current altered mental state   Assessment and Plan: * Colitis- (present on admission) Continue Cipro and Flagyl GI pathogens panel Continue IV hydration Continue to monitor electrolytes and replete as indicated  HTN (hypertension)- (present on admission) Holding metoprolol and Spironolactone in setting of soft Bps as low as SBP 75    Protein-calorie malnutrition, mild ([place])- (present on admission) Albumin 3.1 Encourage nutrient dense food choices If patient is not eating adequately - will add protein shakes between meals  Sepsis ([place])- (present on admission) Heart rate 101, respiratory rate 30, systolic blood pressure less than 90, platelets 17 Most likely secondary to colitis Sepsis order set utilized Procalcitonin pending Continue IV fluid hydration Continue Cipro and  Flagyl Continue to monitor  Seizures ([place]) Continue Keppra Seizure precautions   Thrombocytopenia ([place])- (present on admission) Plts 17 1 unit platelets given in the ED Platelets improved to 39 Most likely related to alcohol/liver disease   Alcohol dependence ([place])- (present on admission) CIWA protocol Continue thiamine supplementation  Continue folic acid supplementation  Depression- (present on admission) Continue Lexapro       Advance Care Planning:   Code Status: Full Code   Consults: None at this time-consider heme-onc consult  Family Communication: No family at bedside  Severity of Illness: The appropriate patient status for this patient  is INPATIENT. Inpatient status is judged to be reasonable and necessary in order to provide the required intensity of service to ensure the patient's safety. The patient's presenting symptoms, physical exam findings, and initial radiographic and laboratory data in the context of their chronic comorbidities is felt to place them at high risk for further clinical deterioration. Furthermore, it is not anticipated that the patient will be medically stable for discharge from the hospital within 2 midnights of admission.   * I certify that at the point of admission it is my clinical judgment that the patient will require inpatient hospital care spanning beyond 2 midnights from the point of admission due to high intensity of service, high risk for further deterioration and high frequency of surveillance required.*  Author: Rolla Plate, DO 01/18/2022 3:58 AM  For on call review [email or web address].

## 2022-01-18 NOTE — ED Notes (Signed)
Pt incontinent of stool. Brief changed, peri care performed and barrier cream placed.

## 2022-01-18 NOTE — Progress Notes (Addendum)
Patient admitted to the hospital earlier this morning by Dr. Clearence Ped  Patient seen and examined. She wakes up to voice. Appears agitated by questions. Noted to have swelling in left arm. No significant swelling in R arm or LE b/l. Venous dopplers of LUE neg for DVT  Patient admitted to the hospital with persistent diarrhea. Caregiver Ms. Broadnax reports that she has been having 5-6 tan colored stools for the last 10 days. Poor po intake. Nausea but no vomiting. Increasing weakness. Feeling sore all over. Found to have colitis on CT imaging. Started on cipro flagyl. Stool studies ordered.  She has a history of alcoholism. She drinks 2 bottles of wine per day. Last drink was on morning of 2/17. Currently CIWA protocol  Significantly thrombocytopenic. Suspect this could be related to infectious process and bone marrow toxicity related to alcohol. She was transfused 1 unit platelet in ED.  Hgb currently 8, which is near baseline of 8-9. Continue to follow.  Updated patient daughter over the phone. She confirmed that patient is DNR  [employer]

## 2022-01-19 DIAGNOSIS — F1020 Alcohol dependence, uncomplicated: Secondary | ICD-10-CM | POA: Diagnosis not present

## 2022-01-19 DIAGNOSIS — R7989 Other specified abnormal findings of blood chemistry: Secondary | ICD-10-CM | POA: Diagnosis present

## 2022-01-19 DIAGNOSIS — A0472 Enterocolitis due to Clostridium difficile, not specified as recurrent: Secondary | ICD-10-CM

## 2022-01-19 DIAGNOSIS — K529 Noninfective gastroenteritis and colitis, unspecified: Secondary | ICD-10-CM | POA: Diagnosis not present

## 2022-01-19 LAB — PREPARE PLATELET PHERESIS: Unit division: 0

## 2022-01-19 LAB — COMPREHENSIVE METABOLIC PANEL
ALT: 12 U/L (ref 0–44)
AST: 16 U/L (ref 15–41)
Albumin: 2.2 g/dL — ABNORMAL LOW (ref 3.5–5.0)
Alkaline Phosphatase: 82 U/L (ref 38–126)
Anion gap: 6 (ref 5–15)
BUN: 11 mg/dL (ref 8–23)
CO2: 19 mmol/L — ABNORMAL LOW (ref 22–32)
Calcium: 7.5 mg/dL — ABNORMAL LOW (ref 8.9–10.3)
Chloride: 107 mmol/L (ref 98–111)
Creatinine, Ser: 0.92 mg/dL (ref 0.44–1.00)
GFR, Estimated: >60 mL/min (ref >60)
Glucose, Bld: 114 mg/dL — ABNORMAL HIGH (ref 70–99)
Potassium: 3.3 mmol/L — ABNORMAL LOW (ref 3.5–5.1)
Sodium: 132 mmol/L — ABNORMAL LOW (ref 135–145)
Total Bilirubin: 0.4 mg/dL (ref 0.3–1.2)
Total Protein: 4.5 g/dL — ABNORMAL LOW (ref 6.5–8.1)

## 2022-01-19 LAB — CBC
HCT: 27.7 % — ABNORMAL LOW (ref 36.0–46.0)
Hemoglobin: 8.6 g/dL — ABNORMAL LOW (ref 12.0–15.0)
MCH: 30.2 pg (ref 26.0–34.0)
MCHC: 31 g/dL (ref 30.0–36.0)
MCV: 97.2 fL (ref 80.0–100.0)
Platelets: 18 10*3/uL — CL (ref 150–400)
RBC: 2.85 MIL/uL — ABNORMAL LOW (ref 3.87–5.11)
RDW: 16.1 % — ABNORMAL HIGH (ref 11.5–15.5)
WBC: 4 10*3/uL (ref 4.0–10.5)
nRBC: 0 % (ref 0.0–0.2)

## 2022-01-19 LAB — BPAM PLATELET PHERESIS
Blood Product Expiration Date: 202302202359
ISSUE DATE / TIME: 202302172024
Unit Type and Rh: 9500

## 2022-01-19 LAB — C DIFFICILE QUICK SCREEN W PCR REFLEX
C Diff antigen: POSITIVE — AB
C Diff interpretation: DETECTED
C Diff toxin: POSITIVE — AB

## 2022-01-19 LAB — MAGNESIUM: Magnesium: 1.7 mg/dL (ref 1.7–2.4)

## 2022-01-19 LAB — PHOSPHORUS: Phosphorus: 2.3 mg/dL — ABNORMAL LOW (ref 2.5–4.6)

## 2022-01-19 MED ORDER — POTASSIUM CHLORIDE CRYS ER 20 MEQ PO TBCR
40.0000 meq | EXTENDED_RELEASE_TABLET | Freq: Once | ORAL | Status: AC
  Administered 2022-01-19: 40 meq via ORAL
  Filled 2022-01-19: qty 2

## 2022-01-19 MED ORDER — VANCOMYCIN HCL 125 MG PO CAPS
125.0000 mg | ORAL_CAPSULE | Freq: Four times a day (QID) | ORAL | Status: DC
  Administered 2022-01-19 – 2022-01-25 (×25): 125 mg via ORAL
  Filled 2022-01-19 (×36): qty 1

## 2022-01-19 MED ORDER — THIAMINE HCL 100 MG/ML IJ SOLN
500.0000 mg | Freq: Three times a day (TID) | INTRAVENOUS | Status: AC
  Administered 2022-01-19 – 2022-01-22 (×9): 500 mg via INTRAVENOUS
  Filled 2022-01-19 (×9): qty 5

## 2022-01-19 NOTE — Progress Notes (Signed)
Progress Note   Patient: [name]:[id number] DOB: 07-10-1941 DOA: 01/17/2022     2 DOS: the patient was seen and examined on 01/19/2022   Brief hospital course: 81 y/o female with history of alcohol dependence, HTN, admitted with C diff colitis. She has been started on oral vancomycin. Also noted to be severely thrombocytopenic, felt to be related to infectious process and ETOH. Monitoring for ETOH withdrawal symptoms  Assessment and Plan: * C. difficile colitis- (present on admission) Having 5-6 BM daily for 10 days prior to admission CT shows evidence of colitis, Stool C diff antigen and toxin positive Started on oral vancomycin Will continue IV flagyl for now until she demonstrates that she is consistently taking po meds  Elevated TSH- (present on admission) Unclear significance She does not appear to be on thyroid replacement as outpatient TSH currently 4.5, but this is down from 9.5 on 11/03/21 Would repeat in 4 weeks as outpatient  HTN (hypertension)- (present on admission) Continue to hold metoprolol and Spironolactone for now since blood pressures have been borderline    Protein-calorie malnutrition, mild ([place])- (present on admission) Albumin 2.2 Nutrition consult Liberalize diet  Sepsis ([place])- (present on admission) Heart rate 101, respiratory rate 30, systolic blood pressure less than 90, platelets 17 Most likely secondary to C diff colitis Sepsis order set utilized Procalcitonin 0.2 Hemodynamics stabilizing with IV fluids Continue to monitor  Seizures (HCC) Continue Keppra Seizure precautions   Thrombocytopenia ([place])- (present on admission) Patient is chronically thrombocytopenic, seems like baseline is in 70-80s Admitted with platelets of 17  No mention of schistocytes on smear review She received a unit of platelets in the ED with transient improvement of PLTs to 39, but back down to 18 today Suspect this is related to infectious process as  well as alcohol use Currently no signs of bleeding, hgb stable, will continue to monitor plts for now Transfuse for PLT <10 or <20 with evidence of bleeding Check b12 level    Alcohol dependence ([place])- (present on admission) Per caregiver, patient drinks approximately 2 bottles of wine per day Last drink was prior to coming to ED on 2/17 CIWA protocol with ativan Continue thiamine supplementation  Continue folic acid supplementation Check thiamine level and start on high dose supplementation  Depression- (present on admission) Continue Lexapro        Subjective: she is confused. Denies any abdominal pain. She does not think she had any stools overnight. She had some breakfast this morning  Physical Exam: Vitals:   01/19/22 0600 01/19/22 0900 01/19/22 1000 01/19/22 1100  BP: 119/75 110/69 (!) 115/53 109/71  Pulse:  (!) 35 (!) 54 69  Resp: 14 16 16 18   Temp:      TempSrc:      SpO2:  (!) 86% 100% (!) 85%   General exam: Alert, awake, no distress Respiratory system: Clear to auscultation. Respiratory effort normal. Cardiovascular system:RRR. No murmurs, rubs, gallops. Gastrointestinal system: Abdomen is nondistended, soft and nontender. No organomegaly or masses felt. Normal bowel sounds heard. Central nervous system: No focal neurological deficits. Extremities: No C/C/E, +pedal pulses Skin: bruising noted in upper and lower extremities Psychiatry: confused   Data Reviewed:  Reviewed stool studies, cbc and chemistry panel  Family Communication: updated patients daughter   Disposition: Status is: Inpatient Remains inpatient appropriate because: continue treatment of C diff and management of ETOH withdrawal          Planned Discharge Destination: Home     Time spent:  35 minutes  Author: Kathie Dike, MD 01/19/2022 11:53 AM  For on call review [email or web address].

## 2022-01-19 NOTE — Hospital Course (Addendum)
81 y/o female with history of alcohol dependence, HTN, admitted with C diff colitis. She has been started on oral vancomycin. Also noted to be severely thrombocytopenic, felt to be related to infectious process and ETOH. Monitoring for ETOH withdrawal symptoms.  The patient remains stable without signs of withdrawal.  Unfortunately, patient's platelets continue to trend down requiring 2 units platelets to be given this admission.  Fortunately, sepsis physiology resolved and her platelets ultimately stabilized and recovered.  She refused SNF and will d/c home with HHPT.   Pt normally has private pay care giver during day time at home.

## 2022-01-19 NOTE — Assessment & Plan Note (Addendum)
Euthyroid sick syndrome She does not appear to be on thyroid replacement as outpatient TSH currently 4.5, but this is down from 9.5 on 11/03/21 Normal Free T4 11/06/21 Would repeat in 4 weeks as outpatient

## 2022-01-19 NOTE — Progress Notes (Signed)
Notified Dr. Roderic Palau of critical platelet count of 18

## 2022-01-20 ENCOUNTER — Other Ambulatory Visit (HOSPITAL_COMMUNITY): Payer: Self-pay

## 2022-01-20 DIAGNOSIS — E43 Unspecified severe protein-calorie malnutrition: Secondary | ICD-10-CM | POA: Diagnosis present

## 2022-01-20 DIAGNOSIS — R7989 Other specified abnormal findings of blood chemistry: Secondary | ICD-10-CM | POA: Diagnosis not present

## 2022-01-20 DIAGNOSIS — K529 Noninfective gastroenteritis and colitis, unspecified: Secondary | ICD-10-CM | POA: Diagnosis not present

## 2022-01-20 DIAGNOSIS — F1020 Alcohol dependence, uncomplicated: Secondary | ICD-10-CM | POA: Diagnosis not present

## 2022-01-20 DIAGNOSIS — A0472 Enterocolitis due to Clostridium difficile, not specified as recurrent: Secondary | ICD-10-CM | POA: Diagnosis not present

## 2022-01-20 LAB — GASTROINTESTINAL PANEL BY PCR, STOOL (REPLACES STOOL CULTURE)

## 2022-01-20 LAB — CBC
HCT: 23.6 % — ABNORMAL LOW (ref 36.0–46.0)
Hemoglobin: 7.3 g/dL — ABNORMAL LOW (ref 12.0–15.0)
MCH: 28.7 pg (ref 26.0–34.0)
MCHC: 30.9 g/dL (ref 30.0–36.0)
MCV: 92.9 fL (ref 80.0–100.0)
Platelets: 18 10*3/uL — CL (ref 150–400)
RBC: 2.54 MIL/uL — ABNORMAL LOW (ref 3.87–5.11)
RDW: 16 % — ABNORMAL HIGH (ref 11.5–15.5)
WBC: 3.8 10*3/uL — ABNORMAL LOW (ref 4.0–10.5)
nRBC: 0 % (ref 0.0–0.2)

## 2022-01-20 LAB — RENAL FUNCTION PANEL
Albumin: 2.2 g/dL — ABNORMAL LOW (ref 3.5–5.0)
Anion gap: 5 (ref 5–15)
BUN: 9 mg/dL (ref 8–23)
CO2: 21 mmol/L — ABNORMAL LOW (ref 22–32)
Calcium: 7.9 mg/dL — ABNORMAL LOW (ref 8.9–10.3)
Chloride: 110 mmol/L (ref 98–111)
Creatinine, Ser: 1.12 mg/dL — ABNORMAL HIGH (ref 0.44–1.00)
GFR, Estimated: 50 mL/min — ABNORMAL LOW (ref >60)
Glucose, Bld: 87 mg/dL (ref 70–99)
Phosphorus: 2.1 mg/dL — ABNORMAL LOW (ref 2.5–4.6)
Potassium: 4 mmol/L (ref 3.5–5.1)
Sodium: 136 mmol/L (ref 135–145)

## 2022-01-20 LAB — FERRITIN: Ferritin: 100 ng/mL (ref 11–307)

## 2022-01-20 LAB — VITAMIN B12: Vitamin B-12: 2256 pg/mL — ABNORMAL HIGH (ref 180–914)

## 2022-01-20 LAB — MAGNESIUM: Magnesium: 1.7 mg/dL (ref 1.7–2.4)

## 2022-01-20 LAB — IRON AND TIBC
Iron: 29 ug/dL (ref 28–170)
Saturation Ratios: 21 % (ref 10.4–31.8)
TIBC: 137 ug/dL — ABNORMAL LOW (ref 250–450)
UIBC: 108 ug/dL

## 2022-01-20 MED ORDER — SODIUM PHOSPHATES 45 MMOLE/15ML IV SOLN: 15.0000 mmol | Freq: Once | INTRAVENOUS | Status: DC

## 2022-01-20 MED ORDER — POTASSIUM PHOSPHATES 15 MMOLE/5ML IV SOLN
15.0000 mmol | Freq: Once | INTRAVENOUS | Status: AC
  Administered 2022-01-20: 15 mmol via INTRAVENOUS
  Filled 2022-01-20: qty 5

## 2022-01-20 MED ORDER — NYSTATIN 100000 UNIT/GM EX CREA
TOPICAL_CREAM | Freq: Two times a day (BID) | CUTANEOUS | Status: DC
  Filled 2022-01-20: qty 30

## 2022-01-20 MED ORDER — BOOST / RESOURCE BREEZE PO LIQD CUSTOM
1.0000 | Freq: Three times a day (TID) | ORAL | Status: DC
  Administered 2022-01-20 – 2022-01-25 (×13): 1 via ORAL

## 2022-01-20 MED ORDER — GERHARDT'S BUTT CREAM
TOPICAL_CREAM | Freq: Two times a day (BID) | CUTANEOUS | Status: DC
  Filled 2022-01-20: qty 1

## 2022-01-20 NOTE — Progress Notes (Signed)
Progress Note   Patient: [name] IZT:[id number] DOB: May 09, 1941 DOA: 01/17/2022     3 DOS: the patient was seen and examined on 01/20/2022   Brief hospital course: 81 y/o female with history of alcohol dependence, HTN, admitted with C diff colitis. She has been started on oral vancomycin. Also noted to be severely thrombocytopenic, felt to be related to infectious process and ETOH. Monitoring for ETOH withdrawal symptoms  Assessment and Plan: * C. difficile colitis- (present on admission) Having 5-6 BM daily for 10 days prior to admission CT shows evidence of colitis, Stool C diff antigen and toxin positive Started on oral vancomycin Continues to have loose bowel movements  Protein-calorie malnutrition, severe- (present on admission) Albumin 2.2 Nutrition consult Liberalize diet Started on boost breeze 3 times daily  Elevated TSH- (present on admission) Unclear significance She does not appear to be on thyroid replacement as outpatient TSH currently 4.5, but this is down from 9.5 on 11/03/21 Would repeat in 4 weeks as outpatient  HTN (hypertension)- (present on admission) Continue to hold metoprolol and Spironolactone for now since blood pressures have been borderline    Sepsis ([place])- (present on admission) On admission: Heart rate 101, respiratory rate 30, systolic blood pressure less than 90, platelets 17 Most likely secondary to C diff colitis Sepsis order set utilized Procalcitonin 0.2 Hemodynamics stabilizing with IV fluids Continue to monitor  Seizures (HCC) Continue Keppra Seizure precautions   Thrombocytopenia ([place])- (present on admission) Patient is chronically thrombocytopenic, seems like baseline is in 70-80s Admitted with platelets of 17  No mention of schistocytes on smear review She received a unit of platelets in the ED with transient improvement of PLTs to 39, but back down to 18  Suspect this is related to infectious process as well as  alcohol use Currently no signs of bleeding, hgb stable, will continue to monitor plts for now Transfuse for PLT <10 or <20 with evidence of bleeding B12 level is elevated We will check LDH, DIC panel    Alcohol dependence ([place])- (present on admission) Per caregiver, patient drinks approximately 2 bottles of wine per day Last drink was prior to coming to ED on 2/17 CIWA protocol with ativan Fortunately, she has not required much Ativan. Continue thiamine supplementation  Continue folic acid supplementation Check thiamine level and start on high dose supplementation  Depression- (present on admission) Continue Lexapro        Subjective: Patient is confused, does not know how many bowel movements she has had.  Cannot really participate in meaningful history.  Physical Exam: Vitals:   01/19/22 2200 01/19/22 2257 01/20/22 0544 01/20/22 1259  BP: 110/62 109/72 107/85 108/64  Pulse: 96 83 77 (!) 56  Resp: 20 18 17 19   Temp:  98.1 F (36.7 C) 98.1 F (36.7 C) 98.3 F (36.8 C)  TempSrc:  Oral Oral Oral  SpO2: 100% 97% 98% (!) 84%  Weight:  38.5 kg    Height:  5' (1.524 m)     General exam: Alert, awake, no distress Respiratory system: Clear to auscultation. Respiratory effort normal. Cardiovascular system:RRR. No murmurs, rubs, gallops. Gastrointestinal system: Abdomen is nondistended, soft and nontender. No organomegaly or masses felt. Normal bowel sounds heard. Central nervous system: Alert and oriented. No focal neurological deficits. Extremities: She is developing some anasarca Skin: Bruising noted in upper and lower extremities Psychiatry: Confused at times   Data Reviewed:  Reviewed CBC and chemistry results  Family Communication: Updated patient's daughter, [name] over the phone  Disposition: Status is: Inpatient Remains inpatient appropriate because: Continue treatment of C. difficile and management of diarrhea.  Monitoring CBC for anemia and  thrombocytopenia.          Planned Discharge Destination: Home with Home Health     Time spent: 35 minutes  Author: Kathie Dike, MD 01/20/2022 8:47 PM  For on call review [email or web address].

## 2022-01-20 NOTE — TOC Initial Note (Signed)
Transition of Care [facility]) - Initial/Assessment Note    Patient Details  Name: [name] MRN: [id number] Date of Birth: 27-Nov-1941  Transition of Care [facility]) CM/SW Contact:    Ihor Gully, LCSW Phone Number: 01/20/2022, 2:12 PM  Clinical Narrative:                 Patient from home alone. Has caregiver. Independent in ADLs. Ambulates with a walker. Drives "a little." Admitted for C.diff. Consult for SA resources related to alcohol use.  Discussed consult with patient. Patient admits to drinking, maybe too much, but she is not trying to drink too much. Patient is not currently interested in SA resources related to reducing alcohol use.   Expected Discharge Plan: Home/Self Care Barriers to Discharge: Continued Medical Work up   Patient Goals and CMS Choice Patient states their goals for this hospitalization and ongoing recovery are:: return home      Expected Discharge Plan and Services Expected Discharge Plan: Home/Self Care       Living arrangements for the past 2 months: [place]                                      Prior Living Arrangements/Services Living arrangements for the past 2 months: Single Family Home Lives with:: Self Patient language and need for interpreter reviewed:: Yes        Need for Family Participation in Patient Care: Yes (Comment) Care giver support system in place?: Yes (comment)   Criminal Activity/Legal Involvement Pertinent to Current Situation/Hospitalization: No - Comment as needed  Activities of Daily Living      Permission Sought/Granted                  Emotional Assessment     Affect (typically observed): Appropriate Orientation: : Oriented to  Time, Oriented to Place, Oriented to Self Alcohol / Substance Use: Not Applicable Psych Involvement: No (comment)  Admission diagnosis:  Swelling [R60.9] Colitis [K52.9] Thrombocytopenia ([place]) [D69.6] Diarrhea, unspecified type [R19.7] Patient Active  Problem List   Diagnosis Date Noted   Elevated TSH 01/19/2022   C. 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[place])    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 05/19/2021   Cerebral atrophy ([place]) 05/19/2021   Generalized weakness 05/19/2021   Rhabdomyolysis 15/72/6203   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[place]) 05/19/2021   Atherosclerosis of aorta ([place]) 05/19/2021   Multiple skin tears 05/19/2021   Multiple Bruising sites 05/19/2021   Poor historian 05/19/2021   Acute renal injury ([place]) 05/19/2021   Transaminitis 05/19/2021   Bilirubinemia 05/19/2021   Hypoproteinemia ([place]) 05/19/2021   Elevated troponin 05/19/2021   Vitamin B12 deficiency 05/19/2021   Macrocytic anemia 05/19/2021   TSH elevation 05/19/2021   Pyuria 05/19/2021   Hematuria 05/19/2021   Dehydration 05/19/2021   Cerebral atrophy ([place]) 05/19/2021   Degenerative disc disease, cervical 05/19/2021   Bilateral Periorbital  ecchymosis 05/19/2021   Myocardial injury    Elevated CK    Fall 05/18/2021   Multiple closed fractures of ribs of left side 05/18/2021   Alteration consciousness 12/10/2020   Gait abnormality 12/10/2020   Sepsis ([place]) 02/29/2020   Acute lower UTI 02/29/2020   Unsteady gait 01/10/2020   Seizures ([place]) 01/10/2020   Osteoarthritis of left knee    Physical deconditioning 12/19/2019   Alcohol dependence ([place]) 12/19/2019   Thrombocytopenia ([place]) 12/19/2019    Subarachnoid hemorrhage ([place]) 12/19/2019   SAH (subarachnoid hemorrhage) ([place])    Transaminasemia    Subarachnoid bleed ([place]) 12/18/2019   Hypokalemia 12/18/2019   Depression    Hyperbilirubinemia    Hyponatremia    Macrocytic anemia    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[place])    Arthritis 05/10/2019   Diarrhea 10/13/2017   Recurrent kidney stones 10/31/2014   Left ureteral stone 09/08/2014   Right ureteral stone 09/08/2014   Infection of urinary tract 09/08/2014   Pain in joint, shoulder region 08/09/2012   Muscle weakness (generalized) 08/09/2012   Impingement syndrome of left shoulder 08/09/2012   PCP:  Asencion Noble, MD Pharmacy:   [facility] [place] [address] [place] [address] Phone: [phone] Fax: [phone]     Social Determinants of Health (SDOH) Interventions    Readmission Risk Interventions Readmission Risk Prevention Plan 11/06/2021  Transportation Screening Complete  Home Care Screening Complete  Medication Review (RN CM) Complete  Some recent data might be hidden

## 2022-01-20 NOTE — Assessment & Plan Note (Signed)
Albumin 2.2 Nutrition consult Liberalize diet Started on boost breeze 3 times daily

## 2022-01-20 NOTE — Progress Notes (Signed)
Initial Nutrition Assessment  DOCUMENTATION CODES:   Underweight, Severe malnutrition in context of chronic illness  INTERVENTION:   -Boost Breeze po TID, each supplement provides 250 kcal and 9 grams of protein  -MVI with minerals daily  NUTRITION DIAGNOSIS:   Severe Malnutrition related to chronic illness (ETOH) as evidenced by moderate fat depletion, severe fat depletion, moderate muscle depletion, severe muscle depletion, energy intake < or equal to 75% for > or equal to 1 month.  GOAL:   Patient will meet greater than or equal to 90% of their needs  MONITOR:   PO intake, Supplement acceptance, Labs, Weight trends, Skin, I & O's  REASON FOR ASSESSMENT:   Consult Assessment of nutrition requirement/status  ASSESSMENT:   81 y/o female with history of alcohol dependence, HTN, admitted with C diff colitis. She has been started on oral vancomycin. Also noted to be severely thrombocytopenic, felt to be related to infectious process and ETOH. Monitoring for ETOH withdrawal symptoms  Pt admitted with C-diff.  Reviewed I/O's: +500 ml x 24 hours and +2.7 L since admission  Spoke with pt at bedside, who was irritable at time of visit. She raised her voice at this RD because this RD did not see her water cup behind the computer. RD spent time with pt to de-escalate situation and assure her that this RD was here to care for her and questions being asked were to help better care for her.   Pt frequently answered questions shortly, responding "that's all I know. I'm tired of talking about this".  Pt did not each much breakfast this morning (breakfast tray was unattempted) as she reported she was not hungry. Lunch tray was delivered and RD offered to assist her with tray set up, but pt adamantly refused.   Pt reports she consumes 2 meals per day, Breakfast: eggs OR cereal AND Dinner: protein.   Pt unsure of UBW or if she has lost weight. Reviewed wt hx; pt has experienced a 5.6% wt loss  over the past 2 months, which is not significant for time frame.   Medications reviewed and include folic acid, keppra, potassium phosphate, and thiamine.   Labs reviewed.   NUTRITION - FOCUSED PHYSICAL EXAM:  Flowsheet Row Most Recent Value  Orbital Region Moderate depletion  Upper Arm Region Severe depletion  Thoracic and Lumbar Region Severe depletion  Buccal Region Moderate depletion  Temple Region Moderate depletion  Clavicle Bone Region Severe depletion  Clavicle and Acromion Bone Region Severe depletion  Scapular Bone Region Severe depletion  Dorsal Hand Moderate depletion  Patellar Region Severe depletion  Anterior Thigh Region Severe depletion  Posterior Calf Region Severe depletion  Edema (RD Assessment) None  Hair Reviewed  Eyes Reviewed  Mouth Reviewed  Skin Reviewed  Nails Reviewed       Diet Order:   Diet Order             Diet regular Room service appropriate? Yes; Fluid consistency: Thin  Diet effective now                   EDUCATION NEEDS:   Education needs have been addressed  Skin:  Skin Assessment: Skin Integrity Issues: Skin Integrity Issues:: Other (Comment) Other: skin tear to lt upper arm, MASD to bilateral groins  Last BM:  01/20/22  Height:   Ht Readings from Last 1 Encounters:  01/19/22 5' (1.524 m)    Weight:   Wt Readings from Last 1 Encounters:  01/19/22 38.5 kg  Ideal Body Weight:  45.5 kg  BMI:  Body mass index is 16.58 kg/m.  Estimated Nutritional Needs:   Kcal:  1350-1550  Protein:  65-80 grams  Fluid:  > 1.3 L    Loistine Chance, RD, LDN, [place] Registered Dietitian II Certified Diabetes Care and Education Specialist Please refer to [facility] for RD and/or RD on-call/weekend/after hours pager

## 2022-01-20 NOTE — TOC Benefit Eligibility Note (Signed)
Patient Teacher, English as a foreign language completed.    The patient is currently admitted and upon discharge could be taking Vancomycin 125 mg capsules.  The current 10 day co-pay is, $38.81.   The patient is insured through [place], [place] Patient Advocate Specialist [facility] Patient Advocate Team Direct Number: [phone]  Fax: [phone]

## 2022-01-20 NOTE — Progress Notes (Signed)
Date and time results received: 01/20/22 0723 (use smartphrase ".now" to insert current time)  Test: CBC  Critical Value: 18 platelet count Name of Provider Notified: Dr. Roderic Palau [phone]  Orders Received? Or Actions Taken?:  Awaiting new orders

## 2022-01-20 NOTE — Plan of Care (Signed)

## 2022-01-21 DIAGNOSIS — R197 Diarrhea, unspecified: Secondary | ICD-10-CM

## 2022-01-21 DIAGNOSIS — A0472 Enterocolitis due to Clostridium difficile, not specified as recurrent: Secondary | ICD-10-CM | POA: Diagnosis not present

## 2022-01-21 DIAGNOSIS — F1020 Alcohol dependence, uncomplicated: Secondary | ICD-10-CM | POA: Diagnosis not present

## 2022-01-21 DIAGNOSIS — D649 Anemia, unspecified: Secondary | ICD-10-CM

## 2022-01-21 DIAGNOSIS — R7989 Other specified abnormal findings of blood chemistry: Secondary | ICD-10-CM | POA: Diagnosis not present

## 2022-01-21 LAB — DIC (DISSEMINATED INTRAVASCULAR COAGULATION)PANEL
D-Dimer, Quant: 1.12 ug/mL-FEU — ABNORMAL HIGH (ref 0.00–0.50)
Fibrinogen: 356 mg/dL (ref 210–475)
INR: 1.2 (ref 0.8–1.2)
Platelets: 15 10*3/uL — CL (ref 150–400)
Prothrombin Time: 15.3 seconds — ABNORMAL HIGH (ref 11.4–15.2)
Smear Review: NONE SEEN
aPTT: 49 seconds — ABNORMAL HIGH (ref 24–36)

## 2022-01-21 LAB — CBC WITH DIFFERENTIAL/PLATELET
Abs Immature Granulocytes: 0.04 10*3/uL (ref 0.00–0.07)
Basophils Absolute: 0 10*3/uL (ref 0.0–0.1)
Basophils Relative: 1 %
Eosinophils Absolute: 0 10*3/uL (ref 0.0–0.5)
Eosinophils Relative: 1 %
HCT: 21.9 % — ABNORMAL LOW (ref 36.0–46.0)
Hemoglobin: 7 g/dL — ABNORMAL LOW (ref 12.0–15.0)
Immature Granulocytes: 1 %
Lymphocytes Relative: 25 %
Lymphs Abs: 0.8 10*3/uL (ref 0.7–4.0)
MCH: 30.2 pg (ref 26.0–34.0)
MCHC: 32 g/dL (ref 30.0–36.0)
MCV: 94.4 fL (ref 80.0–100.0)
Monocytes Absolute: 0.5 10*3/uL (ref 0.1–1.0)
Monocytes Relative: 14 %
Neutro Abs: 2 10*3/uL (ref 1.7–7.7)
Neutrophils Relative %: 58 %
Platelets: 15 10*3/uL — CL (ref 150–400)
RBC: 2.32 MIL/uL — ABNORMAL LOW (ref 3.87–5.11)
RDW: 16.3 % — ABNORMAL HIGH (ref 11.5–15.5)
WBC: 3.4 10*3/uL — ABNORMAL LOW (ref 4.0–10.5)
nRBC: 0 % (ref 0.0–0.2)

## 2022-01-21 LAB — BASIC METABOLIC PANEL
Anion gap: 5 (ref 5–15)
BUN: 6 mg/dL — ABNORMAL LOW (ref 8–23)
CO2: 20 mmol/L — ABNORMAL LOW (ref 22–32)
Calcium: 7.9 mg/dL — ABNORMAL LOW (ref 8.9–10.3)
Chloride: 111 mmol/L (ref 98–111)
Creatinine, Ser: 1 mg/dL (ref 0.44–1.00)
GFR, Estimated: 57 mL/min — ABNORMAL LOW (ref >60)
Glucose, Bld: 81 mg/dL (ref 70–99)
Potassium: 3.7 mmol/L (ref 3.5–5.1)
Sodium: 136 mmol/L (ref 135–145)

## 2022-01-21 LAB — PREPARE RBC (CROSSMATCH)

## 2022-01-21 LAB — LACTATE DEHYDROGENASE: LDH: 98 U/L (ref 98–192)

## 2022-01-21 LAB — MAGNESIUM: Magnesium: 1.6 mg/dL — ABNORMAL LOW (ref 1.7–2.4)

## 2022-01-21 LAB — PHOSPHORUS: Phosphorus: 2.8 mg/dL (ref 2.5–4.6)

## 2022-01-21 MED ORDER — SODIUM CHLORIDE 0.9% IV SOLUTION: Freq: Once | INTRAVENOUS | Status: AC

## 2022-01-21 MED ORDER — MAGNESIUM SULFATE 4 GM/100ML IV SOLN
4.0000 g | Freq: Once | INTRAVENOUS | Status: AC
  Administered 2022-01-21: 4 g via INTRAVENOUS
  Filled 2022-01-21: qty 100

## 2022-01-21 MED ORDER — THIAMINE HCL 100 MG/ML IJ SOLN
INTRAMUSCULAR | Status: AC
Start: 2022-01-21 — End: ?
  Filled 2022-01-21: qty 6

## 2022-01-21 NOTE — Assessment & Plan Note (Addendum)
Hemoglobin down to 7.0 Baseline appears to be between 8-9 Will transfuse 1 unit prbc 2/21 since she has had on going bleeding/oozing from IV site Hgb stable since transfusion

## 2022-01-21 NOTE — Progress Notes (Signed)
Progress Note   Patient: [name] GYJ:[id number] DOB: 05/30/1941 DOA: 01/17/2022     4 DOS: the patient was seen and examined on 01/21/2022   Brief hospital course: 81 y/o female with history of alcohol dependence, HTN, admitted with C diff colitis. She has been started on oral vancomycin. Also noted to be severely thrombocytopenic, felt to be related to infectious process and ETOH. Monitoring for ETOH withdrawal symptoms  Assessment and Plan: * C. difficile colitis- (present on admission) Having 5-6 BM daily for 10 days prior to admission CT shows evidence of colitis, Stool C diff antigen and toxin positive Started on oral vancomycin Continues to have loose bowel movements, thus far had 3  BM today  Normocytic anemia Hemoglobin down to 7.0 Baseline appears to be between 8-9 Will transfuse 1 unit prbc 2/21 since she has had on going bleeding/oozing from IV site  Protein-calorie malnutrition, severe- (present on admission) Albumin 2.2 Nutrition consult Liberalize diet Started on boost breeze 3 times daily  Elevated TSH- (present on admission) Unclear significance She does not appear to be on thyroid replacement as outpatient TSH currently 4.5, but this is down from 9.5 on 11/03/21 Would repeat in 4 weeks as outpatient  HTN (hypertension)- (present on admission) Continue to hold metoprolol and Spironolactone for now since blood pressures have been borderline    Sepsis ([place])- (present on admission) On admission: Heart rate 101, respiratory rate 30, systolic blood pressure less than 90, platelets 17 Most likely secondary to C diff colitis Sepsis order set utilized Procalcitonin 0.2 Hemodynamics stabilizing with IV fluids Continue to monitor  Seizures (HCC) Continue Keppra Seizure precautions   Thrombocytopenia ([place])- (present on admission) Patient is chronically thrombocytopenic, seems like baseline is in 70-80s Admitted with platelets of 17  LDH normal, DIC  panel neg, no schistocytes, B12 level is not low She received a unit of platelets in the ED with transient improvement of PLTs to 39, but back down to 18  Suspect this is related to infectious process as well as alcohol use Platelets down to 15 on 2/21 and patient noted to be oozing around midline IV Transfusing a unit of platelets on 2/21    Alcohol dependence ([place])- (present on admission) Per caregiver, patient drinks approximately 2 bottles of wine per day Last drink was prior to coming to ED on 2/17 CIWA protocol with ativan Fortunately, she has not required much Ativan. Continue thiamine supplementation  Continue folic acid supplementation Check thiamine level and start on high dose supplementation  Physical deconditioning- (present on admission) Seen by PT with recommendations for SNF Discussed with patient daughter and they will consider if they cannot arrange caregivers at home She has been to [facility] in the past  Depression- (present on admission) Continue Lexapro        Subjective: she had had about 3 stools today. Staff reports oozing/bleeding from midline IV  Physical Exam: Vitals:   01/21/22 1712 01/21/22 1900 01/21/22 1941 01/21/22 2046  BP: (!) 143/90 123/90 (!) 141/88 (!) 129/104  Pulse: 90 (!) 57 94 91  Resp: 18 19 18 18   Temp: 98.3 F (36.8 C) 98.1 F (36.7 C) 98.3 F (36.8 C) 98 F (36.7 C)  TempSrc: Oral Oral Oral   SpO2: 100% 100% 96% 100%  Weight:      Height:       General exam: Alert, awake, no distress Respiratory system: Clear to auscultation. Respiratory effort normal. Cardiovascular system:RRR. No murmurs, rubs, gallops. Gastrointestinal system: Abdomen is nondistended,  soft and nontender. No organomegaly or masses felt. Normal bowel sounds heard. Central nervous system: Alert and oriented. No focal neurological deficits. Extremities: oozing/bruising from around right arm midline Skin: bruising on upper and lower  extremities Psychiatry: confused but pleasant   Data Reviewed:  Reviewed, cbc, chemistry/electrolytes, dic panel. Ordered repeat cbc, and chemistry in am  Family Communication: discussed with daugther kirsten at bedside  Disposition: Status is: Inpatient Remains inpatient appropriate because: continued management of anemia/thrombocytopenia and C diff diarrhea          Planned Discharge Destination: Skilled nursing facility     Time spent: 35 minutes  Author: Kathie Dike, MD 01/21/2022 10:01 PM  For on call review [email or web address].

## 2022-01-21 NOTE — TOC Progression Note (Signed)
Transition of Care [facility]) - Progression Note    Patient Details  Name: [name] MRN: [id number] Date of Birth: [birth year]  Transition [facility]) CM/SW Contact  Ihor Gully, LCSW Phone Number: 01/21/2022, 1:58 PM  Clinical Narrative:    Discussed patient's PT evaluation and recommendations. Daughter, [name], indicated that they can handle min/mod assist for transfers and very short household distances. Discussed Cdiff diagnosis.  [name] now plans on contacting the agency that supplies her caregivers to ensure that they will send caregivers out with the Cdiff diagnosis and return contact.  Patient has been in SNF previously at [facility]. Placement is difficult for patient.  Patient does not drive as daughter took her car. Upon discharge from [facility] (last admission), patient walked two miles to a convenience store to purchase wine.  Patient has been in treatment for ETOH use in the past.     Expected Discharge Plan: Home/Self Care Barriers to Discharge: Continued Medical Work up  Expected Discharge Plan and Services Expected Discharge Plan: Home/Self Care       Living arrangements for the past 2 months: Single Family Home                                       Social Determinants of Health (SDOH) Interventions    Readmission Risk Interventions Readmission Risk Prevention Plan 11/06/2021  Transportation Screening Complete  Home Care Screening Complete  Medication Review (RN CM) Complete  Some recent data might be hidden

## 2022-01-21 NOTE — Evaluation (Signed)
Physical Therapy Evaluation Patient Details Name: [name] MRN: [id number] DOB: January 16, 1941 Today's Date: 01/21/2022  History of Present Illness  [name] is a 81 y.o. female with medical history significant of with history of alcohol dependence, alcoholic liver disease, lung cancer, degenerative disc disease, depression, GERD, seizure, subarachnoid bleed, chronic diarrhea, and more presents ED with chief complaint of diarrhea.  Unfortunately during her time in the ED she became agitated and was given half milligram of Ativan.  Patient is altered as a result of the Ativan, not able to answer questions.  She wakes up briefly, if negative tone mumbling's, and then goes back to sleep.  Chart review reveals that she had come in for chief complaint of diarrhea.  She has had diarrhea for about 5 days, 4 episodes a day.  At that time she was denying fever and cough.  She also denied vomiting.  Caregiver and daughter reported that patient was an alcoholic and drinks every day.  Labs are done that showed a critical platelet of 17,000.  Provider offered admission, patient refused.  ED provider was able to convince patient to stay for a unit of platelets platelets had to be ordered from [facility], the extended wait made patient very agitated.  This when she was given the Ativan.  Now she is not able to provide any meaningful history.   Clinical Impression  Patient presents up in chair (assisted by nursing staff) and agreeable for therapy.  Patent demonstrates slow labored cadence during ambulation, once fatigued had to lean against bed to maintain standing balance and unable to ambulate farther due to fall risk.  Patient required much time to reposition her self in bed and reluctant to accept assistance - nurse notified.  Patient will benefit from continued skilled physical therapy in hospital and recommended venue below to increase strength, balance, endurance for safe ADLs and gait.         Recommendations for follow up therapy are one component of a multi-disciplinary discharge planning process, led by the attending physician.  Recommendations may be updated based on patient status, additional functional criteria and insurance authorization.  Follow Up Recommendations Skilled nursing-short term rehab (<3 hours/day)    Assistance Recommended at Discharge Intermittent Supervision/Assistance  Patient can return home with the following  A lot of help with walking and/or transfers;A little help with bathing/dressing/bathroom;Assistance with cooking/housework;Help with stairs or ramp for entrance    Equipment Recommendations Rolling walker (2 wheels)  Recommendations for Other Services       Functional Status Assessment Patient has had a recent decline in their functional status and demonstrates the ability to make significant improvements in function in a reasonable and predictable amount of time.     Precautions / Restrictions Precautions Precautions: Fall Restrictions Weight Bearing Restrictions: No      Mobility  Bed Mobility Overal bed mobility: Needs Assistance Bed Mobility: Sit to Supine       Sit to supine: Min assist, Mod assist   General bed mobility comments: slow labored movement    Transfers Overall transfer level: Needs assistance Equipment used: Rolling walker (2 wheels) Transfers: Sit to/from Stand, Bed to chair/wheelchair/BSC Sit to Stand: Min assist, Mod assist   Step pivot transfers: Min assist, Mod assist       General transfer comment: slow labored unsteady movement    Ambulation/Gait Ambulation/Gait assistance: Min assist, Mod assist Gait Distance (Feet): 15 Feet Assistive device: Rolling walker (2 wheels) Gait Pattern/deviations: Decreased step length - right,  Decreased step length - left, Decreased stride length Gait velocity: decreased     General Gait Details: slow labored cadence, once fatigued tends to lose balance  having to sit at bedside to avoid falling  Stairs            Wheelchair Mobility    Modified Rankin (Stroke Patients Only)       Balance Overall balance assessment: Needs assistance Sitting-balance support: Feet supported, No upper extremity supported Sitting balance-Leahy Scale: Fair Sitting balance - Comments: fair/good seated at EOB   Standing balance support: Reliant on assistive device for balance, During functional activity, Bilateral upper extremity supported Standing balance-Leahy Scale: Poor Standing balance comment: fair/poor using RW                             Pertinent Vitals/Pain Pain Assessment Pain Assessment: Faces Faces Pain Scale: Hurts little more Pain Location: pressure to BUE or legs Pain Descriptors / Indicators: Grimacing, Guarding Pain Intervention(s): Limited activity within patient's tolerance, Monitored during session, Repositioned    Home Living Family/patient expects to be discharged to:: Private residence Living Arrangements: Alone Available Help at Discharge: Personal care attendant;Available PRN/intermittently Type of Home: House Home Access: Stairs to enter Entrance Stairs-Rails: Right;Left Entrance Stairs-Number of Steps: 6 Alternate Level Stairs-Number of Steps: flight Home Layout: Multi-level;Able to live on main level with bedroom/bathroom Home Equipment: Rolling Walker (2 wheels);Other (comment)      Prior Function Prior Level of Function : Needs assist             Mobility Comments: household ambulator, drives ADLs Comments: friend assist, "per patient"     Hand Dominance   Dominant Hand: Right    Extremity/Trunk Assessment   Upper Extremity Assessment Upper Extremity Assessment: Generalized weakness    Lower Extremity Assessment Lower Extremity Assessment: Generalized weakness    Cervical / Trunk Assessment Cervical / Trunk Assessment: Normal  Communication   Communication: No difficulties   Cognition Arousal/Alertness: Awake/alert Behavior During Therapy: Anxious, Agitated Overall Cognitive Status: No family/caregiver present to determine baseline cognitive functioning                                          General Comments      Exercises     Assessment/Plan    PT Assessment Patient needs continued PT services  PT Problem List Decreased strength;Decreased activity tolerance;Decreased balance;Decreased mobility       PT Treatment Interventions DME instruction;Gait training;Stair training;Functional mobility training;Therapeutic activities;Therapeutic exercise;Patient/family education;Balance training    PT Goals (Current goals can be found in the Care Plan section)  Acute Rehab PT Goals Patient Stated Goal: return home with friends to assist PT Goal Formulation: With patient Time For Goal Achievement: 02/04/22 Potential to Achieve Goals: Good    Frequency Min 3X/week     Co-evaluation               AM-PAC PT "6 Clicks" Mobility  Outcome Measure Help needed turning from your back to your side while in a flat bed without using bedrails?: A Little Help needed moving from lying on your back to sitting on the side of a flat bed without using bedrails?: A Little Help needed moving to and from a bed to a chair (including a wheelchair)?: A Lot Help needed standing up from a chair using your arms (e.g.,  wheelchair or bedside chair)?: A Little Help needed to walk in hospital room?: A Lot Help needed climbing 3-5 steps with a railing? : A Lot 6 Click Score: 15    End of Session   Activity Tolerance: Patient tolerated treatment well;Patient limited by fatigue Patient left: in bed;with call bell/phone within reach Nurse Communication: Mobility status PT Visit Diagnosis: Unsteadiness on feet (R26.81);Other abnormalities of gait and mobility (R26.89);Muscle weakness (generalized) (M62.81)    Time: 4883-0141 PT Time Calculation (min) (ACUTE  ONLY): 21 min   Charges:   PT Evaluation $PT Eval Moderate Complexity: 1 Mod PT Treatments $Therapeutic Activity: 8-22 mins        2:41 PM, 01/21/22 Lonell Grandchild, MPT Physical Therapist with [facility] [phone] office [phone] mobile phone

## 2022-01-21 NOTE — Plan of Care (Signed)
°  Problem: Acute Rehab PT Goals(only PT should resolve) Goal: Pt Will Go Supine/Side To Sit Outcome: Progressing Flowsheets (Taken 01/21/2022 1442) Pt will go Supine/Side to Sit: with min guard assist Goal: Patient Will Transfer Sit To/From Stand Outcome: Progressing Flowsheets (Taken 01/21/2022 1442) Patient will transfer sit to/from stand: with min guard assist Goal: Pt Will Transfer Bed To Chair/Chair To Bed Outcome: Progressing Flowsheets (Taken 01/21/2022 1442) Pt will Transfer Bed to Chair/Chair to Bed:  min guard assist  with min assist Goal: Pt Will Ambulate Outcome: Progressing Flowsheets (Taken 01/21/2022 1442) Pt will Ambulate:  50 feet  with min guard assist  with minimal assist  with rolling walker   2:42 PM, 01/21/22 Lonell Grandchild, MPT Physical Therapist with [facility] [phone] office [phone] mobile phone

## 2022-01-21 NOTE — Assessment & Plan Note (Signed)
Seen by PT with recommendations for SNF Discussed with patient daughter and they will consider if they cannot arrange caregivers at home She has been to [facility] in the past

## 2022-01-22 DIAGNOSIS — E43 Unspecified severe protein-calorie malnutrition: Secondary | ICD-10-CM | POA: Diagnosis not present

## 2022-01-22 DIAGNOSIS — A0472 Enterocolitis due to Clostridium difficile, not specified as recurrent: Secondary | ICD-10-CM | POA: Diagnosis not present

## 2022-01-22 DIAGNOSIS — D696 Thrombocytopenia, unspecified: Secondary | ICD-10-CM | POA: Diagnosis not present

## 2022-01-22 DIAGNOSIS — F1021 Alcohol dependence, in remission: Secondary | ICD-10-CM | POA: Diagnosis not present

## 2022-01-22 LAB — TYPE AND SCREEN
ABO/RH(D): O POS
Antibody Screen: NEGATIVE
Unit division: 0

## 2022-01-22 LAB — COMPREHENSIVE METABOLIC PANEL
ALT: 11 U/L (ref 0–44)
AST: 17 U/L (ref 15–41)
Albumin: 2.3 g/dL — ABNORMAL LOW (ref 3.5–5.0)
Alkaline Phosphatase: 69 U/L (ref 38–126)
Anion gap: 6 (ref 5–15)
BUN: <5 mg/dL — ABNORMAL LOW (ref 8–23)
CO2: 21 mmol/L — ABNORMAL LOW (ref 22–32)
Calcium: 7.7 mg/dL — ABNORMAL LOW (ref 8.9–10.3)
Chloride: 108 mmol/L (ref 98–111)
Creatinine, Ser: 1.02 mg/dL — ABNORMAL HIGH (ref 0.44–1.00)
GFR, Estimated: 56 mL/min — ABNORMAL LOW (ref >60)
Glucose, Bld: 76 mg/dL (ref 70–99)
Potassium: 3.1 mmol/L — ABNORMAL LOW (ref 3.5–5.1)
Sodium: 135 mmol/L (ref 135–145)
Total Bilirubin: 0.7 mg/dL (ref 0.3–1.2)
Total Protein: 4.5 g/dL — ABNORMAL LOW (ref 6.5–8.1)

## 2022-01-22 LAB — BPAM PLATELET PHERESIS
Blood Product Expiration Date: 202302232359
ISSUE DATE / TIME: 202302211712
Unit Type and Rh: 6200

## 2022-01-22 LAB — CBC
HCT: 27.4 % — ABNORMAL LOW (ref 36.0–46.0)
Hemoglobin: 9.5 g/dL — ABNORMAL LOW (ref 12.0–15.0)
MCH: 30.4 pg (ref 26.0–34.0)
MCHC: 34.7 g/dL (ref 30.0–36.0)
MCV: 87.5 fL (ref 80.0–100.0)
Platelets: 34 10*3/uL — ABNORMAL LOW (ref 150–400)
RBC: 3.13 MIL/uL — ABNORMAL LOW (ref 3.87–5.11)
RDW: 16.9 % — ABNORMAL HIGH (ref 11.5–15.5)
WBC: 5 10*3/uL (ref 4.0–10.5)
nRBC: 0 % (ref 0.0–0.2)

## 2022-01-22 LAB — PREPARE PLATELET PHERESIS: Unit division: 0

## 2022-01-22 LAB — BPAM RBC
Blood Product Expiration Date: 202303022359
ISSUE DATE / TIME: 202302211233
Unit Type and Rh: 9500

## 2022-01-22 LAB — VITAMIN B1: Vitamin B1 (Thiamine): 182.2 nmol/L (ref 66.5–200.0)

## 2022-01-22 LAB — MAGNESIUM: Magnesium: 2.4 mg/dL (ref 1.7–2.4)

## 2022-01-22 LAB — PHOSPHORUS: Phosphorus: 3.3 mg/dL (ref 2.5–4.6)

## 2022-01-22 LAB — GLUCOSE, CAPILLARY: Glucose-Capillary: 83 mg/dL (ref 70–99)

## 2022-01-22 MED ORDER — CHLORHEXIDINE GLUCONATE 0.12 % MT SOLN
15.0000 mL | Freq: Two times a day (BID) | OROMUCOSAL | Status: DC
  Administered 2022-01-22 – 2022-01-25 (×6): 15 mL via OROMUCOSAL
  Filled 2022-01-22 (×6): qty 15

## 2022-01-22 MED ORDER — ORAL CARE MOUTH RINSE
15.0000 mL | Freq: Two times a day (BID) | OROMUCOSAL | Status: DC
  Administered 2022-01-23 – 2022-01-24 (×4): 15 mL via OROMUCOSAL

## 2022-01-22 NOTE — NC FL2 (Signed)
[place] LEVEL OF CARE SCREENING TOOL     IDENTIFICATION  Patient Name: [name] Birthdate: 01-Dec-1941 Sex: female Admission Date (Current Location): 01/17/2022  [facility] and [place] Number:  [employer] and Address:  [place] [address] [place]      Provider Number: [id number]  Attending Physician Name and Address:  Orson Eva, MD  Relative Name and Phone Number:  [facility] "[facility]" (Daughter)   [phone] (Mobile)    Current Level of Care: Hospital Recommended Level of Care: [place] Prior Approval Number:    Date Approved/Denied:   PASRR Number: pending  Discharge Plan: SNF    Current Diagnoses: Patient Active Problem List   Diagnosis Date Noted   Normocytic anemia 01/21/2022   Protein-calorie malnutrition, severe 01/20/2022   Elevated TSH 01/19/2022   C. 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 05/19/2021   Cerebral atrophy ([place]) 05/19/2021   Generalized weakness 05/19/2021   Rhabdomyolysis 82/99/3716   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[place]) 05/19/2021   Atherosclerosis of aorta ([place]) 05/19/2021   Multiple skin tears 05/19/2021   Multiple Bruising sites 05/19/2021   Poor historian 05/19/2021   Acute renal injury ([place]) 05/19/2021   Transaminitis 05/19/2021   Bilirubinemia 05/19/2021   Hypoproteinemia ([place]) 05/19/2021   Elevated  troponin 05/19/2021   Vitamin B12 deficiency 05/19/2021   Macrocytic anemia 05/19/2021   TSH elevation 05/19/2021   Pyuria 05/19/2021   Hematuria 05/19/2021   Dehydration 05/19/2021   Cerebral atrophy ([place]) 05/19/2021   Degenerative disc disease, cervical 05/19/2021   Bilateral Periorbital ecchymosis 05/19/2021   Myocardial injury    Elevated CK    Fall 05/18/2021   Multiple closed fractures of ribs of left side 05/18/2021   Alteration consciousness 12/10/2020   Gait abnormality 12/10/2020   Sepsis ([place]) 02/29/2020   Acute lower UTI 02/29/2020   Unsteady gait 01/10/2020   Seizures ([place]) 01/10/2020   Osteoarthritis of left knee    Physical deconditioning 12/19/2019   Alcohol dependence ([place]) 12/19/2019   Thrombocytopenia ([place]) 12/19/2019   Subarachnoid hemorrhage ([place]) 12/19/2019   SAH (subarachnoid hemorrhage) ([place])    Transaminasemia    Subarachnoid bleed ([place]) 12/18/2019   Hypokalemia 12/18/2019   Depression    Hyperbilirubinemia    Hyponatremia    Macrocytic anemia    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[place])    Arthritis 05/10/2019   Diarrhea 10/13/2017   Recurrent kidney stones 10/31/2014   Left ureteral stone 09/08/2014   Right ureteral stone 09/08/2014   Infection of urinary tract 09/08/2014   Pain in joint, shoulder region 08/09/2012   Muscle weakness (generalized) 08/09/2012   Impingement syndrome of left shoulder 08/09/2012    Orientation RESPIRATION BLADDER Height & Weight     Self, Place  Normal Incontinent Weight: 84 lb 14 oz (38.5 kg) Height:  5' (152.4 cm)  BEHAVIORAL SYMPTOMS/MOOD NEUROLOGICAL BOWEL NUTRITION STATUS      Incontinent Diet (regular)  AMBULATORY STATUS COMMUNICATION  OF NEEDS Skin   Limited Assist Verbally PU Stage and Appropriate Care (skin tear: arm, left, posterior, upper. Mositure associated skin damage: groin, right, left)                       Personal Care Assistance Level of Assistance  Bathing, Feeding, Dressing Bathing  Assistance: Limited assistance   Dressing Assistance: Limited assistance     Functional Limitations Info  Sight, Hearing, Speech Sight Info: Adequate Hearing Info: Adequate Speech Info: Adequate    SPECIAL CARE FACTORS FREQUENCY  PT (By licensed PT)     PT Frequency: 5x/week              Contractures Contractures Info: Not present    Additional Factors Info  Code Status, Allergies, Psychotropic, Isolation Precautions Code Status Info: DNR Allergies Info: Clindamycin, Clindamycin/lincomycin, Doxycycline, Doxycycline hyclate Psychotropic Info: Lexapro   Isolation Precautions Info: Enteric precautions     Current Medications (01/22/2022):  This is the current hospital active medication list Current Facility-Administered Medications  Medication Dose Route Frequency Provider Last Rate Last Admin   acetaminophen (TYLENOL) tablet 650 mg  650 mg Oral Q6H PRN Zierle-Ghosh, Asia B, DO       Or   acetaminophen (TYLENOL) suppository 650 mg  650 mg Rectal Q6H PRN Zierle-Ghosh, Asia B, DO       Chlorhexidine Gluconate Cloth 2 % PADS 6 each  6 each Topical Daily Kathie Dike, MD   6 each at 01/22/22 1100   escitalopram (LEXAPRO) tablet 20 mg  20 mg Oral q AM Zierle-Ghosh, Asia B, DO   20 mg at 01/22/22 1259   feeding supplement (BOOST / RESOURCE BREEZE) liquid 1 Container  1 Container Oral TID BM Kathie Dike, MD   1 Container at 30/09/23 3007   folic acid (FOLVITE) tablet 1 mg  1 mg Oral Daily Zierle-Ghosh, Asia B, DO   1 mg at 01/22/22 1009   Gerhardt's butt cream   Topical BID Kathie Dike, MD   Given at 01/22/22 1009   levETIRAcetam (KEPPRA) tablet 500 mg  500 mg Oral Daily Zierle-Ghosh, Asia B, DO   500 mg at 01/22/22 1009   multivitamin with minerals tablet 1 tablet  1 tablet Oral Daily Zierle-Ghosh, Asia B, DO   1 tablet at 01/22/22 1010   nystatin cream (MYCOSTATIN)   Topical BID Kathie Dike, MD   Given at 01/22/22 1010   ondansetron (ZOFRAN) tablet 4 mg  4 mg Oral  Q6H PRN Zierle-Ghosh, Asia B, DO       Or   ondansetron (ZOFRAN) injection 4 mg  4 mg Intravenous Q6H PRN Zierle-Ghosh, Asia B, DO       oxyCODONE (Oxy IR/ROXICODONE) immediate release tablet 5 mg  5 mg Oral Q4H PRN Zierle-Ghosh, Asia B, DO       vancomycin (VANCOCIN) capsule 125 mg  125 mg Oral QID Kathie Dike, MD   125 mg at 01/22/22 1009     Discharge Medications: Please see discharge summary for a list of discharge medications.  Relevant Imaging Results:  Relevant Lab Results:   Additional Information SS# [id number]. Patient vaccinated for COVID on 12/27/19 and 01/24/20  Ihor Gully, LCSW

## 2022-01-22 NOTE — Progress Notes (Signed)
MD Zierle-Ghosh made aware of the appearance of the patients midline. Midline has no blood return.

## 2022-01-22 NOTE — TOC Progression Note (Signed)
Transition of Care [facility]) - Progression Note    Patient Details  Name: [name] MRN: [id number] Date of Birth: 1941/03/25  [facility]) CM/SW Contact  Ihor Gully, LCSW Phone Number: 01/22/2022, 1:42 PM  Clinical Narrative:    Spoke with daughter, [name], regarding d/c plan. [name] and her sister have a conference call scheduled to discuss discharge plan with caregivers. Requested that patient be referred to SNF.   Expected Discharge Plan: Home/Self Care Barriers to Discharge: Continued Medical Work up  Expected Discharge Plan and Services Expected Discharge Plan: Home/Self Care       Living arrangements for the past 2 months: Single Family Home                                       Social Determinants of Health (SDOH) Interventions    Readmission Risk Interventions Readmission Risk Prevention Plan 11/06/2021  Transportation Screening Complete  Home Care Screening Complete  Medication Review (RN CM) Complete  Some recent data might be hidden

## 2022-01-22 NOTE — Progress Notes (Signed)
PROGRESS NOTE  [name] YQM:[id number] DOB: 11-May-1941 DOA: 01/17/2022 PCP: Asencion Noble, MD  Brief History:  81 y/o female with history of alcohol dependence, HTN, admitted with C diff colitis. She has been started on oral vancomycin. Also noted to be severely thrombocytopenic, felt to be related to infectious process and ETOH. Monitoring for ETOH withdrawal symptoms.  The patient remains stable without signs of withdrawal.  Unfortunately, patient's platelets continue to trend down requiring 2 units platelets to be given this admission.    Assessment and Plan: * C. difficile colitis- (present on admission) Having 5-6 BM daily for 10 days prior to admission 01/17/22 CT abd--wall thickening distal sigmoid and rectum Stool C diff antigen and toxin positive Started on oral vancomycin Continues to have loose bowel movements, thus far had 3  BM today  Normocytic anemia Hemoglobin down to 7.0 Baseline appears to be between 8-9 Will transfuse 1 unit prbc 2/21 since she has had on going bleeding/oozing from IV site  Protein-calorie malnutrition, severe- (present on admission) Albumin 2.2 Nutrition consult Liberalize diet Started on boost breeze 3 times daily  Elevated TSH- (present on admission) Euthyroid sick syndrome She does not appear to be on thyroid replacement as outpatient TSH currently 4.5, but this is down from 9.5 on 11/03/21 Normal Free T4 11/06/21 Would repeat in 4 weeks as outpatient  HTN (hypertension)- (present on admission) Continue to hold metoprolol and Spironolactone for now since blood pressures have been borderline    Sepsis ([place])- (present on admission) On admission: Heart rate 101, respiratory rate 30, systolic blood pressure less than 90, platelets 17 Most likely secondary to C diff colitis Sepsis order set utilized Procalcitonin 0.2 Hemodynamics stabilizing with IV fluids Continue to monitor  Seizures ([facility]) Continue Keppra Seizure  precautions   Thrombocytopenia ([place])- (present on admission) Patient is chronically thrombocytopenic, seems like baseline is in 70-80s Admitted with platelets of 17  LDH normal, DIC panel neg, no schistocytes, B12 level is not low She received a unit of platelets in the ED with transient improvement of PLTs to 39, but back down to 18  Suspect this is related to infectious process as well as alcohol use Platelets down to 15 on 2/21 and patient noted to be oozing around midline IV Transfusing a unit of platelets on 2/21    Alcohol dependence ([place])- (present on admission) Per caregiver, patient drinks approximately 2 bottles of wine per day Last drink was prior to coming to ED on 2/17 CIWA protocol with ativan Fortunately, she has not required much Ativan. Continue thiamine supplementation  Continue folic acid supplementation Check thiamine level and start on high dose supplementation  Physical deconditioning- (present on admission) Seen by PT with recommendations for SNF Discussed with patient daughter and they will consider if they cannot arrange caregivers at home She has been to [facility] in the past  Depression- (present on admission) Continue Lexapro         Status is: Inpatient Remains inpatient appropriate because: severity of illness with profound thrombocytopenia            Family Communication:   no Family at bedside  Consultants:  none  Code Status:   DNR  DVT Prophylaxis:  [place]  Procedures: As Listed in Progress Note Above  Antibiotics: Po vanco 2/19>>   Subjective: Patient denies fevers, chills, headache, chest pain, dyspnea, nausea, vomiting, diarrhea, abdominal pain, dysuria, hematuria, hematochezia, and melena.   Objective: Vitals:  01/22/22 0534 01/22/22 1400 01/22/22 1708 01/22/22 1730  BP: (!) 156/85 (!) 144/103  (!) 182/90  Pulse: 72 89 80 81  Resp: 18 20 18 20   Temp: 97.8 F (36.6 C) 98.2 F (36.8 C)    TempSrc: Oral  Oral    SpO2: 100% 97% 99% 100%  Weight:      Height:        Intake/Output Summary (Last 24 hours) at 01/22/2022 1752 Last data filed at 01/22/2022 1043 Gross per 24 hour  Intake 828 ml  Output --  Net 828 ml   Weight change:  Exam:  General:  Pt is alert, follows commands appropriately, not in acute distress HEENT: No icterus, No thrush, No neck mass, [place]/AT Cardiovascular: RRR, S1/S2, no rubs, no gallops Respiratory: diminished BS.  Bibasilar rales. No wheeze Abdomen: Soft/+BS, non tender, non distended, no guarding Extremities: No edema, No lymphangitis, No petechiae, No rashes, no synovitis   Data Reviewed: I have personally reviewed following labs and imaging studies Basic Metabolic Panel: Recent Labs  Lab 01/18/22 0217 01/19/22 0943 01/20/22 0417 01/21/22 0404 01/22/22 0521  NA 134* 132* 136 136 135  K 3.4* 3.3* 4.0 3.7 3.1*  CL 108 107 110 111 108  CO2 19* 19* 21* 20* 21*  GLUCOSE 74 114* 87 81 76  BUN 18 11 9  6* <5*  CREATININE 1.04* 0.92 1.12* 1.00 1.02*  CALCIUM 7.3* 7.5* 7.9* 7.9* 7.7*  MG 1.7 1.7 1.7 1.6* 2.4  PHOS  --  2.3* 2.1* 2.8 3.3   Liver Function Tests: Recent Labs  Lab 01/17/22 1219 01/18/22 0217 01/19/22 0943 01/20/22 0417 01/22/22 0521  AST 18 13* 16  --  17  ALT 14 10 12   --  11  ALKPHOS 123 87 82  --  69  BILITOT 0.3 0.3 0.4  --  0.7  PROT 6.0* 4.4* 4.5*  --  4.5*  ALBUMIN 3.1* 2.2* 2.2* 2.2* 2.3*   No results for input(s): LIPASE, AMYLASE in the last 168 hours. No results for input(s): AMMONIA in the last 168 hours. Coagulation Profile: Recent Labs  Lab 01/18/22 0217 01/21/22 0404  INR 1.2 1.2   CBC: Recent Labs  Lab 01/17/22 1319 01/18/22 0217 01/19/22 0943 01/20/22 0417 01/21/22 0404 01/22/22 0521  WBC 7.0 4.2 4.0 3.8* 3.4* 5.0  NEUTROABS 5.0 2.8  --   --  2.0  --   HGB 9.7* 8.0* 8.6* 7.3* 7.0* 9.5*  HCT 30.1* 24.8* 27.7* 23.6* 21.9* 27.4*  MCV 95.0 94.3 97.2 92.9 94.4 87.5  PLT 17* 39* 18* 18* 15*   15* 34*    Cardiac Enzymes: No results for input(s): CKTOTAL, CKMB, CKMBINDEX, TROPONINI in the last 168 hours. BNP: Invalid input(s): POCBNP CBG: Recent Labs  Lab 01/22/22 0010  GLUCAP 83   HbA1C: No results for input(s): HGBA1C in the last 72 hours. Urine analysis:    Component Value Date/Time   COLORURINE AMBER (A) 05/18/2021 1236   APPEARANCEUR TURBID (A) 05/18/2021 1236   LABSPEC 1.029 05/18/2021 1236   PHURINE 5.0 05/18/2021 1236   GLUCOSEU NEGATIVE 05/18/2021 1236   HGBUR LARGE (A) 05/18/2021 1236   BILIRUBINUR NEGATIVE 05/18/2021 1236   KETONESUR 5 (A) 05/18/2021 1236   PROTEINUR 30 (A) 05/18/2021 1236   UROBILINOGEN 0.2 09/08/2014 0300   NITRITE NEGATIVE 05/18/2021 1236   LEUKOCYTESUR MODERATE (A) 05/18/2021 1236   Sepsis Labs: @LABRCNTIP (procalcitonin:4,lacticidven:4) ) Recent Results (from the past 240 hour(s))  Resp Panel by RT-PCR (Flu A&B, Covid) Nasopharyngeal Swab  Status: None   Collection Time: 01/17/22 10:58 PM   Specimen: Nasopharyngeal Swab; Nasopharyngeal(NP) swabs in vial transport medium  Result Value Ref Range Status   SARS Coronavirus 2 by RT PCR NEGATIVE NEGATIVE Final    Comment: (NOTE) SARS-CoV-2 target nucleic acids are NOT DETECTED.  The SARS-CoV-2 RNA is generally detectable in upper respiratory specimens during the acute phase of infection. The lowest concentration of SARS-CoV-2 viral copies this assay can detect is 138 copies/mL. A negative result does not preclude SARS-Cov-2 infection and should not be used as the sole basis for treatment or other patient management decisions. A negative result may occur with  improper specimen collection/handling, submission of specimen other than nasopharyngeal swab, presence of viral mutation(s) within the areas targeted by this assay, and inadequate number of viral copies(<138 copies/mL). A negative result must be combined with clinical observations, patient history, and  epidemiological information. The expected result is Negative.  Fact Sheet for Patients:  EntrepreneurPulse.com.au  Fact Sheet for Healthcare Providers:  IncredibleEmployment.be  This test is no t yet approved or cleared by the [place] FDA and  has been authorized for detection and/or diagnosis of SARS-CoV-2 by FDA under an Emergency Use Authorization (EUA). This EUA will remain  in effect (meaning this test can be used) for the duration of the COVID-19 declaration under Section 564(b)(1) of the Act, 21 U.S.C.section 360bbb-3(b)(1), unless the authorization is terminated  or revoked sooner.       Influenza A by PCR NEGATIVE NEGATIVE Final   Influenza B by PCR NEGATIVE NEGATIVE Final    Comment: (NOTE) The Xpert Xpress SARS-CoV-2/FLU/RSV plus assay is intended as an aid in the diagnosis of influenza from Nasopharyngeal swab specimens and should not be used as a sole basis for treatment. Nasal washings and aspirates are unacceptable for Xpert Xpress SARS-CoV-2/FLU/RSV testing.  Fact Sheet for Patients: EntrepreneurPulse.com.au  Fact Sheet for Healthcare Providers: IncredibleEmployment.be  This test is not yet approved or cleared by the [place] FDA and has been authorized for detection and/or diagnosis of SARS-CoV-2 by FDA under an Emergency Use Authorization (EUA). This EUA will remain in effect (meaning this test can be used) for the duration of the COVID-19 declaration under Section 564(b)(1) of the Act, 21 U.S.C. section 360bbb-3(b)(1), unless the authorization is terminated or revoked.  Performed at [facility], [address], [place], [place] [address]   Culture, blood (x 2)     Status: None (Preliminary result)   Collection Time: 01/18/22  2:17 AM   Specimen: Right Antecubital; Blood  Result Value Ref Range Status   Specimen Description RIGHT ANTECUBITAL  Final   Special Requests    Final    BOTTLES DRAWN AEROBIC AND ANAEROBIC Blood Culture adequate volume   Culture  Setup Time NO ORGANISMS SEEN REVIEWED BY [id number] ANA BOTTLE   Final   Culture   Final    NO GROWTH 4 DAYS Performed at [facility], [address], [place], [place] [address]    Report Status PENDING  Incomplete  Culture, blood (x 2)     Status: None (Preliminary result)   Collection Time: 01/18/22  2:24 AM   Specimen: BLOOD RIGHT HAND  Result Value Ref Range Status   Specimen Description BLOOD RIGHT HAND  Final   Special Requests   Final    BOTTLES DRAWN AEROBIC AND ANAEROBIC Blood Culture adequate volume   Culture   Final    NO GROWTH 4 DAYS Performed at [facility], [place]  [address] [place], [place] [address]    Report Status PENDING  Incomplete  Gastrointestinal Panel by PCR , Stool     Status: None   Collection Time: 01/18/22 11:30 PM   Specimen: Stool  Result Value Ref Range Status   Campylobacter species NOT DETECTED NOT DETECTED Final   Plesimonas shigelloides NOT DETECTED NOT DETECTED Final   Salmonella species NOT DETECTED NOT DETECTED Final   Yersinia enterocolitica NOT DETECTED NOT DETECTED Final   Vibrio species NOT DETECTED NOT DETECTED Final   Vibrio cholerae NOT DETECTED NOT DETECTED Final   Enteroaggregative E coli (EAEC) NOT DETECTED NOT DETECTED Final   Enteropathogenic E coli (EPEC) NOT DETECTED NOT DETECTED Final   Enterotoxigenic E coli (ETEC) NOT DETECTED NOT DETECTED Final   Shiga like toxin producing E coli (STEC) NOT DETECTED NOT DETECTED Final   Shigella/Enteroinvasive E coli (EIEC) NOT DETECTED NOT DETECTED Final   Cryptosporidium NOT DETECTED NOT DETECTED Final   Cyclospora cayetanensis NOT DETECTED NOT DETECTED Final   Entamoeba histolytica NOT DETECTED NOT DETECTED Final   Giardia lamblia NOT DETECTED NOT DETECTED Final   Adenovirus F40/41 NOT DETECTED NOT DETECTED Final   Astrovirus NOT DETECTED NOT DETECTED Final   Norovirus GI/GII NOT DETECTED NOT DETECTED  Final   Rotavirus A NOT DETECTED NOT DETECTED Final   Sapovirus (I, II, IV, and V) NOT DETECTED NOT DETECTED Final    Comment: Performed at [facility], [address] [place], [place] [address]  C Difficile Quick Screen w PCR reflex     Status: Abnormal   Collection Time: 01/18/22 11:30 PM   Specimen: Stool  Result Value Ref Range Status   C Diff antigen POSITIVE (A) NEGATIVE Final   C Diff toxin POSITIVE (A) NEGATIVE Final   C Diff interpretation Toxin producing C. difficile detected.  Final    Comment: CRITICAL RESULT CALLED TO, READ BACK BY AND VERIFIED WITH:  RIVERS,S 0043 01/19/2022 ACOSTA,A Performed at [facility], [address], [place], [place] [address]      Scheduled Meds:  chlorhexidine  15 mL Mouth Rinse BID   Chlorhexidine Gluconate Cloth  6 each Topical Daily   escitalopram  20 mg Oral q AM   feeding supplement  1 Container Oral TID BM   folic acid  1 mg Oral Daily   Gerhardt's butt cream   Topical BID   levETIRAcetam  500 mg Oral Daily   [START ON 01/23/2022] mouth rinse  15 mL Mouth Rinse q12n4p   multivitamin with minerals  1 tablet Oral Daily   nystatin cream   Topical BID   vancomycin  125 mg Oral QID   Continuous Infusions:  Procedures/Studies: CT ABDOMEN PELVIS WO CONTRAST  Result Date: 01/17/2022 CLINICAL DATA:  Diarrhea for over 1 week. History of liver disease and kidney stones. Acute abdominal pain. EXAM: CT ABDOMEN AND PELVIS WITHOUT CONTRAST TECHNIQUE: Multidetector CT imaging of the abdomen and pelvis was performed following the standard protocol without IV contrast. RADIATION DOSE REDUCTION: This exam was performed according to the departmental dose-optimization program which includes automated exposure control, adjustment of the mA and/or kV according to patient size and/or use of iterative reconstruction technique. COMPARISON:  CT scan 11/02/2021 FINDINGS: Lower chest: Mild scarring in the lingula and left lower lobe. Mild chronic  nodularity in the lingula is unchanged from 02/29/2020 probably from airway plugging. Descending thoracic aortic atherosclerosis. Hepatobiliary: Diffuse hepatic steatosis. Gallbladder unremarkable. No biliary dilatation. Pancreas: Unremarkable Spleen: Unremarkable Adrenals/Urinary Tract: Both adrenal glands  appear unremarkable. Two nonobstructive right renal calculi, the larger in the upper pole measuring 7 mm in short axis. Four nonobstructive left renal calculi, the largest in the lower pole measuring 8 mm in long axis. 2.4 by 2.4 cm hypodense left mid kidney lesion on image 20 series 2, fluid density and favoring cyst. Left renal atrophy and scarring. No hydronephrosis or hydroureter. No ureteral or bladder calculus identified. Stomach/Bowel: Right ileocolic anastomosis without complicating feature. No dilated small bowel. Suspected wall thickening in the sigmoid colon and rectum, with likely semi solid material in the rectum, appearance suspicious for distal colitis. Infectious colitis favored over inflammatory bowel disease or ischemia based on the distribution and appearance. Vascular/Lymphatic: Atherosclerosis is present, including aortoiliac atherosclerotic disease. Reproductive: Uterus absent.  Adnexa unremarkable. Other: No supplemental non-categorized findings. Musculoskeletal: Multiple ventral hernias are visible on image 64 series 6. One of these contains a small margin of the transverse colon as on image 41 series 2. No surrounding edema or specific complicating feature is currently identified. Dextroconvex thoracolumbar scoliosis with substantial rotary component. Old healed bilateral rib fractures. Deformity in the lower sacrum and coccyx compatible with remote fracture, with some new bandlike sclerosis transversely in the lower S2 vertebra compared to 02/29/2020 suspicious for a component of sacral insufficiency fracture. Chronic anterior wedging of the T11 vertebral body. Grade 1 degenerative  retrolisthesis at L3-4 and grade 1 degenerative anterolisthesis at L4-5. The scoliosis in combination with spondylosis and degenerative disc disease contribute to right foraminal impingement at L3-4 and L4-5, and left foraminal impingement at L1-2, L4-5, and L5-S1. IMPRESSION: 1. Wall thickening in the distal sigmoid colon and rectum, distribution and appearance favoring infectious colitis. This probably accounts for the patient's diarrheal process. 2. Patient has multiple ventral hernias containing adipose tissue. One of these also contains a small margin of the transverse colon although no complicating feature is observed. 3. Suspected insufficiency fracture of the sacrum superimposed on chronic deformity of the sacrum and coccyx. 4. Other imaging findings of potential clinical significance: Scarring in the lingula and left lower lobe. Diffuse hepatic steatosis. Bilateral nonobstructive nephrolithiasis. Left renal scarring and atrophy. Fluid density left mid kidney lesion, likely a cyst. Aortic Atherosclerosis (ICD10-I70.0). Lumbar spondylosis, scoliosis, and degenerative disc disease contributing to multilevel foraminal impingement. Electronically Signed   By: Van Clines M.D.   On: 01/17/2022 17:02   US Venous Img Upper Uni Left (DVT)  Result Date: 01/18/2022 CLINICAL DATA:  Arm and hand swelling. EXAM: LEFT UPPER EXTREMITY VENOUS DOPPLER ULTRASOUND TECHNIQUE: Gray-scale sonography with graded compression, as well as color Doppler and duplex ultrasound were performed to evaluate the upper extremity deep venous system from the level of the subclavian vein and including the jugular, axillary, basilic, radial, ulnar and upper cephalic vein. Spectral Doppler was utilized to evaluate flow at rest and with distal augmentation maneuvers. COMPARISON:  None. FINDINGS: Contralateral Subclavian Vein: Unable to evaluate due to patient intolerance. Internal Jugular Vein: No evidence of thrombus. Normal  compressibility, respiratory phasicity and response to augmentation. Subclavian Vein: No visible thrombus with limited evaluation. Axillary Vein: No evidence of thrombus. Normal compressibility, respiratory phasicity and response to augmentation. Cephalic Vein: No visible thrombus in visible portions. Limited views due to bandaging. Basilic Vein: No visible thrombus in visible portions. Limited views due to bandaging. Brachial Veins: No visible thrombus in visible portions. Limited views due to bandaging. Radial Veins: No visible thrombus with limited evaluation due to patient intolerance. Ulnar Veins: No visible thrombus with limited evaluation due to  patient intolerance. Other: Bruising and nonspecific subcutaneous edema throughout the arm. IMPRESSION: 1. Limited study due to bandaging and patient intolerance without visible thrombus. 2. Extensive bruising and nonspecific subcutaneous edema throughout the arm. Electronically Signed   By: Margaretha Sheffield M.D.   On: 01/18/2022 12:37   DG Chest Port 1 View  Result Date: 01/17/2022 CLINICAL DATA:  cough EXAM: PORTABLE CHEST 1 VIEW COMPARISON:  Chest radiograph 11/05/2021. FINDINGS: No consolidation. Small pulmonary nodules better characterized on recent CT chest. Postsurgical change in the upper right chest. No visible pleural effusions or pneumothorax. Chronic mild elevation left hemidiaphragm. Remote left rib fractures. Osteopenia. Polyarticular degenerative change. Lower thoracic dextrocurvature. IMPRESSION: No evidence of acute cardiopulmonary disease. Electronically Signed   By: Margaretha Sheffield M.D.   On: 01/17/2022 12:24    Orson Eva, DO  [facility]  If 7PM-7AM, please contact night-coverage [email or web address] Password [facility] 01/22/2022, 5:52 PM   LOS: 5 days

## 2022-01-23 DIAGNOSIS — F1020 Alcohol dependence, uncomplicated: Secondary | ICD-10-CM | POA: Diagnosis not present

## 2022-01-23 DIAGNOSIS — A419 Sepsis, unspecified organism: Secondary | ICD-10-CM

## 2022-01-23 DIAGNOSIS — A0472 Enterocolitis due to Clostridium difficile, not specified as recurrent: Secondary | ICD-10-CM | POA: Diagnosis not present

## 2022-01-23 DIAGNOSIS — D696 Thrombocytopenia, unspecified: Secondary | ICD-10-CM | POA: Diagnosis not present

## 2022-01-23 LAB — CULTURE, BLOOD (ROUTINE X 2)
Culture  Setup Time: NONE SEEN
Culture: NO GROWTH
Culture: NO GROWTH
Special Requests: ADEQUATE
Special Requests: ADEQUATE

## 2022-01-23 LAB — CBC WITH DIFFERENTIAL/PLATELET
Abs Immature Granulocytes: 0.04 10*3/uL (ref 0.00–0.07)
Basophils Absolute: 0 10*3/uL (ref 0.0–0.1)
Basophils Relative: 1 %
Eosinophils Absolute: 0 10*3/uL (ref 0.0–0.5)
Eosinophils Relative: 1 %
HCT: 29.3 % — ABNORMAL LOW (ref 36.0–46.0)
Hemoglobin: 9.8 g/dL — ABNORMAL LOW (ref 12.0–15.0)
Immature Granulocytes: 1 %
Lymphocytes Relative: 24 %
Lymphs Abs: 1.3 10*3/uL (ref 0.7–4.0)
MCH: 29.9 pg (ref 26.0–34.0)
MCHC: 33.4 g/dL (ref 30.0–36.0)
MCV: 89.3 fL (ref 80.0–100.0)
Monocytes Absolute: 0.5 10*3/uL (ref 0.1–1.0)
Monocytes Relative: 10 %
Neutro Abs: 3.4 10*3/uL (ref 1.7–7.7)
Neutrophils Relative %: 63 %
Platelets: 49 10*3/uL — ABNORMAL LOW (ref 150–400)
RBC: 3.28 MIL/uL — ABNORMAL LOW (ref 3.87–5.11)
RDW: 17.4 % — ABNORMAL HIGH (ref 11.5–15.5)
WBC: 5.3 10*3/uL (ref 4.0–10.5)
nRBC: 0 % (ref 0.0–0.2)

## 2022-01-23 LAB — BASIC METABOLIC PANEL
Anion gap: 7 (ref 5–15)
BUN: 6 mg/dL — ABNORMAL LOW (ref 8–23)
CO2: 22 mmol/L (ref 22–32)
Calcium: 8.3 mg/dL — ABNORMAL LOW (ref 8.9–10.3)
Chloride: 107 mmol/L (ref 98–111)
Creatinine, Ser: 1.17 mg/dL — ABNORMAL HIGH (ref 0.44–1.00)
GFR, Estimated: 47 mL/min — ABNORMAL LOW (ref >60)
Glucose, Bld: 136 mg/dL — ABNORMAL HIGH (ref 70–99)
Potassium: 2.8 mmol/L — ABNORMAL LOW (ref 3.5–5.1)
Sodium: 136 mmol/L (ref 135–145)

## 2022-01-23 MED ORDER — POTASSIUM CHLORIDE IN NACL 20-0.9 MEQ/L-% IV SOLN: INTRAVENOUS | Status: AC

## 2022-01-23 MED ORDER — THIAMINE HCL 100 MG/ML IJ SOLN
500.0000 mg | Freq: Three times a day (TID) | INTRAVENOUS | Status: DC
  Administered 2022-01-23 – 2022-01-25 (×5): 500 mg via INTRAVENOUS
  Filled 2022-01-23 (×6): qty 5

## 2022-01-23 MED ORDER — POTASSIUM CHLORIDE CRYS ER 20 MEQ PO TBCR
40.0000 meq | EXTENDED_RELEASE_TABLET | Freq: Once | ORAL | Status: AC
  Administered 2022-01-23: 40 meq via ORAL
  Filled 2022-01-23: qty 2

## 2022-01-23 MED ORDER — POTASSIUM CHLORIDE CRYS ER 20 MEQ PO TBCR
20.0000 meq | EXTENDED_RELEASE_TABLET | Freq: Once | ORAL | Status: AC
  Administered 2022-01-23: 20 meq via ORAL
  Filled 2022-01-23: qty 1

## 2022-01-23 NOTE — Progress Notes (Signed)
PROGRESS NOTE  ORPAH HAUSNER TKP:[id number] DOB: [birth year] DOA: 01/17/2022 PCP: Asencion Noble, MD  Brief History:  81 y/o female with history of alcohol dependence, HTN, admitted with C diff colitis. She has been started on oral vancomycin. Also noted to be severely thrombocytopenic, felt to be related to infectious process and ETOH. Monitoring for ETOH withdrawal symptoms.  The patient remains stable without signs of withdrawal.  Unfortunately, patient's platelets continue to trend down requiring 2 units platelets to be given this admission.   Assessment/Plan:   Principal Problem:   C. difficile colitis Active Problems:   Hypokalemia   Depression   Physical deconditioning   Alcohol dependence (HCC)   Thrombocytopenia (HCC)   Seizures (HCC)   Sepsis (HCC)   HTN (hypertension)   Elevated TSH   Protein-calorie malnutrition, severe   Normocytic anemia  Assessment and Plan: * C. difficile colitis- (present on admission) Having 5-6 BM daily for 10 days prior to admission 01/17/22 CT abd--wall thickening distal sigmoid and rectum Stool C diff antigen and toxin positive Started on oral vancomycin Continues to have loose bowel movements, thus far had 3  BM last 24 hours  Normocytic anemia Hemoglobin down to 7.0 Baseline appears to be between 8-9 Will transfuse 1 unit prbc 2/21 since she has had on going bleeding/oozing from IV site Hgb stable since transfusion  Protein-calorie malnutrition, severe- (present on admission) Albumin 2.2 Nutrition consult Liberalize diet Started on boost breeze 3 times daily  Elevated TSH- (present on admission) Euthyroid sick syndrome She does not appear to be on thyroid replacement as outpatient TSH currently 4.5, but this is down from 9.5 on 11/03/21 Normal Free T4 11/06/21 Would repeat in 4 weeks as outpatient  HTN (hypertension)- (present on admission) Continue to hold metoprolol and Spironolactone for now since blood pressures  have been soft    Sepsis ([place])- (present on admission) On admission: Heart rate 101, respiratory rate 30, systolic blood pressure less than 90, platelets 17 secondary to C diff colitis Sepsis order set utilized Procalcitonin 0.20 Hemodynamics stabilizing with IV fluids Continue to monitor Sepsis physiology resolved  Seizures ([place]) Continue Keppra Seizure precautions No seizures this admission   Thrombocytopenia ([place])- (present on admission) Patient is chronically thrombocytopenic, seems like baseline is in 70-80s Admitted with platelets of 17  LDH normal, DIC panel neg, no schistocytes, B12 level 2256 She received a unit of platelets in the ED with transient improvement of PLTs to 39, but back down to 18  Suspect this is related to infectious process as well as alcohol use Platelets down to 15 on 2/21 and patient noted to be oozing around midline IV Transfusing a unit of platelets on 2/21 (2 total for the admission)    Alcohol dependence ([place])- (present on admission) Per caregiver, patient drinks approximately 2 bottles of wine per day Last drink was prior to coming to ED on 2/17 CIWA protocol with ativan Fortunately, she has not required much Ativan. Continue thiamine supplementation  Continue folic acid supplementation Check thiamine level (182) and start on high dose supplementation initially  Physical deconditioning- (present on admission) Seen by PT with recommendations for SNF Discussed with patient daughter and they will consider if they cannot arrange caregivers at home She has been to [facility] in the past  Depression- (present on admission) Continue Lexapro  Hypokalemia- (present on admission) Replete Mag 2.4     Status is: Inpatient Remains inpatient appropriate because: severity of illness  with profound thrombocytopenia                       Family Communication:   no Family at bedside   Consultants:  none   Code Status:   DNR    DVT Prophylaxis:  [place]   Procedures: As Listed in Progress Note Above   Antibiotics: Po vanco 2/19>>        Subjective: Patient denies fevers, chills, headache, chest pain, dyspnea, nausea, vomiting, abdominal pain, dysuria, hematuria, hematochezia, and melena.   Objective: Vitals:   01/22/22 1730 01/22/22 2114 01/23/22 0412 01/23/22 1403  BP: (!) 182/90 (!) 145/93 134/76 123/79  Pulse: 81 75 69 85  Resp: 20   18  Temp:  98.3 F (36.8 C)  98.2 F (36.8 C)  TempSrc:  Oral  Oral  SpO2: 100% 96% 100% 99%  Weight:      Height:        Intake/Output Summary (Last 24 hours) at 01/23/2022 1643 Last data filed at 01/23/2022 1200 Gross per 24 hour  Intake 720 ml  Output --  Net 720 ml   Weight change:  Exam:  General:  Pt is alert, follows commands appropriately, not in acute distress HEENT: No icterus, No thrush, No neck mass, [place]/AT Cardiovascular: RRR, S1/S2, no rubs, no gallops Respiratory: bibasilar rales. No wheeze Abdomen: Soft/+BS, non tender, non distended, no guarding Extremities: No edema, No lymphangitis, No petechiae, No rashes, no synovitis   Data Reviewed: I have personally reviewed following labs and imaging studies Basic Metabolic Panel: Recent Labs  Lab 01/18/22 0217 01/19/22 0943 01/20/22 0417 01/21/22 0404 01/22/22 0521 01/23/22 0939  NA 134* 132* 136 136 135 136  K 3.4* 3.3* 4.0 3.7 3.1* 2.8*  CL 108 107 110 111 108 107  CO2 19* 19* 21* 20* 21* 22  GLUCOSE 74 114* 87 81 76 136*  BUN 18 11 9  6* <5* 6*  CREATININE 1.04* 0.92 1.12* 1.00 1.02* 1.17*  CALCIUM 7.3* 7.5* 7.9* 7.9* 7.7* 8.3*  MG 1.7 1.7 1.7 1.6* 2.4  --   PHOS  --  2.3* 2.1* 2.8 3.3  --    Liver Function Tests: Recent Labs  Lab 01/17/22 1219 01/18/22 0217 01/19/22 0943 01/20/22 0417 01/22/22 0521  AST 18 13* 16  --  17  ALT 14 10 12   --  11  ALKPHOS 123 87 82  --  69  BILITOT 0.3 0.3 0.4  --  0.7  PROT 6.0* 4.4* 4.5*  --  4.5*  ALBUMIN 3.1* 2.2* 2.2* 2.2* 2.3*    No results for input(s): LIPASE, AMYLASE in the last 168 hours. No results for input(s): AMMONIA in the last 168 hours. Coagulation Profile: Recent Labs  Lab 01/18/22 0217 01/21/22 0404  INR 1.2 1.2   CBC: Recent Labs  Lab 01/17/22 1319 01/18/22 0217 01/19/22 0943 01/20/22 0417 01/21/22 0404 01/22/22 0521 01/23/22 0939  WBC 7.0 4.2 4.0 3.8* 3.4* 5.0 5.3  NEUTROABS 5.0 2.8  --   --  2.0  --  3.4  HGB 9.7* 8.0* 8.6* 7.3* 7.0* 9.5* 9.8*  HCT 30.1* 24.8* 27.7* 23.6* 21.9* 27.4* 29.3*  MCV 95.0 94.3 97.2 92.9 94.4 87.5 89.3  PLT 17* 39* 18* 18* 15*   15* 34* 49*   Cardiac Enzymes: No results for input(s): CKTOTAL, CKMB, CKMBINDEX, TROPONINI in the last 168 hours. BNP: Invalid input(s): POCBNP CBG: Recent Labs  Lab 01/22/22 0010  GLUCAP 83   HbA1C: No results  for input(s): HGBA1C in the last 72 hours. Urine analysis:    Component Value Date/Time   COLORURINE AMBER (A) 05/18/2021 1236   APPEARANCEUR TURBID (A) 05/18/2021 1236   LABSPEC 1.029 05/18/2021 1236   PHURINE 5.0 05/18/2021 1236   GLUCOSEU NEGATIVE 05/18/2021 1236   HGBUR LARGE (A) 05/18/2021 1236   BILIRUBINUR NEGATIVE 05/18/2021 1236   KETONESUR 5 (A) 05/18/2021 1236   PROTEINUR 30 (A) 05/18/2021 1236   UROBILINOGEN 0.2 09/08/2014 0300   NITRITE NEGATIVE 05/18/2021 1236   LEUKOCYTESUR MODERATE (A) 05/18/2021 1236   Sepsis Labs: @LABRCNTIP (procalcitonin:4,lacticidven:4) ) Recent Results (from the past 240 hour(s))  Resp Panel by RT-PCR (Flu A&B, Covid) Nasopharyngeal Swab     Status: None   Collection Time: 01/17/22 10:58 PM   Specimen: Nasopharyngeal Swab; Nasopharyngeal(NP) swabs in vial transport medium  Result Value Ref Range Status   SARS Coronavirus 2 by RT PCR NEGATIVE NEGATIVE Final    Comment: (NOTE) SARS-CoV-2 target nucleic acids are NOT DETECTED.  The SARS-CoV-2 RNA is generally detectable in upper respiratory specimens during the acute phase of infection. The lowest concentration  of SARS-CoV-2 viral copies this assay can detect is 138 copies/mL. A negative result does not preclude SARS-Cov-2 infection and should not be used as the sole basis for treatment or other patient management decisions. A negative result may occur with  improper specimen collection/handling, submission of specimen other than nasopharyngeal swab, presence of viral mutation(s) within the areas targeted by this assay, and inadequate number of viral copies(<138 copies/mL). A negative result must be combined with clinical observations, patient history, and epidemiological information. The expected result is Negative.  Fact Sheet for Patients:  EntrepreneurPulse.com.au  Fact Sheet for Healthcare Providers:  IncredibleEmployment.be  This test is no t yet approved or cleared by the [place] FDA and  has been authorized for detection and/or diagnosis of SARS-CoV-2 by FDA under an Emergency Use Authorization (EUA). This EUA will remain  in effect (meaning this test can be used) for the duration of the COVID-19 declaration under Section 564(b)(1) of the Act, 21 U.S.C.section 360bbb-3(b)(1), unless the authorization is terminated  or revoked sooner.       Influenza A by PCR NEGATIVE NEGATIVE Final   Influenza B by PCR NEGATIVE NEGATIVE Final    Comment: (NOTE) The Xpert Xpress SARS-CoV-2/FLU/RSV plus assay is intended as an aid in the diagnosis of influenza from Nasopharyngeal swab specimens and should not be used as a sole basis for treatment. Nasal washings and aspirates are unacceptable for Xpert Xpress SARS-CoV-2/FLU/RSV testing.  Fact Sheet for Patients: EntrepreneurPulse.com.au  Fact Sheet for Healthcare Providers: IncredibleEmployment.be  This test is not yet approved or cleared by the [place] FDA and has been authorized for detection and/or diagnosis of SARS-CoV-2 by FDA under an Emergency Use  Authorization (EUA). This EUA will remain in effect (meaning this test can be used) for the duration of the COVID-19 declaration under Section 564(b)(1) of the Act, 21 U.S.C. section 360bbb-3(b)(1), unless the authorization is terminated or revoked.  Performed at [facility], [address] [place], [place] [address]   Culture, blood (x 2)     Status: None   Collection Time: 01/18/22  2:17 AM   Specimen: Right Antecubital; Blood  Result Value Ref Range Status   Specimen Description RIGHT ANTECUBITAL  Final   Special Requests   Final    BOTTLES DRAWN AEROBIC AND ANAEROBIC Blood Culture adequate volume   Culture  Setup Time NO ORGANISMS SEEN REVIEWED BY  [id number] ANA BOTTLE   Final   Culture   Final    NO GROWTH 5 DAYS Performed at [facility], [address], [place], [place] [address]    Report Status 01/23/2022 FINAL  Final  Culture, blood (x 2)     Status: None   Collection Time: 01/18/22  2:24 AM   Specimen: BLOOD RIGHT HAND  Result Value Ref Range Status   Specimen Description BLOOD RIGHT HAND  Final   Special Requests   Final    BOTTLES DRAWN AEROBIC AND ANAEROBIC Blood Culture adequate volume   Culture   Final    NO GROWTH 5 DAYS Performed at [facility], [address] [place], [place] [address]    Report Status 01/23/2022 FINAL  Final  Gastrointestinal Panel by PCR , Stool     Status: None   Collection Time: 01/18/22 11:30 PM   Specimen: Stool  Result Value Ref Range Status   Campylobacter species NOT DETECTED NOT DETECTED Final   Plesimonas shigelloides NOT DETECTED NOT DETECTED Final   Salmonella species NOT DETECTED NOT DETECTED Final   Yersinia enterocolitica NOT DETECTED NOT DETECTED Final   Vibrio species NOT DETECTED NOT DETECTED Final   Vibrio cholerae NOT DETECTED NOT DETECTED Final   Enteroaggregative E coli (EAEC) NOT DETECTED NOT DETECTED Final   Enteropathogenic E coli (EPEC) NOT DETECTED NOT DETECTED Final   Enterotoxigenic E coli (ETEC) NOT  DETECTED NOT DETECTED Final   Shiga like toxin producing E coli (STEC) NOT DETECTED NOT DETECTED Final   Shigella/Enteroinvasive E coli (EIEC) NOT DETECTED NOT DETECTED Final   Cryptosporidium NOT DETECTED NOT DETECTED Final   Cyclospora cayetanensis NOT DETECTED NOT DETECTED Final   Entamoeba histolytica NOT DETECTED NOT DETECTED Final   Giardia lamblia NOT DETECTED NOT DETECTED Final   Adenovirus F40/41 NOT DETECTED NOT DETECTED Final   Astrovirus NOT DETECTED NOT DETECTED Final   Norovirus GI/GII NOT DETECTED NOT DETECTED Final   Rotavirus A NOT DETECTED NOT DETECTED Final   Sapovirus (I, II, IV, and V) NOT DETECTED NOT DETECTED Final    Comment: Performed at [facility], [address] [place], [place] [address]  C Difficile Quick Screen w PCR reflex     Status: Abnormal   Collection Time: 01/18/22 11:30 PM   Specimen: Stool  Result Value Ref Range Status   C Diff antigen POSITIVE (A) NEGATIVE Final   C Diff toxin POSITIVE (A) NEGATIVE Final   C Diff interpretation Toxin producing C. difficile detected.  Final    Comment: CRITICAL RESULT CALLED TO, READ BACK BY AND VERIFIED WITH:  RIVERS,S 0043 01/19/2022 ACOSTA,A Performed at [facility], [address] [place], [place] [address]      Scheduled Meds:  chlorhexidine  15 mL Mouth Rinse BID   Chlorhexidine Gluconate Cloth  6 each Topical Daily   escitalopram  20 mg Oral q AM   feeding supplement  1 Container Oral TID BM   folic acid  1 mg Oral Daily   Gerhardt's butt cream   Topical BID   levETIRAcetam  500 mg Oral Daily   mouth rinse  15 mL Mouth Rinse q12n4p   multivitamin with minerals  1 tablet Oral Daily   nystatin cream   Topical BID   potassium chloride  20 mEq Oral Once   vancomycin  125 mg Oral QID   Continuous Infusions:  thiamine injection      Procedures/Studies: CT ABDOMEN PELVIS WO CONTRAST  Result Date:  01/17/2022 CLINICAL DATA:  Diarrhea for over 1 week. History of liver disease and kidney  stones. Acute abdominal pain. EXAM: CT ABDOMEN AND PELVIS WITHOUT CONTRAST TECHNIQUE: Multidetector CT imaging of the abdomen and pelvis was performed following the standard protocol without IV contrast. RADIATION DOSE REDUCTION: This exam was performed according to the departmental dose-optimization program which includes automated exposure control, adjustment of the mA and/or kV according to patient size and/or use of iterative reconstruction technique. COMPARISON:  CT scan 11/02/2021 FINDINGS: Lower chest: Mild scarring in the lingula and left lower lobe. Mild chronic nodularity in the lingula is unchanged from 02/29/2020 probably from airway plugging. Descending thoracic aortic atherosclerosis. Hepatobiliary: Diffuse hepatic steatosis. Gallbladder unremarkable. No biliary dilatation. Pancreas: Unremarkable Spleen: Unremarkable Adrenals/Urinary Tract: Both adrenal glands appear unremarkable. Two nonobstructive right renal calculi, the larger in the upper pole measuring 7 mm in short axis. Four nonobstructive left renal calculi, the largest in the lower pole measuring 8 mm in long axis. 2.4 by 2.4 cm hypodense left mid kidney lesion on image 20 series 2, fluid density and favoring cyst. Left renal atrophy and scarring. No hydronephrosis or hydroureter. No ureteral or bladder calculus identified. Stomach/Bowel: Right ileocolic anastomosis without complicating feature. No dilated small bowel. Suspected wall thickening in the sigmoid colon and rectum, with likely semi solid material in the rectum, appearance suspicious for distal colitis. Infectious colitis favored over inflammatory bowel disease or ischemia based on the distribution and appearance. Vascular/Lymphatic: Atherosclerosis is present, including aortoiliac atherosclerotic disease. Reproductive: Uterus absent.  Adnexa unremarkable. Other: No supplemental non-categorized findings. Musculoskeletal: Multiple ventral hernias are visible on image 64 series 6.  One of these contains a small margin of the transverse colon as on image 41 series 2. No surrounding edema or specific complicating feature is currently identified. Dextroconvex thoracolumbar scoliosis with substantial rotary component. Old healed bilateral rib fractures. Deformity in the lower sacrum and coccyx compatible with remote fracture, with some new bandlike sclerosis transversely in the lower S2 vertebra compared to 02/29/2020 suspicious for a component of sacral insufficiency fracture. Chronic anterior wedging of the T11 vertebral body. Grade 1 degenerative retrolisthesis at L3-4 and grade 1 degenerative anterolisthesis at L4-5. The scoliosis in combination with spondylosis and degenerative disc disease contribute to right foraminal impingement at L3-4 and L4-5, and left foraminal impingement at L1-2, L4-5, and L5-S1. IMPRESSION: 1. Wall thickening in the distal sigmoid colon and rectum, distribution and appearance favoring infectious colitis. This probably accounts for the patient's diarrheal process. 2. Patient has multiple ventral hernias containing adipose tissue. One of these also contains a small margin of the transverse colon although no complicating feature is observed. 3. Suspected insufficiency fracture of the sacrum superimposed on chronic deformity of the sacrum and coccyx. 4. Other imaging findings of potential clinical significance: Scarring in the lingula and left lower lobe. Diffuse hepatic steatosis. Bilateral nonobstructive nephrolithiasis. Left renal scarring and atrophy. Fluid density left mid kidney lesion, likely a cyst. Aortic Atherosclerosis (ICD10-I70.0). Lumbar spondylosis, scoliosis, and degenerative disc disease contributing to multilevel foraminal impingement. Electronically Signed   By: Van Clines M.D.   On: 01/17/2022 17:02   US Venous Img Upper Uni Left (DVT)  Result Date: 01/18/2022 CLINICAL DATA:  Arm and hand swelling. EXAM: LEFT UPPER EXTREMITY VENOUS  DOPPLER ULTRASOUND TECHNIQUE: Gray-scale sonography with graded compression, as well as color Doppler and duplex ultrasound were performed to evaluate the upper extremity deep venous system from the level of the subclavian vein and including the jugular, axillary, basilic,  radial, ulnar and upper cephalic vein. Spectral Doppler was utilized to evaluate flow at rest and with distal augmentation maneuvers. COMPARISON:  None. FINDINGS: Contralateral Subclavian Vein: Unable to evaluate due to patient intolerance. Internal Jugular Vein: No evidence of thrombus. Normal compressibility, respiratory phasicity and response to augmentation. Subclavian Vein: No visible thrombus with limited evaluation. Axillary Vein: No evidence of thrombus. Normal compressibility, respiratory phasicity and response to augmentation. Cephalic Vein: No visible thrombus in visible portions. Limited views due to bandaging. Basilic Vein: No visible thrombus in visible portions. Limited views due to bandaging. Brachial Veins: No visible thrombus in visible portions. Limited views due to bandaging. Radial Veins: No visible thrombus with limited evaluation due to patient intolerance. Ulnar Veins: No visible thrombus with limited evaluation due to patient intolerance. Other: Bruising and nonspecific subcutaneous edema throughout the arm. IMPRESSION: 1. Limited study due to bandaging and patient intolerance without visible thrombus. 2. Extensive bruising and nonspecific subcutaneous edema throughout the arm. Electronically Signed   By: Margaretha Sheffield M.D.   On: 01/18/2022 12:37   DG Chest Port 1 View  Result Date: 01/17/2022 CLINICAL DATA:  cough EXAM: PORTABLE CHEST 1 VIEW COMPARISON:  Chest radiograph 11/05/2021. FINDINGS: No consolidation. Small pulmonary nodules better characterized on recent CT chest. Postsurgical change in the upper right chest. No visible pleural effusions or pneumothorax. Chronic mild elevation left hemidiaphragm. Remote  left rib fractures. Osteopenia. Polyarticular degenerative change. Lower thoracic dextrocurvature. IMPRESSION: No evidence of acute cardiopulmonary disease. Electronically Signed   By: Margaretha Sheffield M.D.   On: 01/17/2022 12:24    Orson Eva, DO  [facility]  If 7PM-7AM, please contact night-coverage [email or web address] Password [facility] 01/23/2022, 4:43 PM   LOS: 6 days

## 2022-01-23 NOTE — Assessment & Plan Note (Signed)
Replete Mag 2.4

## 2022-01-23 NOTE — NC FL2 (Signed)
[place] LEVEL OF CARE SCREENING TOOL     IDENTIFICATION  Patient Name: [name] Birthdate: 10-Jul-1941 Sex: female Admission Date (Current Location): 01/17/2022  [facility] and [place] Number:  [employer] and Address:  [place] [address] [place]      Provider Number: [id number]  Attending Physician Name and Address:  Orson Eva, MD  Relative Name and Phone Number:  [facility] "[facility]" (Daughter)   [phone] (Mobile)    Current Level of Care: Hospital Recommended Level of Care: [place] Prior Approval Number:    Date Approved/Denied:   PASRR Number: [id number] 01/23/22-02/22/22  Discharge Plan: SNF    Current Diagnoses: Patient Active Problem List   Diagnosis Date Noted   Normocytic anemia 01/21/2022   Protein-calorie malnutrition, severe 01/20/2022   Elevated TSH 01/19/2022   C. 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 05/19/2021   Cerebral atrophy ([place]) 05/19/2021   Generalized weakness 05/19/2021   Rhabdomyolysis 85/27/7824   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[place]) 05/19/2021   Atherosclerosis of aorta ([place]) 05/19/2021   Multiple skin tears 05/19/2021   Multiple Bruising sites 05/19/2021   Poor historian 05/19/2021   Acute renal injury ([place]) 05/19/2021   Transaminitis 05/19/2021   Bilirubinemia 05/19/2021   Hypoproteinemia ([place])  05/19/2021   Elevated troponin 05/19/2021   Vitamin B12 deficiency 05/19/2021   Macrocytic anemia 05/19/2021   TSH elevation 05/19/2021   Pyuria 05/19/2021   Hematuria 05/19/2021   Dehydration 05/19/2021   Cerebral atrophy ([place]) 05/19/2021   Degenerative disc disease, cervical 05/19/2021   Bilateral Periorbital ecchymosis 05/19/2021   Myocardial injury    Elevated CK    Fall 05/18/2021   Multiple closed fractures of ribs of left side 05/18/2021   Alteration consciousness 12/10/2020   Gait abnormality 12/10/2020   Sepsis ([place]) 02/29/2020   Acute lower UTI 02/29/2020   Unsteady gait 01/10/2020   Seizures ([place]) 01/10/2020   Osteoarthritis of left knee    Physical deconditioning 12/19/2019   Alcohol dependence ([place]) 12/19/2019   Thrombocytopenia ([place]) 12/19/2019   Subarachnoid hemorrhage ([place]) 12/19/2019   SAH (subarachnoid hemorrhage) ([place])    Transaminasemia    Subarachnoid bleed ([place]) 12/18/2019   Hypokalemia 12/18/2019   Depression    Hyperbilirubinemia    Hyponatremia    Macrocytic anemia    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[place])    Arthritis 05/10/2019   Diarrhea 10/13/2017   Recurrent kidney stones 10/31/2014   Left ureteral stone 09/08/2014   Right ureteral stone 09/08/2014   Infection of urinary tract 09/08/2014   Pain in joint, shoulder region 08/09/2012   Muscle weakness (generalized) 08/09/2012   Impingement syndrome of left shoulder 08/09/2012    Orientation RESPIRATION BLADDER Height & Weight     Self, Place  Normal Incontinent Weight: 84 lb 14 oz (38.5 kg) Height:  5' (152.4 cm)  BEHAVIORAL SYMPTOMS/MOOD NEUROLOGICAL BOWEL NUTRITION STATUS      Incontinent Diet (regular)  AMBULATORY STATUS  COMMUNICATION OF NEEDS Skin   Limited Assist Verbally PU Stage and Appropriate Care (skin tear: arm, left, posterior, upper. Mositure associated skin damage: groin, right, left)                       Personal Care Assistance Level of Assistance  Bathing, Feeding,  Dressing Bathing Assistance: Limited assistance   Dressing Assistance: Limited assistance     Functional Limitations Info  Sight, Hearing, Speech Sight Info: Adequate Hearing Info: Adequate Speech Info: Adequate    SPECIAL CARE FACTORS FREQUENCY  PT (By licensed PT)     PT Frequency: 5x/week              Contractures Contractures Info: Not present    Additional Factors Info  Code Status, Allergies, Psychotropic, Isolation Precautions Code Status Info: DNR Allergies Info: Clindamycin, Clindamycin/lincomycin, Doxycycline, Doxycycline hyclate Psychotropic Info: Lexapro   Isolation Precautions Info: Enteric precautions     Current Medications (01/23/2022):  This is the current hospital active medication list Current Facility-Administered Medications  Medication Dose Route Frequency Provider Last Rate Last Admin   acetaminophen (TYLENOL) tablet 650 mg  650 mg Oral Q6H PRN Zierle-Ghosh, Asia B, DO       Or   acetaminophen (TYLENOL) suppository 650 mg  650 mg Rectal Q6H PRN Zierle-Ghosh, Asia B, DO       chlorhexidine (PERIDEX) 0.12 % solution 15 mL  15 mL Mouth Rinse BID Tat, David, MD   15 mL at 01/23/22 7741   Chlorhexidine Gluconate Cloth 2 % PADS 6 each  6 each Topical Daily Kathie Dike, MD   6 each at 01/23/22 0834   escitalopram (LEXAPRO) tablet 20 mg  20 mg Oral q AM Zierle-Ghosh, Asia B, DO   20 mg at 01/23/22 2878   feeding supplement (BOOST / RESOURCE BREEZE) liquid 1 Container  1 Container Oral TID BM Kathie Dike, MD   1 Container at 67/67/20 9470   folic acid (FOLVITE) tablet 1 mg  1 mg Oral Daily Zierle-Ghosh, Asia B, DO   1 mg at 01/23/22 9628   Gerhardt's butt cream   Topical BID Kathie Dike, MD   Given at 01/23/22 0834   levETIRAcetam (KEPPRA) tablet 500 mg  500 mg Oral Daily Zierle-Ghosh, Asia B, DO   500 mg at 01/23/22 3662   MEDLINE mouth rinse  15 mL Mouth Rinse q12n4p Tat, Shanon Brow, MD       multivitamin with minerals tablet 1 tablet  1 tablet  Oral Daily Zierle-Ghosh, Asia B, DO   1 tablet at 01/23/22 9476   nystatin cream (MYCOSTATIN)   Topical BID Kathie Dike, MD   Given at 01/23/22 0835   ondansetron (ZOFRAN) tablet 4 mg  4 mg Oral Q6H PRN Zierle-Ghosh, Asia B, DO       Or   ondansetron (ZOFRAN) injection 4 mg  4 mg Intravenous Q6H PRN Zierle-Ghosh, Asia B, DO       oxyCODONE (Oxy IR/ROXICODONE) immediate release tablet 5 mg  5 mg Oral Q4H PRN Zierle-Ghosh, Asia B, DO       vancomycin (VANCOCIN) capsule 125 mg  125 mg Oral QID Kathie Dike, MD   125 mg at 01/23/22 5465     Discharge Medications: Please see discharge summary for a list of discharge medications.  Relevant Imaging Results:  Relevant Lab Results:   Additional Information SS# [id number]. Patient vaccinated for COVID on 12/27/19 and 01/24/20  Ihor Gully, LCSW

## 2022-01-23 NOTE — Progress Notes (Signed)
Physical Therapy Treatment Patient Details Name: [name] MRN: [id number] DOB: [birth year] Today's Date: 01/23/2022   History of Present Illness [name] is a 81 y.o. female with medical history significant of with history of alcohol dependence, alcoholic liver disease, lung cancer, degenerative disc disease, depression, GERD, seizure, subarachnoid bleed, chronic diarrhea, and more presents ED with chief complaint of diarrhea.  Unfortunately during her time in the ED she became agitated and was given half milligram of Ativan.  Patient is altered as a result of the Ativan, not able to answer questions.  She wakes up briefly, if negative tone mumbling's, and then goes back to sleep.  Chart review reveals that she had come in for chief complaint of diarrhea.  She has had diarrhea for about 5 days, 4 episodes a day.  At that time she was denying fever and cough.  She also denied vomiting.  Caregiver and daughter reported that patient was an alcoholic and drinks every day.  Labs are done that showed a critical platelet of 17,000.  Provider offered admission, patient refused.  ED provider was able to convince patient to stay for a unit of platelets platelets had to be ordered from [facility], the extended wait made patient very agitated.  This when she was given the Ativan.  Now she is not able to provide any meaningful history.    PT Comments    Patient demonstrates increased BLE strength for completing sit to stands, transfers and ambulation in room without loss of balance.  Patient demonstrates fair/good return for completing BLE ROM/strengthening exercises while seated at bedside with occasional verbal cueing, becomes anxious and slightly agitated however, when given instructions.  Patient requested to go back to bed with after therapy with visitor in room.  Patient will benefit from continued skilled physical therapy in hospital and recommended venue below to increase strength, balance,  endurance for safe ADLs and gait.     Recommendations for follow up therapy are one component of a multi-disciplinary discharge planning process, led by the attending physician.  Recommendations may be updated based on patient status, additional functional criteria and insurance authorization.  Follow Up Recommendations  Skilled nursing-short term rehab (<3 hours/day)     Assistance Recommended at Discharge Intermittent Supervision/Assistance  Patient can return home with the following A lot of help with walking and/or transfers;A little help with bathing/dressing/bathroom;Assistance with cooking/housework;Help with stairs or ramp for entrance   Equipment Recommendations  Rolling walker (2 wheels)    Recommendations for Other Services       Precautions / Restrictions Precautions Precautions: Fall Restrictions Weight Bearing Restrictions: No     Mobility  Bed Mobility Overal bed mobility: Needs Assistance Bed Mobility: Supine to Sit, Sit to Supine     Supine to sit: Min guard, HOB elevated Sit to supine: Min guard   General bed mobility comments: slow labored movement for supine to sitting, sit to supine, has difficulty scooting self to [facility]    Transfers Overall transfer level: Needs assistance Equipment used: Rolling walker (2 wheels) Transfers: Sit to/from Stand Sit to Stand: Min guard   Step pivot transfers: Min guard       General transfer comment: slightly labored movement without loss of balance    Ambulation/Gait Ambulation/Gait assistance: Min guard, Min assist Gait Distance (Feet): 25 Feet Assistive device: Rolling walker (2 wheels) Gait Pattern/deviations: Decreased step length - right, Decreased step length - left, Decreased stride length Gait velocity: decreased     General Gait  Details: demonstrates increased endurance/distance for ambulation with slightly labored cadence without loss of balance, limited mostly due to fatigue   Stairs              Wheelchair Mobility    Modified Rankin (Stroke Patients Only)       Balance Overall balance assessment: Needs assistance Sitting-balance support: Feet supported, No upper extremity supported Sitting balance-Leahy Scale: Good Sitting balance - Comments: seated at EOB   Standing balance support: During functional activity, Bilateral upper extremity supported Standing balance-Leahy Scale: Fair Standing balance comment: using RW                            Cognition Arousal/Alertness: Awake/alert Behavior During Therapy: Anxious, Agitated Overall Cognitive Status: History of cognitive impairments - at baseline                                          Exercises General Exercises - Lower Extremity Ankle Circles/Pumps: Seated, AROM, Strengthening, 10 reps, Both Long Arc Quad: Seated, AROM, Strengthening, Both, 10 reps Hip Flexion/Marching: Seated, AROM, Strengthening, Both, 10 reps    General Comments        Pertinent Vitals/Pain Pain Assessment Pain Assessment: No/denies pain    Home Living                          Prior Function            PT Goals (current goals can now be found in the care plan section) Acute Rehab PT Goals Patient Stated Goal: return home with caregivers to assist PT Goal Formulation: With patient Time For Goal Achievement: 02/04/22 Potential to Achieve Goals: Good Progress towards PT goals: Progressing toward goals    Frequency    Min 3X/week      PT Plan Current plan remains appropriate    Co-evaluation              AM-PAC PT "6 Clicks" Mobility   Outcome Measure  Help needed turning from your back to your side while in a flat bed without using bedrails?: None Help needed moving from lying on your back to sitting on the side of a flat bed without using bedrails?: A Little Help needed moving to and from a bed to a chair (including a wheelchair)?: A Little Help needed standing up  from a chair using your arms (e.g., wheelchair or bedside chair)?: A Little Help needed to walk in hospital room?: A Little Help needed climbing 3-5 steps with a railing? : A Lot 6 Click Score: 18    End of Session   Activity Tolerance: Patient tolerated treatment well;Patient limited by fatigue Patient left: in bed;with call bell/phone within reach;with family/visitor present Nurse Communication: Mobility status PT Visit Diagnosis: Unsteadiness on feet (R26.81);Other abnormalities of gait and mobility (R26.89);Muscle weakness (generalized) (M62.81)     Time: 6659-9357 PT Time Calculation (min) (ACUTE ONLY): 22 min  Charges:  $Therapeutic Exercise: 8-22 mins $Therapeutic Activity: 8-22 mins                     2:59 PM, 01/23/22 Lonell Grandchild, MPT Physical Therapist with [facility] 336 [phone] office [phone] mobile phone

## 2022-01-23 NOTE — TOC Progression Note (Signed)
Transition of Care [facility]) - Progression Note    Patient Details  Name: [name] MRN: [id number] Date of Birth: Mar 28, 1941  Transition of Care [facility]) CM/SW Contact  Ihor Gully, LCSW Phone Number: 01/23/2022, 11:51 AM  Clinical Narrative:    Patient's PASRR is [id number] E 01/23/22-02/22/22. Bed offers from [place], [place] left in a VM message for daughter, [name].    Expected Discharge Plan: Home/Self Care Barriers to Discharge: Continued Medical Work up  Expected Discharge Plan and Services Expected Discharge Plan: Home/Self Care       Living arrangements for the past 2 months: Single Family Home                                       Social Determinants of Health (SDOH) Interventions    Readmission Risk Interventions Readmission Risk Prevention Plan 11/06/2021  Transportation Screening Complete  Home Care Screening Complete  Medication Review (RN CM) Complete  Some recent data might be hidden

## 2022-01-23 NOTE — Progress Notes (Signed)
Physical Therapy Treatment Patient Details Name: JALEN OBERRY MRN: [id number] DOB: 08-31-1941 Today's Date: 01/23/2022   History of Present Illness [name] is a 81 y.o. female with medical history significant of with history of alcohol dependence, alcoholic liver disease, lung cancer, degenerative disc disease, depression, GERD, seizure, subarachnoid bleed, chronic diarrhea, and more presents ED with chief complaint of diarrhea.  Unfortunately during her time in the ED she became agitated and was given half milligram of Ativan.  Patient is altered as a result of the Ativan, not able to answer questions.  She wakes up briefly, if negative tone mumbling's, and then goes back to sleep.  Chart review reveals that she had come in for chief complaint of diarrhea.  She has had diarrhea for about 5 days, 4 episodes a day.  At that time she was denying fever and cough.  She also denied vomiting.  Caregiver and daughter reported that patient was an alcoholic and drinks every day.  Labs are done that showed a critical platelet of 17,000.  Provider offered admission, patient refused.  ED provider was able to convince patient to stay for a unit of platelets platelets had to be ordered from [facility], the extended wait made patient very agitated.  This when she was given the Ativan.  Now she is not able to provide any meaningful history.    PT Comments    Pt lying in bed with sitter present.  Agreeable to ambulation stating she just did some exercises.  Pt able to increase her distance to 75 feet using RW and cues for posturing.  Pt had difficult time getting out of bed asking therapist to "pull her up".  Attempted logroll, however still wanting assistance to pull up into sitting.  Pt asked to go use restroom when returned from ambulation and had accident on the way to restroom.  Pt remained on commode with sitter when therapist left.  Pt required max assist for hygiene.  Sitter to assist back into bed  after floor dried.      Recommendations for follow up therapy are one component of a multi-disciplinary discharge planning process, led by the attending physician.  Recommendations may be updated based on patient status, additional functional criteria and insurance authorization.  Follow Up Recommendations  Skilled nursing-short term rehab (<3 hours/day)     Assistance Recommended at Discharge Intermittent Supervision/Assistance  Patient can return home with the following A lot of help with walking and/or transfers;A little help with bathing/dressing/bathroom;Assistance with cooking/housework;Help with stairs or ramp for entrance   Equipment Recommendations  Rolling walker (2 wheels)    Recommendations for Other Services       Precautions / Restrictions Precautions Precautions: Fall Restrictions Weight Bearing Restrictions: No     Mobility  Bed Mobility   Bed Mobility: Supine to Sit     Supine to sit: HOB elevated, Min assist     General bed mobility comments: slow labored movement for supine to sitting, sit to supine, has difficulty scooting self to [facility]; attempted to teach logroll    Transfers                        Ambulation/Gait   Gait Distance (Feet): 75 Feet               Stairs             Wheelchair Mobility    Modified Rankin (Stroke Patients Only)  Prior Function            PT Goals (current goals can now be found in the care plan section) Acute Rehab PT Goals Patient Stated Goal: return home with caregivers to assist PT Goal Formulation: With patient Time For Goal Achievement: 02/04/22 Potential to Achieve Goals: Good Progress towards PT goals: Progressing toward goals    Frequency    Min 3X/week      PT Plan Current plan remains appropriate       AM-PAC PT "6 Clicks" Mobility   Outcome Measure  Help needed turning from your back to your side while in a flat bed without using bedrails?:  None Help needed moving from lying on your back to sitting on the side of a flat bed without using bedrails?: A Little Help needed moving to and from a bed to a chair (including a wheelchair)?: A Little Help needed standing up from a chair using your arms (e.g., wheelchair or bedside chair)?: A Little Help needed to walk in hospital room?: A Little Help needed climbing 3-5 steps with a railing? : A Lot 6 Click Score: 18    End of Session Equipment Utilized During Treatment: Gait belt Activity Tolerance: Patient tolerated treatment well;Patient limited by fatigue Patient left: with nursing/sitter in room;Other (comment)   PT Visit Diagnosis: Unsteadiness on feet (R26.81);Other abnormalities of gait and mobility (R26.89);Muscle weakness (generalized) (M62.81)     Time: 7564-3329 PT Time Calculation (min) (ACUTE ONLY): 30 min  Charges:  $Gait Training: 8-22 mins                   .  Teena Irani, PTA/CLT, Lissa Morales [phone]    Roseanne Reno B 01/23/2022, 4:32 PM

## 2022-01-24 DIAGNOSIS — A0472 Enterocolitis due to Clostridium difficile, not specified as recurrent: Secondary | ICD-10-CM | POA: Diagnosis not present

## 2022-01-24 DIAGNOSIS — A419 Sepsis, unspecified organism: Secondary | ICD-10-CM | POA: Diagnosis not present

## 2022-01-24 DIAGNOSIS — F1020 Alcohol dependence, uncomplicated: Secondary | ICD-10-CM | POA: Diagnosis not present

## 2022-01-24 LAB — CBC WITH DIFFERENTIAL/PLATELET
Abs Immature Granulocytes: 0.04 10*3/uL (ref 0.00–0.07)
Basophils Absolute: 0 10*3/uL (ref 0.0–0.1)
Basophils Relative: 1 %
Eosinophils Absolute: 0.1 10*3/uL (ref 0.0–0.5)
Eosinophils Relative: 1 %
HCT: 27.6 % — ABNORMAL LOW (ref 36.0–46.0)
Hemoglobin: 8.8 g/dL — ABNORMAL LOW (ref 12.0–15.0)
Immature Granulocytes: 1 %
Lymphocytes Relative: 32 %
Lymphs Abs: 1.4 10*3/uL (ref 0.7–4.0)
MCH: 30.2 pg (ref 26.0–34.0)
MCHC: 31.9 g/dL (ref 30.0–36.0)
MCV: 94.8 fL (ref 80.0–100.0)
Monocytes Absolute: 0.5 10*3/uL (ref 0.1–1.0)
Monocytes Relative: 12 %
Neutro Abs: 2.3 10*3/uL (ref 1.7–7.7)
Neutrophils Relative %: 53 %
Platelets: 55 10*3/uL — ABNORMAL LOW (ref 150–400)
RBC: 2.91 MIL/uL — ABNORMAL LOW (ref 3.87–5.11)
RDW: 17.5 % — ABNORMAL HIGH (ref 11.5–15.5)
WBC: 4.3 10*3/uL (ref 4.0–10.5)
nRBC: 0 % (ref 0.0–0.2)

## 2022-01-24 LAB — BASIC METABOLIC PANEL
Anion gap: 5 (ref 5–15)
BUN: 7 mg/dL — ABNORMAL LOW (ref 8–23)
CO2: 20 mmol/L — ABNORMAL LOW (ref 22–32)
Calcium: 8.2 mg/dL — ABNORMAL LOW (ref 8.9–10.3)
Chloride: 116 mmol/L — ABNORMAL HIGH (ref 98–111)
Creatinine, Ser: 0.89 mg/dL (ref 0.44–1.00)
GFR, Estimated: >60 mL/min (ref >60)
Glucose, Bld: 68 mg/dL — ABNORMAL LOW (ref 70–99)
Potassium: 4.5 mmol/L (ref 3.5–5.1)
Sodium: 141 mmol/L (ref 135–145)

## 2022-01-24 LAB — MAGNESIUM: Magnesium: 1.7 mg/dL (ref 1.7–2.4)

## 2022-01-24 MED ORDER — METOPROLOL SUCCINATE ER 25 MG PO TB24
25.0000 mg | ORAL_TABLET | Freq: Every day | ORAL | Status: DC
  Administered 2022-01-24 – 2022-01-25 (×2): 25 mg via ORAL
  Filled 2022-01-24 (×2): qty 1

## 2022-01-24 MED ORDER — METOPROLOL SUCCINATE ER 25 MG PO TB24: 25.0000 mg | ORAL_TABLET | Freq: Every day | ORAL | 1 refills | Status: AC

## 2022-01-24 MED ORDER — VANCOMYCIN HCL 125 MG PO CAPS: 125.0000 mg | ORAL_CAPSULE | Freq: Four times a day (QID) | ORAL | 0 refills | Status: DC

## 2022-01-24 NOTE — TOC Transition Note (Addendum)
Transition of Care [facility]) - CM/SW Discharge Note   Patient Details  Name: [name] MRN: [id number] Date of Birth: 03-31-1941  [facility] of [facility]) CM/SW Contact:  Iona Beard, [place] Phone Number: 01/24/2022, 10:39 AM   Clinical Narrative:    CSW spoke to [facility] rep who states they are unable to accept pt back for [facility] services at this time. CSW reached out to [place] with [facility] who states they can accept for [facility] services. CSW requested that MD place [facility] PT orders. TOC signing off.   [facility] unable to accept pts referral for [facility] services. CSW reached out to [facility] with [facility] who accepts pts referral. TOC signing off.   Final next level of care: [place] Barriers to Discharge: Barriers Resolved   Patient Goals and CMS Choice Patient states their goals for this hospitalization and ongoing recovery are:: Home with [facility] CMS Medicare.gov Compare Post Acute Care list provided to:: Patient Choice offered to / list presented to : Patient, Adult Children  Discharge Placement                       Discharge Plan and Services                          HH Arranged: PT [facility] Agency: [place] Date [place]: 01/24/22   Representative spoke with at [place]: [place] ([place]) Interventions     Readmission Risk Interventions Readmission Risk Prevention Plan 11/06/2021  Transportation Screening Complete  Home Care Screening Complete  Medication Review (RN CM) Complete  Some recent data might be hidden

## 2022-01-24 NOTE — Progress Notes (Signed)
Pt request prn oxyCODONE (Oxy IR/ROXICODONE) immediate release tablet 5 mg    Pain in hands rated at a 5 out of 10 will continue to monitor. Pt sitting in bed with lights on looking in the direction of the tv. Pt able to make needs known. Denies any other needs at this time. Call bell in reach. Bed in lowest position, and bed alarm on. Pt did not need to potty at this time. Placed pt cell phone on charger in reach

## 2022-01-24 NOTE — Discharge Summary (Signed)
Physician Discharge Summary   Patient: [name] MRN: [id number] DOB: July 02, 1941  Admit date:     01/17/2022  Discharge date: 01/24/22  Discharge Physician: Shanon Brow Toshiro Hanken   PCP: Asencion Noble, MD   Recommendations at discharge:   Please follow up with primary care provider within 1-2 weeks  Please repeat BMP and CBC in one week     Hospital Course: 81 y/o female with history of alcohol dependence, HTN, admitted with C diff colitis. She has been started on oral vancomycin. Also noted to be severely thrombocytopenic, felt to be related to infectious process and ETOH. Monitoring for ETOH withdrawal symptoms.  The patient remains stable without signs of withdrawal.  Unfortunately, patient's platelets continue to trend down requiring 2 units platelets to be given this admission.  Fortunately, sepsis physiology resolved and her platelets ultimately stabilized and recovered.  She refused SNF and will d/c home with HHPT.    Assessment and Plan: * C. difficile colitis- (present on admission) Having 5-6 BM daily for 10 days prior to admission 01/17/22 CT abd--wall thickening distal sigmoid and rectum Stool C diff antigen and toxin positive Started on oral vancomycin Continues to have loose bowel movements but continues to improve D/c home with vanco po x 5 more days to complete 10 day course  Sepsis due to undetermined organism ([place]) Sepsis physiology resolved at time of d/c  Normocytic anemia Hemoglobin down to 7.0 Baseline appears to be between 8-9 Will transfuse 1 unit prbc 2/21 since she has had on going bleeding/oozing from IV site Hgb stable since transfusion  Protein-calorie malnutrition, severe- (present on admission) Albumin 2.2 Nutrition consult Liberalize diet Started on boost breeze 3 times daily  Elevated TSH- (present on admission) Euthyroid sick syndrome She does not appear to be on thyroid replacement as outpatient TSH currently 4.5, but this is down from 9.5 on  11/03/21 Normal Free T4 11/06/21 Would repeat in 4 weeks as outpatient  HTN (hypertension)- (present on admission) Continue to hold metoprolol and Spironolactone for now since blood pressures have been soft Restart metoprolol succinate at lower dose at time of d/c    Sepsis ([place])- (present on admission) On admission: Heart rate 101, respiratory rate 30, systolic blood pressure less than 90, platelets 17 secondary to C diff colitis Sepsis order set utilized Procalcitonin 0.20 Hemodynamics stabilizing with IV fluids Continue to monitor Sepsis physiology resolved  Seizures ([place]) Continue Keppra Seizure precautions No seizures this admission   Thrombocytopenia ([place])- (present on admission) Patient is chronically thrombocytopenic, seems like baseline is in 70-80s Admitted with platelets of 17  LDH normal, DIC panel neg, no schistocytes, B12 level 2256 She received a unit of platelets in the ED with transient improvement of PLTs to 39, but back down to 18  Suspect this is related to infectious process as well as alcohol use Platelets down to 15 on 2/21 and patient noted to be oozing around midline IV Transfusing a unit of platelets on 2/21 (2 total for the admission) Platelets slowly improving--55K on  Day of d/c    Alcohol dependence ([place])- (present on admission) Per caregiver, patient drinks approximately 2 bottles of wine per day Last drink was prior to coming to ED on 2/17 CIWA protocol with ativan Fortunately, she has not required much Ativan. Continue thiamine supplementation  Continue folic acid supplementation Check thiamine level (182) and start on high dose supplementation initially  Physical deconditioning- (present on admission) Seen by PT with recommendations for SNF Discussed with patient daughter and  they will consider if they cannot arrange caregivers at home She has been to [facility] in the past  Depression- (present on admission) Continue  Lexapro  Hypokalemia- (present on admission) Replete Mag 2.4           Consultants: none Procedures performed: none  Disposition: Home;  refuses SNF Diet recommendation: regular   DISCHARGE MEDICATION: Allergies as of 01/24/2022       Reactions   Clindamycin Rash   Clindamycin/lincomycin Rash   Doxycycline Rash   Doxycycline Hyclate Rash        Medication List     STOP taking these medications    spironolactone 25 MG tablet Commonly known as: ALDACTONE       TAKE these medications    Biotin 2.5 MG Tabs Take 1 tablet by mouth daily.   CALCIUM + D3 PO Take 1 tablet by mouth daily with breakfast.   cycloSPORINE 0.05 % ophthalmic emulsion Commonly known as: RESTASIS Place 1 drop into both eyes 2 (two) times daily.   escitalopram 20 MG tablet Commonly known as: LEXAPRO Take 1 tablet (20 mg total) by mouth in the morning.   folic acid 1 MG tablet Commonly known as: FOLVITE Take 1 tablet (1 mg total) by mouth daily.   Keppra 500 MG tablet Generic drug: levETIRAcetam Take 1 tablet by mouth daily.   metoprolol succinate 25 MG 24 hr tablet Commonly known as: TOPROL-XL Take 1 tablet (25 mg total) by mouth daily. What changed:  medication strength how much to take when to take this   multivitamin with minerals Tabs tablet Take 1 tablet by mouth daily.   potassium citrate 10 MEQ (1080 MG) SR tablet Commonly known as: UROCIT-K Take 10 mEq by mouth daily.   thiamine 100 MG tablet Take 100 mg by mouth daily.   vancomycin 125 MG capsule Commonly known as: VANCOCIN Take 1 capsule (125 mg total) by mouth 4 (four) times daily.         Discharge Exam: Filed Weights   01/19/22 2257  Weight: 38.5 kg   HEENT:  [place]/AT, No thrush, no icterus CV:  RRR, no rub, no S3, no S4 Lung:  CTA, no wheeze, no rhonchi Abd:  soft/+BS, NT Ext:  No edema, no lymphangitis, no synovitis, no rash   Condition at discharge: stable  The results of significant  diagnostics from this hospitalization (including imaging, microbiology, ancillary and laboratory) are listed below for reference.   Imaging Studies: CT ABDOMEN PELVIS WO CONTRAST  Result Date: 01/17/2022 CLINICAL DATA:  Diarrhea for over 1 week. History of liver disease and kidney stones. Acute abdominal pain. EXAM: CT ABDOMEN AND PELVIS WITHOUT CONTRAST TECHNIQUE: Multidetector CT imaging of the abdomen and pelvis was performed following the standard protocol without IV contrast. RADIATION DOSE REDUCTION: This exam was performed according to the departmental dose-optimization program which includes automated exposure control, adjustment of the mA and/or kV according to patient size and/or use of iterative reconstruction technique. COMPARISON:  CT scan 11/02/2021 FINDINGS: Lower chest: Mild scarring in the lingula and left lower lobe. Mild chronic nodularity in the lingula is unchanged from 02/29/2020 probably from airway plugging. Descending thoracic aortic atherosclerosis. Hepatobiliary: Diffuse hepatic steatosis. Gallbladder unremarkable. No biliary dilatation. Pancreas: Unremarkable Spleen: Unremarkable Adrenals/Urinary Tract: Both adrenal glands appear unremarkable. Two nonobstructive right renal calculi, the larger in the upper pole measuring 7 mm in short axis. Four nonobstructive left renal calculi, the largest in the lower pole measuring 8 mm in long axis. 2.4 by  2.4 cm hypodense left mid kidney lesion on image 20 series 2, fluid density and favoring cyst. Left renal atrophy and scarring. No hydronephrosis or hydroureter. No ureteral or bladder calculus identified. Stomach/Bowel: Right ileocolic anastomosis without complicating feature. No dilated small bowel. Suspected wall thickening in the sigmoid colon and rectum, with likely semi solid material in the rectum, appearance suspicious for distal colitis. Infectious colitis favored over inflammatory bowel disease or ischemia based on the distribution  and appearance. Vascular/Lymphatic: Atherosclerosis is present, including aortoiliac atherosclerotic disease. Reproductive: Uterus absent.  Adnexa unremarkable. Other: No supplemental non-categorized findings. Musculoskeletal: Multiple ventral hernias are visible on image 64 series 6. One of these contains a small margin of the transverse colon as on image 41 series 2. No surrounding edema or specific complicating feature is currently identified. Dextroconvex thoracolumbar scoliosis with substantial rotary component. Old healed bilateral rib fractures. Deformity in the lower sacrum and coccyx compatible with remote fracture, with some new bandlike sclerosis transversely in the lower S2 vertebra compared to 02/29/2020 suspicious for a component of sacral insufficiency fracture. Chronic anterior wedging of the T11 vertebral body. Grade 1 degenerative retrolisthesis at L3-4 and grade 1 degenerative anterolisthesis at L4-5. The scoliosis in combination with spondylosis and degenerative disc disease contribute to right foraminal impingement at L3-4 and L4-5, and left foraminal impingement at L1-2, L4-5, and L5-S1. IMPRESSION: 1. Wall thickening in the distal sigmoid colon and rectum, distribution and appearance favoring infectious colitis. This probably accounts for the patient's diarrheal process. 2. Patient has multiple ventral hernias containing adipose tissue. One of these also contains a small margin of the transverse colon although no complicating feature is observed. 3. Suspected insufficiency fracture of the sacrum superimposed on chronic deformity of the sacrum and coccyx. 4. Other imaging findings of potential clinical significance: Scarring in the lingula and left lower lobe. Diffuse hepatic steatosis. Bilateral nonobstructive nephrolithiasis. Left renal scarring and atrophy. Fluid density left mid kidney lesion, likely a cyst. Aortic Atherosclerosis (ICD10-I70.0). Lumbar spondylosis, scoliosis, and  degenerative disc disease contributing to multilevel foraminal impingement. Electronically Signed   By: Van Clines M.D.   On: 01/17/2022 17:02   US Venous Img Upper Uni Left (DVT)  Result Date: 01/18/2022 CLINICAL DATA:  Arm and hand swelling. EXAM: LEFT UPPER EXTREMITY VENOUS DOPPLER ULTRASOUND TECHNIQUE: Gray-scale sonography with graded compression, as well as color Doppler and duplex ultrasound were performed to evaluate the upper extremity deep venous system from the level of the subclavian vein and including the jugular, axillary, basilic, radial, ulnar and upper cephalic vein. Spectral Doppler was utilized to evaluate flow at rest and with distal augmentation maneuvers. COMPARISON:  None. FINDINGS: Contralateral Subclavian Vein: Unable to evaluate due to patient intolerance. Internal Jugular Vein: No evidence of thrombus. Normal compressibility, respiratory phasicity and response to augmentation. Subclavian Vein: No visible thrombus with limited evaluation. Axillary Vein: No evidence of thrombus. Normal compressibility, respiratory phasicity and response to augmentation. Cephalic Vein: No visible thrombus in visible portions. Limited views due to bandaging. Basilic Vein: No visible thrombus in visible portions. Limited views due to bandaging. Brachial Veins: No visible thrombus in visible portions. Limited views due to bandaging. Radial Veins: No visible thrombus with limited evaluation due to patient intolerance. Ulnar Veins: No visible thrombus with limited evaluation due to patient intolerance. Other: Bruising and nonspecific subcutaneous edema throughout the arm. IMPRESSION: 1. Limited study due to bandaging and patient intolerance without visible thrombus. 2. Extensive bruising and nonspecific subcutaneous edema throughout the arm. Electronically Signed  By: Margaretha Sheffield M.D.   On: 01/18/2022 12:37   DG Chest Port 1 View  Result Date: 01/17/2022 CLINICAL DATA:  cough EXAM: PORTABLE  CHEST 1 VIEW COMPARISON:  Chest radiograph 11/05/2021. FINDINGS: No consolidation. Small pulmonary nodules better characterized on recent CT chest. Postsurgical change in the upper right chest. No visible pleural effusions or pneumothorax. Chronic mild elevation left hemidiaphragm. Remote left rib fractures. Osteopenia. Polyarticular degenerative change. Lower thoracic dextrocurvature. IMPRESSION: No evidence of acute cardiopulmonary disease. Electronically Signed   By: Margaretha Sheffield M.D.   On: 01/17/2022 12:24    Microbiology: Results for orders placed or performed during the hospital encounter of 01/17/22  Resp Panel by RT-PCR (Flu A&B, Covid) Nasopharyngeal Swab     Status: None   Collection Time: 01/17/22 10:58 PM   Specimen: Nasopharyngeal Swab; Nasopharyngeal(NP) swabs in vial transport medium  Result Value Ref Range Status   SARS Coronavirus 2 by RT PCR NEGATIVE NEGATIVE Final    Comment: (NOTE) SARS-CoV-2 target nucleic acids are NOT DETECTED.  The SARS-CoV-2 RNA is generally detectable in upper respiratory specimens during the acute phase of infection. The lowest concentration of SARS-CoV-2 viral copies this assay can detect is 138 copies/mL. A negative result does not preclude SARS-Cov-2 infection and should not be used as the sole basis for treatment or other patient management decisions. A negative result may occur with  improper specimen collection/handling, submission of specimen other than nasopharyngeal swab, presence of viral mutation(s) within the areas targeted by this assay, and inadequate number of viral copies(<138 copies/mL). A negative result must be combined with clinical observations, patient history, and epidemiological information. The expected result is Negative.  Fact Sheet for Patients:  EntrepreneurPulse.com.au  Fact Sheet for Healthcare Providers:  IncredibleEmployment.be  This test is no t yet approved or cleared  by the [place] FDA and  has been authorized for detection and/or diagnosis of SARS-CoV-2 by FDA under an Emergency Use Authorization (EUA). This EUA will remain  in effect (meaning this test can be used) for the duration of the COVID-19 declaration under Section 564(b)(1) of the Act, 21 U.S.C.section 360bbb-3(b)(1), unless the authorization is terminated  or revoked sooner.       Influenza A by PCR NEGATIVE NEGATIVE Final   Influenza B by PCR NEGATIVE NEGATIVE Final    Comment: (NOTE) The Xpert Xpress SARS-CoV-2/FLU/RSV plus assay is intended as an aid in the diagnosis of influenza from Nasopharyngeal swab specimens and should not be used as a sole basis for treatment. Nasal washings and aspirates are unacceptable for Xpert Xpress SARS-CoV-2/FLU/RSV testing.  Fact Sheet for Patients: EntrepreneurPulse.com.au  Fact Sheet for Healthcare Providers: IncredibleEmployment.be  This test is not yet approved or cleared by the [place] FDA and has been authorized for detection and/or diagnosis of SARS-CoV-2 by FDA under an Emergency Use Authorization (EUA). This EUA will remain in effect (meaning this test can be used) for the duration of the COVID-19 declaration under Section 564(b)(1) of the Act, 21 U.S.C. section 360bbb-3(b)(1), unless the authorization is terminated or revoked.  Performed at [facility], [address] [place], [place] [address]   Culture, blood (x 2)     Status: None   Collection Time: 01/18/22  2:17 AM   Specimen: Right Antecubital; Blood  Result Value Ref Range Status   Specimen Description RIGHT ANTECUBITAL  Final   Special Requests   Final    BOTTLES DRAWN AEROBIC AND ANAEROBIC Blood Culture adequate volume   Culture  Setup  Time NO ORGANISMS SEEN REVIEWED BY [id number] ANA BOTTLE   Final   Culture   Final    NO GROWTH 5 DAYS Performed at [facility], [address] [place], [place] [address]    Report Status  01/23/2022 FINAL  Final  Culture, blood (x 2)     Status: None   Collection Time: 01/18/22  2:24 AM   Specimen: BLOOD RIGHT HAND  Result Value Ref Range Status   Specimen Description BLOOD RIGHT HAND  Final   Special Requests   Final    BOTTLES DRAWN AEROBIC AND ANAEROBIC Blood Culture adequate volume   Culture   Final    NO GROWTH 5 DAYS Performed at [facility], [address] [place], [place] [address]    Report Status 01/23/2022 FINAL  Final  Gastrointestinal Panel by PCR , Stool     Status: None   Collection Time: 01/18/22 11:30 PM   Specimen: Stool  Result Value Ref Range Status   Campylobacter species NOT DETECTED NOT DETECTED Final   Plesimonas shigelloides NOT DETECTED NOT DETECTED Final   Salmonella species NOT DETECTED NOT DETECTED Final   Yersinia enterocolitica NOT DETECTED NOT DETECTED Final   Vibrio species NOT DETECTED NOT DETECTED Final   Vibrio cholerae NOT DETECTED NOT DETECTED Final   Enteroaggregative E coli (EAEC) NOT DETECTED NOT DETECTED Final   Enteropathogenic E coli (EPEC) NOT DETECTED NOT DETECTED Final   Enterotoxigenic E coli (ETEC) NOT DETECTED NOT DETECTED Final   Shiga like toxin producing E coli (STEC) NOT DETECTED NOT DETECTED Final   Shigella/Enteroinvasive E coli (EIEC) NOT DETECTED NOT DETECTED Final   Cryptosporidium NOT DETECTED NOT DETECTED Final   Cyclospora cayetanensis NOT DETECTED NOT DETECTED Final   Entamoeba histolytica NOT DETECTED NOT DETECTED Final   Giardia lamblia NOT DETECTED NOT DETECTED Final   Adenovirus F40/41 NOT DETECTED NOT DETECTED Final   Astrovirus NOT DETECTED NOT DETECTED Final   Norovirus GI/GII NOT DETECTED NOT DETECTED Final   Rotavirus A NOT DETECTED NOT DETECTED Final   Sapovirus (I, II, IV, and V) NOT DETECTED NOT DETECTED Final    Comment: Performed at [facility], [address] [place], [place] [address]  C Difficile Quick Screen w PCR reflex     Status: Abnormal   Collection Time: 01/18/22  11:30 PM   Specimen: Stool  Result Value Ref Range Status   C Diff antigen POSITIVE (A) NEGATIVE Final   C Diff toxin POSITIVE (A) NEGATIVE Final   C Diff interpretation Toxin producing C. difficile detected.  Final    Comment: CRITICAL RESULT CALLED TO, READ BACK BY AND VERIFIED WITH:  RIVERS,S 0043 01/19/2022 ACOSTA,A Performed at [facility], [address], [place],  [address]     Labs: CBC: Recent Labs  Lab 01/17/22 1319 01/18/22 0217 01/19/22 1017 01/20/22 0417 01/21/22 0404 01/22/22 0521 01/23/22 0939 01/24/22 0508  WBC 7.0 4.2   < > 3.8* 3.4* 5.0 5.3 4.3  NEUTROABS 5.0 2.8  --   --  2.0  --  3.4 2.3  HGB 9.7* 8.0*   < > 7.3* 7.0* 9.5* 9.8* 8.8*  HCT 30.1* 24.8*   < > 23.6* 21.9* 27.4* 29.3* 27.6*  MCV 95.0 94.3   < > 92.9 94.4 87.5 89.3 94.8  PLT 17* 39*   < > 18* 15*   15* 34* 49* 55*   < > = values in this interval not displayed.   Basic Metabolic Panel: Recent Labs  Lab 01/19/22 0943 01/20/22 0417 01/21/22 0404 01/22/22 0521 01/23/22 0939 01/24/22 0508  NA 132* 136 136 135 136 141  K 3.3* 4.0 3.7 3.1* 2.8* 4.5  CL 107 110 111 108 107 116*  CO2 19* 21* 20* 21* 22 20*  GLUCOSE 114* 87 81 76 136* 68*  BUN 11 9 6* <5* 6* 7*  CREATININE 0.92 1.12* 1.00 1.02* 1.17* 0.89  CALCIUM 7.5* 7.9* 7.9* 7.7* 8.3* 8.2*  MG 1.7 1.7 1.6* 2.4  --  1.7  PHOS 2.3* 2.1* 2.8 3.3  --   --    Liver Function Tests: Recent Labs  Lab 01/17/22 1219 01/18/22 0217 01/19/22 0943 01/20/22 0417 01/22/22 0521  AST 18 13* 16  --  17  ALT 14 10 12   --  11  ALKPHOS 123 87 82  --  69  BILITOT 0.3 0.3 0.4  --  0.7  PROT 6.0* 4.4* 4.5*  --  4.5*  ALBUMIN 3.1* 2.2* 2.2* 2.2* 2.3*   CBG: Recent Labs  Lab 01/22/22 0010  GLUCAP 83    Discharge time spent: greater than 30 minutes.  Signed: Orson Eva, MD [facility] 01/24/2022

## 2022-01-24 NOTE — TOC Progression Note (Signed)
Transition of Care [facility]) - Progression Note    Patient Details  Name: [name] MRN: [id number] Date of Birth: [birth year]  Transition of Care [facility] CM/SW [place], [place] Phone Number: 01/24/2022, 8:51 AM  Clinical Narrative:    CSW reached out to pt daughter Ms. Mee Hives to speak about the bed offers pt currently has. Ms. Mee Hives states that after speaking with other hospital staff and her mother she feels SNF is not going to be an option. Pt does not want to go to rehab at this time and would like to go home. Ms. Mee Hives states that pts caregiver is aware and will be starting services back at discharge. Ms. Wheeler requests that [facility] services be ordered and D/C and she would like whatever services the MD feels pt would benefit from. Pt has been active with [facility] in the past. CSW to reach to [facility] to see if they can accept pts referral. TOC to follow.   Expected Discharge Plan: Home/Self Care Barriers to Discharge: Continued Medical Work up  Expected Discharge Plan and Services Expected Discharge Plan: Home/Self Care       Living arrangements for the past 2 months: Single Family Home                                       Social Determinants of Health (SDOH) Interventions    Readmission Risk Interventions Readmission Risk Prevention Plan 11/06/2021  Transportation Screening Complete  Home Care Screening Complete  Medication Review (RN CM) Complete  Some recent data might be hidden

## 2022-01-24 NOTE — Care Management Important Message (Signed)
Important Message  Patient Details  Name: [name] MRN: [id number] Date of Birth: Jul 05, 1941   Medicare Important Message Given:  Yes (spoke with by telephone at [phone], no copy needed)     Tommy Medal 01/24/2022, 11:56 AM

## 2022-01-24 NOTE — Assessment & Plan Note (Signed)
Sepsis physiology resolved at time of d/c

## 2022-01-25 DIAGNOSIS — A419 Sepsis, unspecified organism: Secondary | ICD-10-CM | POA: Diagnosis not present

## 2022-01-25 DIAGNOSIS — R5381 Other malaise: Secondary | ICD-10-CM | POA: Diagnosis not present

## 2022-01-25 DIAGNOSIS — A0472 Enterocolitis due to Clostridium difficile, not specified as recurrent: Secondary | ICD-10-CM | POA: Diagnosis not present

## 2022-01-25 DIAGNOSIS — F1020 Alcohol dependence, uncomplicated: Secondary | ICD-10-CM | POA: Diagnosis not present

## 2022-01-25 LAB — CBC WITH DIFFERENTIAL/PLATELET
Abs Immature Granulocytes: 0.03 10*3/uL (ref 0.00–0.07)
Basophils Absolute: 0.1 10*3/uL (ref 0.0–0.1)
Basophils Relative: 1 %
Eosinophils Absolute: 0.1 10*3/uL (ref 0.0–0.5)
Eosinophils Relative: 2 %
HCT: 28.4 % — ABNORMAL LOW (ref 36.0–46.0)
Hemoglobin: 9.4 g/dL — ABNORMAL LOW (ref 12.0–15.0)
Immature Granulocytes: 1 %
Lymphocytes Relative: 32 %
Lymphs Abs: 1.9 10*3/uL (ref 0.7–4.0)
MCH: 30.4 pg (ref 26.0–34.0)
MCHC: 33.1 g/dL (ref 30.0–36.0)
MCV: 91.9 fL (ref 80.0–100.0)
Monocytes Absolute: 0.6 10*3/uL (ref 0.1–1.0)
Monocytes Relative: 9 %
Neutro Abs: 3.4 10*3/uL (ref 1.7–7.7)
Neutrophils Relative %: 55 %
Platelets: 71 10*3/uL — ABNORMAL LOW (ref 150–400)
RBC: 3.09 MIL/uL — ABNORMAL LOW (ref 3.87–5.11)
RDW: 16.9 % — ABNORMAL HIGH (ref 11.5–15.5)
WBC: 6.1 10*3/uL (ref 4.0–10.5)
nRBC: 0 % (ref 0.0–0.2)

## 2022-01-25 LAB — BASIC METABOLIC PANEL
Anion gap: 6 (ref 5–15)
BUN: 8 mg/dL (ref 8–23)
CO2: 23 mmol/L (ref 22–32)
Calcium: 8.4 mg/dL — ABNORMAL LOW (ref 8.9–10.3)
Chloride: 110 mmol/L (ref 98–111)
Creatinine, Ser: 1.03 mg/dL — ABNORMAL HIGH (ref 0.44–1.00)
GFR, Estimated: 55 mL/min — ABNORMAL LOW (ref >60)
Glucose, Bld: 80 mg/dL (ref 70–99)
Potassium: 4 mmol/L (ref 3.5–5.1)
Sodium: 139 mmol/L (ref 135–145)

## 2022-01-25 NOTE — Discharge Summary (Signed)
Physician Discharge Summary   Patient: [name] MRN: [id number] DOB: 02-24-1941  Admit date:     01/17/2022  Discharge date: 01/25/22  Discharge Physician: Shanon Brow Alfonzia Woolum   PCP: Asencion Noble, MD   Recommendations at discharge:   Please follow up with primary care provider within 1-2 weeks  Please repeat BMP and CBC in one week       Hospital Course: 81 y/o female with history of alcohol dependence, HTN, admitted with C diff colitis. She has been started on oral vancomycin. Also noted to be severely thrombocytopenic, felt to be related to infectious process and ETOH. Monitoring for ETOH withdrawal symptoms.  The patient remains stable without signs of withdrawal.  Unfortunately, patient's platelets continue to trend down requiring 2 units platelets to be given this admission.  Fortunately, sepsis physiology resolved and her platelets ultimately stabilized and recovered.  She refused SNF and will d/c home with HHPT.   Pt normally has private pay care giver during day time at home.  Assessment and Plan: * C. difficile colitis- (present on admission) Having 5-6 BM daily for 10 days prior to admission 01/17/22 CT abd--wall thickening distal sigmoid and rectum Stool C diff antigen and toxin positive Started on oral vancomycin Continues to have loose bowel movements but continues to improve D/c home with vanco po x 5 more days to complete 10 day course  Thrombocytopenia ([place])- (present on admission) Patient is chronically thrombocytopenic, seems like baseline is in 70-80s Admitted with platelets of 17  LDH normal, DIC panel neg, no schistocytes, B12 level 2256 She received a unit of platelets in the ED with transient improvement of PLTs to 39, but back down to 18  Suspect this is related to infectious process as well as alcohol use Platelets down to 15 on 2/21 and patient noted to be oozing around midline IV Transfusing a unit of platelets on 2/21 (2 total for the admission) Platelets  slowly improving--71K on  Day of d/c    Sepsis due to undetermined organism ([place]) Sepsis physiology resolved at time of d/c  Normocytic anemia Hemoglobin down to 7.0 Baseline appears to be between 8-9 Will transfuse 1 unit prbc 2/21 since she has had on going bleeding/oozing from IV site Hgb stable since transfusion  Protein-calorie malnutrition, severe- (present on admission) Albumin 2.2 Nutrition consult Liberalize diet Started on boost breeze 3 times daily  Elevated TSH- (present on admission) Euthyroid sick syndrome She does not appear to be on thyroid replacement as outpatient TSH currently 4.5, but this is down from 9.5 on 11/03/21 Normal Free T4 11/06/21 Would repeat in 4 weeks as outpatient  HTN (hypertension)- (present on admission) Continue to hold metoprolol and Spironolactone for now since blood pressures have been soft Restart metoprolol succinate at lower dose at time of d/c    Sepsis ([place])- (present on admission) On admission: Heart rate 101, respiratory rate 30, systolic blood pressure less than 90, platelets 17 secondary to C diff colitis Sepsis order set utilized Procalcitonin 0.20 Hemodynamics stabilizing with IV fluids Continue to monitor Sepsis physiology resolved  Seizures ([place]) Continue Keppra Seizure precautions No seizures this admission   Alcohol dependence ([place])- (present on admission) Per caregiver, patient drinks approximately 2 bottles of wine per day Last drink was prior to coming to ED on 2/17 CIWA protocol with ativan Fortunately, she has not required much Ativan. Continue thiamine supplementation  Continue folic acid supplementation Check thiamine level (182) and start on high dose supplementation initially  Physical deconditioning- (present  on admission) Seen by PT with recommendations for SNF Discussed with patient daughter and they will consider if they cannot arrange caregivers at home She has been to [facility] in the  past  Depression- (present on admission) Continue Lexapro  Hypokalemia- (present on admission) Replete Mag 2.4          Consultants: none Procedures performed: none  Disposition: Home--refuses SNF Diet recommendation:  Regular diet  DISCHARGE MEDICATION: Allergies as of 01/25/2022       Reactions   Clindamycin Rash   Clindamycin/lincomycin Rash   Doxycycline Rash   Doxycycline Hyclate Rash        Medication List     STOP taking these medications    spironolactone 25 MG tablet Commonly known as: ALDACTONE       TAKE these medications    Biotin 2.5 MG Tabs Take 1 tablet by mouth daily.   CALCIUM + D3 PO Take 1 tablet by mouth daily with breakfast.   cycloSPORINE 0.05 % ophthalmic emulsion Commonly known as: RESTASIS Place 1 drop into both eyes 2 (two) times daily.   escitalopram 20 MG tablet Commonly known as: LEXAPRO Take 1 tablet (20 mg total) by mouth in the morning.   folic acid 1 MG tablet Commonly known as: FOLVITE Take 1 tablet (1 mg total) by mouth daily.   Keppra 500 MG tablet Generic drug: levETIRAcetam Take 1 tablet by mouth daily.   metoprolol succinate 25 MG 24 hr tablet Commonly known as: TOPROL-XL Take 1 tablet (25 mg total) by mouth daily. What changed:  medication strength how much to take when to take this   multivitamin with minerals Tabs tablet Take 1 tablet by mouth daily.   potassium citrate 10 MEQ (1080 MG) SR tablet Commonly known as: UROCIT-K Take 10 mEq by mouth daily.   thiamine 100 MG tablet Take 100 mg by mouth daily.   vancomycin 125 MG capsule Commonly known as: VANCOCIN Take 1 capsule (125 mg total) by mouth 4 (four) times daily.         Discharge Exam: Filed Weights   01/19/22 2257  Weight: 38.5 kg   HEENT:  [place]/AT, No thrush, no icterus CV:  RRR, no rub, no S3, no S4 Lung:  CTA, no wheeze, no rhonchi Abd:  soft/+BS, NT Ext:  No edema, no lymphangitis, no synovitis, no  rash   Condition at discharge: stable  The results of significant diagnostics from this hospitalization (including imaging, microbiology, ancillary and laboratory) are listed below for reference.   Imaging Studies: CT ABDOMEN PELVIS WO CONTRAST  Result Date: 01/17/2022 CLINICAL DATA:  Diarrhea for over 1 week. History of liver disease and kidney stones. Acute abdominal pain. EXAM: CT ABDOMEN AND PELVIS WITHOUT CONTRAST TECHNIQUE: Multidetector CT imaging of the abdomen and pelvis was performed following the standard protocol without IV contrast. RADIATION DOSE REDUCTION: This exam was performed according to the departmental dose-optimization program which includes automated exposure control, adjustment of the mA and/or kV according to patient size and/or use of iterative reconstruction technique. COMPARISON:  CT scan 11/02/2021 FINDINGS: Lower chest: Mild scarring in the lingula and left lower lobe. Mild chronic nodularity in the lingula is unchanged from 02/29/2020 probably from airway plugging. Descending thoracic aortic atherosclerosis. Hepatobiliary: Diffuse hepatic steatosis. Gallbladder unremarkable. No biliary dilatation. Pancreas: Unremarkable Spleen: Unremarkable Adrenals/Urinary Tract: Both adrenal glands appear unremarkable. Two nonobstructive right renal calculi, the larger in the upper pole measuring 7 mm in short axis. Four nonobstructive left renal calculi, the largest  in the lower pole measuring 8 mm in long axis. 2.4 by 2.4 cm hypodense left mid kidney lesion on image 20 series 2, fluid density and favoring cyst. Left renal atrophy and scarring. No hydronephrosis or hydroureter. No ureteral or bladder calculus identified. Stomach/Bowel: Right ileocolic anastomosis without complicating feature. No dilated small bowel. Suspected wall thickening in the sigmoid colon and rectum, with likely semi solid material in the rectum, appearance suspicious for distal colitis. Infectious colitis favored  over inflammatory bowel disease or ischemia based on the distribution and appearance. Vascular/Lymphatic: Atherosclerosis is present, including aortoiliac atherosclerotic disease. Reproductive: Uterus absent.  Adnexa unremarkable. Other: No supplemental non-categorized findings. Musculoskeletal: Multiple ventral hernias are visible on image 64 series 6. One of these contains a small margin of the transverse colon as on image 41 series 2. No surrounding edema or specific complicating feature is currently identified. Dextroconvex thoracolumbar scoliosis with substantial rotary component. Old healed bilateral rib fractures. Deformity in the lower sacrum and coccyx compatible with remote fracture, with some new bandlike sclerosis transversely in the lower S2 vertebra compared to 02/29/2020 suspicious for a component of sacral insufficiency fracture. Chronic anterior wedging of the T11 vertebral body. Grade 1 degenerative retrolisthesis at L3-4 and grade 1 degenerative anterolisthesis at L4-5. The scoliosis in combination with spondylosis and degenerative disc disease contribute to right foraminal impingement at L3-4 and L4-5, and left foraminal impingement at L1-2, L4-5, and L5-S1. IMPRESSION: 1. Wall thickening in the distal sigmoid colon and rectum, distribution and appearance favoring infectious colitis. This probably accounts for the patient's diarrheal process. 2. Patient has multiple ventral hernias containing adipose tissue. One of these also contains a small margin of the transverse colon although no complicating feature is observed. 3. Suspected insufficiency fracture of the sacrum superimposed on chronic deformity of the sacrum and coccyx. 4. Other imaging findings of potential clinical significance: Scarring in the lingula and left lower lobe. Diffuse hepatic steatosis. Bilateral nonobstructive nephrolithiasis. Left renal scarring and atrophy. Fluid density left mid kidney lesion, likely a cyst. Aortic  Atherosclerosis (ICD10-I70.0). Lumbar spondylosis, scoliosis, and degenerative disc disease contributing to multilevel foraminal impingement. Electronically Signed   By: Van Clines M.D.   On: 01/17/2022 17:02   US Venous Img Upper Uni Left (DVT)  Result Date: 01/18/2022 CLINICAL DATA:  Arm and hand swelling. EXAM: LEFT UPPER EXTREMITY VENOUS DOPPLER ULTRASOUND TECHNIQUE: Gray-scale sonography with graded compression, as well as color Doppler and duplex ultrasound were performed to evaluate the upper extremity deep venous system from the level of the subclavian vein and including the jugular, axillary, basilic, radial, ulnar and upper cephalic vein. Spectral Doppler was utilized to evaluate flow at rest and with distal augmentation maneuvers. COMPARISON:  None. FINDINGS: Contralateral Subclavian Vein: Unable to evaluate due to patient intolerance. Internal Jugular Vein: No evidence of thrombus. Normal compressibility, respiratory phasicity and response to augmentation. Subclavian Vein: No visible thrombus with limited evaluation. Axillary Vein: No evidence of thrombus. Normal compressibility, respiratory phasicity and response to augmentation. Cephalic Vein: No visible thrombus in visible portions. Limited views due to bandaging. Basilic Vein: No visible thrombus in visible portions. Limited views due to bandaging. Brachial Veins: No visible thrombus in visible portions. Limited views due to bandaging. Radial Veins: No visible thrombus with limited evaluation due to patient intolerance. Ulnar Veins: No visible thrombus with limited evaluation due to patient intolerance. Other: Bruising and nonspecific subcutaneous edema throughout the arm. IMPRESSION: 1. Limited study due to bandaging and patient intolerance without visible thrombus. 2. Extensive  bruising and nonspecific subcutaneous edema throughout the arm. Electronically Signed   By: Margaretha Sheffield M.D.   On: 01/18/2022 12:37   DG Chest Port 1  View  Result Date: 01/17/2022 CLINICAL DATA:  cough EXAM: PORTABLE CHEST 1 VIEW COMPARISON:  Chest radiograph 11/05/2021. FINDINGS: No consolidation. Small pulmonary nodules better characterized on recent CT chest. Postsurgical change in the upper right chest. No visible pleural effusions or pneumothorax. Chronic mild elevation left hemidiaphragm. Remote left rib fractures. Osteopenia. Polyarticular degenerative change. Lower thoracic dextrocurvature. IMPRESSION: No evidence of acute cardiopulmonary disease. Electronically Signed   By: Margaretha Sheffield M.D.   On: 01/17/2022 12:24    Microbiology: Results for orders placed or performed during the hospital encounter of 01/17/22  Resp Panel by RT-PCR (Flu A&B, Covid) Nasopharyngeal Swab     Status: None   Collection Time: 01/17/22 10:58 PM   Specimen: Nasopharyngeal Swab; Nasopharyngeal(NP) swabs in vial transport medium  Result Value Ref Range Status   SARS Coronavirus 2 by RT PCR NEGATIVE NEGATIVE Final    Comment: (NOTE) SARS-CoV-2 target nucleic acids are NOT DETECTED.  The SARS-CoV-2 RNA is generally detectable in upper respiratory specimens during the acute phase of infection. The lowest concentration of SARS-CoV-2 viral copies this assay can detect is 138 copies/mL. A negative result does not preclude SARS-Cov-2 infection and should not be used as the sole basis for treatment or other patient management decisions. A negative result may occur with  improper specimen collection/handling, submission of specimen other than nasopharyngeal swab, presence of viral mutation(s) within the areas targeted by this assay, and inadequate number of viral copies(<138 copies/mL). A negative result must be combined with clinical observations, patient history, and epidemiological information. The expected result is Negative.  Fact Sheet for Patients:  EntrepreneurPulse.com.au  Fact Sheet for Healthcare Providers:   IncredibleEmployment.be  This test is no t yet approved or cleared by the [place] FDA and  has been authorized for detection and/or diagnosis of SARS-CoV-2 by FDA under an Emergency Use Authorization (EUA). This EUA will remain  in effect (meaning this test can be used) for the duration of the COVID-19 declaration under Section 564(b)(1) of the Act, 21 U.S.C.section 360bbb-3(b)(1), unless the authorization is terminated  or revoked sooner.       Influenza A by PCR NEGATIVE NEGATIVE Final   Influenza B by PCR NEGATIVE NEGATIVE Final    Comment: (NOTE) The Xpert Xpress SARS-CoV-2/FLU/RSV plus assay is intended as an aid in the diagnosis of influenza from Nasopharyngeal swab specimens and should not be used as a sole basis for treatment. Nasal washings and aspirates are unacceptable for Xpert Xpress SARS-CoV-2/FLU/RSV testing.  Fact Sheet for Patients: EntrepreneurPulse.com.au  Fact Sheet for Healthcare Providers: IncredibleEmployment.be  This test is not yet approved or cleared by the [place] FDA and has been authorized for detection and/or diagnosis of SARS-CoV-2 by FDA under an Emergency Use Authorization (EUA). This EUA will remain in effect (meaning this test can be used) for the duration of the COVID-19 declaration under Section 564(b)(1) of the Act, 21 U.S.C. section 360bbb-3(b)(1), unless the authorization is terminated or revoked.  Performed at [facility], [address] [place], [place] [address]   Culture, blood (x 2)     Status: None   Collection Time: 01/18/22  2:17 AM   Specimen: Right Antecubital; Blood  Result Value Ref Range Status   Specimen Description RIGHT ANTECUBITAL  Final   Special Requests   Final    BOTTLES DRAWN  AEROBIC AND ANAEROBIC Blood Culture adequate volume   Culture  Setup Time NO ORGANISMS SEEN REVIEWED BY [id number] ANA BOTTLE   Final   Culture   Final    NO GROWTH 5  DAYS Performed at [facility], [address], [place], [place] [address]    Report Status 01/23/2022 FINAL  Final  Culture, blood (x 2)     Status: None   Collection Time: 01/18/22  2:24 AM   Specimen: BLOOD RIGHT HAND  Result Value Ref Range Status   Specimen Description BLOOD RIGHT HAND  Final   Special Requests   Final    BOTTLES DRAWN AEROBIC AND ANAEROBIC Blood Culture adequate volume   Culture   Final    NO GROWTH 5 DAYS Performed at [facility], [address], [place], [place] [address]    Report Status 01/23/2022 FINAL  Final  Gastrointestinal Panel by PCR , Stool     Status: None   Collection Time: 01/18/22 11:30 PM   Specimen: Stool  Result Value Ref Range Status   Campylobacter species NOT DETECTED NOT DETECTED Final   Plesimonas shigelloides NOT DETECTED NOT DETECTED Final   Salmonella species NOT DETECTED NOT DETECTED Final   Yersinia enterocolitica NOT DETECTED NOT DETECTED Final   Vibrio species NOT DETECTED NOT DETECTED Final   Vibrio cholerae NOT DETECTED NOT DETECTED Final   Enteroaggregative E coli (EAEC) NOT DETECTED NOT DETECTED Final   Enteropathogenic E coli (EPEC) NOT DETECTED NOT DETECTED Final   Enterotoxigenic E coli (ETEC) NOT DETECTED NOT DETECTED Final   Shiga like toxin producing E coli (STEC) NOT DETECTED NOT DETECTED Final   Shigella/Enteroinvasive E coli (EIEC) NOT DETECTED NOT DETECTED Final   Cryptosporidium NOT DETECTED NOT DETECTED Final   Cyclospora cayetanensis NOT DETECTED NOT DETECTED Final   Entamoeba histolytica NOT DETECTED NOT DETECTED Final   Giardia lamblia NOT DETECTED NOT DETECTED Final   Adenovirus F40/41 NOT DETECTED NOT DETECTED Final   Astrovirus NOT DETECTED NOT DETECTED Final   Norovirus GI/GII NOT DETECTED NOT DETECTED Final   Rotavirus A NOT DETECTED NOT DETECTED Final   Sapovirus (I, II, IV, and V) NOT DETECTED NOT DETECTED Final    Comment: Performed at [facility], [address] [place], [place]  [address]  C Difficile Quick Screen w PCR reflex     Status: Abnormal   Collection Time: 01/18/22 11:30 PM   Specimen: Stool  Result Value Ref Range Status   C Diff antigen POSITIVE (A) NEGATIVE Final   C Diff toxin POSITIVE (A) NEGATIVE Final   C Diff interpretation Toxin producing C. difficile detected.  Final    Comment: CRITICAL RESULT CALLED TO, READ BACK BY AND VERIFIED WITH:  RIVERS,S 0043 01/19/2022 ACOSTA,A Performed at [facility], [address], [place], [place] [address]     Labs: CBC: Recent Labs  Lab 01/21/22 0404 01/22/22 0521 01/23/22 0939 01/24/22 0508 01/25/22 0427  WBC 3.4* 5.0 5.3 4.3 6.1  NEUTROABS 2.0  --  3.4 2.3 3.4  HGB 7.0* 9.5* 9.8* 8.8* 9.4*  HCT 21.9* 27.4* 29.3* 27.6* 28.4*  MCV 94.4 87.5 89.3 94.8 91.9  PLT 15*   15* 34* 49* 55* 71*   Basic Metabolic Panel: Recent Labs  Lab 01/19/22 0943 01/20/22 0417 01/21/22 0404 01/22/22 0521 01/23/22 0939 01/24/22 0508 01/25/22 0427  NA 132* 136 136 135 136 141 139  K 3.3* 4.0 3.7 3.1* 2.8* 4.5 4.0  CL 107 110 111 108 107 116* 110  CO2 19* 21* 20* 21* 22 20* 23  GLUCOSE 114* 87 81 76 136* 68* 80  BUN 11 9 6* <5* 6* 7* 8  CREATININE 0.92 1.12* 1.00 1.02* 1.17* 0.89 1.03*  CALCIUM 7.5* 7.9* 7.9* 7.7* 8.3* 8.2* 8.4*  MG 1.7 1.7 1.6* 2.4  --  1.7  --   PHOS 2.3* 2.1* 2.8 3.3  --   --   --    Liver Function Tests: Recent Labs  Lab 01/19/22 0943 01/20/22 0417 01/22/22 0521  AST 16  --  17  ALT 12  --  11  ALKPHOS 82  --  69  BILITOT 0.4  --  0.7  PROT 4.5*  --  4.5*  ALBUMIN 2.2* 2.2* 2.3*   CBG: Recent Labs  Lab 01/22/22 0010  GLUCAP 83    Discharge time spent: greater than 30 minutes.  Signed: Orson Eva, MD [facility] 01/25/2022

## 2022-01-28 DIAGNOSIS — A0472 Enterocolitis due to Clostridium difficile, not specified as recurrent: Secondary | ICD-10-CM | POA: Diagnosis not present

## 2022-01-28 DIAGNOSIS — E440 Moderate protein-calorie malnutrition: Secondary | ICD-10-CM | POA: Diagnosis not present

## 2022-01-28 DIAGNOSIS — A0471 Enterocolitis due to Clostridium difficile, recurrent: Secondary | ICD-10-CM | POA: Diagnosis not present

## 2022-01-28 DIAGNOSIS — K529 Noninfective gastroenteritis and colitis, unspecified: Secondary | ICD-10-CM | POA: Diagnosis not present

## 2022-01-28 DIAGNOSIS — I10 Essential (primary) hypertension: Secondary | ICD-10-CM | POA: Diagnosis not present

## 2022-01-28 DIAGNOSIS — D696 Thrombocytopenia, unspecified: Secondary | ICD-10-CM | POA: Diagnosis not present

## 2022-01-28 DIAGNOSIS — A419 Sepsis, unspecified organism: Secondary | ICD-10-CM | POA: Diagnosis not present

## 2022-02-10 DIAGNOSIS — A0472 Enterocolitis due to Clostridium difficile, not specified as recurrent: Secondary | ICD-10-CM | POA: Diagnosis not present

## 2022-02-10 DIAGNOSIS — E440 Moderate protein-calorie malnutrition: Secondary | ICD-10-CM | POA: Diagnosis not present

## 2022-02-25 DIAGNOSIS — E43 Unspecified severe protein-calorie malnutrition: Secondary | ICD-10-CM | POA: Diagnosis not present

## 2022-02-25 DIAGNOSIS — N184 Chronic kidney disease, stage 4 (severe): Secondary | ICD-10-CM | POA: Diagnosis not present

## 2022-02-25 DIAGNOSIS — I5022 Chronic systolic (congestive) heart failure: Secondary | ICD-10-CM | POA: Diagnosis not present

## 2022-02-25 DIAGNOSIS — F419 Anxiety disorder, unspecified: Secondary | ICD-10-CM | POA: Diagnosis not present

## 2022-02-25 DIAGNOSIS — M810 Age-related osteoporosis without current pathological fracture: Secondary | ICD-10-CM | POA: Diagnosis not present

## 2022-02-25 DIAGNOSIS — Z79899 Other long term (current) drug therapy: Secondary | ICD-10-CM | POA: Diagnosis not present

## 2022-02-25 DIAGNOSIS — K769 Liver disease, unspecified: Secondary | ICD-10-CM | POA: Diagnosis not present

## 2022-05-13 DIAGNOSIS — H04123 Dry eye syndrome of bilateral lacrimal glands: Secondary | ICD-10-CM | POA: Diagnosis not present

## 2022-06-10 DIAGNOSIS — E039 Hypothyroidism, unspecified: Secondary | ICD-10-CM | POA: Diagnosis not present

## 2022-07-07 IMAGING — CT CT CHEST-ABD-PELV W/ CM
2 of 5 series · 14 of 46 positions shown, 16 images · IV contrast (omnipaque)
Comparison: February 29, 2020

CLINICAL DATA: Abdominal trauma.

EXAM:
CT CHEST, ABDOMEN, AND PELVIS WITH CONTRAST
TECHNIQUE: Multidetector CT imaging of the chest, abdomen and pelvis was
performed following the standard protocol during bolus
administration of intravenous contrast.
CONTRAST:  100mL OMNIPAQUE IOHEXOL 300 MG/ML  SOLN

[Series 3: cap with · axial · 0.69mm/px · z∈[-687,-212]mm · 11 of 115 slices shown, 13 images]
[im 10/115  soft-tissue]
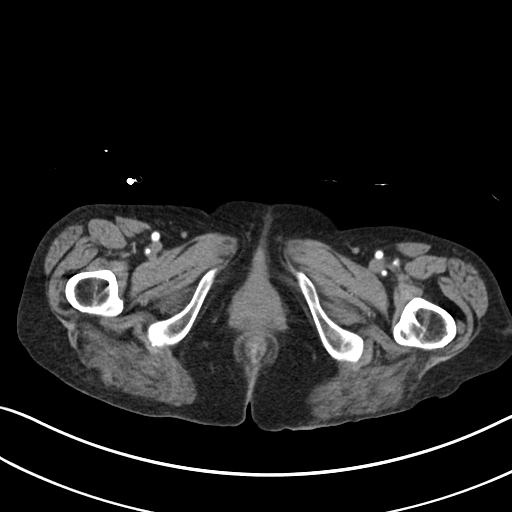
[im 10/115  bone]
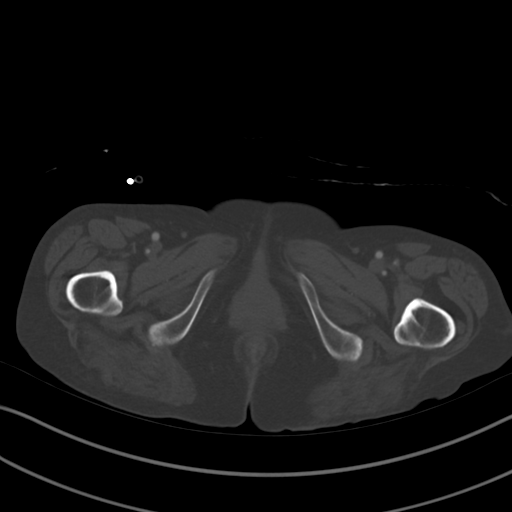
[im 20/115  soft-tissue]
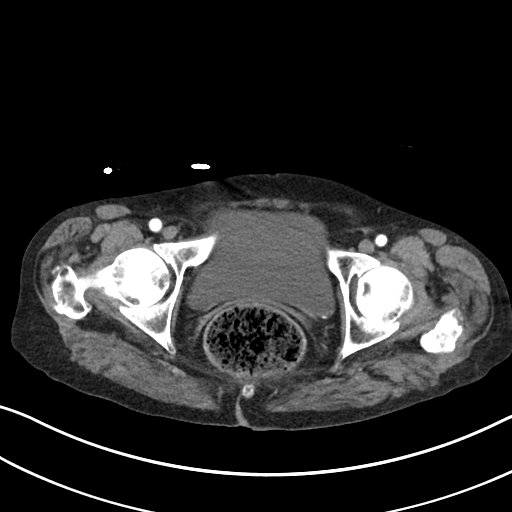
[im 29/115  soft-tissue]
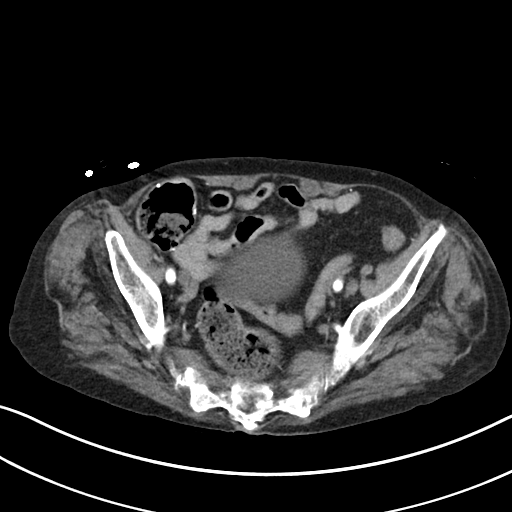
[im 39/115  soft-tissue]
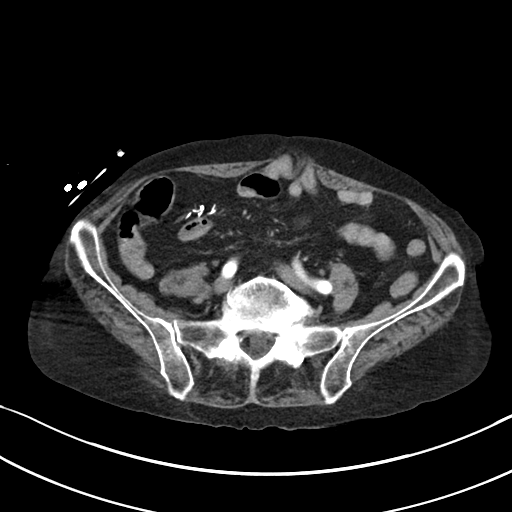
[im 48/115  soft-tissue]
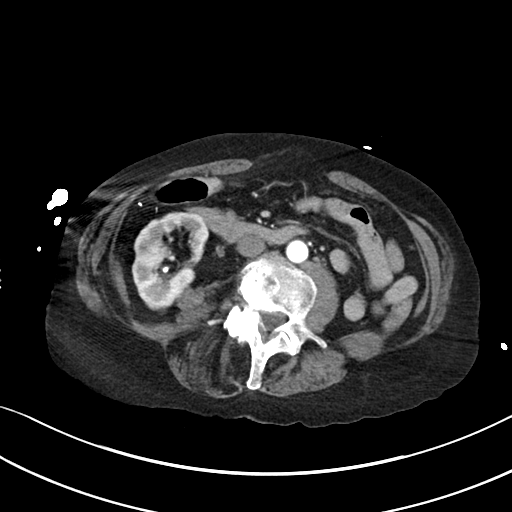
[im 58/115  soft-tissue]
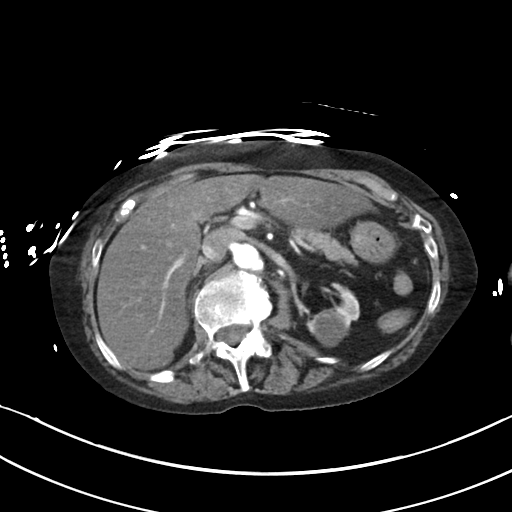
[im 67/115  soft-tissue]
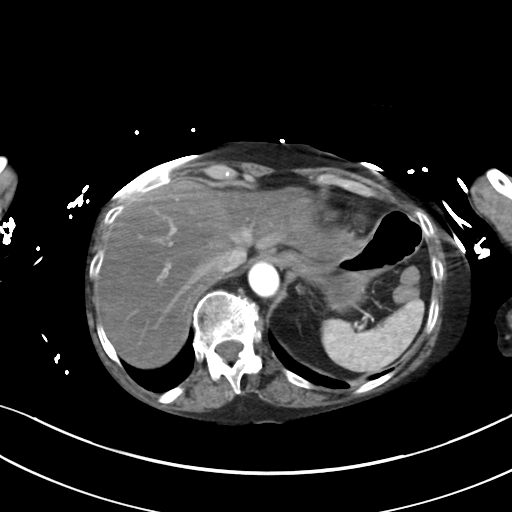
[im 77/115  soft-tissue]
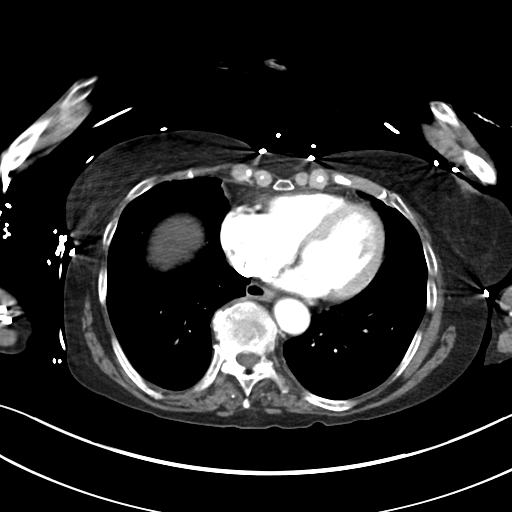
[im 86/115  soft-tissue]
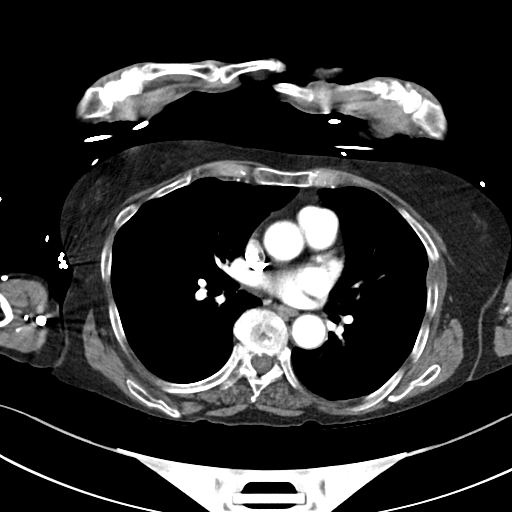
[im 86/115  bone]
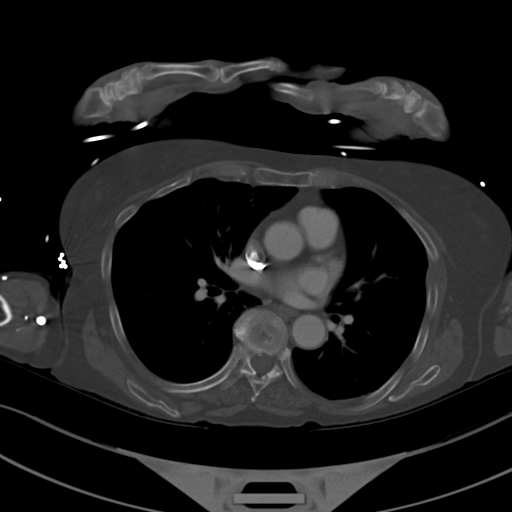
[im 96/115  soft-tissue]
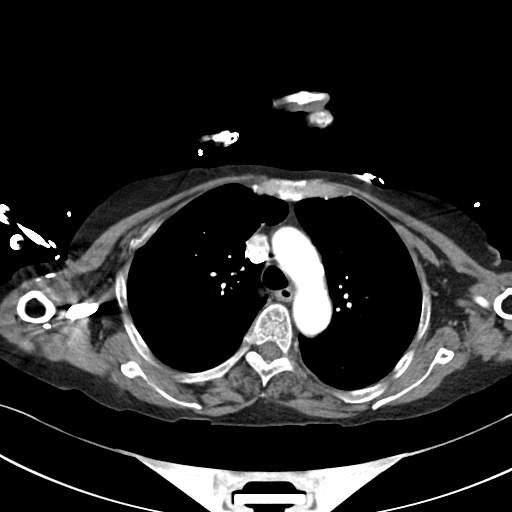
[im 105/115  soft-tissue]
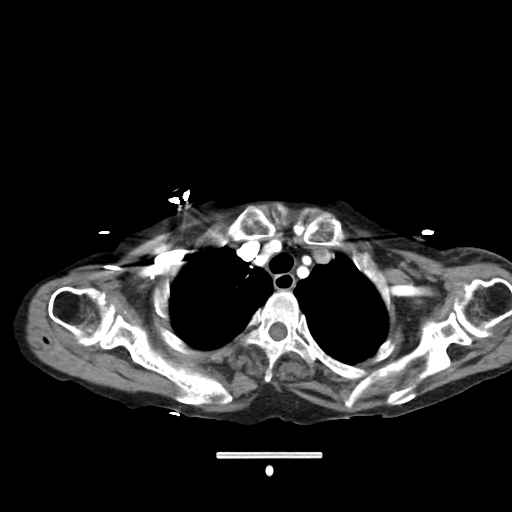

[Series 6: cor · coronal · 0.60mm/px · 3 of 80 slices shown]
[im 27/80  soft-tissue]
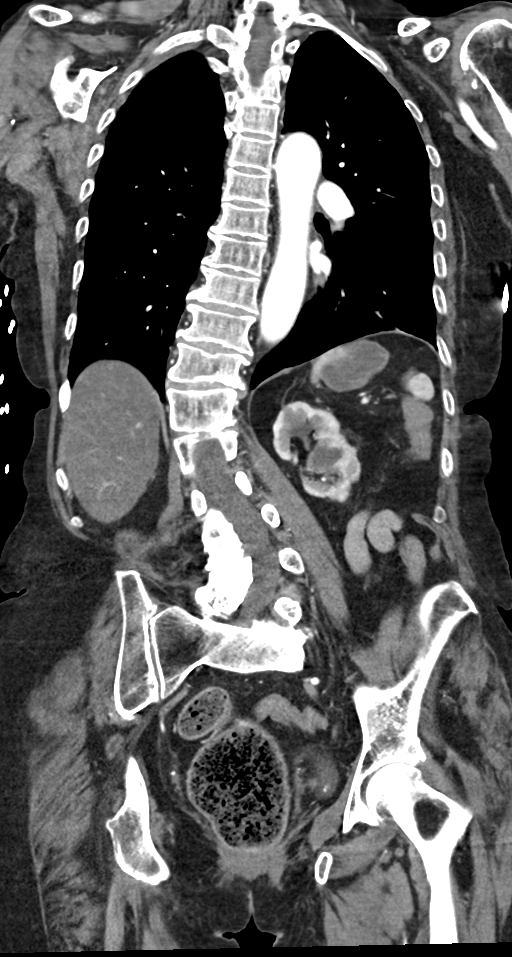
[im 36/80  soft-tissue]
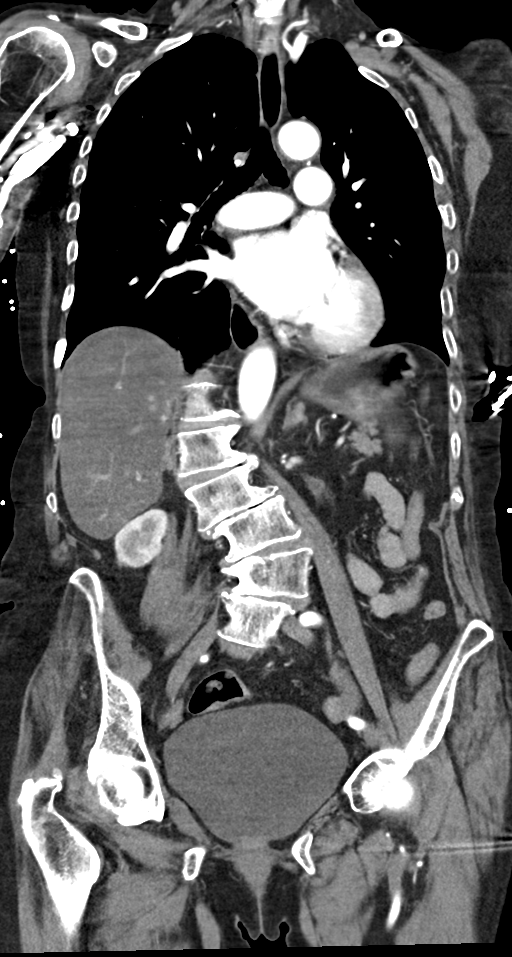
[im 44/80  soft-tissue]
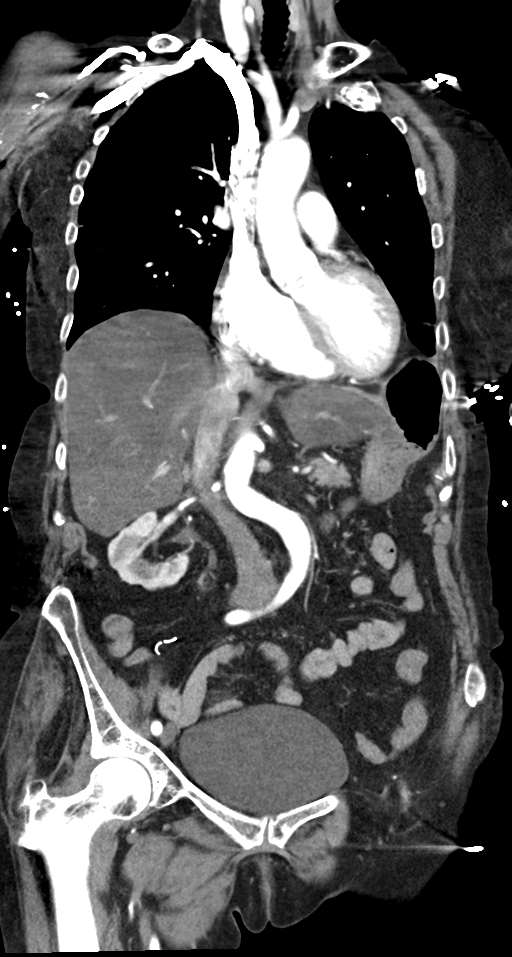

[14 of 46 positions shown; findings below may reference images not displayed]

FINDINGS: CT CHEST FINDINGS

Cardiovascular: Mildly enlarged heart. Calcific atherosclerotic
disease of the coronary arteries and aorta. No central pulmonary
embolus.

Mediastinum/Nodes: No enlarged mediastinal, hilar, or axillary lymph
nodes. Thyroid gland, trachea, and esophagus demonstrate no
significant findings.

Lungs/Pleura: Lungs are clear. No pleural effusion or pneumothorax.

Musculoskeletal: No chest wall mass or suspicious bone lesions
identified.

CT ABDOMEN PELVIS FINDINGS

Hepatobiliary: No hepatic injury or perihepatic hematoma.
Gallbladder is unremarkable. Hepatic steatosis.

Pancreas: Unremarkable. No pancreatic ductal dilatation or
surrounding inflammatory changes.

Spleen: No splenic injury or perisplenic hematoma.

Adrenals/Urinary Tract: Normal adrenal glands. Bilateral
nonobstructive renal calculi measuring up to 7 mm. 2.7 cm left renal
cyst. Normal urinary bladder.

Stomach/Bowel: Stomach is within normal limits. No evidence of bowel
wall thickening, distention, or inflammatory changes.

Vascular/Lymphatic: Aortic atherosclerosis and tortuosity. No
enlarged abdominal or pelvic lymph nodes.

Reproductive: Status post hysterectomy. No adnexal masses.

Other: Fat containing periumbilical anterior abdominal wall hernia.
Second fat and bowel containing infraumbilical anterior abdominal
wall hernia. No CT evidence of bowel incarceration. Postsurgical
changes in the abdomen.

Musculoskeletal: No fracture is seen. Scoliosis and spondylosis of
the spine.
IMPRESSION: 1. No evidence of acute traumatic injury to the chest, abdomen or
pelvis.
2. Hepatic steatosis.
3. Bilateral nonobstructive renal calculi.
4. Fat containing periumbilical anterior abdominal wall hernia.
5. Second fat and bowel containing infraumbilical anterior abdominal
wall hernia. No CT evidence of bowel incarceration.
6. Aortic atherosclerosis.

Aortic Atherosclerosis ([id number]).

## 2022-07-07 IMAGING — CT CT CERVICAL SPINE W/O CM
4 series · 15 of 33 positions shown, 18 images · non-contrast
Comparison: Brain CT 09/03/2020.

CLINICAL DATA: Patient status post fall.

EXAM:
CT HEAD WITHOUT CONTRAST
CT CERVICAL SPINE WITHOUT CONTRAST
TECHNIQUE: Multidetector CT imaging of the head and cervical spine was
performed following the standard protocol without intravenous
contrast. Multiplanar CT image reconstructions of the cervical spine
were also generated.

[Series 4: c spine soft · axial · 0.26mm/px · z∈[-198,-170]mm · 2 of 86 slices shown]
[im 15/86  soft-tissue]
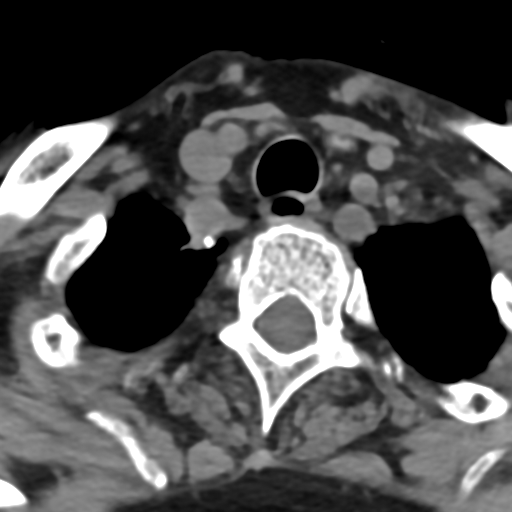
[im 29/86  soft-tissue]
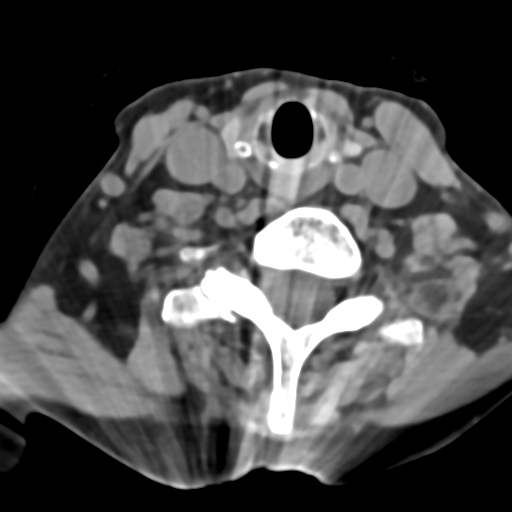

[Series 8: sag bone · sagittal · 0.29mm/px · 5 of 72 slices shown, 6 images]
[im 24/72  bone]
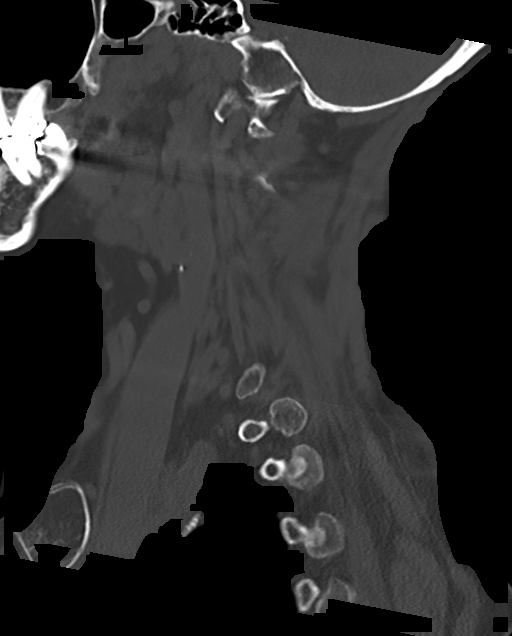
[im 30/72  bone]
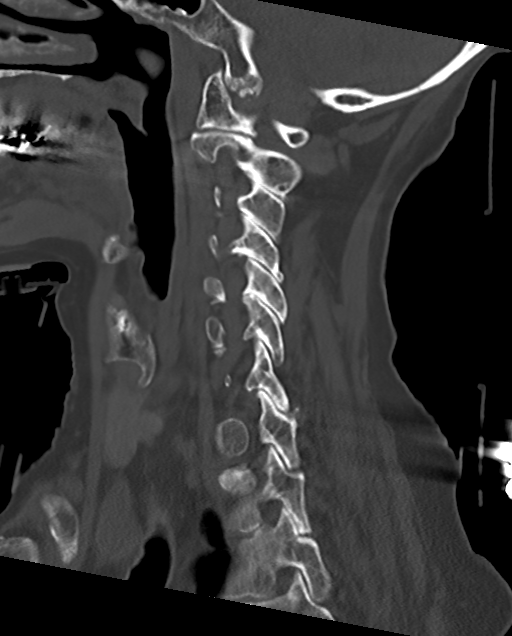
[im 36/72  soft-tissue]
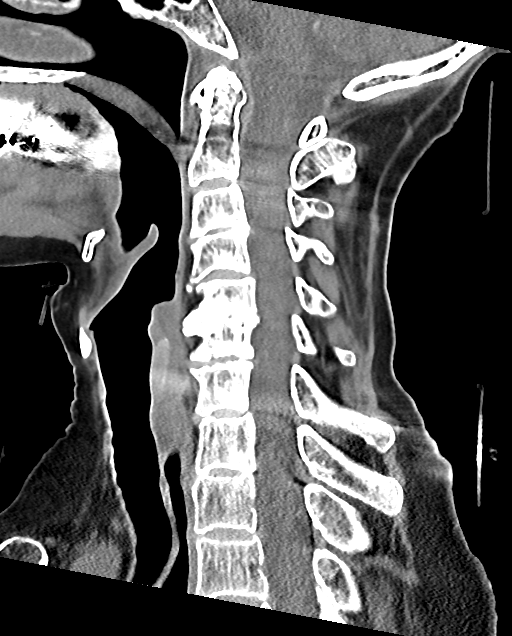
[im 36/72  bone]
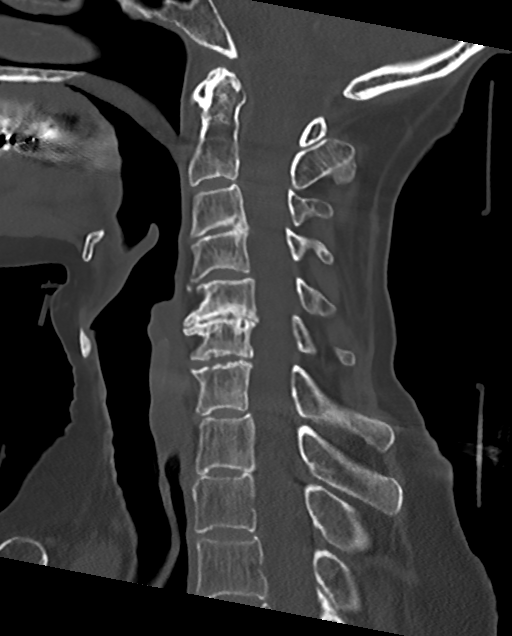
[im 42/72  bone]
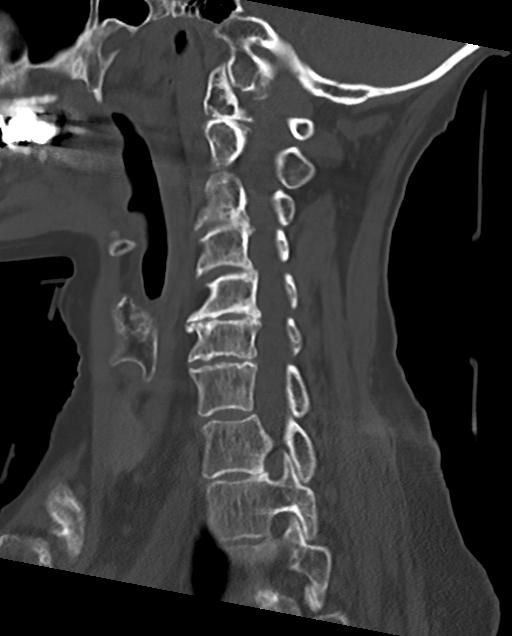
[im 48/72  bone]
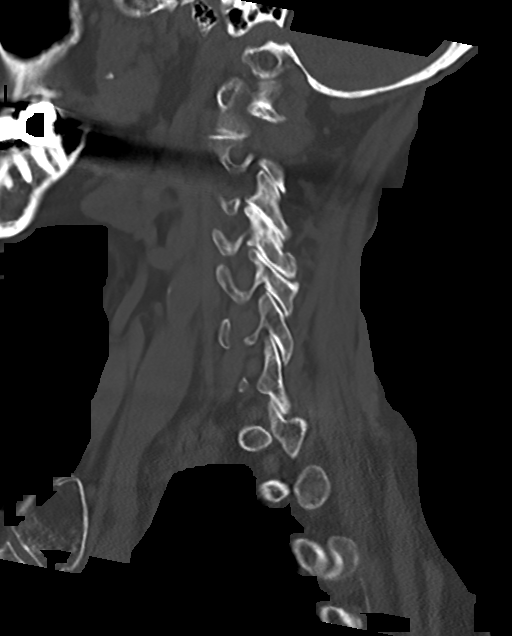

[Series 9: cor bone · coronal · 0.32mm/px · 3 of 70 slices shown]
[im 14/70  bone]
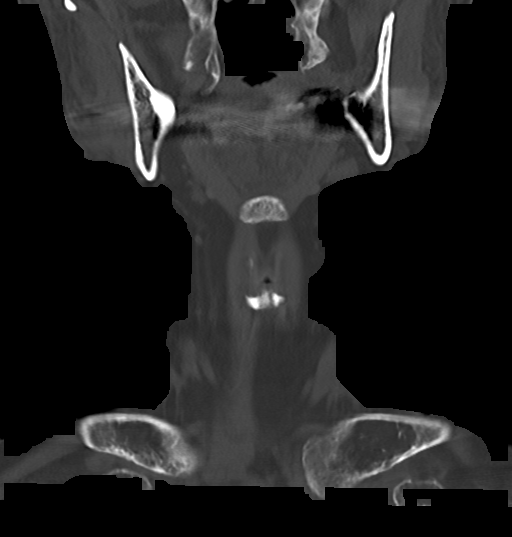
[im 28/70  bone]
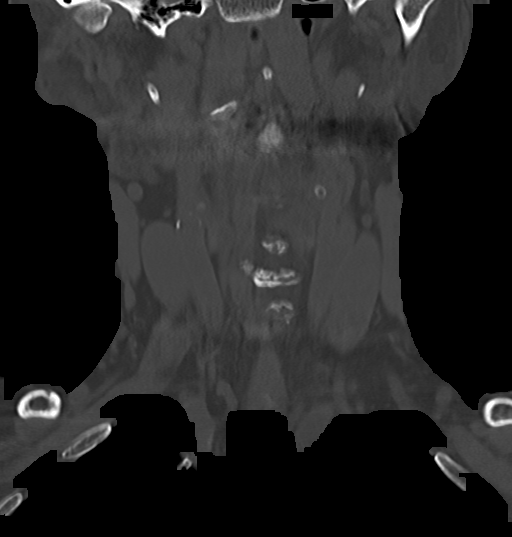
[im 42/70  bone]
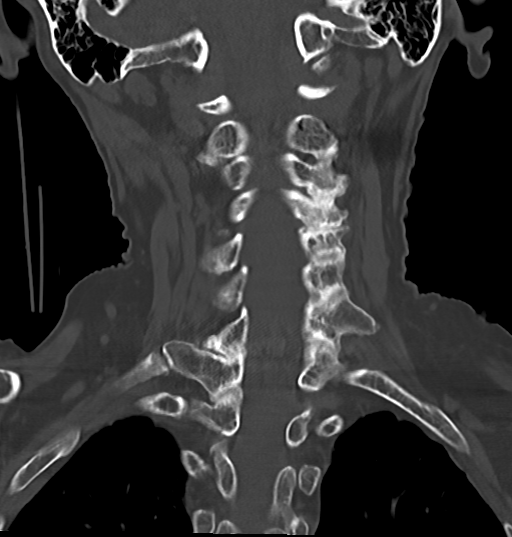

[Series 10: orthogonal axials · axial · 0.21mm/px · z∈[-205,-115]mm · 5 of 91 slices shown, 7 images]
[im 16/91  soft-tissue]
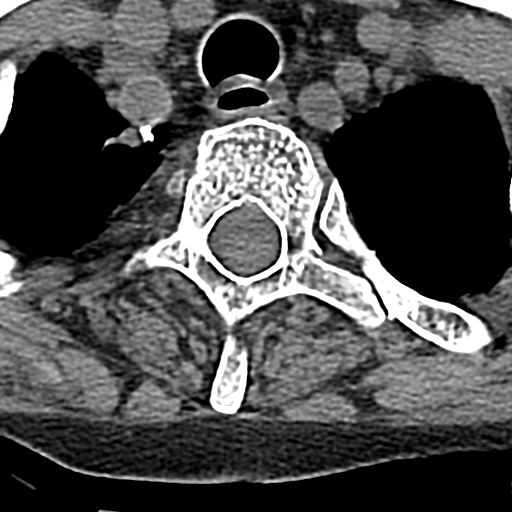
[im 16/91  bone]
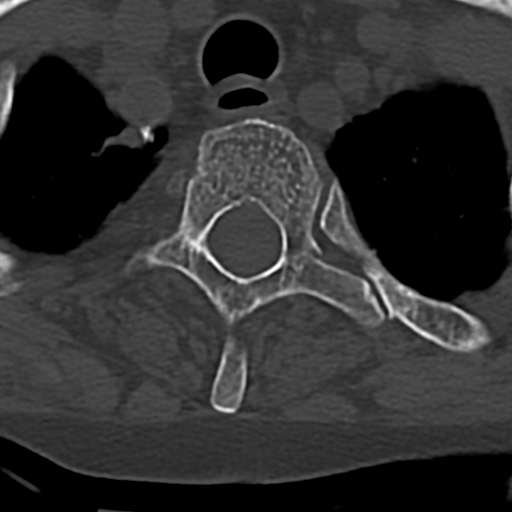
[im 31/91  bone]
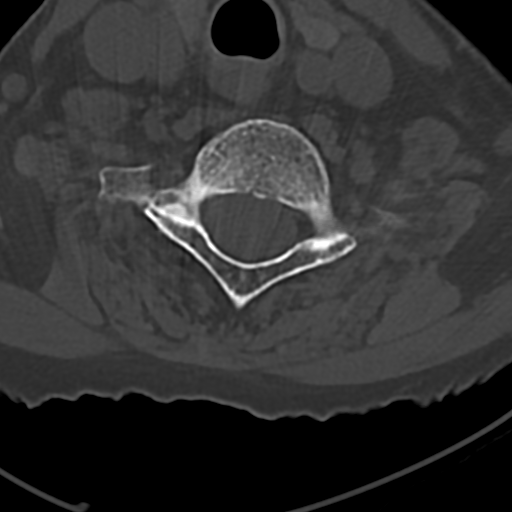
[im 46/91  bone]
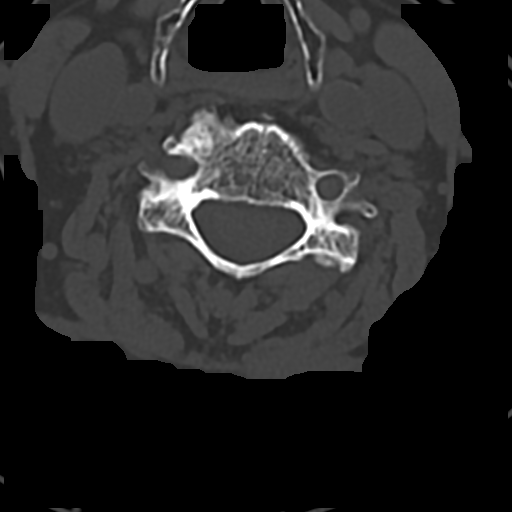
[im 61/91  bone]
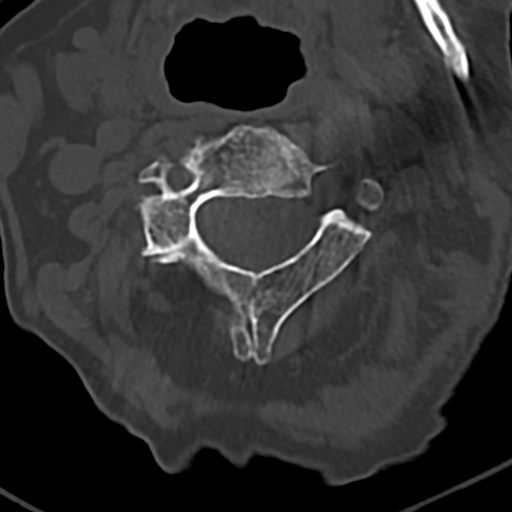
[im 76/91  soft-tissue]
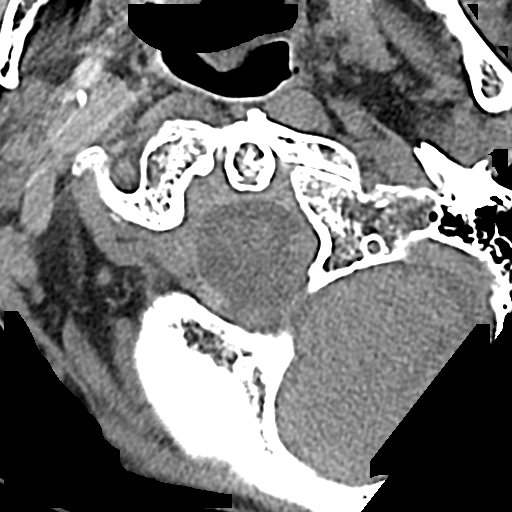
[im 76/91  bone]
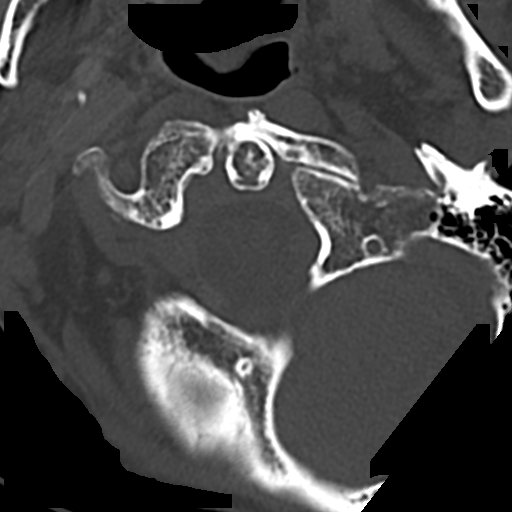

[15 of 33 positions shown; findings below may reference images not displayed]

FINDINGS: CT HEAD FINDINGS

Brain: Ventricles and sulci are appropriate for patient's age. No
evidence for acute cortically based infarct, intracranial
hemorrhage, mass lesion or mass-effect.

Vascular: Unremarkable.

Skull: No aggressive or acute appearing osseous lesions.

Sinuses/Orbits: Paranasal sinuses are well aerated. Mastoid air
cells are unremarkable. Orbits are unremarkable.

Other: None.

CT CERVICAL SPINE FINDINGS

Alignment: Normal anatomic alignment.

Skull base and vertebrae: Intact.

Soft tissues and spinal canal: No prevertebral fluid or swelling. No
visible canal hematoma.

Disc levels: Multilevel degenerative disc disease most pronounced
C5-6. No acute fracture.

Upper chest: Postsurgical changes right lung apex.

Other: None.
IMPRESSION: No acute cervical spine fracture.

No acute intracranial process.

## 2022-08-10 ENCOUNTER — Emergency Department (HOSPITAL_COMMUNITY): Payer: Medicare Other

## 2022-08-10 ENCOUNTER — Encounter (HOSPITAL_COMMUNITY): Payer: Self-pay | Admitting: Emergency Medicine

## 2022-08-10 ENCOUNTER — Inpatient Hospital Stay (HOSPITAL_COMMUNITY)
Admission: EM | Admit: 2022-08-10 | Discharge: 2022-08-14 | DRG: 481 | Disposition: A | Discharge status: Skilled Nursing Facility | Payer: Medicare Other | Attending: Family Medicine | Admitting: Family Medicine

## 2022-08-10 ENCOUNTER — Other Ambulatory Visit: Payer: Self-pay

## 2022-08-10 DIAGNOSIS — S129XXA Fracture of neck, unspecified, initial encounter: Secondary | ICD-10-CM | POA: Diagnosis not present

## 2022-08-10 DIAGNOSIS — Z515 Encounter for palliative care: Secondary | ICD-10-CM | POA: Diagnosis not present

## 2022-08-10 DIAGNOSIS — T68XXXA Hypothermia, initial encounter: Secondary | ICD-10-CM | POA: Diagnosis not present

## 2022-08-10 DIAGNOSIS — D72829 Elevated white blood cell count, unspecified: Secondary | ICD-10-CM

## 2022-08-10 DIAGNOSIS — S0101XA Laceration without foreign body of scalp, initial encounter: Secondary | ICD-10-CM | POA: Diagnosis present

## 2022-08-10 DIAGNOSIS — S12500A Unspecified displaced fracture of sixth cervical vertebra, initial encounter for closed fracture: Secondary | ICD-10-CM | POA: Diagnosis present

## 2022-08-10 DIAGNOSIS — F1020 Alcohol dependence, uncomplicated: Secondary | ICD-10-CM | POA: Diagnosis present

## 2022-08-10 DIAGNOSIS — N179 Acute kidney failure, unspecified: Secondary | ICD-10-CM | POA: Diagnosis present

## 2022-08-10 DIAGNOSIS — I340 Nonrheumatic mitral (valve) insufficiency: Secondary | ICD-10-CM | POA: Diagnosis not present

## 2022-08-10 DIAGNOSIS — S0003XA Contusion of scalp, initial encounter: Secondary | ICD-10-CM | POA: Diagnosis not present

## 2022-08-10 DIAGNOSIS — M810 Age-related osteoporosis without current pathological fracture: Secondary | ICD-10-CM | POA: Diagnosis present

## 2022-08-10 DIAGNOSIS — Z801 Family history of malignant neoplasm of trachea, bronchus and lung: Secondary | ICD-10-CM

## 2022-08-10 DIAGNOSIS — Z043 Encounter for examination and observation following other accident: Secondary | ICD-10-CM | POA: Diagnosis not present

## 2022-08-10 DIAGNOSIS — R627 Adult failure to thrive: Secondary | ICD-10-CM | POA: Diagnosis not present

## 2022-08-10 DIAGNOSIS — S129XXD Fracture of neck, unspecified, subsequent encounter: Secondary | ICD-10-CM | POA: Diagnosis not present

## 2022-08-10 DIAGNOSIS — I10 Essential (primary) hypertension: Secondary | ICD-10-CM

## 2022-08-10 DIAGNOSIS — R739 Hyperglycemia, unspecified: Secondary | ICD-10-CM | POA: Diagnosis not present

## 2022-08-10 DIAGNOSIS — R9431 Abnormal electrocardiogram [ECG] [EKG]: Secondary | ICD-10-CM | POA: Diagnosis not present

## 2022-08-10 DIAGNOSIS — Z9889 Other specified postprocedural states: Secondary | ICD-10-CM | POA: Diagnosis not present

## 2022-08-10 DIAGNOSIS — Y92008 Other place in unspecified non-institutional (private) residence as the place of occurrence of the external cause: Secondary | ICD-10-CM

## 2022-08-10 DIAGNOSIS — E8809 Other disorders of plasma-protein metabolism, not elsewhere classified: Secondary | ICD-10-CM | POA: Diagnosis present

## 2022-08-10 DIAGNOSIS — W19XXXA Unspecified fall, initial encounter: Principal | ICD-10-CM

## 2022-08-10 DIAGNOSIS — Z87891 Personal history of nicotine dependence: Secondary | ICD-10-CM

## 2022-08-10 DIAGNOSIS — Z4789 Encounter for other orthopedic aftercare: Secondary | ICD-10-CM | POA: Diagnosis not present

## 2022-08-10 DIAGNOSIS — S72142D Displaced intertrochanteric fracture of left femur, subsequent encounter for closed fracture with routine healing: Secondary | ICD-10-CM | POA: Diagnosis not present

## 2022-08-10 DIAGNOSIS — R0689 Other abnormalities of breathing: Secondary | ICD-10-CM | POA: Diagnosis not present

## 2022-08-10 DIAGNOSIS — E860 Dehydration: Secondary | ICD-10-CM | POA: Diagnosis present

## 2022-08-10 DIAGNOSIS — R278 Other lack of coordination: Secondary | ICD-10-CM | POA: Diagnosis not present

## 2022-08-10 DIAGNOSIS — Z7189 Other specified counseling: Secondary | ICD-10-CM | POA: Diagnosis not present

## 2022-08-10 DIAGNOSIS — E871 Hypo-osmolality and hyponatremia: Secondary | ICD-10-CM | POA: Diagnosis present

## 2022-08-10 DIAGNOSIS — F03911 Unspecified dementia, unspecified severity, with agitation: Secondary | ICD-10-CM | POA: Diagnosis present

## 2022-08-10 DIAGNOSIS — Z681 Body mass index (BMI) 19 or less, adult: Secondary | ICD-10-CM | POA: Diagnosis not present

## 2022-08-10 DIAGNOSIS — R54 Age-related physical debility: Secondary | ICD-10-CM | POA: Diagnosis present

## 2022-08-10 DIAGNOSIS — F0393 Unspecified dementia, unspecified severity, with mood disturbance: Secondary | ICD-10-CM | POA: Diagnosis present

## 2022-08-10 DIAGNOSIS — M7989 Other specified soft tissue disorders: Secondary | ICD-10-CM | POA: Diagnosis not present

## 2022-08-10 DIAGNOSIS — M6281 Muscle weakness (generalized): Secondary | ICD-10-CM | POA: Diagnosis not present

## 2022-08-10 DIAGNOSIS — R488 Other symbolic dysfunctions: Secondary | ICD-10-CM | POA: Diagnosis not present

## 2022-08-10 DIAGNOSIS — Z66 Do not resuscitate: Secondary | ICD-10-CM | POA: Diagnosis present

## 2022-08-10 DIAGNOSIS — W1830XA Fall on same level, unspecified, initial encounter: Secondary | ICD-10-CM | POA: Diagnosis present

## 2022-08-10 DIAGNOSIS — D62 Acute posthemorrhagic anemia: Secondary | ICD-10-CM | POA: Diagnosis not present

## 2022-08-10 DIAGNOSIS — M25552 Pain in left hip: Secondary | ICD-10-CM | POA: Diagnosis present

## 2022-08-10 DIAGNOSIS — E441 Mild protein-calorie malnutrition: Secondary | ICD-10-CM | POA: Diagnosis present

## 2022-08-10 DIAGNOSIS — R58 Hemorrhage, not elsewhere classified: Secondary | ICD-10-CM | POA: Diagnosis not present

## 2022-08-10 DIAGNOSIS — G4089 Other seizures: Secondary | ICD-10-CM | POA: Diagnosis present

## 2022-08-10 DIAGNOSIS — K709 Alcoholic liver disease, unspecified: Secondary | ICD-10-CM | POA: Diagnosis present

## 2022-08-10 DIAGNOSIS — S72142A Displaced intertrochanteric fracture of left femur, initial encounter for closed fracture: Secondary | ICD-10-CM | POA: Diagnosis not present

## 2022-08-10 DIAGNOSIS — Z7401 Bed confinement status: Secondary | ICD-10-CM | POA: Diagnosis not present

## 2022-08-10 DIAGNOSIS — D649 Anemia, unspecified: Secondary | ICD-10-CM | POA: Diagnosis not present

## 2022-08-10 DIAGNOSIS — F32A Depression, unspecified: Secondary | ICD-10-CM | POA: Diagnosis present

## 2022-08-10 DIAGNOSIS — E46 Unspecified protein-calorie malnutrition: Secondary | ICD-10-CM

## 2022-08-10 DIAGNOSIS — K219 Gastro-esophageal reflux disease without esophagitis: Secondary | ICD-10-CM | POA: Diagnosis present

## 2022-08-10 DIAGNOSIS — D638 Anemia in other chronic diseases classified elsewhere: Secondary | ICD-10-CM | POA: Diagnosis present

## 2022-08-10 DIAGNOSIS — Z79899 Other long term (current) drug therapy: Secondary | ICD-10-CM | POA: Diagnosis not present

## 2022-08-10 DIAGNOSIS — S299XXA Unspecified injury of thorax, initial encounter: Secondary | ICD-10-CM | POA: Diagnosis not present

## 2022-08-10 DIAGNOSIS — S0990XA Unspecified injury of head, initial encounter: Secondary | ICD-10-CM | POA: Diagnosis not present

## 2022-08-10 DIAGNOSIS — R569 Unspecified convulsions: Secondary | ICD-10-CM | POA: Diagnosis not present

## 2022-08-10 DIAGNOSIS — R456 Violent behavior: Secondary | ICD-10-CM | POA: Diagnosis not present

## 2022-08-10 DIAGNOSIS — Z90710 Acquired absence of both cervix and uterus: Secondary | ICD-10-CM

## 2022-08-10 DIAGNOSIS — R0902 Hypoxemia: Secondary | ICD-10-CM | POA: Diagnosis not present

## 2022-08-10 LAB — CBC WITH DIFFERENTIAL/PLATELET
Abs Immature Granulocytes: 0.14 10*3/uL — ABNORMAL HIGH (ref 0.00–0.07)
Basophils Absolute: 0.1 10*3/uL (ref 0.0–0.1)
Basophils Relative: 0 %
Eosinophils Absolute: 0 10*3/uL (ref 0.0–0.5)
Eosinophils Relative: 0 %
HCT: 30.8 % — ABNORMAL LOW (ref 36.0–46.0)
Hemoglobin: 10.1 g/dL — ABNORMAL LOW (ref 12.0–15.0)
Immature Granulocytes: 1 %
Lymphocytes Relative: 10 %
Lymphs Abs: 1.8 10*3/uL (ref 0.7–4.0)
MCH: 28.9 pg (ref 26.0–34.0)
MCHC: 32.8 g/dL (ref 30.0–36.0)
MCV: 88.3 fL (ref 80.0–100.0)
Monocytes Absolute: 1.2 10*3/uL — ABNORMAL HIGH (ref 0.1–1.0)
Monocytes Relative: 6 %
Neutro Abs: 15.4 10*3/uL — ABNORMAL HIGH (ref 1.7–7.7)
Neutrophils Relative %: 83 %
Platelets: 164 10*3/uL (ref 150–400)
RBC: 3.49 MIL/uL — ABNORMAL LOW (ref 3.87–5.11)
RDW: 14.9 % (ref 11.5–15.5)
WBC: 18.6 10*3/uL — ABNORMAL HIGH (ref 4.0–10.5)
nRBC: 0 % (ref 0.0–0.2)

## 2022-08-10 LAB — ETHANOL: Alcohol, Ethyl (B): <10 mg/dL (ref <10)

## 2022-08-10 LAB — COMPREHENSIVE METABOLIC PANEL
ALT: 15 U/L (ref 0–44)
AST: 25 U/L (ref 15–41)
Albumin: 3.4 g/dL — ABNORMAL LOW (ref 3.5–5.0)
Alkaline Phosphatase: 87 U/L (ref 38–126)
Anion gap: 11 (ref 5–15)
BUN: 18 mg/dL (ref 8–23)
CO2: 22 mmol/L (ref 22–32)
Calcium: 8.5 mg/dL — ABNORMAL LOW (ref 8.9–10.3)
Chloride: 96 mmol/L — ABNORMAL LOW (ref 98–111)
Creatinine, Ser: 1.31 mg/dL — ABNORMAL HIGH (ref 0.44–1.00)
GFR, Estimated: 41 mL/min — ABNORMAL LOW (ref >60)
Glucose, Bld: 162 mg/dL — ABNORMAL HIGH (ref 70–99)
Potassium: 3.7 mmol/L (ref 3.5–5.1)
Sodium: 129 mmol/L — ABNORMAL LOW (ref 135–145)
Total Bilirubin: 0.9 mg/dL (ref 0.3–1.2)
Total Protein: 6.1 g/dL — ABNORMAL LOW (ref 6.5–8.1)

## 2022-08-10 LAB — PROTIME-INR
INR: 1.1 (ref 0.8–1.2)
Prothrombin Time: 13.7 seconds (ref 11.4–15.2)

## 2022-08-10 MED ORDER — SODIUM CHLORIDE 0.9 % IV BOLUS
500.0000 mL | Freq: Once | INTRAVENOUS | Status: AC
  Administered 2022-08-10: 500 mL via INTRAVENOUS

## 2022-08-10 MED ORDER — CEFAZOLIN SODIUM-DEXTROSE 2-4 GM/100ML-% IV SOLN
2.0000 g | INTRAVENOUS | Status: AC
  Administered 2022-08-11: 2 g via INTRAVENOUS
  Filled 2022-08-10: qty 100

## 2022-08-10 MED ORDER — HYDROMORPHONE HCL 1 MG/ML IJ SOLN
0.5000 mg | Freq: Once | INTRAMUSCULAR | Status: AC
  Administered 2022-08-10: 0.5 mg via INTRAVENOUS
  Filled 2022-08-10: qty 0.5

## 2022-08-10 MED ORDER — HYDROMORPHONE HCL 1 MG/ML IJ SOLN
0.5000 mg | INTRAMUSCULAR | Status: DC | PRN
  Administered 2022-08-11 – 2022-08-12 (×8): 0.5 mg via INTRAVENOUS
  Filled 2022-08-10 (×8): qty 0.5

## 2022-08-10 MED ORDER — ONDANSETRON HCL 4 MG/2ML IJ SOLN
4.0000 mg | Freq: Once | INTRAMUSCULAR | Status: AC
  Administered 2022-08-10: 4 mg via INTRAVENOUS
  Filled 2022-08-10: qty 2

## 2022-08-10 MED ORDER — TRANEXAMIC ACID-NACL 1000-0.7 MG/100ML-% IV SOLN
1000.0000 mg | INTRAVENOUS | Status: AC
  Administered 2022-08-11: 1000 mg via INTRAVENOUS
  Filled 2022-08-10: qty 100

## 2022-08-10 MED ORDER — HYDROMORPHONE HCL 1 MG/ML IJ SOLN: 1.0000 mg | Freq: Once | INTRAMUSCULAR | Status: DC

## 2022-08-10 MED ORDER — HYDROGEN PEROXIDE 3 % EX SOLN
CUTANEOUS | Status: AC
  Filled 2022-08-10: qty 473

## 2022-08-10 NOTE — ED Notes (Signed)
Hospitalist in room.

## 2022-08-10 NOTE — H&P (Incomplete)
History and Physical    Patient: [name]:[id number] DOB: 1941-05-11 DOA: 08/10/2022 DOS: the patient was seen and examined on 08/10/2022 PCP: Angela Noble, MD  Patient coming from: Home  Chief Complaint:  Chief Complaint  Patient presents with  . Fall   HPI: [name] is a 81 y.o. female with medical history significant of hypertension, seizures, alcohol dependence, depression who presents to the emergency department via EMS due to fall sustained at home.       Review of Systems: {ROS_Text:26778} Past Medical History:  Diagnosis Date  . Abdominal wall hernia, periumbilical, fat-containing 05/19/2021   CT AP 05/18/21: Fat containing periumbilical anterior abdominal wall hernia.  Second fat and bowel containing infraumbilical anterior abdominal wall hernia. No CT evidence of bowel incarceration.  . Alcohol dependence ([place])   . 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[place])   . Anxiety   . Arthritis   . At risk for seizures   . Atherosclerosis of aorta ([place]) 05/19/2021   CT AP 05/18/21 finding  . 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 Carpal tunnel syndrome, bilateral   . Cholestasis   . Chronic diarrhea    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 Dx [facility]  . Complicated UTI (urinary tract infection) 03/05/2020   Obstructive ureteral stone with E.coli bacteremia  . Degenerative disc disease, cervical 05/19/2021   Cervical CT (CC: found down): Multilevel degenerative disc disease most pronounced C5-6.  [place] Kitchen Depression   . GERD (gastroesophageal reflux disease)   . Kidney stones   . Liver disease   . Macrocytic anemia 05/19/2021  . Mitral valve regurgitation, moderate 05/22/2021  . Multiple closed fractures of ribs of left side 05/18/2021  . Osteoporosis   . Osteoporosis    Treated with denosumab  . Physical deconditioning   . Recurrent falls   . Recurrent kidney stones    Calcium oxalate monohydrate on stone analysis 05/2020 at [facility] urology  . Seizure ([place])     started on Keppra after SAH  . Subarachnoid bleed ([place])   . Subarachnoid hemorrhage ([place]) 12/19/2019   Past Surgical History:  Procedure Laterality Date  . ABDOMINAL HYSTERECTOMY     vaginal  . ABDOMINAL HYSTERECTOMY     Vaginal  . APPENDECTOMY    . CARPAL TUNNEL RELEASE  01/21/2012   Procedure: CARPAL TUNNEL RELEASE;  Surgeon: Angela Sours, MD;  Location: [place];  Service: Orthopedics;  Laterality: Right;  . CARPAL TUNNEL RELEASE Right 01/02/2012   Dr [name] ([place])  . COLON RESECTION  2004   perf bowel after colonoscopy  . CYSTOSCOPY W/ RETROGRADES Bilateral 02/28/2018   Procedure: CYSTOSCOPY WITH BILATERAL RETROGRADE PYELOGRAM;BILATERAL URETERAL STENT PLACEMENT;  Surgeon: Angela Gustin, MD;  Location: [facility];  Service: Urology;  Laterality: Bilateral;  . CYSTOSCOPY W/ URETERAL STENT PLACEMENT  2010   lt-lazer stone  . CYSTOSCOPY W/ URETERAL STENT PLACEMENT Bilateral 02/29/2020   Procedure: CYSTOSCOPY WITH BILATERAL RETROGRADE PYELOGRAM;BILATERAL URETERAL STENT PLACEMENT;  Surgeon: Angela Gustin, MD;  Location: [facility];  Service: Urology;  Laterality: Bilateral;  . CYSTOSCOPY WITH STENT PLACEMENT Bilateral 09/08/2014   Procedure: CYSTOSCOPY WITH STENT PLACEMENT;  Surgeon: Angela So, MD;  Location: [facility];  Service: Urology;  Laterality: Bilateral;  . SMALL INTESTINE SURGERY  2004   Iliectomy  . THORACOTOMY  2007   vatz-rt upper lobe  . THORACOTOMY  12/01/2005   VATS Right Upper Lung Lobe - Dr [name] ([facility])  . TONSILLECTOMY    .  TONSILLECTOMY     Social History:  reports that she quit smoking about 25 years ago. Her smoking use included cigarettes. She has never used smokeless tobacco. She reports current alcohol use. She reports that she does not use drugs.  Allergies  Allergen Reactions  . Clindamycin Rash  . Clindamycin/Lincomycin Rash  . Doxycycline Rash  . Doxycycline Hyclate Rash    Family History  Problem Relation Age of Onset  .  Other Mother        "old age" - died at age 41  . Lung cancer Father     Prior to Admission medications   Medication Sig Start Date End Date Taking? Authorizing Provider  Biotin 2.5 MG TABS Take 1 tablet by mouth daily.     [provider]  Calcium Carb-Cholecalciferol (CALCIUM + D3 PO) Take 1 tablet by mouth daily with breakfast.    [provider]  cycloSPORINE (RESTASIS) 0.05 % ophthalmic emulsion Place 1 drop into both eyes 2 (two) times daily.     [provider]  escitalopram (LEXAPRO) 20 MG tablet Take 1 tablet (20 mg total) by mouth in the morning. 05/25/21   Lilland, Alana, DO  folic acid (FOLVITE) 1 MG tablet Take 1 tablet (1 mg total) by mouth daily. 05/25/21   Lilland, Alana, DO  KEPPRA 500 MG tablet Take 1 tablet by mouth daily. 12/12/21   [provider]  metoprolol succinate (TOPROL-XL) 25 MG 24 hr tablet Take 1 tablet (25 mg total) by mouth daily. 01/24/22   Angela Eva, MD  Multiple Vitamin (MULTIVITAMIN WITH MINERALS) TABS tablet Take 1 tablet by mouth daily. 11/08/21   Angela Aloe, MD  potassium citrate (UROCIT-K) 10 MEQ (1080 MG) SR tablet Take 10 mEq by mouth daily.  03/22/19   [provider]  thiamine 100 MG tablet Take 100 mg by mouth daily.     [provider]  vancomycin (VANCOCIN) 125 MG capsule Take 1 capsule (125 mg total) by mouth 4 (four) times daily. 01/24/22   Angela Eva, MD    Physical Exam: Vitals:   08/10/22 2119 08/10/22 2121  BP:  (!) 144/117  Pulse:  97  Resp:  (!) 22  TempSrc:  Oral  SpO2:  100%  Weight: 40 kg   Height: 5' (1.524 m)    *** Data Reviewed: {Tip this will not be part of the note when signed- Document your independent interpretation of telemetry tracing, EKG, lab, Radiology test or any other diagnostic tests. Add any new diagnostic test ordered today. (Optional):26781} {Results:26384}  Assessment and Plan: No notes have been filed under this hospital service. Service:  Hospitalist     Advance Care Planning:   Code Status: Prior ***  Consults: ***  Family Communication: ***  Severity of Illness: {Observation/Inpatient:21159}  Author: Bernadette Hoit, DO 08/10/2022 11:27 PM  For on call review [email or web address].

## 2022-08-10 NOTE — H&P (Addendum)
History and Physical    Patient: [name] QZR:[id number] DOB: 1941-09-06 DOA: 08/10/2022 DOS: the patient was seen and examined on 08/11/2022 PCP: Asencion Noble, MD  Patient coming from: Home  Chief Complaint:  Chief Complaint  Patient presents with   Fall   HPI: [name] is a 81 y.o. female with medical history significant of hypertension, seizures, alcohol dependence, depression who presents to the emergency department via EMS due to fall sustained at home.  Patient lives alone and has a caregiver who takes care of her from 8 AM to 7 PM, patient only complained of left hip pain and did not provide details regarding her fall.  Most of the history was obtained from ED physician and caregiver at bedside, per caregiver, patient sustained an unwitnessed fall after she left for home this evening.  Apparently, patient fell and sustained scalp laceration and hip pain with difficulty in being able to bear weight on the left lower extremity due to pain.  EMS was activated, and patient was taken to the ED for further evaluation and management.  Patient sustained bleeding from the scalp laceration, but she denies fever, chills, chest pain, shortness of breath, nausea or vomiting.  Patient's last alcohol consumption was today.  ED Course:  In the emergency department, she was tachypneic and tachycardic, BP was 144/117, other vital signs were within normal range.  Work-up in the ED showed normal CBC except for leukocytosis and normocytic anemia.  BMP showed hyponatremia, hyperglycemia, BUN/creatinine 18/1.31 (baseline creatinine at 0.9-1.0), albumin 3.4. CT head without contrast showed no acute intracranial process CT cervical spine without contrast showed acute fracture of the spinous process of C6.  No vertebral body or pedicle fracture. Chest x-ray showed no evidence of acute chest process. Left hip x-ray showed comminuted displaced left intertrochanteric fracture Patient was treated with  IV Dilaudid 0.5 mg, Zofran was given and IV hydration was provided. Neurosurgery was consulted in relation to C6 spinous process noted on CT cervical spine and recommended neck collar per ED physician Orthopedic surgeon (Dr. Amedeo Kinsman) was consulted and recommended placing patient n.p.o. after midnight we will plan to follow-up with patient in the morning per ED physician.  Review of Systems: Review of systems as noted in the HPI. All other systems reviewed and are negative.   Past Medical History:  Diagnosis Date   Abdominal wall hernia, periumbilical, fat-containing 05/19/2021   CT AP 05/18/21: Fat containing periumbilical anterior abdominal wall hernia.  Second fat and bowel containing infraumbilical anterior abdominal wall hernia. No CT evidence of bowel incarceration.   Alcohol dependence ([place])    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[place])    Anxiety    Arthritis    At risk for seizures    Atherosclerosis of aorta ([place]) 05/19/2021   CT AP 05/18/21 finding   Carcinoid bronchial adenoma of right lung ([facility])    Carpal tunnel syndrome, bilateral    Cholestasis    Chronic diarrhea    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 Dx [facility] GI   Complicated UTI (urinary tract infection) 03/05/2020   Obstructive ureteral stone with E.coli bacteremia   Degenerative disc disease, cervical 05/19/2021   Cervical CT (CC: found down): Multilevel degenerative disc disease most pronounced C5-6.   Depression    GERD (gastroesophageal reflux disease)    Kidney stones    Liver disease    Macrocytic anemia 05/19/2021   Mitral valve regurgitation, moderate 05/22/2021   Multiple closed fractures of ribs of left side 05/18/2021  Osteoporosis    Osteoporosis    Treated with denosumab   Physical deconditioning    Recurrent falls    Recurrent kidney stones    Calcium oxalate monohydrate on stone analysis 05/2020 at [facility] urology   Seizure [facility])    started on Keppra after [facility]   Subarachnoid bleed ([place])    Subarachnoid  hemorrhage ([place]) 12/19/2019   Past Surgical History:  Procedure Laterality Date   ABDOMINAL HYSTERECTOMY     vaginal   ABDOMINAL HYSTERECTOMY     Vaginal   APPENDECTOMY     CARPAL TUNNEL RELEASE  01/21/2012   Procedure: CARPAL TUNNEL RELEASE;  Surgeon: Wynonia Sours, MD;  Location: [place];  Service: Orthopedics;  Laterality: Right;   CARPAL TUNNEL RELEASE Right 01/02/2012   Dr Fredna Dow ([place])   COLON RESECTION  2004   perf bowel after colonoscopy   CYSTOSCOPY W/ RETROGRADES Bilateral 02/28/2018   Procedure: CYSTOSCOPY WITH BILATERAL RETROGRADE PYELOGRAM;BILATERAL URETERAL STENT PLACEMENT;  Surgeon: Cleon Gustin, MD;  Location: [facility];  Service: Urology;  Laterality: Bilateral;   CYSTOSCOPY W/ URETERAL STENT PLACEMENT  2010   lt-lazer stone   CYSTOSCOPY W/ URETERAL STENT PLACEMENT Bilateral 02/29/2020   Procedure: CYSTOSCOPY WITH BILATERAL RETROGRADE PYELOGRAM;BILATERAL URETERAL STENT PLACEMENT;  Surgeon: Cleon Gustin, MD;  Location: [facility];  Service: Urology;  Laterality: Bilateral;   CYSTOSCOPY WITH STENT PLACEMENT Bilateral 09/08/2014   Procedure: CYSTOSCOPY WITH STENT PLACEMENT;  Surgeon: Malka So, MD;  Location: [facility];  Service: Urology;  Laterality: Bilateral;   SMALL INTESTINE SURGERY  2004   Iliectomy   THORACOTOMY  2007   vatz-rt upper lobe   THORACOTOMY  12/01/2005   VATS Right Upper Lung Lobe - Dr Arlyce Dice ([facility])   TONSILLECTOMY     TONSILLECTOMY      Social History:  reports that she quit smoking about 25 years ago. Her smoking use included cigarettes. She has never used smokeless tobacco. She reports current alcohol use. She reports that she does not use drugs.   Allergies  Allergen Reactions   Clindamycin Rash   Clindamycin/Lincomycin Rash   Doxycycline Rash   Doxycycline Hyclate Rash    Family History  Problem Relation Age of Onset   Other Mother        "old age" - died at age 41   Lung cancer Father      Prior to  Admission medications   Medication Sig Start Date End Date Taking? Authorizing Provider  Biotin 2.5 MG TABS Take 1 tablet by mouth daily.     [provider]  Calcium Carb-Cholecalciferol (CALCIUM + D3 PO) Take 1 tablet by mouth daily with breakfast.    [provider]  cycloSPORINE (RESTASIS) 0.05 % ophthalmic emulsion Place 1 drop into both eyes 2 (two) times daily.     [provider]  escitalopram (LEXAPRO) 20 MG tablet Take 1 tablet (20 mg total) by mouth in the morning. 05/25/21   Lilland, Alana, DO  folic acid (FOLVITE) 1 MG tablet Take 1 tablet (1 mg total) by mouth daily. 05/25/21   Lilland, Alana, DO  KEPPRA 500 MG tablet Take 1 tablet by mouth daily. 12/12/21   [provider]  metoprolol succinate (TOPROL-XL) 25 MG 24 hr tablet Take 1 tablet (25 mg total) by mouth daily. 01/24/22   Orson Eva, MD  Multiple Vitamin (MULTIVITAMIN WITH MINERALS) TABS tablet Take 1 tablet by mouth daily. 11/08/21   Mariel Aloe, MD  potassium  citrate (UROCIT-K) 10 MEQ (1080 MG) SR tablet Take 10 mEq by mouth daily.  03/22/19   [provider]  thiamine 100 MG tablet Take 100 mg by mouth daily.     [provider]  vancomycin (VANCOCIN) 125 MG capsule Take 1 capsule (125 mg total) by mouth 4 (four) times daily. 01/24/22   Orson Eva, MD    Physical Exam: BP (!) 150/98 (BP Location: Left Arm)   Pulse (!) 115   Temp 97.7 F (36.5 C)   Resp 20   Ht 5' (1.524 m)   Wt 40 kg   SpO2 98%   BMI 17.22 kg/m   General: 81 y.o. year-old female ill appearing, cachectic, but in no acute distress.  Alert and oriented x3. HEENT: Scalp laceration with staples and noted bleed around the head and neck, EOMI, dry mucous membrane Neck: Noted neck collar in patient. trachea medial Cardiovascular: Tachycardia.  Regular rate and rhythm with no rubs or gallops.  No thyromegaly or JVD noted.  No lower extremity edema. 2/4 pulses in all 4 extremities. Respiratory:  Tachypnea.  Clear to auscultation with no wheezes or rales. Abdomen: Soft, nontender nondistended with normal bowel sounds x4 quadrants. Muskuloskeletal: Tender to palpation of left hip.  Decreased ROM of LLE due to pain.   Neuro: CN II-XII intact,sensation, reflexes intact Skin: Scalp laceration status post stapled repair.  Psychiatry: Judgement and insight appear normal. Mood is appropriate for condition and setting          Labs on Admission:  Basic Metabolic Panel: Recent Labs  Lab 08/10/22 2244  NA 129*  K 3.7  CL 96*  CO2 22  GLUCOSE 162*  BUN 18  CREATININE 1.31*  CALCIUM 8.5*   Liver Function Tests: Recent Labs  Lab 08/10/22 2244  AST 25  ALT 15  ALKPHOS 87  BILITOT 0.9  PROT 6.1*  ALBUMIN 3.4*   No results for input(s): "LIPASE", "AMYLASE" in the last 168 hours. No results for input(s): "AMMONIA" in the last 168 hours. CBC: Recent Labs  Lab 08/10/22 2244  WBC 18.6*  NEUTROABS 15.4*  HGB 10.1*  HCT 30.8*  MCV 88.3  PLT 164   Cardiac Enzymes: No results for input(s): "CKTOTAL", "CKMB", "CKMBINDEX", "TROPONINI" in the last 168 hours.  BNP (last 3 results) No results for input(s): "BNP" in the last 8760 hours.  ProBNP (last 3 results) No results for input(s): "PROBNP" in the last 8760 hours.  CBG: No results for input(s): "GLUCAP" in the last 168 hours.  Radiological Exams on Admission: CT Head Wo Contrast  Result Date: 08/10/2022 CLINICAL DATA:  Fall EXAM: CT HEAD WITHOUT CONTRAST CT CERVICAL SPINE WITHOUT CONTRAST TECHNIQUE: Multidetector CT imaging of the head and cervical spine was performed following the standard protocol without intravenous contrast. Multiplanar CT image reconstructions of the cervical spine were also generated. RADIATION DOSE REDUCTION: This exam was performed according to the departmental dose-optimization program which includes automated exposure control, adjustment of the mA and/or kV according to patient size and/or use of  iterative reconstruction technique. COMPARISON:  11/02/2021 CT head and cervical spine FINDINGS: CT HEAD FINDINGS Brain: No evidence of acute infarct, hemorrhage, mass, mass effect, or midline shift. No hydrocephalus or extra-axial fluid collection. Vascular: No hyperdense vessel. Skull: Left parietal scalp hematoma. Negative for fracture or focal lesion. Sinuses/Orbits: No acute finding. Other: The mastoid air cells are well aerated. CT CERVICAL SPINE FINDINGS Alignment: Straightening of the normal cervical lordosis. Skull base and vertebrae: Acute fracture  of the spinous process of C6 (series 5, image 40 and series 3, image 50). No acute vertebral body fracture or pedicle fracture. No suspicious osseous lesion. Soft tissues and spinal canal: No prevertebral fluid or swelling. No visible canal hematoma. Disc levels: Multilevel disc height loss without high-grade spinal canal stenosis. Upper chest: Unchanged 2 mm nodule in the right apex (series 4, image 89). No focal pulmonary opacity or pleural effusion. Other: None. IMPRESSION: 1. Acute fracture of the spinous process of C6. No vertebral body or pedicle fracture. 2. No acute intracranial process. Electronically Signed   By: Merilyn Baba M.D.   On: 08/10/2022 22:41   CT Cervical Spine Wo Contrast  Result Date: 08/10/2022 CLINICAL DATA:  Fall EXAM: CT HEAD WITHOUT CONTRAST CT CERVICAL SPINE WITHOUT CONTRAST TECHNIQUE: Multidetector CT imaging of the head and cervical spine was performed following the standard protocol without intravenous contrast. Multiplanar CT image reconstructions of the cervical spine were also generated. RADIATION DOSE REDUCTION: This exam was performed according to the departmental dose-optimization program which includes automated exposure control, adjustment of the mA and/or kV according to patient size and/or use of iterative reconstruction technique. COMPARISON:  11/02/2021 CT head and cervical spine FINDINGS: CT HEAD FINDINGS Brain:  No evidence of acute infarct, hemorrhage, mass, mass effect, or midline shift. No hydrocephalus or extra-axial fluid collection. Vascular: No hyperdense vessel. Skull: Left parietal scalp hematoma. Negative for fracture or focal lesion. Sinuses/Orbits: No acute finding. Other: The mastoid air cells are well aerated. CT CERVICAL SPINE FINDINGS Alignment: Straightening of the normal cervical lordosis. Skull base and vertebrae: Acute fracture of the spinous process of C6 (series 5, image 40 and series 3, image 50). No acute vertebral body fracture or pedicle fracture. No suspicious osseous lesion. Soft tissues and spinal canal: No prevertebral fluid or swelling. No visible canal hematoma. Disc levels: Multilevel disc height loss without high-grade spinal canal stenosis. Upper chest: Unchanged 2 mm nodule in the right apex (series 4, image 89). No focal pulmonary opacity or pleural effusion. Other: None. IMPRESSION: 1. Acute fracture of the spinous process of C6. No vertebral body or pedicle fracture. 2. No acute intracranial process. Electronically Signed   By: Merilyn Baba M.D.   On: 08/10/2022 22:41   DG Chest 1 View  Result Date: 08/10/2022 CLINICAL DATA:  Fall injury.  [id number]. EXAM: CHEST  1 VIEW COMPARISON:  Portable chest 01/17/2022. FINDINGS: The lungs are hyperaerated. Calcified granuloma is again noted in the left lower lung field. No focal pneumonia is seen. There is mild chronic elevation of the left diaphragm. The sulci are sharp. Heart size and vasculature are normal. Stable mediastinum with aortic atherosclerosis and tortuosity. Osteopenia and lower thoracic dextroscoliosis. IMPRESSION: No evidence of acute chest process. Stable hyperinflated chest. Aortic atherosclerosis. Electronically Signed   By: Telford Nab M.D.   On: 08/10/2022 22:39   DG Hip Unilat With Pelvis 2-3 Views Left  Result Date: 08/10/2022 CLINICAL DATA:  Left hip pain after fall EXAM: DG HIP (WITH OR WITHOUT PELVIS) 2-3V LEFT  COMPARISON:  Radiographs 12/10/2005 FINDINGS: Demineralization. Comminuted displaced intertrochanteric fracture of the left hip. There is slight apex lateral angulation and medial displacement of the distal fragment. The left femoral head is rotated superiorly but remains within the left acetabulum. No additional fractures. Degenerative arthritis right hip. Advanced degenerative changes lower lumbar spine. IMPRESSION: Comminuted displaced left intertrochanteric fracture. Electronically Signed   By: Placido Sou M.D.   On: 08/10/2022 22:32    EKG: I  independently viewed the EKG done and my findings are as followed: Sinus rhythm at a rate of 94 bpm  Assessment/Plan Present on Admission:  Closed comminuted intertrochanteric fracture of left femur, initial encounter ([place])  Alcohol dependence ([place])  Fall  Dehydration  Hyponatremia  Depression  Principal Problem:   Closed comminuted intertrochanteric fracture of left femur, initial encounter ([place]) Active Problems:   Depression   Hyponatremia   Alcohol dependence ([place])   Fall   Leukocytosis   Dehydration   Essential hypertension   Scalp hematoma   Fracture of cervical spinous process ([facility])   Failure to thrive in adult   Hypoalbuminemia due to protein-calorie malnutrition ([facility])   Hyperglycemia  Closed comminuted displaced left intertrochanteric fracture Unwitnessed fall at home Left hip x-ray showed comminuted displaced left intertrochanteric fracture Continue IV Dilaudid 0.5 mg every 4H as needed for moderate/severe pain Continue fall precaution and neurochecks N.p.o. at midnight Orthopedic surgeon was already consulted and will see patient in the morning per ED physician Consider PT/OT eval and treat status post surgical repair  Scalp laceration  This was already stapled Continue to monitor  Acute fracture of spinous process of C6 Neurosurgery was consulted and recommended neck collar per ED physician This was already  placed  Leukocytosis possibly reactive WBC 18.6, continue to monitor WBC with morning labs  Hyponatremia probably secondary to beer potomania/dehydration Na 129, continue IV hydration Continue to monitor sodium levels with normal  Failure to thrive in adult Hypoalbuminemia probably secondary to mild protein calorie malnutrition BMI 17.22; albumin 3.4 Protein supplement to be given Dietitian will be consulted and we shall await further recommendations  Hyperglycemia possibly reactive Blood glucose 162, continue to monitor CBG with morning labs  Acute kidney injury BUN/creatinine 18/1.31 (baseline creatinine at 0.9-1.0) Renally adjust medications, avoid nephrotoxic agents/dehydration/hypotension  Seizures Continue Keppra  Alcohol dependence Alcohol level was less than 10 Patient was counseled on alcohol use cessation Continue folic acid, thiamine and multivitamin  Essential hypertension (uncontrolled) Continue metoprolol  Depression Continue Lexapro   DVT prophylaxis: SCDs  Code Status: DNR  Consults: Orthopedic surgery  Family Communication: Caregiver, daughter on phone (all questions answered to satisfaction)  Severity of Illness: The appropriate patient status for this patient is INPATIENT. Inpatient status is judged to be reasonable and necessary in order to provide the required intensity of service to ensure the patient's safety. The patient's presenting symptoms, physical exam findings, and initial radiographic and laboratory data in the context of their chronic comorbidities is felt to place them at high risk for further clinical deterioration. Furthermore, it is not anticipated that the patient will be medically stable for discharge from the hospital within 2 midnights of admission.   * I certify that at the point of admission it is my clinical judgment that the patient will require inpatient hospital care spanning beyond 2 midnights from the point of admission  due to high intensity of service, high risk for further deterioration and high frequency of surveillance required.*  Author: Bernadette Hoit, DO 08/11/2022 1:08 AM  For on call review [email or web address].

## 2022-08-10 NOTE — ED Triage Notes (Signed)
Pt brought in by EMS after fall. Denies LOC. Pt with laceration to posterior scalp. Pt with c/o L hip pain.

## 2022-08-10 NOTE — ED Provider Notes (Signed)
[facility] EMERGENCY DEPARTMENT Provider Note   CSN: [id number] Arrival date & time: 08/10/22  2111     History {Add pertinent medical, surgical, social history, OB history to HPI:1} Chief Complaint  Patient presents with   [name] is a 81 y.o. female.  Patient has a history of seizures.  She had a fall today at home.  She did not have a seizure.   Fall       Home Medications Prior to Admission medications   Medication Sig Start Date End Date Taking? Authorizing Provider  Biotin 2.5 MG TABS Take 1 tablet by mouth daily.     [provider]  Calcium Carb-Cholecalciferol (CALCIUM + D3 PO) Take 1 tablet by mouth daily with breakfast.    [provider]  cycloSPORINE (RESTASIS) 0.05 % ophthalmic emulsion Place 1 drop into both eyes 2 (two) times daily.     [provider]  escitalopram (LEXAPRO) 20 MG tablet Take 1 tablet (20 mg total) by mouth in the morning. 05/25/21   Lilland, Alana, DO  folic acid (FOLVITE) 1 MG tablet Take 1 tablet (1 mg total) by mouth daily. 05/25/21   Lilland, Alana, DO  KEPPRA 500 MG tablet Take 1 tablet by mouth daily. 12/12/21   [provider]  metoprolol succinate (TOPROL-XL) 25 MG 24 hr tablet Take 1 tablet (25 mg total) by mouth daily. 01/24/22   Orson Eva, MD  Multiple Vitamin (MULTIVITAMIN WITH MINERALS) TABS tablet Take 1 tablet by mouth daily. 11/08/21   Mariel Aloe, MD  potassium citrate (UROCIT-K) 10 MEQ (1080 MG) SR tablet Take 10 mEq by mouth daily.  03/22/19   [provider]  thiamine 100 MG tablet Take 100 mg by mouth daily.     [provider]  vancomycin (VANCOCIN) 125 MG capsule Take 1 capsule (125 mg total) by mouth 4 (four) times daily. 01/24/22   Orson Eva, MD      Allergies    Clindamycin, Clindamycin/lincomycin, Doxycycline, and Doxycycline hyclate    Review of Systems   Review of Systems  Physical Exam Updated Vital Signs BP (!) 144/117 (BP Location:  Left Arm)   Pulse 97   Resp (!) 22   Ht 5' (1.524 m)   Wt 40 kg   SpO2 100%   BMI 17.22 kg/m  Physical Exam  ED Results / Procedures / Treatments   Labs (all labs ordered are listed, but only abnormal results are displayed) Labs Reviewed  CBC WITH DIFFERENTIAL/PLATELET - Abnormal; Notable for the following components:      Result Value   WBC 18.6 (*)    RBC 3.49 (*)    Hemoglobin 10.1 (*)    HCT 30.8 (*)    Neutro Abs 15.4 (*)    Monocytes Absolute 1.2 (*)    Abs Immature Granulocytes 0.14 (*)    All other components within normal limits  ETHANOL  PROTIME-INR  COMPREHENSIVE METABOLIC PANEL    EKG None  Radiology CT Head Wo Contrast  Result Date: 08/10/2022 CLINICAL DATA:  Fall EXAM: CT HEAD WITHOUT CONTRAST CT CERVICAL SPINE WITHOUT CONTRAST TECHNIQUE: Multidetector CT imaging of the head and cervical spine was performed following the standard protocol without intravenous contrast. Multiplanar CT image reconstructions of the cervical spine were also generated. RADIATION DOSE REDUCTION: This exam was performed according to the departmental dose-optimization program which includes automated exposure control, adjustment of the mA and/or kV according to patient size and/or use of  iterative reconstruction technique. COMPARISON:  11/02/2021 CT head and cervical spine FINDINGS: CT HEAD FINDINGS Brain: No evidence of acute infarct, hemorrhage, mass, mass effect, or midline shift. No hydrocephalus or extra-axial fluid collection. Vascular: No hyperdense vessel. Skull: Left parietal scalp hematoma. Negative for fracture or focal lesion. Sinuses/Orbits: No acute finding. Other: The mastoid air cells are well aerated. CT CERVICAL SPINE FINDINGS Alignment: Straightening of the normal cervical lordosis. Skull base and vertebrae: Acute fracture of the spinous process of C6 (series 5, image 40 and series 3, image 50). No acute vertebral body fracture or pedicle fracture. No suspicious osseous  lesion. Soft tissues and spinal canal: No prevertebral fluid or swelling. No visible canal hematoma. Disc levels: Multilevel disc height loss without high-grade spinal canal stenosis. Upper chest: Unchanged 2 mm nodule in the right apex (series 4, image 89). No focal pulmonary opacity or pleural effusion. Other: None. IMPRESSION: 1. Acute fracture of the spinous process of C6. No vertebral body or pedicle fracture. 2. No acute intracranial process. Electronically Signed   By: Merilyn Baba M.D.   On: 08/10/2022 22:41   CT Cervical Spine Wo Contrast  Result Date: 08/10/2022 CLINICAL DATA:  Fall EXAM: CT HEAD WITHOUT CONTRAST CT CERVICAL SPINE WITHOUT CONTRAST TECHNIQUE: Multidetector CT imaging of the head and cervical spine was performed following the standard protocol without intravenous contrast. Multiplanar CT image reconstructions of the cervical spine were also generated. RADIATION DOSE REDUCTION: This exam was performed according to the departmental dose-optimization program which includes automated exposure control, adjustment of the mA and/or kV according to patient size and/or use of iterative reconstruction technique. COMPARISON:  11/02/2021 CT head and cervical spine FINDINGS: CT HEAD FINDINGS Brain: No evidence of acute infarct, hemorrhage, mass, mass effect, or midline shift. No hydrocephalus or extra-axial fluid collection. Vascular: No hyperdense vessel. Skull: Left parietal scalp hematoma. Negative for fracture or focal lesion. Sinuses/Orbits: No acute finding. Other: The mastoid air cells are well aerated. CT CERVICAL SPINE FINDINGS Alignment: Straightening of the normal cervical lordosis. Skull base and vertebrae: Acute fracture of the spinous process of C6 (series 5, image 40 and series 3, image 50). No acute vertebral body fracture or pedicle fracture. No suspicious osseous lesion. Soft tissues and spinal canal: No prevertebral fluid or swelling. No visible canal hematoma. Disc levels:  Multilevel disc height loss without high-grade spinal canal stenosis. Upper chest: Unchanged 2 mm nodule in the right apex (series 4, image 89). No focal pulmonary opacity or pleural effusion. Other: None. IMPRESSION: 1. Acute fracture of the spinous process of C6. No vertebral body or pedicle fracture. 2. No acute intracranial process. Electronically Signed   By: Merilyn Baba M.D.   On: 08/10/2022 22:41   DG Chest 1 View  Result Date: 08/10/2022 CLINICAL DATA:  Fall injury.  [id number]. EXAM: CHEST  1 VIEW COMPARISON:  Portable chest 01/17/2022. FINDINGS: The lungs are hyperaerated. Calcified granuloma is again noted in the left lower lung field. No focal pneumonia is seen. There is mild chronic elevation of the left diaphragm. The sulci are sharp. Heart size and vasculature are normal. Stable mediastinum with aortic atherosclerosis and tortuosity. Osteopenia and lower thoracic dextroscoliosis. IMPRESSION: No evidence of acute chest process. Stable hyperinflated chest. Aortic atherosclerosis. Electronically Signed   By: Telford Nab M.D.   On: 08/10/2022 22:39   DG Hip Unilat With Pelvis 2-3 Views Left  Result Date: 08/10/2022 CLINICAL DATA:  Left hip pain after fall EXAM: DG HIP (WITH OR WITHOUT PELVIS) 2-3V LEFT  COMPARISON:  Radiographs 12/10/2005 FINDINGS: Demineralization. Comminuted displaced intertrochanteric fracture of the left hip. There is slight apex lateral angulation and medial displacement of the distal fragment. The left femoral head is rotated superiorly but remains within the left acetabulum. No additional fractures. Degenerative arthritis right hip. Advanced degenerative changes lower lumbar spine. IMPRESSION: Comminuted displaced left intertrochanteric fracture. Electronically Signed   By: Placido Sou M.D.   On: 08/10/2022 22:32    Procedures Procedures  {Document cardiac monitor, telemetry assessment procedure when appropriate:1}  Medications Ordered in ED Medications   ceFAZolin (ANCEF) IVPB 2g/100 mL premix (has no administration in time range)  tranexamic acid (CYKLOKAPRON) IVPB 1,000 mg (has no administration in time range)  sodium chloride 0.9 % bolus 500 mL (0 mLs Intravenous Stopped 08/10/22 2313)  ondansetron (ZOFRAN) injection 4 mg (4 mg Intravenous Given 08/10/22 2243)  HYDROmorphone (DILAUDID) injection 0.5 mg (0.5 mg Intravenous Given 08/10/22 2243)  hydrogen peroxide 3 % external solution (  Given 08/10/22 2244)    ED Course/ Medical Decision Making/ A&P  CRITICAL CARE Performed by: Milton Ferguson Total critical care time: 40 minutes Critical care time was exclusive of separately billable procedures and treating other patients. Critical care was necessary to treat or prevent imminent or life-threatening deterioration. Critical care was time spent personally by me on the following activities: development of treatment plan with patient and/or surrogate as well as nursing, discussions with consultants, evaluation of patient's response to treatment, examination of patient, obtaining history from patient or surrogate, ordering and performing treatments and interventions, ordering and review of laboratory studies, ordering and review of radiographic studies, pulse oximetry and re-evaluation of patient's condition.   Patient has a comminuted inotrope fracture on the left of her left hip.  She also has a C6 spinous process fracture.  Patient also had 2 lacerations to her scalp one was 3 cm and the other was 2 cm.  She had 8 staples used to close these lacerations.  I spoke with orthopedics and they want the patient n.p.o. after midnight and will repair her hip tomorrow.  I also spoke to neurosurgery and they recommend a soft collar for her neck for her spinous process fracture at C6 and she can follow-up with neurosurgery in 2 to 3 weeks                         Medical Decision Making Amount and/or Complexity of Data Reviewed Labs: ordered. Radiology:  ordered.  Risk Prescription drug management. Decision regarding hospitalization.  Fall with lacerations to the back of the head.  Left hip fracture, and spinous fracture C6  {Document critical care time when appropriate:1} {Document review of labs and clinical decision tools ie heart score, Chads2Vasc2 etc:1}  {Document your independent review of radiology images, and any outside records:1} {Document your discussion with family members, caretakers, and with consultants:1} {Document social determinants of health affecting pt's care:1} {Document your decision making why or why not admission, treatments were needed:1} Final Clinical Impression(s) / ED Diagnoses Final diagnoses:  Fall, initial encounter    Rx / DC Orders ED Discharge Orders     None

## 2022-08-11 ENCOUNTER — Other Ambulatory Visit: Payer: Self-pay

## 2022-08-11 ENCOUNTER — Inpatient Hospital Stay (HOSPITAL_COMMUNITY): Payer: Medicare Other

## 2022-08-11 ENCOUNTER — Encounter (HOSPITAL_COMMUNITY)
Admission: EM | Disposition: A | Discharge status: Skilled Nursing Facility | Payer: Self-pay | Source: Home / Self Care | Attending: Family Medicine

## 2022-08-11 ENCOUNTER — Encounter (HOSPITAL_COMMUNITY): Payer: Self-pay | Admitting: Internal Medicine

## 2022-08-11 ENCOUNTER — Inpatient Hospital Stay (HOSPITAL_COMMUNITY): Payer: Medicare Other | Admitting: Anesthesiology

## 2022-08-11 DIAGNOSIS — Z515 Encounter for palliative care: Secondary | ICD-10-CM | POA: Diagnosis not present

## 2022-08-11 DIAGNOSIS — I10 Essential (primary) hypertension: Secondary | ICD-10-CM | POA: Diagnosis not present

## 2022-08-11 DIAGNOSIS — S72142A Displaced intertrochanteric fracture of left femur, initial encounter for closed fracture: Secondary | ICD-10-CM

## 2022-08-11 DIAGNOSIS — D649 Anemia, unspecified: Secondary | ICD-10-CM | POA: Diagnosis not present

## 2022-08-11 DIAGNOSIS — F1020 Alcohol dependence, uncomplicated: Secondary | ICD-10-CM | POA: Diagnosis not present

## 2022-08-11 DIAGNOSIS — Z87891 Personal history of nicotine dependence: Secondary | ICD-10-CM | POA: Diagnosis not present

## 2022-08-11 DIAGNOSIS — R739 Hyperglycemia, unspecified: Secondary | ICD-10-CM

## 2022-08-11 DIAGNOSIS — Z7189 Other specified counseling: Secondary | ICD-10-CM | POA: Diagnosis not present

## 2022-08-11 DIAGNOSIS — E46 Unspecified protein-calorie malnutrition: Secondary | ICD-10-CM

## 2022-08-11 DIAGNOSIS — S129XXA Fracture of neck, unspecified, initial encounter: Secondary | ICD-10-CM

## 2022-08-11 DIAGNOSIS — R627 Adult failure to thrive: Secondary | ICD-10-CM

## 2022-08-11 DIAGNOSIS — S0003XA Contusion of scalp, initial encounter: Secondary | ICD-10-CM

## 2022-08-11 HISTORY — PX: INTRAMEDULLARY (IM) NAIL INTERTROCHANTERIC: SHX5875

## 2022-08-11 LAB — MAGNESIUM: Magnesium: 1.8 mg/dL (ref 1.7–2.4)

## 2022-08-11 LAB — PREPARE RBC (CROSSMATCH)

## 2022-08-11 LAB — PHOSPHORUS: Phosphorus: 4.9 mg/dL — ABNORMAL HIGH (ref 2.5–4.6)

## 2022-08-11 LAB — CBC
HCT: 25.5 % — ABNORMAL LOW (ref 36.0–46.0)
Hemoglobin: 8.2 g/dL — ABNORMAL LOW (ref 12.0–15.0)
MCH: 28.9 pg (ref 26.0–34.0)
MCHC: 32.2 g/dL (ref 30.0–36.0)
MCV: 89.8 fL (ref 80.0–100.0)
Platelets: 129 10*3/uL — ABNORMAL LOW (ref 150–400)
RBC: 2.84 MIL/uL — ABNORMAL LOW (ref 3.87–5.11)
RDW: 15.1 % (ref 11.5–15.5)
WBC: 14 10*3/uL — ABNORMAL HIGH (ref 4.0–10.5)
nRBC: 0 % (ref 0.0–0.2)

## 2022-08-11 LAB — COMPREHENSIVE METABOLIC PANEL
ALT: 13 U/L (ref 0–44)
AST: 22 U/L (ref 15–41)
Albumin: 3 g/dL — ABNORMAL LOW (ref 3.5–5.0)
Alkaline Phosphatase: 70 U/L (ref 38–126)
Anion gap: 6 (ref 5–15)
BUN: 19 mg/dL (ref 8–23)
CO2: 24 mmol/L (ref 22–32)
Calcium: 8.3 mg/dL — ABNORMAL LOW (ref 8.9–10.3)
Chloride: 106 mmol/L (ref 98–111)
Creatinine, Ser: 1.26 mg/dL — ABNORMAL HIGH (ref 0.44–1.00)
GFR, Estimated: 43 mL/min — ABNORMAL LOW (ref >60)
Glucose, Bld: 154 mg/dL — ABNORMAL HIGH (ref 70–99)
Potassium: 4.4 mmol/L (ref 3.5–5.1)
Sodium: 136 mmol/L (ref 135–145)
Total Bilirubin: 0.8 mg/dL (ref 0.3–1.2)
Total Protein: 5.3 g/dL — ABNORMAL LOW (ref 6.5–8.1)

## 2022-08-11 LAB — SURGICAL PCR SCREEN
MRSA, PCR: NEGATIVE
Staphylococcus aureus: POSITIVE — AB

## 2022-08-11 SURGERY — FIXATION, FRACTURE, INTERTROCHANTERIC, WITH INTRAMEDULLARY ROD
Anesthesia: General | Site: Hip | Laterality: Left

## 2022-08-11 MED ORDER — ADULT MULTIVITAMIN W/MINERALS CH
1.0000 | ORAL_TABLET | Freq: Every day | ORAL | Status: DC
  Administered 2022-08-12 – 2022-08-14 (×3): 1 via ORAL
  Filled 2022-08-11 (×3): qty 1

## 2022-08-11 MED ORDER — ROCURONIUM BROMIDE 10 MG/ML (PF) SYRINGE
PREFILLED_SYRINGE | INTRAVENOUS | Status: DC | PRN
  Administered 2022-08-11: 40 mg via INTRAVENOUS

## 2022-08-11 MED ORDER — MUPIROCIN 2 % EX OINT
1.0000 | TOPICAL_OINTMENT | Freq: Two times a day (BID) | CUTANEOUS | Status: DC
  Administered 2022-08-11 – 2022-08-14 (×5): 1 via NASAL
  Filled 2022-08-11: qty 22

## 2022-08-11 MED ORDER — SODIUM CHLORIDE 0.9% IV SOLUTION: Freq: Once | INTRAVENOUS | Status: AC

## 2022-08-11 MED ORDER — ORAL CARE MOUTH RINSE: 15.0000 mL | Freq: Once | OROMUCOSAL | Status: AC

## 2022-08-11 MED ORDER — ONDANSETRON HCL 4 MG/2ML IJ SOLN
4.0000 mg | Freq: Once | INTRAMUSCULAR | Status: DC | PRN
Start: 2022-08-11 — End: 2022-08-11

## 2022-08-11 MED ORDER — HYDROMORPHONE HCL 1 MG/ML IJ SOLN
INTRAMUSCULAR | Status: AC
  Filled 2022-08-11: qty 0.5

## 2022-08-11 MED ORDER — ACETAMINOPHEN 325 MG PO TABS: 650.0000 mg | ORAL_TABLET | Freq: Four times a day (QID) | ORAL | Status: DC | PRN

## 2022-08-11 MED ORDER — FENTANYL CITRATE (PF) 100 MCG/2ML IJ SOLN
INTRAMUSCULAR | Status: AC
  Filled 2022-08-11: qty 2

## 2022-08-11 MED ORDER — ESCITALOPRAM OXALATE 10 MG PO TABS
20.0000 mg | ORAL_TABLET | Freq: Every morning | ORAL | Status: DC
  Administered 2022-08-12 – 2022-08-14 (×3): 20 mg via ORAL
  Filled 2022-08-11 (×3): qty 2

## 2022-08-11 MED ORDER — PHENYLEPHRINE HCL (PRESSORS) 10 MG/ML IV SOLN
INTRAVENOUS | Status: AC
  Filled 2022-08-11: qty 1

## 2022-08-11 MED ORDER — THIAMINE HCL 100 MG PO TABS
100.0000 mg | ORAL_TABLET | Freq: Every day | ORAL | Status: DC
  Administered 2022-08-12 – 2022-08-14 (×3): 100 mg via ORAL
  Filled 2022-08-11 (×8): qty 1

## 2022-08-11 MED ORDER — SODIUM CHLORIDE 0.9 % IV SOLN: INTRAVENOUS | Status: DC

## 2022-08-11 MED ORDER — BUPIVACAINE HCL (PF) 0.5 % IJ SOLN
INTRAMUSCULAR | Status: AC
  Filled 2022-08-11: qty 30

## 2022-08-11 MED ORDER — PHENYLEPHRINE 80 MCG/ML (10ML) SYRINGE FOR IV PUSH (FOR BLOOD PRESSURE SUPPORT)
PREFILLED_SYRINGE | INTRAVENOUS | Status: DC | PRN
  Administered 2022-08-11 (×4): 160 ug via INTRAVENOUS

## 2022-08-11 MED ORDER — LORAZEPAM 1 MG PO TABS: 1.0000 mg | ORAL_TABLET | ORAL | Status: DC | PRN

## 2022-08-11 MED ORDER — MORPHINE SULFATE (PF) 2 MG/ML IV SOLN
0.5000 mg | INTRAVENOUS | Status: DC | PRN
  Administered 2022-08-11: 0.5 mg via INTRAVENOUS
  Filled 2022-08-11 (×2): qty 1

## 2022-08-11 MED ORDER — ONDANSETRON HCL 4 MG/2ML IJ SOLN
INTRAMUSCULAR | Status: AC
  Filled 2022-08-11: qty 2

## 2022-08-11 MED ORDER — ENOXAPARIN SODIUM 30 MG/0.3ML IJ SOSY
30.0000 mg | PREFILLED_SYRINGE | INTRAMUSCULAR | Status: DC
  Administered 2022-08-12 – 2022-08-14 (×3): 30 mg via SUBCUTANEOUS
  Filled 2022-08-11 (×3): qty 0.3

## 2022-08-11 MED ORDER — HYDRALAZINE HCL 20 MG/ML IJ SOLN: 10.0000 mg | Freq: Four times a day (QID) | INTRAMUSCULAR | Status: DC | PRN

## 2022-08-11 MED ORDER — ACETAMINOPHEN 650 MG RE SUPP: 650.0000 mg | Freq: Four times a day (QID) | RECTAL | Status: DC | PRN

## 2022-08-11 MED ORDER — CEFAZOLIN SODIUM-DEXTROSE 2-4 GM/100ML-% IV SOLN
2.0000 g | Freq: Two times a day (BID) | INTRAVENOUS | Status: AC
  Administered 2022-08-11 – 2022-08-12 (×2): 2 g via INTRAVENOUS
  Filled 2022-08-11 (×2): qty 100

## 2022-08-11 MED ORDER — SODIUM CHLORIDE 0.9% IV SOLUTION: Freq: Once | INTRAVENOUS | Status: DC

## 2022-08-11 MED ORDER — HYDROMORPHONE HCL 1 MG/ML IJ SOLN
0.5000 mg | Freq: Once | INTRAMUSCULAR | Status: AC
  Administered 2022-08-11: 0.5 mg via INTRAVENOUS

## 2022-08-11 MED ORDER — DIAZEPAM 5 MG PO TABS
2.5000 mg | ORAL_TABLET | Freq: Three times a day (TID) | ORAL | Status: DC
  Administered 2022-08-11 – 2022-08-12 (×3): 2.5 mg via ORAL
  Filled 2022-08-11 (×3): qty 1

## 2022-08-11 MED ORDER — FOLIC ACID 1 MG PO TABS
1.0000 mg | ORAL_TABLET | Freq: Every day | ORAL | Status: DC
  Administered 2022-08-12 – 2022-08-14 (×3): 1 mg via ORAL
  Filled 2022-08-11 (×3): qty 1

## 2022-08-11 MED ORDER — LIDOCAINE HCL (PF) 2 % IJ SOLN
INTRAMUSCULAR | Status: AC
  Filled 2022-08-11: qty 5

## 2022-08-11 MED ORDER — LORAZEPAM 2 MG/ML IJ SOLN: 1.0000 mg | INTRAMUSCULAR | Status: DC | PRN

## 2022-08-11 MED ORDER — PHENYLEPHRINE HCL-NACL 20-0.9 MG/250ML-% IV SOLN
INTRAVENOUS | Status: DC | PRN
  Administered 2022-08-11: 25 ug/min via INTRAVENOUS

## 2022-08-11 MED ORDER — PROPOFOL 10 MG/ML IV BOLUS
INTRAVENOUS | Status: DC | PRN
  Administered 2022-08-11: 80 mg via INTRAVENOUS

## 2022-08-11 MED ORDER — ONDANSETRON HCL 4 MG/2ML IJ SOLN
INTRAMUSCULAR | Status: DC | PRN
  Administered 2022-08-11: 4 mg via INTRAVENOUS

## 2022-08-11 MED ORDER — SUGAMMADEX SODIUM 200 MG/2ML IV SOLN
INTRAVENOUS | Status: DC | PRN
  Administered 2022-08-11: 200 mg via INTRAVENOUS

## 2022-08-11 MED ORDER — FENTANYL CITRATE (PF) 100 MCG/2ML IJ SOLN
INTRAMUSCULAR | Status: DC | PRN
Start: 2022-08-11 — End: 2022-08-11
  Administered 2022-08-11 (×2): 50 ug via INTRAVENOUS

## 2022-08-11 MED ORDER — ROCURONIUM BROMIDE 10 MG/ML (PF) SYRINGE
PREFILLED_SYRINGE | INTRAVENOUS | Status: AC
  Filled 2022-08-11: qty 10

## 2022-08-11 MED ORDER — CEFAZOLIN SODIUM-DEXTROSE 2-4 GM/100ML-% IV SOLN: 2.0000 g | INTRAVENOUS | Status: DC

## 2022-08-11 MED ORDER — GLUCERNA SHAKE PO LIQD
237.0000 mL | Freq: Three times a day (TID) | ORAL | Status: DC
  Administered 2022-08-11: 237 mL via ORAL

## 2022-08-11 MED ORDER — HYDROMORPHONE HCL 1 MG/ML IJ SOLN
0.2500 mg | INTRAMUSCULAR | Status: DC | PRN
  Administered 2022-08-11: 0.5 mg via INTRAVENOUS
  Filled 2022-08-11 (×2): qty 0.5

## 2022-08-11 MED ORDER — CHLORHEXIDINE GLUCONATE 0.12 % MT SOLN
15.0000 mL | Freq: Once | OROMUCOSAL | Status: AC
  Administered 2022-08-11: 15 mL via OROMUCOSAL

## 2022-08-11 MED ORDER — METOPROLOL SUCCINATE ER 25 MG PO TB24
25.0000 mg | ORAL_TABLET | Freq: Every day | ORAL | Status: DC
  Administered 2022-08-12 – 2022-08-14 (×3): 25 mg via ORAL
  Filled 2022-08-11 (×3): qty 1

## 2022-08-11 MED ORDER — HYDROCODONE-ACETAMINOPHEN 5-325 MG PO TABS: 1.0000 | ORAL_TABLET | Freq: Four times a day (QID) | ORAL | Status: DC | PRN

## 2022-08-11 MED ORDER — DEXAMETHASONE SODIUM PHOSPHATE 10 MG/ML IJ SOLN
INTRAMUSCULAR | Status: AC
  Filled 2022-08-11: qty 2

## 2022-08-11 MED ORDER — SODIUM CHLORIDE 0.9 % IR SOLN
Status: DC | PRN
  Administered 2022-08-11: 1000 mL

## 2022-08-11 MED ORDER — DEXAMETHASONE SODIUM PHOSPHATE 10 MG/ML IJ SOLN
INTRAMUSCULAR | Status: DC | PRN
  Administered 2022-08-11: 10 mg via INTRAVENOUS

## 2022-08-11 MED ORDER — LEVETIRACETAM 500 MG PO TABS
500.0000 mg | ORAL_TABLET | Freq: Every day | ORAL | Status: DC
  Administered 2022-08-12 – 2022-08-14 (×3): 500 mg via ORAL
  Filled 2022-08-11 (×3): qty 1

## 2022-08-11 MED ORDER — LACTATED RINGERS IV SOLN: INTRAVENOUS | Status: DC

## 2022-08-11 MED ORDER — BUPIVACAINE HCL (PF) 0.5 % IJ SOLN
INTRAMUSCULAR | Status: DC | PRN
  Administered 2022-08-11: 30 mL

## 2022-08-11 MED ORDER — DEXTROSE-NACL 5-0.9 % IV SOLN: INTRAVENOUS | Status: DC

## 2022-08-11 MED ORDER — LIDOCAINE 2% (20 MG/ML) 5 ML SYRINGE
INTRAMUSCULAR | Status: DC | PRN
  Administered 2022-08-11: 40 mg via INTRAVENOUS

## 2022-08-11 SURGICAL SUPPLY — 57 items
APL PRP STRL LF DISP 70% ISPRP (MISCELLANEOUS) ×1
BIT DRILL 4.0X280 (BIT) IMPLANT
BIT DRILL AO GAMMA 4.2X180 (BIT) IMPLANT
BLADE SURG SZ10 CARB STEEL (BLADE) ×1 IMPLANT
BNDG GAUZE ELAST 4 BULKY (GAUZE/BANDAGES/DRESSINGS) ×1 IMPLANT
BRUSH SCRUB EZ W/ULTRADEX 3%PC (MISCELLANEOUS) IMPLANT
CHLORAPREP W/TINT 26 (MISCELLANEOUS) ×1 IMPLANT
CLOTH BEACON ORANGE TIMEOUT ST (SAFETY) ×1 IMPLANT
COVER LIGHT HANDLE STERIS (MISCELLANEOUS) ×2 IMPLANT
COVER MAYO STAND XLG (MISCELLANEOUS) ×1 IMPLANT
COVER PERINEAL POST (MISCELLANEOUS) ×1 IMPLANT
DECANTER SPIKE VIAL GLASS SM (MISCELLANEOUS) ×1 IMPLANT
DRAPE STERI IOBAN 125X83 (DRAPES) ×1 IMPLANT
DRSG MEPILEX SACRM 8.7X9.8 (GAUZE/BANDAGES/DRESSINGS) ×1 IMPLANT
DRSG PAD ABDOMINAL 8X10 ST (GAUZE/BANDAGES/DRESSINGS) ×1 IMPLANT
DRSG TEGADERM 4X4.75 (GAUZE/BANDAGES/DRESSINGS) ×4 IMPLANT
ELECT REM PT RETURN 9FT ADLT (ELECTROSURGICAL) ×1
ELECTRODE REM PT RTRN 9FT ADLT (ELECTROSURGICAL) ×1 IMPLANT
GAUZE SPONGE 4X4 12PLY STRL (GAUZE/BANDAGES/DRESSINGS) ×1 IMPLANT
GAUZE XEROFORM 1X8 LF (GAUZE/BANDAGES/DRESSINGS) ×2 IMPLANT
GLOVE BIO SURGEON STRL SZ8 (GLOVE) ×2 IMPLANT
GLOVE BIOGEL PI IND STRL 7.0 (GLOVE) ×2 IMPLANT
GLOVE SRG 8 PF TXTR STRL LF DI (GLOVE) ×1 IMPLANT
GLOVE SURG UNDER POLY LF SZ8 (GLOVE) ×1
GOWN STRL REUS W/ TWL XL LVL3 (GOWN DISPOSABLE) ×1 IMPLANT
GOWN STRL REUS W/TWL LRG LVL3 (GOWN DISPOSABLE) ×1 IMPLANT
GOWN STRL REUS W/TWL XL LVL3 (GOWN DISPOSABLE) ×1
GUIDE PIN 3.2X330 (PIN) ×1
GUIDEWIRE BALL NOSE 3.0X900 (WIRE) ×1
GUIDEWIRE ORTH 900X3XBALL NOSE (WIRE) IMPLANT
INST SET MAJOR BONE (KITS) ×1 IMPLANT
KIT BLADEGUARD II DBL (SET/KITS/TRAYS/PACK) ×1 IMPLANT
KIT TURNOVER CYSTO (KITS) ×1 IMPLANT
MANIFOLD NEPTUNE II (INSTRUMENTS) ×1 IMPLANT
MARKER SKIN DUAL TIP RULER LAB (MISCELLANEOUS) ×1 IMPLANT
NAIL ES TROCH L 10X36X125 (Nail) IMPLANT
NDL HYPO 21X1.5 SAFETY (NEEDLE) ×1 IMPLANT
NEEDLE HYPO 21X1.5 SAFETY (NEEDLE) ×1 IMPLANT
NS IRRIG 1000ML POUR BTL (IV SOLUTION) ×1 IMPLANT
PACK BASIC III (CUSTOM PROCEDURE TRAY) ×1
PACK SRG BSC III STRL LF ECLPS (CUSTOM PROCEDURE TRAY) ×1 IMPLANT
PAD ARMBOARD 7.5X6 YLW CONV (MISCELLANEOUS) ×1 IMPLANT
PENCIL SMOKE EVACUATOR COATED (MISCELLANEOUS) ×1 IMPLANT
PIN GUIDE 3.2X330 (PIN) IMPLANT
SCREW LOCK CORT 5.0X38 (Screw) IMPLANT
SCREW LOCK LAG 10.5X85 GALILEO (Screw) IMPLANT
SET BASIN LINEN APH (SET/KITS/TRAYS/PACK) ×1 IMPLANT
SPONGE T-LAP 18X18 ~~LOC~~+RFID (SPONGE) ×2 IMPLANT
STAPLER VISISTAT (STAPLE) IMPLANT
SUT MON AB 2-0 CT1 36 (SUTURE) ×1 IMPLANT
SUT VIC AB 0 CT1 27 (SUTURE) ×1
SUT VIC AB 0 CT1 27XBRD ANTBC (SUTURE) ×1 IMPLANT
SYR 30ML LL (SYRINGE) ×1 IMPLANT
SYR BULB IRRIG 60ML STRL (SYRINGE) ×2 IMPLANT
TOOL ACTIVATION (INSTRUMENTS) IMPLANT
TRAY FOLEY MTR SLVR 16FR STAT (SET/KITS/TRAYS/PACK) ×1 IMPLANT
YANKAUER SUCT BULB TIP 10FT TU (MISCELLANEOUS) ×1 IMPLANT

## 2022-08-11 NOTE — Progress Notes (Signed)
   08/11/22 0047  Assess: MEWS Score  Temp 97.7 F (36.5 C)  BP (!) 150/98  MAP (mmHg) 114  Pulse Rate (!) 115  Resp 20  SpO2 98 %  O2 Device Room Air  Assess: MEWS Score  MEWS Temp 0  MEWS Systolic 0  MEWS Pulse 2  MEWS RR 0  MEWS LOC 0  MEWS Score 2  MEWS Score Color Yellow  Treat  Pain Scale 0-10  Pain Score 10  Pain Type Acute pain  Pain Location Hip  Pain Orientation Left  Pain Intervention(s) Medication (See eMAR)  Notify: Charge Nurse/RN  Name of Charge Nurse/RN Notified Grace Isaac, RN  Date Charge Nurse/RN Notified 08/11/22  Time Charge Nurse/RN Notified 0047  Assess: SIRS CRITERIA  SIRS Temperature  0  SIRS Pulse 1  SIRS Respirations  0  SIRS WBC 1  SIRS Score Sum  2

## 2022-08-11 NOTE — Anesthesia Procedure Notes (Signed)
Procedure Name: Intubation Date/Time: 08/11/2022 9:20 AM  Performed by: Myna Bright, CRNAPre-anesthesia Checklist: Patient identified, Emergency Drugs available, Suction available and Patient being monitored Patient Re-evaluated:Patient Re-evaluated prior to induction Oxygen Delivery Method: Circle system utilized Preoxygenation: Pre-oxygenation with 100% oxygen Induction Type: IV induction Ventilation: Mask ventilation without difficulty Laryngoscope Size: Glidescope and 3 Tube type: Oral Tube size: 6.5 mm Number of attempts: 1 Airway Equipment and Method: Stylet and Video-laryngoscopy Placement Confirmation: ETT inserted through vocal cords under direct vision, positive ETCO2 and breath sounds checked- equal and bilateral Secured at: 20 cm Tube secured with: Tape Dental Injury: Teeth and Oropharynx as per pre-operative assessment  Difficulty Due To: Difficulty was anticipated and Difficult Airway- due to cervical collar Comments: Patient with C6 spinous process fracture, in C-collar. Front of collar removed and inline stabilization by Dr. Charna Elizabeth during intubation. Easy mask airway. DL x 1 with Glide, good view, atraumatic intubation. Front of collar replaced. Head/neck neutral throughout procedure.

## 2022-08-11 NOTE — Progress Notes (Signed)
PROGRESS NOTE     [name], is a 81 y.o. female, DOB - [birth year], BSJ:[id number]  Admit date - 08/10/2022   Admitting Physician Bernadette Hoit, DO  Outpatient Primary MD for the patient is Asencion Noble, MD  LOS - 1  Chief Complaint  Patient presents with   Fall        Brief Narrative:  81 y.o. female with medical history significant of hypertension, seizures, alcohol dependence, depression admitted on 08/11/2019 treated with left hip fracture mechanical fall at home in the setting of history of EtOH use   -Assessment and Plan: 1)Lt Hip Fx--left intertrochanteric femur fracture; reverse obliquity pattern On 08/11/22 underwent Cephalomedullary nail for left intertrochanteric femur fracture -Post-operative plan:  Weightbearing: The patient will be WBAT on the operative extremity.   DVT prophylaxis -start Lovenox on 08/12/2022 Pain control with PRN pain medication  Dressing can be reinforced as needed, will change on POD#2/3 if needed.   Follow up plan: approximately 2 weeks postop for incision check and XR.  If the patient will be returning to a nursing facility, staples can be removed around this time and a follow up appointment can be scheduled for 6 weeks after surgery. XR at next visit:  please obtain AP pelvis, and 2 views of the Left hip/femur  2)Acute fracture of the spinous process of C6. No vertebral body or pedicle fracture--- --Neurosurgery was consulted in relation to C6 spinous process noted on CT cervical spine and recommended neck collar  3)Closed head injury/scalp laceration--- head wound is hemostatic -CT head without acute findings -Supportive care  4)AKI----acute kidney injury - -continue IV fluids until oral intake is more reliable -- renally adjust medications, avoid nephrotoxic agents / dehydration  / hypotension  5)Alcohol Abuse--- risk of DTs -Benzos per CIWA protocol -Multivitamins ordered  6)HTN--continue metoprolol .  IV hydralazine as  needed better BP  7) acute hypoxic respiratory failure--- transient hypoxia appears to have resolved -Patient successfully weaned off oxygen on 08/11/2022  Disposition/Need for in-Hospital Stay- patient unable to be discharged at this time due to hip fracture status post ORIF awaiting SNF placement for rehab  Status is: Inpatient   Disposition: The patient is from: Home              Anticipated d/c is to: SNF              Anticipated d/c date is: 2 days              Patient currently is not medically stable to d/c. Barriers: Not Clinically Stable-   Code Status :  -  Code Status: DNR   Family Communication:    Discussed with caregiver at bedside DVT Prophylaxis  :   - SCDs  SCDs Start: 08/11/22 0100 SCDs Start: 08/11/22 0043   Lab Results  Component Value Date   PLT 129 (L) 08/11/2022    Inpatient Medications  Scheduled Meds:  diazepam  2.5 mg Oral TID   escitalopram  20 mg Oral q AM   feeding supplement (GLUCERNA SHAKE)  237 mL Oral TID BM   folic acid  1 mg Oral Daily   levETIRAcetam  500 mg Oral Daily   metoprolol succinate  25 mg Oral Daily   multivitamin with minerals  1 tablet Oral Daily   mupirocin ointment  1 Application Nasal BID   thiamine  100 mg Oral Daily   Continuous Infusions:  sodium chloride 50 mL/hr at 08/11/22 0913    ceFAZolin (ANCEF) IV  2 g (08/11/22 1759)   dextrose 5 % and 0.9% NaCl     PRN Meds:.acetaminophen **OR** acetaminophen, HYDROcodone-acetaminophen, HYDROmorphone (DILAUDID) injection, morphine injection   Anti-infectives (From admission, onward)    Start     Dose/Rate Route Frequency Ordered Stop   08/11/22 1800  ceFAZolin (ANCEF) IVPB 2g/100 mL premix        2 g 200 mL/hr over 30 Minutes Intravenous Every 12 hours 08/11/22 1314 08/12/22 1759   08/11/22 1600  ceFAZolin (ANCEF) IVPB 2g/100 mL premix  Status:  Discontinued        2 g 200 mL/hr over 30 Minutes Intravenous On call to O.R. 08/11/22 1225 08/11/22 1314   08/11/22 0600   ceFAZolin (ANCEF) IVPB 2g/100 mL premix        2 g 200 mL/hr over 30 Minutes Intravenous On call to O.R. 08/10/22 2304 08/11/22 0939         Subjective: [name] today has no fevers, no emesis,  No chest pain,   -Resting comfortably -Caregiver at bedside  Objective: Vitals:   08/11/22 1115 08/11/22 1130 08/11/22 1145 08/11/22 1624  BP: (!) 187/89 (!) 129/58 107/74 121/66  Pulse: (!) 117 (!) 120 (!) 115 (!) 110  Resp: (!) '24 12 12 17  '$ Temp: 97.6 F (36.4 C)   98.4 F (36.9 C)  TempSrc:    Oral  SpO2: 100% 100% 100% 96%  Weight:      Height:        Intake/Output Summary (Last 24 hours) at 08/11/2022 1844 Last data filed at 08/11/2022 1759 Gross per 24 hour  Intake 1612.36 ml  Output 575 ml  Net 1037.36 ml   Filed Weights   08/10/22 2119  Weight: 40 kg   Physical Exam  Gen:- Awake Alert,  in no apparent distress  HEENT:- No sclera icterus, scalp wound is stapled and hemostatic Neck-Supple Neck,No JVD,.  Lungs-  CTAB , fair symmetrical air movement CV- S1, S2 normal, regular  Abd-  +ve B.Sounds, Abd Soft, No tenderness,    Extremity/Skin:- No  edema, pedal pulses present  Psych-affect is appropriate, oriented x3 Neuro-generalized weakness, no new focal deficits, no tremors MSK-left hip postop wound clean and intact  Data Reviewed: I have personally reviewed following labs and imaging studies  CBC: Recent Labs  Lab 08/10/22 2244 08/11/22 0505  WBC 18.6* 14.0*  NEUTROABS 15.4*  --   HGB 10.1* 8.2*  HCT 30.8* 25.5*  MCV 88.3 89.8  PLT 164 326*   Basic Metabolic Panel: Recent Labs  Lab 08/10/22 2244 08/11/22 0505  NA 129* 136  K 3.7 4.4  CL 96* 106  CO2 22 24  GLUCOSE 162* 154*  BUN 18 19  CREATININE 1.31* 1.26*  CALCIUM 8.5* 8.3*  MG  --  1.8  PHOS  --  4.9*   GFR: Estimated Creatinine Clearance: 22.5 mL/min (A) (by C-G formula based on SCr of 1.26 mg/dL (H)). Liver Function Tests: Recent Labs  Lab 08/10/22 2244 08/11/22 0505  AST  25 22  ALT 15 13  ALKPHOS 87 70  BILITOT 0.9 0.8  PROT 6.1* 5.3*  ALBUMIN 3.4* 3.0*   Recent Results (from the past 240 hour(s))  Surgical PCR screen     Status: Abnormal   Collection Time: 08/11/22  5:29 AM   Specimen: Nasal Mucosa; Nasal Swab  Result Value Ref Range Status   MRSA, PCR NEGATIVE NEGATIVE Final   Staphylococcus aureus POSITIVE (A) NEGATIVE Final    Comment: RESULT CALLED  TO, READ BACK BY AND VERIFIED WITH: S. BLACKWELL @ [phone] BY STEPHTR 08/11/22 (NOTE) The Xpert SA Assay (FDA approved for NASAL specimens in patients 93 years of age and older), is one component of a comprehensive surveillance program. It is not intended to diagnose infection nor to guide or monitor treatment. Performed at [facility], [address] [place], [place] [address]     Radiology Studies: DG FEMUR MIN 2 VIEWS LEFT  Result Date: 08/11/2022 CLINICAL DATA:  Status post left intramedullary nail fixation of left hip EXAM: LEFT FEMUR 2 VIEWS COMPARISON:  Pelvis and left hip radiographs 08/10/2022 FINDINGS: Interval left femoral long intramedullary nail fixation of the previously seen comminuted intertrochanteric fracture. Transverse proximal diaphyseal interlocking screw and left femoral neck screw. Improved, near anatomic alignment. There is again superior displacement of the lesser trochanter. No evidence of hardware failure. Moderate to severe patellofemoral joint space narrowing and osteophytosis. No knee joint effusion. Mild medial contour of the knee joint space narrowing. Mild calcific density lateral to the lateral compartment of the knee. Postoperative changes of lateral hip surgical skin staples, soft tissue swelling, and subcutaneous air. IMPRESSION: Interval ORIF of the prior comminuted displaced left intertrochanteric fracture. Electronically Signed   By: Yvonne Kendall M.D.   On: 08/11/2022 11:49   DG Pelvis Portable  Result Date: 08/11/2022 CLINICAL DATA:  Status post left IM nail  EXAM: PORTABLE PELVIS 1-2 VIEWS COMPARISON:  Earlier today FINDINGS: Postoperative changes from open reduction and internal fixation of the left intertrochanteric femur fracture. There is been interval placement of intramedullary rod and dynamic hip screw into the left femur and across the left femoral neck. Fracture fragments are in near anatomic alignment. IMPRESSION: Status post ORIF of left intertrochanteric femur fracture. Electronically Signed   By: Kerby Moors M.D.   On: 08/11/2022 11:38   DG FEMUR MIN 2 VIEWS LEFT  Result Date: 08/11/2022 CLINICAL DATA:  Intraoperative fluoroscopic images from LEFT femoral intramedullary nailing. EXAM: LEFT FEMUR 2 VIEWS; DG C-ARM 1-60 MIN-NO REPORT COMPARISON:  Previous imaging from August 10, 2022 FINDINGS: Comminuted fracture of the intratrochanteric LEFT femur is redemonstrated. Intraoperative spot fluoroscopic views a total of 7 images are provided displaying placement of an intramedullary rod and dynamic hip screw into the LEFT femur and across the LEFT femoral neck. Near anatomic alignment is noted. Serpiginous radiodense material is seen overlying the distal femur on image 5, favored to be external to the patient the could potentially represent markers for sponge material in the operative setting. No unexpected findings in the operative setting. Note that there appears to be interlocking screw in the mid to distal portion of the femoral nail obscured by shielding material placed over the patient. IMPRESSION: 1. Signs of ORIF of the LEFT intratrochanteric fracture with comminution, limited intraoperative fluoroscopic views. 2. Radiodense serpiginous structure over the distal femur on image 5 of 8 (potentially external to the patient though nonspecific and could represent markers for packing and or sponge material during the surgical procedure) and partially obscured intramedullary nail due to overlying shielding material. Correlate with operative history and  consider attention on subsequent imaging. These results will be called to the ordering clinician or representative by the Radiologist Assistant, and communication documented in the PACS or [employer]. Electronically Signed   By: Zetta Bills M.D.   On: 08/11/2022 11:29   DG C-Arm 1-60 Min-No Report  Result Date: 08/11/2022 Fluoroscopy was utilized by the requesting physician.  No radiographic interpretation.   CT Head  Wo Contrast  Result Date: 08/10/2022 CLINICAL DATA:  Fall EXAM: CT HEAD WITHOUT CONTRAST CT CERVICAL SPINE WITHOUT CONTRAST TECHNIQUE: Multidetector CT imaging of the head and cervical spine was performed following the standard protocol without intravenous contrast. Multiplanar CT image reconstructions of the cervical spine were also generated. RADIATION DOSE REDUCTION: This exam was performed according to the departmental dose-optimization program which includes automated exposure control, adjustment of the mA and/or kV according to patient size and/or use of iterative reconstruction technique. COMPARISON:  11/02/2021 CT head and cervical spine FINDINGS: CT HEAD FINDINGS Brain: No evidence of acute infarct, hemorrhage, mass, mass effect, or midline shift. No hydrocephalus or extra-axial fluid collection. Vascular: No hyperdense vessel. Skull: Left parietal scalp hematoma. Negative for fracture or focal lesion. Sinuses/Orbits: No acute finding. Other: The mastoid air cells are well aerated. CT CERVICAL SPINE FINDINGS Alignment: Straightening of the normal cervical lordosis. Skull base and vertebrae: Acute fracture of the spinous process of C6 (series 5, image 40 and series 3, image 50). No acute vertebral body fracture or pedicle fracture. No suspicious osseous lesion. Soft tissues and spinal canal: No prevertebral fluid or swelling. No visible canal hematoma. Disc levels: Multilevel disc height loss without high-grade spinal canal stenosis. Upper chest: Unchanged 2 mm nodule in the  right apex (series 4, image 89). No focal pulmonary opacity or pleural effusion. Other: None. IMPRESSION: 1. Acute fracture of the spinous process of C6. No vertebral body or pedicle fracture. 2. No acute intracranial process. Electronically Signed   By: Merilyn Baba M.D.   On: 08/10/2022 22:41   CT Cervical Spine Wo Contrast  Result Date: 08/10/2022 CLINICAL DATA:  Fall EXAM: CT HEAD WITHOUT CONTRAST CT CERVICAL SPINE WITHOUT CONTRAST TECHNIQUE: Multidetector CT imaging of the head and cervical spine was performed following the standard protocol without intravenous contrast. Multiplanar CT image reconstructions of the cervical spine were also generated. RADIATION DOSE REDUCTION: This exam was performed according to the departmental dose-optimization program which includes automated exposure control, adjustment of the mA and/or kV according to patient size and/or use of iterative reconstruction technique. COMPARISON:  11/02/2021 CT head and cervical spine FINDINGS: CT HEAD FINDINGS Brain: No evidence of acute infarct, hemorrhage, mass, mass effect, or midline shift. No hydrocephalus or extra-axial fluid collection. Vascular: No hyperdense vessel. Skull: Left parietal scalp hematoma. Negative for fracture or focal lesion. Sinuses/Orbits: No acute finding. Other: The mastoid air cells are well aerated. CT CERVICAL SPINE FINDINGS Alignment: Straightening of the normal cervical lordosis. Skull base and vertebrae: Acute fracture of the spinous process of C6 (series 5, image 40 and series 3, image 50). No acute vertebral body fracture or pedicle fracture. No suspicious osseous lesion. Soft tissues and spinal canal: No prevertebral fluid or swelling. No visible canal hematoma. Disc levels: Multilevel disc height loss without high-grade spinal canal stenosis. Upper chest: Unchanged 2 mm nodule in the right apex (series 4, image 89). No focal pulmonary opacity or pleural effusion. Other: None. IMPRESSION: 1. Acute  fracture of the spinous process of C6. No vertebral body or pedicle fracture. 2. No acute intracranial process. Electronically Signed   By: Merilyn Baba M.D.   On: 08/10/2022 22:41   DG Chest 1 View  Result Date: 08/10/2022 CLINICAL DATA:  Fall injury.  [id number]. EXAM: CHEST  1 VIEW COMPARISON:  Portable chest 01/17/2022. FINDINGS: The lungs are hyperaerated. Calcified granuloma is again noted in the left lower lung field. No focal pneumonia is seen. There is mild chronic elevation of the  left diaphragm. The sulci are sharp. Heart size and vasculature are normal. Stable mediastinum with aortic atherosclerosis and tortuosity. Osteopenia and lower thoracic dextroscoliosis. IMPRESSION: No evidence of acute chest process. Stable hyperinflated chest. Aortic atherosclerosis. Electronically Signed   By: Telford Nab M.D.   On: 08/10/2022 22:39   DG Hip Unilat With Pelvis 2-3 Views Left  Result Date: 08/10/2022 CLINICAL DATA:  Left hip pain after fall EXAM: DG HIP (WITH OR WITHOUT PELVIS) 2-3V LEFT COMPARISON:  Radiographs 12/10/2005 FINDINGS: Demineralization. Comminuted displaced intertrochanteric fracture of the left hip. There is slight apex lateral angulation and medial displacement of the distal fragment. The left femoral head is rotated superiorly but remains within the left acetabulum. No additional fractures. Degenerative arthritis right hip. Advanced degenerative changes lower lumbar spine. IMPRESSION: Comminuted displaced left intertrochanteric fracture. Electronically Signed   By: Placido Sou M.D.   On: 08/10/2022 22:32     Scheduled Meds:  diazepam  2.5 mg Oral TID   escitalopram  20 mg Oral q AM   feeding supplement (GLUCERNA SHAKE)  237 mL Oral TID BM   folic acid  1 mg Oral Daily   levETIRAcetam  500 mg Oral Daily   metoprolol succinate  25 mg Oral Daily   multivitamin with minerals  1 tablet Oral Daily   mupirocin ointment  1 Application Nasal BID   thiamine  100 mg Oral Daily    Continuous Infusions:  sodium chloride 50 mL/hr at 08/11/22 0913    ceFAZolin (ANCEF) IV 2 g (08/11/22 1759)   dextrose 5 % and 0.9% NaCl      LOS: 1 day   Roxan Hockey M.D on 08/11/2022 at 6:44 PM  Go to [email or web address] - for contact info  [facility] - Office  [phone]  If 7PM-7AM, please contact night-coverage [email or web address] 08/11/2022, 6:44 PM

## 2022-08-11 NOTE — Op Note (Signed)
Orthopaedic Surgery Operative Note (CSN: [id number])  [name]  [birth year] Date of Surgery: 08/11/2022   Diagnoses:  left intertrochanteric femur fracture; reverse obliquity pattern  Procedure: Cephalomedullary nail for left intertrochanteric femur fracture   Operative Finding Successful completion of the planned procedure.  Fracture was reduced, held in place with a ball spike pusher.  We successfully placed a 10 mm x 360 mm x 125 degree cephalomedullary nail, with a single distal interlock at the midpoint of the nail.   Post-Op Diagnosis: Same Surgeons:Primary: Mordecai Rasmussen, MD Assistants: Zoila Shutter Location: [facility] OR ROOM 4 Anesthesia: General with local anesthesia Antibiotics: Ancef 2 g Tourniquet time: N/A Estimated Blood Loss: 938 cc Complications: None Specimens: None Implants: Implant Name Type Inv. Item Serial No. Manufacturer Lot No. LRB No. Used Action  SCREW LOCK CORT 5.0X38 - SSTERILE ON SET Screw SCREW LOCK CORT 5.0X38 STERILE ON SET [employer]  Left 1 Implanted  SCREW LOCK LAG 10.5X85 GALILEO - [place] ON SET Screw SCREW LOCK LAG 10.5X85 GALILEO STERILE ON SET [place]  Left 1 Implanted  NAIL ES [place] L [id number] - [place] ON SET Nail NAIL ES [place] L [id number] STERILE ON SET [place]  Left 1 Implanted    Indications for Surgery:   [name] is a 81 y.o. female who sustained an unwitnessed  mechanical fall and sustained a Left intertrochanteric femur fracture.  I recommended operative fixation to restore stability and allow the patient to ambulate immediately postop.  Benefits and risks of operative and nonoperative management were discussed prior to surgery with patient's family and informed consent form was completed.  Specific risks including infection, need for additional surgery, persistent pain, bleeding, malunion, nonunion, blood clots in the lungs or the legs and more severe complications associated with anesthesia.  The patient's  daughter elected to proceed and surgical consent was finalized.    Procedure:   The patient was identified properly. Informed consent was obtained and the surgical site was marked. The patient was taken to the OR where general anesthesia was induced.   The patient was placed supine on a fracture table and appropriate reduction was obtained and visualized on fluoroscopy prior to the beginning of the procedure.  Timeout was performed before the beginning of the case.  Ancef 2 g dosing was confirmed prior to making incision.  The patient received TXA prior to the start of surgery.   We made an incision proximal to the greater trochanter and dissected down through the fascia.  We then carefully placed a guidepin, localizing under fluoroscopy.  Once satisfied with the starting point, the entry reamer was used to gain entry into the intramedullary canal.  A ball tip guidewire was then introduced and passed to an appropriate level at the physeal scar of the distal femur and measurement was obtained proximally using fluoroscopy.  We selected the appropriate length of nail, as noted above.  An entry reamer was used but the nail was unreamed.  At this point we placed our nail localizing under fluoroscopy, and confirmed that it was at the appropriate level.  We made an incision in the area of the cephalomedullary lag screw, and identified the large lateral spike.  This was reduced using a ball spike pusher.  Under fluoroscopy, we are satisfied with the reduction.  Next we used the outrigger device to pass a wire into the femoral neck, and then the cephalomedullary lag screw.  After the screw was tightened, the ball spike pusher was  removed, and we are satisfied with the overall reduction.  The screw was locked proximally to avoid over collapse.  We then turned our attention to the distal interlocking screw.  Once again, we used the outrigger device to place a single interlocking screw in the midshaft area.  The  outrigger device was removed and final fluoroscopic images were obtained.  The wounds were thoroughly irrigated closed in a multilayer fashion with 0 vicryl, 2-0 monocryl and staples.  Sterile dressings were placed.  The patient was awoken from general anesthesia and taken to the PACU in stable condition without complication.     Post-operative plan:  Weightbearing: The patient will be WBAT on the operative extremity.   DVT prophylaxis per primary team, no orthopedic contraindications.  Recommend 81 mg Aspirin BID, unless patient cannot tolerate or was previously on anticoagulation.  Prefer to start Ppx POD#1 Pain control with PRN pain medication preferring oral medicines.   Dressing can be reinforced as needed, will change on POD#2/3 if needed.  Patient does not need to remain hospitalized for dressing change Follow up plan: approximately 2 weeks postop for incision check and XR.  If the patient will be returning to a nursing facility, staples can be removed around this time and a follow up appointment can be scheduled for 6 weeks after surgery. XR at next visit:  please obtain AP pelvis, and 2 views of the Left hip/femur

## 2022-08-11 NOTE — Consult Note (Signed)
Consultation Note Date: 08/11/2022   Patient Name: [name]  DOB: 01/31/1941  MRN: [id number]  Age / Sex: 81 y.o., female  PCP: Asencion Noble, MD Referring Physician: Roxan Hockey, MD  Reason for Consultation: Establishing goals of care  HPI/Patient Profile: 81 y.o. female  with past medical history of HTN, seizures, alcohol dependence with alcoholic liver disease, anxiety/depression, recurrent kidney stones with complicated UTI 3785 with E. coli bacteremia, GERD, osteoporosis, recurrent falls, arthritis, carcinoid bronchial adenoma of right lung lives alone with caretaker 8am -7pm, admitted on 08/10/2022 with fall with left intertrochanteric femur fracture with cephalomedullary nailing 08/11/2022.   Clinical Assessment and Goals of Care: I have reviewed medical records including EPIC notes, labs and imaging, received report from RN, assessed the patient.  [name] is resting quietly in bed.  She appears acutely ill.  She is resting comfortably, postop, but wakes easily when I call her name.  She is able to make her basic needs known.  Her at home paid caregiver, Malachy Mood, is present at bedside.  Somewhat limited meeting today due to her being postop.   We meet at bedside to discuss diagnosis prognosis, GOC, EOL wishes, disposition and options.  I introduced Palliative Medicine as specialized medical care for people living with serious illness. It focuses on providing relief from the symptoms and stress of a serious illness. The goal is to improve quality of life for both the patient and the family.  We then focused on their current illness.  [name] asks "what happened?".  I share that she fell, which she acknowledges.  I shared that she broke her hip which has not been repaired.  The natural disease trajectory and expectations at EOL were discussed.  Advanced directives, concepts specific to code  status, were not discussed today as she is known DNR.  Discussed the importance of continued conversation with family and the medical providers regarding overall plan of care and treatment options, ensuring decisions are within the context of the patient's values and GOCs. Questions and concerns were addressed.  The patient was encouraged to call with questions or concerns.  PMT will continue to support holistically.  Conference with attending, bedside nursing staff, transition of care team related to patient condition, needs, goals of care, disposition.   HCPOA HCPOA -HCPOA/advance directives found in ACP tab of epic chart naming daughter, Marlyn Corporal, as healthcare surrogate.    SUMMARY OF RECOMMENDATIONS   At this point continue to treat the treatable but no CPR or intubation Short-term rehab anticipated Home with paid caregivers 8 AM to 7 PM   Code Status/Advance Care Planning: DNR  Symptom Management:  Per hospitalist/orthopedic, no additional needs at this time.  Palliative Prophylaxis:  Frequent Pain Assessment, Oral Care, Palliative Wound Care, and Turn Reposition  Additional Recommendations (Limitations, Scope, Preferences): At this point continue to treat the treatable but no CPR or intubation  Psycho-social/Spiritual:  Desire for further Chaplaincy support:no Additional Recommendations: Caregiving  Support/Resources  Prognosis:  Unable to determine, based on outcomes.  Discharge Planning: Anticipate need for short-term rehab then home.      Primary Diagnoses: Present on Admission:  Closed comminuted intertrochanteric fracture of left femur, initial encounter ([place])  Alcohol dependence ([place])  Fall  Dehydration  Hyponatremia  Depression   I have reviewed the medical record, interviewed the patient and family, and examined the patient. The following aspects are pertinent.  Past Medical History:  Diagnosis Date   Abdominal wall hernia, periumbilical,  fat-containing 05/19/2021   CT AP 05/18/21: Fat containing periumbilical anterior abdominal wall hernia.  Second fat and bowel containing infraumbilical anterior abdominal wall hernia. No CT evidence of bowel incarceration.   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   Depression    GERD (gastroesophageal reflux disease)    Kidney stones    Liver disease    Macrocytic anemia 05/19/2021   Mitral valve regurgitation, moderate 05/22/2021   Multiple closed fractures of ribs of left side 05/18/2021   Osteoporosis    Osteoporosis    Treated with denosumab   Physical deconditioning    Recurrent falls    Recurrent kidney stones    Calcium oxalate monohydrate on stone analysis 05/2020 at [facility] urology   Seizure [facility])    started on Keppra after SAH   Subarachnoid bleed ([place])    Subarachnoid hemorrhage ([place]) 12/19/2019   Social History   Socioeconomic History   Marital status: Widowed    Spouse name: Not on file   Number of children: 2   Years of education: college   Highest education level: Not on file  Occupational History   Not on file  Tobacco Use   Smoking status: Former    Types: Cigarettes    Quit date: 12/01/1996    Years since quitting: 25.7   Smokeless tobacco: Never  Vaping Use   Vaping Use: Never used  Substance and Sexual Activity   Alcohol use: Yes    Comment: 5 cartons of wine a week.   Drug use: No   Sexual activity:  Not on file  Other Topics Concern   Not on file  Social History Narrative   ** Merged History Encounter **       12/10/20 Lives alone but has caregivers come to her home. Right-handed. One cup caffeine per day.   Social Determinants of Health   Financial Resource Strain: Not on file  Food Insecurity: Not on file  Transportation Needs: Not on file  Physical Activity: Not on file  Stress: Not on file  Social Connections: Not on file   Family History  Problem Relation Age of Onset   Other Mother        "old age" - died at age 69   Lung cancer Father    Scheduled Meds:  diazepam  2.5 mg Oral TID   escitalopram  20 mg Oral q AM   feeding supplement ([place])  237 mL Oral TID BM   folic acid  1 mg Oral Daily   levETIRAcetam  500 mg Oral Daily   metoprolol succinate  25 mg Oral Daily   multivitamin with minerals  1 tablet Oral Daily   mupirocin ointment  1 Application Nasal BID   thiamine  100 mg Oral Daily   Continuous Infusions:  sodium chloride 50 mL/hr at 08/11/22 0913    ceFAZolin (ANCEF) IV     PRN Meds:.acetaminophen **OR** acetaminophen, HYDROcodone-acetaminophen, HYDROmorphone (DILAUDID) injection, morphine injection Medications Prior to Admission:  Prior to Admission medications   Medication Sig Start Date End Date Taking? Authorizing Provider  Biotin 2.5 MG TABS Take 1 tablet by mouth daily.     [provider]  Calcium Carb-Cholecalciferol (CALCIUM + D3 PO) Take 1 tablet by mouth daily with breakfast.    [provider]  cycloSPORINE (RESTASIS) 0.05 % ophthalmic emulsion Place 1 drop into both eyes 2 (two) times daily.     [provider]  escitalopram (LEXAPRO) 20 MG tablet Take 1 tablet (20 mg total) by mouth in the morning. 05/25/21   Lilland, Alana, DO  folic acid (FOLVITE) 1 MG tablet Take 1 tablet (1 mg total) by mouth daily. 05/25/21   Lilland, Alana, DO  KEPPRA 500 MG tablet Take 1 tablet by mouth daily. 12/12/21   [provider]  metoprolol succinate (TOPROL-XL) 25 MG 24 hr tablet Take 1 tablet (25 mg total) by mouth daily. 01/24/22   Orson Eva, MD  Multiple Vitamin (MULTIVITAMIN WITH MINERALS) TABS tablet Take 1 tablet by mouth daily. 11/08/21   Mariel Aloe, MD  potassium citrate (UROCIT-K) 10 MEQ (1080 MG) SR tablet Take 10 mEq by mouth daily.  03/22/19   [provider]  thiamine 100 MG tablet Take 100 mg by mouth daily.     [provider]  vancomycin (VANCOCIN) 125 MG capsule Take 1 capsule (125 mg total) by mouth 4 (four) times daily. 01/24/22   Orson Eva, MD   Allergies  Allergen Reactions   Clindamycin Rash   Clindamycin/Lincomycin Rash   Doxycycline Rash   Doxycycline Hyclate Rash   Review of Systems  Unable to perform ROS: Other    Physical Exam Vitals and nursing note reviewed.  Constitutional:      General: She is not in acute distress.    Appearance: She is ill-appearing.  Cardiovascular:     Rate and Rhythm: Normal rate.  Pulmonary:     Effort: Pulmonary effort is normal. No respiratory distress.  Skin:    General: Skin is warm and dry.  Neurological:     Comments: Postop today  Psychiatric:        Mood and Affect: Mood normal.        Behavior: Behavior normal.     Vital Signs: BP 107/74   Pulse (!) 115   Temp 97.6 F (36.4 C)   Resp 12   Ht 5' (1.524 m)   Wt 40 kg   SpO2 100%   BMI 17.22 kg/m  Pain Scale: Faces   Pain Score: Asleep   SpO2: SpO2: 100 % O2 Device:SpO2: 100 % O2 Flow Rate: .O2 Flow Rate (L/min): 2 L/min  IO: Intake/output summary:  Intake/Output Summary (Last 24 hours) at 08/11/2022 1417 Last data filed at 08/11/2022 1151 Gross per 24 hour  Intake 1612.36 ml  Output 575 ml  Net 1037.36 ml    LBM: Last BM Date : 08/10/22 Baseline Weight: Weight: 40  kg Most recent weight: Weight: 40 kg     Palliative Assessment/Data:   Flowsheet Rows    Flowsheet Row Most Recent Value  Intake Tab   Referral Department  Hospitalist  Unit at Time of Referral Med/Surg Unit  Palliative Care Primary Diagnosis Trauma  Date Notified 08/11/22  Palliative Care Type Return patient Palliative Care  Reason for referral Clarify Goals of Care  Date of Admission 08/10/22  Date first seen by Palliative Care 08/11/22  # of days Palliative referral response time 0 Day(s)  # of days IP prior to Palliative referral 1  Clinical Assessment   Palliative Performance Scale Score 20%  Pain Max last 24 hours Not able to report  Pain Min Last 24 hours Not able to report  Dyspnea Max Last 24 Hours Not able to report  Dyspnea Min Last 24 hours Not able to report  Psychosocial & Spiritual Assessment   Palliative Care Outcomes        Time In: 1230 Time Out: 1325 Time Total: 55 minutes  Greater than 50%  of this time was spent counseling and coordinating care related to the above assessment and plan.  Signed by: Drue Novel, NP   Please contact Palliative Medicine Team phone at [phone] for questions and concerns.  For individual provider: See Shea Evans

## 2022-08-11 NOTE — Anesthesia Preprocedure Evaluation (Addendum)
Anesthesia Evaluation  Patient identified by MRN, date of birth, ID band Patient awake    Reviewed: Allergy & Precautions, NPO status , Patient's Chart, lab work & pertinent test results  Airway Mallampati: II  TM Distance: >3 FB Neck ROM: Full    Dental  (+) Dental Advisory Given, Teeth Intact   Pulmonary neg pulmonary ROS, former smoker,    Pulmonary exam normal breath sounds clear to auscultation       Cardiovascular Exercise Tolerance: Poor hypertension, Pt. on medications + Valvular Problems/Murmurs MR  Rhythm:Regular Rate:Normal + Systolic murmurs    Neuro/Psych Seizures -, Well Controlled,  PSYCHIATRIC DISORDERS Anxiety Depression  Neuromuscular disease CVA    GI/Hepatic GERD  Medicated and Controlled,(+)     substance abuse  alcohol use,   Endo/Other  negative endocrine ROS  Renal/GU Renal disease (stones)  negative genitourinary   Musculoskeletal  (+) Arthritis , Osteoarthritis,    Abdominal   Peds negative pediatric ROS (+)  Hematology  (+) Blood dyscrasia, anemia ,   Anesthesia Other Findings Rib fx  Reproductive/Obstetrics negative OB ROS                            Anesthesia Physical Anesthesia Plan  ASA: 4  Anesthesia Plan: General   Post-op Pain Management: Dilaudid IV   Induction: Intravenous  PONV Risk Score and Plan: Ondansetron and Dexamethasone  Airway Management Planned: Oral ETT  Additional Equipment:   Intra-op Plan:   Post-operative Plan: Extubation in OR and Possible Post-op intubation/ventilation  Informed Consent: I have reviewed the patients History and Physical, chart, labs and discussed the procedure including the risks, benefits and alternatives for the proposed anesthesia with the patient or authorized representative who has indicated his/her understanding and acceptance.    Discussed DNR with patient, Discussed DNR with power of attorney,  Suspend DNR and Continue DNR.   Dental advisory given  Plan Discussed with: CRNA and Surgeon  Anesthesia Plan Comments: (In case of cardiac arrest, Family doesn't want prolonged resuscitation, agreed to get brief resuscitation  )      Anesthesia Quick Evaluation

## 2022-08-11 NOTE — Consult Note (Signed)
ORTHOPAEDIC CONSULTATION  REQUESTING PHYSICIAN: Roxan Hockey, MD  ASSESSMENT AND PLAN: 81 y.o. female with the following: Left Hip Intertrochanteric femur fracture, reverse obliquity  This patient requires inpatient admission to the hospitalist, to include preoperative clearance and perioperative medical management  - Weight Bearing Status/Activity: NWB Left lower extremity  - Additional recommended labs/tests: Preop Labs: CBC, BMP, PT/INR, Chest XR, and EKG; type and screen prior to surgery   -VTE Prophylaxis: Please hold prior to OR; to resume POD#1 at the discretion of the primary team  - Pain control: Recommend PO pain medications PRN; judicious use of narcotics  - Follow-up plan: F/u 10-14 days postop  -Procedures: Plan for OR once patient has been medically optimized.  She has been NPO since midnight.    Risks and benefits of surgery, including, but not limited to infection, bleeding, persistent pain, damage to surrounding structures, need for further surgery, malunion, nonunion, blood clots in the lungs or legs and more severe complications associated with anesthesia were discussed with the patient's daughter.  All questions have been answered and they have elected to proceed with surgery.  Surgical consent was finalized.   Plan for Left Hip Cephalomedullary nail     Chief Complaint: Left hip pain  HPI: [name] is a 81 y.o. female who presented to the ED for evaluation after sustaining a mechanical fall.  She lives at home, by herself, but has a caregiver who stats with her most of the time.  Her caregiver had left for the day, when the patient fell.  She does use a walker.  She lost her balance.  She does have some dementia, and most of this information has been gleaned from discussions with the ED provider, caregiver and the medicine provider.  She was found down, and the fall was not witnessed.  She was brought to the ED via EMS.  This morning, she is  comfortable when she is not moving.  She does have some pain with slight movement of the left leg.  Pain medications are helping.  She does answer some simple questions appropriately.`  Past Medical History:  Diagnosis Date   Abdominal wall hernia, periumbilical, fat-containing 05/19/2021   CT AP 05/18/21: Fat containing periumbilical anterior abdominal wall hernia.  Second fat and bowel containing infraumbilical anterior abdominal wall hernia. No CT evidence of bowel incarceration.   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   Depression    GERD (gastroesophageal reflux disease)    Kidney stones    Liver disease    Macrocytic anemia 05/19/2021   Mitral valve regurgitation, moderate 05/22/2021   Multiple closed fractures of ribs of left side 05/18/2021   Osteoporosis    Osteoporosis    Treated with denosumab   Physical deconditioning    Recurrent falls    Recurrent kidney stones    Calcium oxalate monohydrate on stone analysis 05/2020  at [facility] urology   Seizure [facility])    started on [place] after [facility]   Subarachnoid bleed ([place])    Subarachnoid hemorrhage ([place]) 12/19/2019   Past Surgical History:  Procedure Laterality Date   ABDOMINAL HYSTERECTOMY     vaginal   ABDOMINAL HYSTERECTOMY     Vaginal   APPENDECTOMY     CARPAL TUNNEL RELEASE  01/21/2012   Procedure: CARPAL TUNNEL RELEASE;  Surgeon: Wynonia Sours, MD;  Location: [place];  Service: Orthopedics;  Laterality: Right;   CARPAL TUNNEL RELEASE Right 01/02/2012   Dr Fredna Dow ([place])   COLON RESECTION  2004   perf bowel after colonoscopy   CYSTOSCOPY W/ RETROGRADES Bilateral 02/28/2018   Procedure: CYSTOSCOPY WITH BILATERAL RETROGRADE PYELOGRAM;BILATERAL URETERAL STENT PLACEMENT;  Surgeon: Cleon Gustin, MD;  Location: [facility];  Service: Urology;  Laterality: Bilateral;   CYSTOSCOPY W/ URETERAL STENT PLACEMENT  2010   lt-lazer stone   CYSTOSCOPY W/ URETERAL STENT PLACEMENT Bilateral 02/29/2020   Procedure: CYSTOSCOPY WITH BILATERAL RETROGRADE PYELOGRAM;BILATERAL URETERAL STENT PLACEMENT;  Surgeon: Cleon Gustin, MD;  Location: [facility];  Service: Urology;  Laterality: Bilateral;   CYSTOSCOPY WITH STENT PLACEMENT Bilateral 09/08/2014   Procedure: CYSTOSCOPY WITH STENT PLACEMENT;  Surgeon: Malka So, MD;  Location: [facility];  Service: Urology;  Laterality: Bilateral;   SMALL INTESTINE SURGERY  2004   Iliectomy   THORACOTOMY  2007   vatz-rt upper lobe   THORACOTOMY  12/01/2005   VATS Right Upper Lung Lobe - Dr Arlyce Dice ([facility])   TONSILLECTOMY     TONSILLECTOMY     Social History   Socioeconomic History   Marital status: Widowed    Spouse name: Not on file   Number of children: 2   Years of education: college   Highest education level: Not on file  Occupational History   Not on file  Tobacco Use   Smoking status: Former    Types: Cigarettes    Quit date: 12/01/1996    Years since quitting: 25.7   Smokeless tobacco: Never  Vaping Use   Vaping Use: Never used  Substance and Sexual Activity   Alcohol use: Yes    Comment: 5 cartons of wine a week.   Drug use: No   Sexual activity: Not on file  Other Topics Concern   Not on file  Social History Narrative   ** Merged History Encounter **       12/10/20 Lives alone but has caregivers come to her home. Right-handed. One cup caffeine per day.   Social Determinants of Health    Financial Resource Strain: Not on file  Food Insecurity: Not on file  Transportation Needs: Not on file  Physical Activity: Not on file  Stress: Not on file  Social Connections: Not on file   Family History  Problem Relation Age of Onset   Other Mother        "old age" - died at age 84   Lung cancer Father    Allergies  Allergen Reactions   Clindamycin Rash   Clindamycin/Lincomycin Rash   Doxycycline Rash   Doxycycline Hyclate Rash   Prior to Admission medications   Medication Sig Start Date End Date Taking? Authorizing Provider  Biotin 2.5 MG TABS Take 1 tablet by mouth daily.     [provider]  Calcium Carb-Cholecalciferol (CALCIUM + D3 PO) Take 1 tablet by mouth daily with breakfast.    [provider]  cycloSPORINE (RESTASIS) 0.05 %  ophthalmic emulsion Place 1 drop into both eyes 2 (two) times daily.     [provider]  escitalopram (LEXAPRO) 20 MG tablet Take 1 tablet (20 mg total) by mouth in the morning. 05/25/21   Lilland, Alana, DO  folic acid (FOLVITE) 1 MG tablet Take 1 tablet (1 mg total) by mouth daily. 05/25/21   Lilland, Alana, DO  KEPPRA 500 MG tablet Take 1 tablet by mouth daily. 12/12/21   [provider]  metoprolol succinate (TOPROL-XL) 25 MG 24 hr tablet Take 1 tablet (25 mg total) by mouth daily. 01/24/22   Orson Eva, MD  Multiple Vitamin (MULTIVITAMIN WITH MINERALS) TABS tablet Take 1 tablet by mouth daily. 11/08/21   Mariel Aloe, MD  potassium citrate (UROCIT-K) 10 MEQ (1080 MG) SR tablet Take 10 mEq by mouth daily.  03/22/19   [provider]  thiamine 100 MG tablet Take 100 mg by mouth daily.     [provider]  vancomycin (VANCOCIN) 125 MG capsule Take 1 capsule (125 mg total) by mouth 4 (four) times daily. 01/24/22   Orson Eva, MD   CT Head Wo Contrast  Result Date: 08/10/2022 CLINICAL DATA:  Fall EXAM: CT HEAD WITHOUT CONTRAST CT CERVICAL SPINE WITHOUT CONTRAST TECHNIQUE: Multidetector CT  imaging of the head and cervical spine was performed following the standard protocol without intravenous contrast. Multiplanar CT image reconstructions of the cervical spine were also generated. RADIATION DOSE REDUCTION: This exam was performed according to the departmental dose-optimization program which includes automated exposure control, adjustment of the mA and/or kV according to patient size and/or use of iterative reconstruction technique. COMPARISON:  11/02/2021 CT head and cervical spine FINDINGS: CT HEAD FINDINGS Brain: No evidence of acute infarct, hemorrhage, mass, mass effect, or midline shift. No hydrocephalus or extra-axial fluid collection. Vascular: No hyperdense vessel. Skull: Left parietal scalp hematoma. Negative for fracture or focal lesion. Sinuses/Orbits: No acute finding. Other: The mastoid air cells are well aerated. CT CERVICAL SPINE FINDINGS Alignment: Straightening of the normal cervical lordosis. Skull base and vertebrae: Acute fracture of the spinous process of C6 (series 5, image 40 and series 3, image 50). No acute vertebral body fracture or pedicle fracture. No suspicious osseous lesion. Soft tissues and spinal canal: No prevertebral fluid or swelling. No visible canal hematoma. Disc levels: Multilevel disc height loss without high-grade spinal canal stenosis. Upper chest: Unchanged 2 mm nodule in the right apex (series 4, image 89). No focal pulmonary opacity or pleural effusion. Other: None. IMPRESSION: 1. Acute fracture of the spinous process of C6. No vertebral body or pedicle fracture. 2. No acute intracranial process. Electronically Signed   By: Merilyn Baba M.D.   On: 08/10/2022 22:41   CT Cervical Spine Wo Contrast  Result Date: 08/10/2022 CLINICAL DATA:  Fall EXAM: CT HEAD WITHOUT CONTRAST CT CERVICAL SPINE WITHOUT CONTRAST TECHNIQUE: Multidetector CT imaging of the head and cervical spine was performed following the standard protocol without intravenous contrast.  Multiplanar CT image reconstructions of the cervical spine were also generated. RADIATION DOSE REDUCTION: This exam was performed according to the departmental dose-optimization program which includes automated exposure control, adjustment of the mA and/or kV according to patient size and/or use of iterative reconstruction technique. COMPARISON:  11/02/2021 CT head and cervical spine FINDINGS: CT HEAD FINDINGS Brain: No evidence of acute infarct, hemorrhage, mass, mass effect, or midline shift. No hydrocephalus or extra-axial fluid collection. Vascular: No hyperdense vessel. Skull: Left parietal scalp hematoma. Negative for fracture or focal  lesion. Sinuses/Orbits: No acute finding. Other: The mastoid air cells are well aerated. CT CERVICAL SPINE FINDINGS Alignment: Straightening of the normal cervical lordosis. Skull base and vertebrae: Acute fracture of the spinous process of C6 (series 5, image 40 and series 3, image 50). No acute vertebral body fracture or pedicle fracture. No suspicious osseous lesion. Soft tissues and spinal canal: No prevertebral fluid or swelling. No visible canal hematoma. Disc levels: Multilevel disc height loss without high-grade spinal canal stenosis. Upper chest: Unchanged 2 mm nodule in the right apex (series 4, image 89). No focal pulmonary opacity or pleural effusion. Other: None. IMPRESSION: 1. Acute fracture of the spinous process of C6. No vertebral body or pedicle fracture. 2. No acute intracranial process. Electronically Signed   By: Merilyn Baba M.D.   On: 08/10/2022 22:41   DG Chest 1 View  Result Date: 08/10/2022 CLINICAL DATA:  Fall injury.  [id number]. EXAM: CHEST  1 VIEW COMPARISON:  Portable chest 01/17/2022. FINDINGS: The lungs are hyperaerated. Calcified granuloma is again noted in the left lower lung field. No focal pneumonia is seen. There is mild chronic elevation of the left diaphragm. The sulci are sharp. Heart size and vasculature are normal. Stable mediastinum  with aortic atherosclerosis and tortuosity. Osteopenia and lower thoracic dextroscoliosis. IMPRESSION: No evidence of acute chest process. Stable hyperinflated chest. Aortic atherosclerosis. Electronically Signed   By: Telford Nab M.D.   On: 08/10/2022 22:39   DG Hip Unilat With Pelvis 2-3 Views Left  Result Date: 08/10/2022 CLINICAL DATA:  Left hip pain after fall EXAM: DG HIP (WITH OR WITHOUT PELVIS) 2-3V LEFT COMPARISON:  Radiographs 12/10/2005 FINDINGS: Demineralization. Comminuted displaced intertrochanteric fracture of the left hip. There is slight apex lateral angulation and medial displacement of the distal fragment. The left femoral head is rotated superiorly but remains within the left acetabulum. No additional fractures. Degenerative arthritis right hip. Advanced degenerative changes lower lumbar spine. IMPRESSION: Comminuted displaced left intertrochanteric fracture. Electronically Signed   By: Placido Sou M.D.   On: 08/10/2022 22:32     Family History Reviewed and non-contributory, no pertinent history of problems with bleeding or anesthesia    Review of Systems Unable to assess    OBJECTIVE  Vitals:Patient Vitals for the past 8 hrs:  BP Temp Temp src Pulse Resp SpO2  08/11/22 0608 137/86 97.7 F (36.5 C) -- (!) 117 18 95 %  08/11/22 0416 121/82 97.7 F (36.5 C) Oral (!) 119 16 92 %  08/11/22 0047 (!) 150/98 97.7 F (36.5 C) -- (!) 115 20 98 %   General: Alert, no acute distress.  Answers simple questions without issue.  Cardiovascular: Extremities are warm Respiratory: No cyanosis, no use of accessory musculature Skin: No lesions in the area of chief complaint  Neurologic: She responds to light touch distally Psychiatric: She answers basic questions appropriately, but is unable to consent for herself Lymphatic: No swelling obvious and reported other than the area involved in the exam below Extremities  LLE: Extremity held in a fixed position.  ROM deferred due  to known fracture.  She responds to light touch on the dorsum of her left foot.  2+ DP pulse.  Toes are WWP.  Active motion intact in the TA/EHL/GS. RLE: She responds to light touch in the dorsum of her right foot.  2+ DP pulse.  Toes are WWP.  Active motion intact in the TA/EHL/GS. Tolerates gentle ROM of the hip.  No pain with axial loading.  Test Results Imaging XR of the Left hip demonstrates a Intertrochanteric femur fracture, reverse obliquity pattern.  CT scan of the cervical spine demonstrates an acute spinous process at C6.  No additional injuries are noted.  Labs cbc Recent Labs    08/10/22 2244 08/11/22 0505  WBC 18.6* 14.0*  HGB 10.1* 8.2*  HCT 30.8* 25.5*  PLT 164 129*    Labs inflam No results for input(s): "CRP" in the last 72 hours.  Invalid input(s): "ESR"  Labs coag Recent Labs    08/10/22 2244  INR 1.1    Recent Labs    08/10/22 2244 08/11/22 0505  NA 129* 136  K 3.7 4.4  CL 96* 106  CO2 22 24  GLUCOSE 162* 154*  BUN 18 19  CREATININE 1.31* 1.26*  CALCIUM 8.5* 8.3*

## 2022-08-11 NOTE — Anesthesia Postprocedure Evaluation (Signed)
Anesthesia Post Note  Patient: [name]  Procedure(s) Performed: INTRAMEDULLARY (IM) NAIL INTERTROCHANTERIC (Left: Hip)  Patient location during evaluation: PACU Anesthesia Type: General Level of consciousness: awake and alert and oriented Pain management: pain level controlled Vital Signs Assessment: post-procedure vital signs reviewed and stable Respiratory status: spontaneous breathing, respiratory function stable and nonlabored ventilation Cardiovascular status: blood pressure returned to baseline and stable Postop Assessment: no apparent nausea or vomiting Anesthetic complications: yes   Encounter Notable Events  Notable Event Outcome Phase Comment  Difficult to intubate - expected  Intraprocedure Filed from anesthesia note documentation.     Last Vitals:  Vitals:   08/11/22 1130 08/11/22 1145  BP: (!) 129/58 107/74  Pulse: (!) 120 (!) 115  Resp: 12 12  Temp:    SpO2: 100% 100%    Last Pain:  Vitals:   08/11/22 1145  TempSrc:   PainSc: Asleep                 [name] C [name]

## 2022-08-11 NOTE — TOC Progression Note (Signed)
Transition of Care [facility]) - Progression Note    Patient Details  Name: [name] MRN: [id number] Date of Birth: 19-Mar-1941  [facility] CM/SW Contact  Boneta Lucks, RN Phone Number: 08/11/2022, 3:25 PM  Clinical Narrative:   Patient admitted with closed comminuted intertrochanteric fracture of left femur. Patient fell, lives at home alone with daily time sitters. Patients last alcohol drink was yesterday. TOC  started PASSR , it is pending needing FL2 uploaded. TOC waiting on PT eval.    Expected Discharge Plan: [place] to Discharge: Continued Medical Work up  Expected Discharge Plan and Services Expected Discharge Plan: [place]       Readmission Risk Interventions    11/06/2021   12:44 PM  Readmission Risk Prevention Plan  Transportation Screening Complete  Home Care Screening Complete  Medication Review (RN CM) Complete

## 2022-08-11 NOTE — Progress Notes (Signed)
Three rings, two watches, a hair tie and a silicon bracelet were removed from patient and left at bedside.  Pt's glasses were also left at bedside.

## 2022-08-11 NOTE — Transfer of Care (Signed)
Immediate Anesthesia Transfer of Care Note  Patient: [name]  Procedure(s) Performed: INTRAMEDULLARY (IM) NAIL INTERTROCHANTERIC (Left: Hip)  Patient Location: PACU  Anesthesia Type:General  Level of Consciousness: sedated, patient cooperative and responds to stimulation  Airway & Oxygen Therapy: Patient Spontanous Breathing and Patient connected to nasal cannula oxygen  Post-op Assessment: Report given to RN, Post -op Vital signs reviewed and stable and Patient moving all extremities  Post vital signs: Reviewed and stable  Last Vitals:  Vitals Value Taken Time  BP 187/89 08/11/22 1115  Temp    Pulse 118 08/11/22 1117  Resp 16 08/11/22 1117  SpO2 100 % 08/11/22 1117  Vitals shown include unvalidated device data.  Last Pain:  Vitals:   08/11/22 0905  TempSrc: Oral  PainSc:          Complications:  Encounter Notable Events  Notable Event Outcome Phase Comment  Difficult to intubate - expected  Intraprocedure Filed from anesthesia note documentation.

## 2022-08-12 DIAGNOSIS — Z7189 Other specified counseling: Secondary | ICD-10-CM | POA: Diagnosis not present

## 2022-08-12 DIAGNOSIS — Z515 Encounter for palliative care: Secondary | ICD-10-CM | POA: Diagnosis not present

## 2022-08-12 DIAGNOSIS — S72142A Displaced intertrochanteric fracture of left femur, initial encounter for closed fracture: Secondary | ICD-10-CM | POA: Diagnosis not present

## 2022-08-12 LAB — CBC
HCT: 23.7 % — ABNORMAL LOW (ref 36.0–46.0)
Hemoglobin: 7.7 g/dL — ABNORMAL LOW (ref 12.0–15.0)
MCH: 29.3 pg (ref 26.0–34.0)
MCHC: 32.5 g/dL (ref 30.0–36.0)
MCV: 90.1 fL (ref 80.0–100.0)
Platelets: 96 10*3/uL — ABNORMAL LOW (ref 150–400)
RBC: 2.63 MIL/uL — ABNORMAL LOW (ref 3.87–5.11)
RDW: 15.5 % (ref 11.5–15.5)
WBC: 13 10*3/uL — ABNORMAL HIGH (ref 4.0–10.5)
nRBC: 0 % (ref 0.0–0.2)

## 2022-08-12 LAB — BASIC METABOLIC PANEL
Anion gap: 4 — ABNORMAL LOW (ref 5–15)
BUN: 24 mg/dL — ABNORMAL HIGH (ref 8–23)
CO2: 24 mmol/L (ref 22–32)
Calcium: 7.7 mg/dL — ABNORMAL LOW (ref 8.9–10.3)
Chloride: 108 mmol/L (ref 98–111)
Creatinine, Ser: 1.42 mg/dL — ABNORMAL HIGH (ref 0.44–1.00)
GFR, Estimated: 37 mL/min — ABNORMAL LOW (ref >60)
Glucose, Bld: 128 mg/dL — ABNORMAL HIGH (ref 70–99)
Potassium: 4.4 mmol/L (ref 3.5–5.1)
Sodium: 136 mmol/L (ref 135–145)

## 2022-08-12 MED ORDER — DIAZEPAM 5 MG PO TABS
2.5000 mg | ORAL_TABLET | Freq: Every day | ORAL | Status: DC
  Administered 2022-08-13: 2.5 mg via ORAL
  Filled 2022-08-12: qty 1

## 2022-08-12 MED ORDER — DEXTROSE-NACL 5-0.9 % IV SOLN: INTRAVENOUS | Status: DC

## 2022-08-12 MED ORDER — ACETAMINOPHEN 500 MG PO TABS
1000.0000 mg | ORAL_TABLET | Freq: Three times a day (TID) | ORAL | Status: DC
  Administered 2022-08-12 – 2022-08-14 (×7): 1000 mg via ORAL
  Filled 2022-08-12 (×7): qty 2

## 2022-08-12 MED ORDER — HYDROMORPHONE HCL 1 MG/ML IJ SOLN
0.5000 mg | Freq: Two times a day (BID) | INTRAMUSCULAR | Status: DC | PRN
  Administered 2022-08-12: 0.5 mg via INTRAVENOUS
  Filled 2022-08-12 (×2): qty 0.5

## 2022-08-12 MED ORDER — ENSURE ENLIVE PO LIQD
237.0000 mL | Freq: Three times a day (TID) | ORAL | Status: DC
  Administered 2022-08-12 – 2022-08-13 (×5): 237 mL via ORAL

## 2022-08-12 MED ORDER — OXYCODONE HCL 5 MG PO TABS
5.0000 mg | ORAL_TABLET | ORAL | Status: DC | PRN
  Administered 2022-08-13 (×2): 5 mg via ORAL
  Filled 2022-08-12 (×4): qty 1

## 2022-08-12 NOTE — NC FL2 (Signed)
[place] LEVEL OF CARE SCREENING TOOL     IDENTIFICATION  Patient Name: [name] Birthdate: [birth year] Sex: female Admission Date (Current Location): 08/10/2022  [facility] and [place] Number:  [employer] and Address:  [facility] [address] [place]      Provider Number: [id number]  Attending Physician Name and Address:  Roxan Hockey, MD  Relative Name and Phone Number:  Marlyn Corporal  (Daughter)   [phone]    Current Level of Care: Hospital Recommended Level of Care: [place] Prior Approval Number:    Date Approved/Denied:   PASRR Number: Pending  Discharge Plan: SNF    Current Diagnoses: Patient Active Problem List   Diagnosis Date Noted   Scalp hematoma 08/11/2022   Fracture of cervical spinous process ([place]) 08/11/2022   Failure to thrive in adult 08/11/2022   Hypoalbuminemia due to protein-calorie malnutrition ([place]) 08/11/2022   Hyperglycemia 08/11/2022   Closed comminuted intertrochanteric fracture of left femur, initial encounter ([employer]) 08/10/2022   Sepsis due to undetermined organism ([place]) 01/23/2022   Normocytic anemia 01/21/2022   Protein-calorie malnutrition, severe 01/20/2022   Elevated TSH 01/19/2022   C. 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 05/19/2021   Cerebral atrophy ([place]) 05/19/2021   Generalized weakness  05/19/2021   Rhabdomyolysis 55/73/2202   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[place]) 05/19/2021   Atherosclerosis of aorta ([place]) 05/19/2021   Multiple skin tears 05/19/2021   Multiple Bruising sites 05/19/2021   Poor historian 05/19/2021   Acute renal injury ([place]) 05/19/2021   Transaminitis 05/19/2021   Bilirubinemia 05/19/2021   Hypoproteinemia ([place]) 05/19/2021   Elevated troponin 05/19/2021   Vitamin B12 deficiency 05/19/2021   Macrocytic anemia 05/19/2021   Leukocytosis 05/19/2021   TSH elevation 05/19/2021   Pyuria 05/19/2021   Hematuria 05/19/2021   Dehydration 05/19/2021   Cerebral atrophy ([place]) 05/19/2021   Degenerative disc disease, cervical 05/19/2021   Bilateral Periorbital ecchymosis 05/19/2021   Myocardial injury    Elevated CK    Fall 05/18/2021   Multiple closed fractures of ribs of left side 05/18/2021   Alteration consciousness 12/10/2020   Gait abnormality 12/10/2020   Sepsis ([place]) 02/29/2020   Acute lower UTI 02/29/2020   Unsteady gait 01/10/2020   Seizures ([place]) 01/10/2020   Osteoarthritis of left knee    Physical deconditioning 12/19/2019   Alcohol dependence ([place]) 12/19/2019   Thrombocytopenia ([place]) 12/19/2019   Subarachnoid hemorrhage ([place]) 12/19/2019   SAH (subarachnoid hemorrhage) ([place])    Transaminasemia    Subarachnoid bleed ([place]) 12/18/2019   Hypokalemia 12/18/2019   Depression    Hyperbilirubinemia    Hyponatremia    Macrocytic anemia    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[place])    Arthritis 05/10/2019   Diarrhea 10/13/2017   Recurrent kidney stones 10/31/2014   Left ureteral stone 09/08/2014   Right ureteral stone 09/08/2014   Infection of urinary tract 09/08/2014   Pain in joint, shoulder region 08/09/2012   Muscle weakness (  generalized) 08/09/2012   Impingement syndrome of left shoulder 08/09/2012    Orientation RESPIRATION BLADDER Height & Weight     Self  Normal Continent Weight: 40 kg Height:  5' (152.4 cm)  BEHAVIORAL SYMPTOMS/MOOD NEUROLOGICAL  BOWEL NUTRITION STATUS      Continent Diet (See DC summary)  AMBULATORY STATUS COMMUNICATION OF NEEDS Skin   Limited Assist Verbally Surgical wounds, Skin abrasions, Bruising, Other (Comment) (staples to back of head)                       Personal Care Assistance Level of Assistance  Bathing, Feeding, Dressing Bathing Assistance: Maximum assistance Feeding assistance: Limited assistance Dressing Assistance: Maximum assistance     Functional Limitations Info  Sight, Hearing, Speech Sight Info: Impaired Hearing Info: Adequate Speech Info: Adequate    SPECIAL CARE FACTORS FREQUENCY  PT (By licensed PT)     PT Frequency: 5 Times a week              Contractures Contractures Info: Not present    Additional Factors Info  Code Status, Allergies Code Status Info: DNR Allergies Info: Clindamycin, Doxycycline           Current Medications (08/12/2022):  This is the current hospital active medication list Current Facility-Administered Medications  Medication Dose Route Frequency Provider Last Rate Last Admin   acetaminophen (TYLENOL) tablet 650 mg  650 mg Oral Q6H PRN Mordecai Rasmussen, MD       Or   acetaminophen (TYLENOL) suppository 650 mg  650 mg Rectal Q6H PRN Mordecai Rasmussen, MD       dextrose 5 %-0.9 % sodium chloride infusion   Intravenous Continuous Denton Brick, Courage, MD 50 mL/hr at 08/12/22 0533 Infusion Verify at 08/12/22 0533   diazepam (VALIUM) tablet 2.5 mg  2.5 mg Oral TID Mordecai Rasmussen, MD   2.5 mg at 08/12/22 0955   enoxaparin (LOVENOX) injection 30 mg  30 mg Subcutaneous Q24H Emokpae, Courage, MD   30 mg at 08/12/22 0956   escitalopram (LEXAPRO) tablet 20 mg  20 mg Oral q AM Mordecai Rasmussen, MD   20 mg at 08/12/22 1610   feeding supplement (ENSURE ENLIVE / ENSURE PLUS) liquid 237 mL  237 mL Oral TID BM Emokpae, Courage, MD       folic acid (FOLVITE) tablet 1 mg  1 mg Oral Daily Mordecai Rasmussen, MD   1 mg at 08/12/22 0955   hydrALAZINE (APRESOLINE) injection  10 mg  10 mg Intravenous Q6H PRN Roxan Hockey, MD       HYDROcodone-acetaminophen (NORCO/VICODIN) 5-325 MG per tablet 1-2 tablet  1-2 tablet Oral Q6H PRN Mordecai Rasmussen, MD       HYDROmorphone (DILAUDID) injection 0.5 mg  0.5 mg Intravenous Q3H PRN Mordecai Rasmussen, MD   0.5 mg at 08/12/22 0914   levETIRAcetam (KEPPRA) tablet 500 mg  500 mg Oral Daily Larena Glassman A, MD   500 mg at 08/12/22 1028   LORazepam (ATIVAN) tablet 1-4 mg  1-4 mg Oral Q1H PRN Emokpae, Courage, MD       Or   LORazepam (ATIVAN) injection 1-4 mg  1-4 mg Intravenous Q1H PRN Emokpae, Courage, MD       metoprolol succinate (TOPROL-XL) 24 hr tablet 25 mg  25 mg Oral Daily Mordecai Rasmussen, MD   25 mg at 08/12/22 0955   morphine (PF) 2 MG/ML injection 0.5 mg  0.5 mg Intravenous Q2H PRN Amedeo Kinsman,  Jeannette How, MD   0.5 mg at 08/11/22 0150   multivitamin with minerals tablet 1 tablet  1 tablet Oral Daily Mordecai Rasmussen, MD   1 tablet at 08/12/22 0955   mupirocin ointment (BACTROBAN) 2 % 1 Application  1 Application Nasal BID Mordecai Rasmussen, MD   1 Application at 40/98/11 2126   thiamine (VITAMIN B1) tablet 100 mg  100 mg Oral Daily Mordecai Rasmussen, MD   100 mg at 08/12/22 9147     Discharge Medications: Please see discharge summary for a list of discharge medications.  Relevant Imaging Results:  Relevant Lab Results:   Additional Information SS# [id number]  Boneta Lucks, RN

## 2022-08-12 NOTE — Progress Notes (Signed)
PROGRESS NOTE     [name], is a 81 y.o. female, DOB - 03-27-1941, RJJ:[id number]  Admit date - 08/10/2022   Admitting Physician Angela Hoit, DO  Outpatient Primary MD for the patient is Angela Noble, MD  LOS - 2  Chief Complaint  Patient presents with   Fall        Brief Narrative:  81 y.o. female with medical history significant of hypertension, seizures, alcohol dependence, depression admitted on 08/10/2022 treated with left hip fracture mechanical fall at home in the setting of history of EtOH use   -Assessment and Plan: 1)Lt Hip Fx--left intertrochanteric femur fracture; reverse obliquity pattern On 08/11/22 underwent Cephalomedullary nail for left intertrochanteric femur fracture -Post-operative plan:  Weightbearing: The patient will be WBAT on the operative extremity.   DVT prophylaxis -start Lovenox on 08/12/2022 Pain control with PRN pain medication  Dressing can be reinforced as needed, will change on POD#2/3 if needed.   Follow up plan: approximately 2 weeks postop for incision check and XR.  If the patient will be returning to a nursing facility, staples can be removed around this time and a follow up appointment can be scheduled for 6 weeks after surgery. XR at next visit:  please obtain AP pelvis, and 2 views of the Left hip/femur -PT OT eval appreciated recommends SNF rehab  2)Acute fracture of the spinous process of C6. No vertebral body or pedicle fracture--- --Neurosurgery was consulted in relation to C6 spinous process noted on CT cervical spine and recommended neck collar -Outpatient follow-up with neurosurgery in 2 to 3 weeks advised  3)Closed head injury/scalp laceration--- head wound is hemostatic -CT head without acute findings -Supportive care  4)AKI----acute kidney injury - -baseline creatinine usually between 0.9 to 1.0  -Creatinine currently 1.42 partly due to dehydration in the setting of poor oral intake -continue IV fluids until oral  intake is more reliable -- renally adjust medications, avoid nephrotoxic agents / dehydration  / hypotension  5)Alcohol Abuse--- risk of DTs -Benzos per CIWA protocol -Multivitamins ordered  6)HTN--continue metoprolol .  IV hydralazine as needed better BP  7) acute hypoxic respiratory failure--- transient hypoxia appears to have resolved -Patient successfully weaned off oxygen on 08/11/2022  8) acute on chronic anemia--- due to acute blood loss in the setting of hip fracture/perioperative blood loss-- -Hgb is down to 7.7 baseline usually above 9 -Monitor closely and transfuse as clinically indicated  9)Leukocytosis----WBC 18.6 >> 13.0  ??  Reactive in the setting of fall/trauma and hip and C-spine fracture -No fevers, chest x-ray without acute infectious process -  Disposition/Need for in-Hospital Stay- patient unable to be discharged at this time due to hip fracture status post ORIF awaiting SNF placement for rehab  Status is: Inpatient   Disposition: The patient is from: Home              Anticipated d/c is to: SNF              Anticipated d/c date is: 2 days              Patient currently is not medically stable to d/c. Barriers: Not Clinically Stable-   Code Status :  -  Code Status: DNR   Family Communication:    Discussed with caregiver at bedside DVT Prophylaxis  :   - SCDs  enoxaparin (LOVENOX) injection 30 mg Start: 08/12/22 1000 SCDs Start: 08/11/22 0100 SCDs Start: 08/11/22 0043  Lab Results  Component Value Date   PLT 96 (  L) 08/12/2022   Inpatient Medications  Scheduled Meds:  acetaminophen  1,000 mg Oral TID   diazepam  2.5 mg Oral TID   enoxaparin (LOVENOX) injection  30 mg Subcutaneous Q24H   escitalopram  20 mg Oral q AM   feeding supplement  237 mL Oral TID BM   folic acid  1 mg Oral Daily   levETIRAcetam  500 mg Oral Daily   metoprolol succinate  25 mg Oral Daily   multivitamin with minerals  1 tablet Oral Daily   mupirocin ointment  1  Application Nasal BID   thiamine  100 mg Oral Daily   Continuous Infusions:  dextrose 5 % and 0.9% NaCl 50 mL/hr at 08/12/22 0533   PRN Meds:.hydrALAZINE, HYDROmorphone (DILAUDID) injection, LORazepam **OR** LORazepam, oxyCODONE   Anti-infectives (From admission, onward)    Start     Dose/Rate Route Frequency Ordered Stop   08/11/22 1800  ceFAZolin (ANCEF) IVPB 2g/100 mL premix        2 g 200 mL/hr over 30 Minutes Intravenous Every 12 hours 08/11/22 1314 08/12/22 0656   08/11/22 1600  ceFAZolin (ANCEF) IVPB 2g/100 mL premix  Status:  Discontinued        2 g 200 mL/hr over 30 Minutes Intravenous On call to O.R. 08/11/22 1225 08/11/22 1314   08/11/22 0600  ceFAZolin (ANCEF) IVPB 2g/100 mL premix        2 g 200 mL/hr over 30 Minutes Intravenous On call to O.R. 08/10/22 2304 08/11/22 0939        Subjective: [name] today has no fevers, no emesis,  No chest pain,   Daughter [name] is at bedside, questions answered -Occasional episodes of restlessness alternating with episodes of lethargy after medications administered -Oral intake is not great  Objective: Vitals:   08/11/22 1145 08/11/22 1624 08/11/22 1946 08/12/22 0449  BP: 107/74 121/66 117/79 (!) 115/59  Pulse: (!) 115 (!) 110 (!) 110 (!) 108  Resp: '12 17 20   '$ Temp:  98.4 F (36.9 C)  98.9 F (37.2 C)  TempSrc:  Oral  Oral  SpO2: 100% 96% 94% 93%  Weight:      Height:        Intake/Output Summary (Last 24 hours) at 08/12/2022 1230 Last data filed at 08/12/2022 0900 Gross per 24 hour  Intake 1050.8 ml  Output 350 ml  Net 700.8 ml   Filed Weights   08/10/22 2119  Weight: 40 kg   Physical Exam  Gen:- Awake Alert,  in no apparent distress  HEENT:- No sclera icterus, scalp wound is stapled and hemostatic Neck--Neck Collar in situ  Lungs-  CTAB , fair symmetrical air movement CV- S1, S2 normal, regular  Abd-  +ve B.Sounds, Abd Soft, No tenderness,    Extremity/Skin:- No  edema, pedal pulses present   Psych-affect is appropriate, oriented x3 Neuro-generalized weakness, no new focal deficits, no tremors MSK-left hip postop wound clean and intact  Data Reviewed: I have personally reviewed following labs and imaging studies  CBC: Recent Labs  Lab 08/10/22 2244 08/11/22 0505 08/12/22 0421  WBC 18.6* 14.0* 13.0*  NEUTROABS 15.4*  --   --   HGB 10.1* 8.2* 7.7*  HCT 30.8* 25.5* 23.7*  MCV 88.3 89.8 90.1  PLT 164 129* 96*   Basic Metabolic Panel: Recent Labs  Lab 08/10/22 2244 08/11/22 0505 08/12/22 0421  NA 129* 136 136  K 3.7 4.4 4.4  CL 96* 106 108  CO2 '22 24 24  '$ GLUCOSE 162*  154* 128*  BUN 18 19 24*  CREATININE 1.31* 1.26* 1.42*  CALCIUM 8.5* 8.3* 7.7*  MG  --  1.8  --   PHOS  --  4.9*  --    GFR: Estimated Creatinine Clearance: 20 mL/min (A) (by C-G formula based on SCr of 1.42 mg/dL (H)). Liver Function Tests: Recent Labs  Lab 08/10/22 2244 08/11/22 0505  AST 25 22  ALT 15 13  ALKPHOS 87 70  BILITOT 0.9 0.8  PROT 6.1* 5.3*  ALBUMIN 3.4* 3.0*   Recent Results (from the past 240 hour(s))  Surgical PCR screen     Status: Abnormal   Collection Time: 08/11/22  5:29 AM   Specimen: Nasal Mucosa; Nasal Swab  Result Value Ref Range Status   MRSA, PCR NEGATIVE NEGATIVE Final   Staphylococcus aureus POSITIVE (A) NEGATIVE Final    Comment: RESULT CALLED TO, READ BACK BY AND VERIFIED WITH: S. BLACKWELL @ [phone] BY STEPHTR 08/11/22 (NOTE) The Xpert SA Assay (FDA approved for NASAL specimens in patients 42 years of age and older), is one component of a comprehensive surveillance program. It is not intended to diagnose infection nor to guide or monitor treatment. Performed at [facility], [address], [place], [place] [address]     Radiology Studies: DG FEMUR MIN 2 VIEWS LEFT  Result Date: 08/11/2022 CLINICAL DATA:  Status post left intramedullary nail fixation of left hip EXAM: LEFT FEMUR 2 VIEWS COMPARISON:  Pelvis and left hip radiographs 08/10/2022  FINDINGS: Interval left femoral long intramedullary nail fixation of the previously seen comminuted intertrochanteric fracture. [place]verse proximal diaphyseal interlocking screw and left femoral neck screw. Improved, near anatomic alignment. There is again superior displacement of the lesser trochanter. No evidence of hardware failure. Moderate to severe patellofemoral joint space narrowing and osteophytosis. No knee joint effusion. Mild medial contour of the knee joint space narrowing. Mild calcific density lateral to the lateral compartment of the knee. Postoperative changes of lateral hip surgical skin staples, soft tissue swelling, and subcutaneous air. IMPRESSION: Interval ORIF of the prior comminuted displaced left intertrochanteric fracture. Electronically Signed   By: Yvonne Kendall M.D.   On: 08/11/2022 11:49   DG Pelvis Portable  Result Date: 08/11/2022 CLINICAL DATA:  Status post left IM nail EXAM: PORTABLE PELVIS 1-2 VIEWS COMPARISON:  Earlier today FINDINGS: Postoperative changes from open reduction and internal fixation of the left intertrochanteric femur fracture. There is been interval placement of intramedullary rod and dynamic hip screw into the left femur and across the left femoral neck. Fracture fragments are in near anatomic alignment. IMPRESSION: Status post ORIF of left intertrochanteric femur fracture. Electronically Signed   By: Kerby Moors M.D.   On: 08/11/2022 11:38   DG FEMUR MIN 2 VIEWS LEFT  Result Date: 08/11/2022 CLINICAL DATA:  Intraoperative fluoroscopic images from LEFT femoral intramedullary nailing. EXAM: LEFT FEMUR 2 VIEWS; DG C-ARM 1-60 MIN-NO REPORT COMPARISON:  Previous imaging from August 10, 2022 FINDINGS: Comminuted fracture of the intratrochanteric LEFT femur is redemonstrated. Intraoperative spot fluoroscopic views a total of 7 images are provided displaying placement of an intramedullary rod and dynamic hip screw into the LEFT femur and across the LEFT  femoral neck. Near anatomic alignment is noted. Serpiginous radiodense material is seen overlying the distal femur on image 5, favored to be external to the patient the could potentially represent markers for sponge material in the operative setting. No unexpected findings in the operative setting. Note that there appears to be interlocking screw in the  mid to distal portion of the femoral nail obscured by shielding material placed over the patient. IMPRESSION: 1. Signs of ORIF of the LEFT intratrochanteric fracture with comminution, limited intraoperative fluoroscopic views. 2. Radiodense serpiginous structure over the distal femur on image 5 of 8 (potentially external to the patient though nonspecific and could represent markers for packing and or sponge material during the surgical procedure) and partially obscured intramedullary nail due to overlying shielding material. Correlate with operative history and consider attention on subsequent imaging. These results will be called to the ordering clinician or representative by the Radiologist Assistant, and communication documented in the PACS or [employer]. Electronically Signed   By: Zetta Bills M.D.   On: 08/11/2022 11:29   DG C-Arm 1-60 Min-No Report  Result Date: 08/11/2022 Fluoroscopy was utilized by the requesting physician.  No radiographic interpretation.   CT Head Wo Contrast  Result Date: 08/10/2022 CLINICAL DATA:  Fall EXAM: CT HEAD WITHOUT CONTRAST CT CERVICAL SPINE WITHOUT CONTRAST TECHNIQUE: Multidetector CT imaging of the head and cervical spine was performed following the standard protocol without intravenous contrast. Multiplanar CT image reconstructions of the cervical spine were also generated. RADIATION DOSE REDUCTION: This exam was performed according to the departmental dose-optimization program which includes automated exposure control, adjustment of the mA and/or kV according to patient size and/or use of iterative  reconstruction technique. COMPARISON:  11/02/2021 CT head and cervical spine FINDINGS: CT HEAD FINDINGS Brain: No evidence of acute infarct, hemorrhage, mass, mass effect, or midline shift. No hydrocephalus or extra-axial fluid collection. Vascular: No hyperdense vessel. Skull: Left parietal scalp hematoma. Negative for fracture or focal lesion. Sinuses/Orbits: No acute finding. Other: The mastoid air cells are well aerated. CT CERVICAL SPINE FINDINGS Alignment: Straightening of the normal cervical lordosis. Skull base and vertebrae: Acute fracture of the spinous process of C6 (series 5, image 40 and series 3, image 50). No acute vertebral body fracture or pedicle fracture. No suspicious osseous lesion. Soft tissues and spinal canal: No prevertebral fluid or swelling. No visible canal hematoma. Disc levels: Multilevel disc height loss without high-grade spinal canal stenosis. Upper chest: Unchanged 2 mm nodule in the right apex (series 4, image 89). No focal pulmonary opacity or pleural effusion. Other: None. IMPRESSION: 1. Acute fracture of the spinous process of C6. No vertebral body or pedicle fracture. 2. No acute intracranial process. Electronically Signed   By: Merilyn Baba M.D.   On: 08/10/2022 22:41   CT Cervical Spine Wo Contrast  Result Date: 08/10/2022 CLINICAL DATA:  Fall EXAM: CT HEAD WITHOUT CONTRAST CT CERVICAL SPINE WITHOUT CONTRAST TECHNIQUE: Multidetector CT imaging of the head and cervical spine was performed following the standard protocol without intravenous contrast. Multiplanar CT image reconstructions of the cervical spine were also generated. RADIATION DOSE REDUCTION: This exam was performed according to the departmental dose-optimization program which includes automated exposure control, adjustment of the mA and/or kV according to patient size and/or use of iterative reconstruction technique. COMPARISON:  11/02/2021 CT head and cervical spine FINDINGS: CT HEAD FINDINGS Brain: No  evidence of acute infarct, hemorrhage, mass, mass effect, or midline shift. No hydrocephalus or extra-axial fluid collection. Vascular: No hyperdense vessel. Skull: Left parietal scalp hematoma. Negative for fracture or focal lesion. Sinuses/Orbits: No acute finding. Other: The mastoid air cells are well aerated. CT CERVICAL SPINE FINDINGS Alignment: Straightening of the normal cervical lordosis. Skull base and vertebrae: Acute fracture of the spinous process of C6 (series 5, image 40 and series 3, image 50).  No acute vertebral body fracture or pedicle fracture. No suspicious osseous lesion. Soft tissues and spinal canal: No prevertebral fluid or swelling. No visible canal hematoma. Disc levels: Multilevel disc height loss without high-grade spinal canal stenosis. Upper chest: Unchanged 2 mm nodule in the right apex (series 4, image 89). No focal pulmonary opacity or pleural effusion. Other: None. IMPRESSION: 1. Acute fracture of the spinous process of C6. No vertebral body or pedicle fracture. 2. No acute intracranial process. Electronically Signed   By: Merilyn Baba M.D.   On: 08/10/2022 22:41   DG Chest 1 View  Result Date: 08/10/2022 CLINICAL DATA:  Fall injury.  [id number]. EXAM: CHEST  1 VIEW COMPARISON:  Portable chest 01/17/2022. FINDINGS: The lungs are hyperaerated. Calcified granuloma is again noted in the left lower lung field. No focal pneumonia is seen. There is mild chronic elevation of the left diaphragm. The sulci are sharp. Heart size and vasculature are normal. Stable mediastinum with aortic atherosclerosis and tortuosity. Osteopenia and lower thoracic dextroscoliosis. IMPRESSION: No evidence of acute chest process. Stable hyperinflated chest. Aortic atherosclerosis. Electronically Signed   By: Telford Nab M.D.   On: 08/10/2022 22:39   DG Hip Unilat With Pelvis 2-3 Views Left  Result Date: 08/10/2022 CLINICAL DATA:  Left hip pain after fall EXAM: DG HIP (WITH OR WITHOUT PELVIS) 2-3V LEFT  COMPARISON:  Radiographs 12/10/2005 FINDINGS: Demineralization. Comminuted displaced intertrochanteric fracture of the left hip. There is slight apex lateral angulation and medial displacement of the distal fragment. The left femoral head is rotated superiorly but remains within the left acetabulum. No additional fractures. Degenerative arthritis right hip. Advanced degenerative changes lower lumbar spine. IMPRESSION: Comminuted displaced left intertrochanteric fracture. Electronically Signed   By: Placido Sou M.D.   On: 08/10/2022 22:32     Scheduled Meds:  acetaminophen  1,000 mg Oral TID   diazepam  2.5 mg Oral TID   enoxaparin (LOVENOX) injection  30 mg Subcutaneous Q24H   escitalopram  20 mg Oral q AM   feeding supplement  237 mL Oral TID BM   folic acid  1 mg Oral Daily   levETIRAcetam  500 mg Oral Daily   metoprolol succinate  25 mg Oral Daily   multivitamin with minerals  1 tablet Oral Daily   mupirocin ointment  1 Application Nasal BID   thiamine  100 mg Oral Daily   Continuous Infusions:  dextrose 5 % and 0.9% NaCl 50 mL/hr at 08/12/22 0533    LOS: 2 days   Roxan Hockey M.D on 08/12/2022 at 12:30 PM  Go to [email or web address] - for contact info  [facility] - Office  [phone]  If 7PM-7AM, please contact night-coverage [email or web address] 08/12/2022, 12:30 PM

## 2022-08-12 NOTE — Progress Notes (Signed)
Palliative:   [name] is resting quietly in bed.  She appears acutely/chronically ill and quite frail.  She is quite sleepy, but has received pain medicine about an hour earlier.  I believe that she can make her basic needs known.  Her daughters, [name] and [name] are present at bedside.   We talk in detail about [name] acute and chronic health concerns.  We talk about hip surgery/repair, recommendations for physical therapy at short-term rehab.  We talk about C6 spinous fracture, sharing images.  We talk about her care at home where she has paid caregivers.  Family is considering increasing caregivers to around-the-clock.   Conference with attending, bedside nursing staff, transition of care team related to patient condition, needs, goals of care, disposition.  Plan: At this point continue to treat the treatable but no CPR or intubation.  Time for outcomes.  Short-term rehab if patient agrees. [place] form/DNR completed and placed on chart.  60 minutes  Angela Axe, NP Palliative medicine team Team phone [phone] Greater than 50% of this time was spent counseling and coordinating care related to the above assessment and plan.

## 2022-08-12 NOTE — Progress Notes (Signed)
   ORTHOPAEDIC PROGRESS NOTE  s/p Procedure(s): Left hip cephalomedullary nail for a reverse obliquity intertrochanteric femur fracture   DOS: 08/11/2022  SUBJECTIVE: No issues over night.  Pain is controlled with medications.  She has not worked with PT yet.    OBJECTIVE: PE:  Alert.  Answers some questions appropriately  Small amount of drainage on lateral incisions, intact otherwise.  Some swelling and bruising over the lateral thigh Active motion intact in the TA/EHL Sensation intact over the dorsum of her foot  Vitals:   08/11/22 1946 08/12/22 0449  BP: 117/79 (!) 115/59  Pulse: (!) 110 (!) 108  Resp: 20   Temp:  98.9 F (37.2 C)  SpO2: 94% 93%       Latest Ref Rng & Units 08/12/2022    4:21 AM 08/11/2022    5:05 AM 08/10/2022   10:44 PM  CBC  WBC 4.0 - 10.5 K/uL 13.0  14.0  18.6   Hemoglobin 12.0 - 15.0 g/dL 7.7  8.2  10.1   Hematocrit 36.0 - 46.0 % 23.7  25.5  30.8   Platelets 150 - 400 K/uL 96  129  164      ASSESSMENT: [name] is a 81 y.o. female doing well postoperatively.  HB stable this morning.  If she is symptomatic, will consider another transfusion.   PLAN: Weightbearing: WBAT LLE Insicional and dressing care: Reinforce dressings as needed Orthopedic device(s): None VTE prophylaxis: Aspirin '81mg'$  BID  x 6 weeks; to being POD#1 Pain control: As needed.  Judicious use of narcotics.  Follow - up plan: approximately 2 weeks postop for incision check and XR.  If the patient will be returning to a nursing facility, staples can be removed around this time and a follow up appointment can be scheduled for 6 weeks after surgery.   Contact information:     Jaley Yan A. Amedeo Kinsman, MD [facility] [address] [place],  [place]  [address] Phone: [phone] Fax: [phone]

## 2022-08-12 NOTE — Evaluation (Signed)
Occupational Therapy Evaluation Patient Details Name: [name] MRN: [id number] DOB: 08/12/1941 Today's Date: 08/12/2022   History of Present Illness [name] is a 81 y.o. female with medical history significant of hypertension, seizures, alcohol dependence, depression who presents to the emergency department via EMS due to fall sustained at home.  Patient lives alone and has a caregiver who takes care of her from 8 AM to 7 PM, patient only complained of left hip pain and did not provide details regarding her fall.  Most of the history was obtained from ED physician and caregiver at bedside, per caregiver, patient sustained an unwitnessed fall after she left for home this evening.   Apparently, patient fell and sustained scalp laceration and hip pain with difficulty in being able to bear weight on the left lower extremity due to pain.  EMS was activated, and patient was taken to the ED for further evaluation and management.  Patient sustained bleeding from the scalp laceration, but she denies fever, chills, chest pain, shortness of breath, nausea or vomiting.  Patient's last alcohol consumption was today. Status post Cephalomedullary nail for left intertrochanteric femur fracture.   Clinical Impression   Pt agreeable to OT and PT co-evaluation but was limited in mobility due to pain. At baseline pt is a household ambulator needing assist from some ADL's. Today pt was in much pain which limited mobility. Max A was needed for supine to sit with HOB elevated. Same level of assist needed to return to bed. Pt demonstrates poor to fair sitting balance at EOB with complaint of much pain in L hip. Pt medicated prior to session but needed more prior to continuing session due to pain. Pt left in bed with family and nursing present. Pt will benefit from continued OT in the hospital and recommended venue below to increase strength, balance, and endurance for safe ADL's.          Recommendations  for follow up therapy are one component of a multi-disciplinary discharge planning process, led by the attending physician.  Recommendations may be updated based on patient status, additional functional criteria and insurance authorization.   Follow Up Recommendations  Skilled nursing-short term rehab (<3 hours/day)    Assistance Recommended at Discharge Intermittent Supervision/Assistance  Patient can return home with the following A lot of help with walking and/or transfers;A lot of help with bathing/dressing/bathroom;Assistance with cooking/housework;Assist for transportation;Help with stairs or ramp for entrance    Functional Status Assessment  Patient has had a recent decline in their functional status and demonstrates the ability to make significant improvements in function in a reasonable and predictable amount of time.  Equipment Recommendations  None recommended by OT    Recommendations for Other Services       Precautions / Restrictions Precautions Precautions: Fall Precaution Comments: Neck brace due to cerical fx Restrictions Weight Bearing Restrictions: Yes LLE Weight Bearing: Weight bearing as tolerated      Mobility Bed Mobility Overal bed mobility: Needs Assistance Bed Mobility: Supine to Sit, Sit to Supine     Supine to sit: Max assist, HOB elevated Sit to supine: Max assist   General bed mobility comments: Pt needed much extended time to complete and much physical assist. Pt complains of significant pain during any movement of L LE.    Transfers                          Balance Overall balance assessment: Needs assistance  Sitting-balance support: Bilateral upper extremity supported, Feet supported Sitting balance-Leahy Scale: Fair Sitting balance - Comments: seated at EOB                                   ADL either performed or assessed with clinical judgement   ADL Overall ADL's : Needs assistance/impaired     Grooming:  Minimal assistance;Sitting   Upper Body Bathing: Minimal assistance;Moderate assistance;Sitting   Lower Body Bathing: Total assistance;Bed level   Upper Body Dressing : Minimal assistance;Moderate assistance;Sitting   Lower Body Dressing: Total assistance;Bed level   Toilet Transfer: Maximal assistance Toilet Transfer Details (indicate cue type and reason): Unable to complete but likely max A at this time based on pt's difficulty with bed mobility alone. Toileting- Clothing Manipulation and Hygiene: Total assistance;Bed level         General ADL Comments: Limited by pain.     Vision Baseline Vision/History: 1 Wears glasses Ability to See in Adequate Light: 0 Adequate Patient Visual Report: No change from baseline Vision Assessment?: No apparent visual deficits                Pertinent Vitals/Pain Pain Assessment Pain Assessment: Faces Faces Pain Scale: Hurts whole lot Pain Location: L hip Pain Descriptors / Indicators: Moaning, Guarding Pain Intervention(s): Limited activity within patient's tolerance, Monitored during session, Repositioned, Premedicated before session     Hand Dominance Right   Extremity/Trunk Assessment Upper Extremity Assessment Upper Extremity Assessment: Generalized weakness   Lower Extremity Assessment Lower Extremity Assessment: Defer to PT evaluation       Communication Communication Communication: No difficulties   Cognition Arousal/Alertness: Awake/alert Behavior During Therapy: WFL for tasks assessed/performed Overall Cognitive Status: Within Functional Limits for tasks assessed                                                        Home Living Family/patient expects to be discharged to:: Private residence Living Arrangements: Alone Available Help at Discharge: Personal care attendant;Available PRN/intermittently (PRN at this time but daughter reported they would be able to increase that to 24/7 which pt  has had before.) Type of Home: House Home Access: Stairs to enter [employer] of Steps: 6 (Another entrance 1 step in home with 1 step up to kitchen as well. No rials.) Entrance Stairs-Rails: Right;Left Home Layout: Multi-level;Able to live on main level with bedroom/bathroom Alternate Level Stairs-Number of Steps: flight Alternate Level Stairs-Rails: Left Bathroom Shower/Tub: Walk-in Psychologist, prison and probation services: Standard     Home Equipment: Conservation officer, nature (2 wheels);Grab bars - toilet;Shower seat - built in   Additional Comments: Taken from chart review and pt/daughter report. Care attendant 8 AM to 7 PM right now but family reported ability to get 24/7 assist.      Prior Functioning/Environment Prior Level of Function : Needs assist       Physical Assist : ADLs (physical)     Mobility Comments: household ambulator with RW PRN but using less as of late. ADLs Comments: Care giver assists pt moderately with bathing and dressing. Pt is independent with tioleting, grooming, and feeding. Assited for IADL's.        OT Problem List: Decreased strength;Decreased activity tolerance;Decreased range of motion;Impaired balance (sitting and/or standing);Pain  OT Treatment/Interventions: Self-care/ADL training;Therapeutic exercise;DME and/or AE instruction;Therapeutic activities;Balance training;Patient/family education    OT Goals(Current goals can be found in the care plan section) Acute Rehab OT Goals Patient Stated Goal: return home OT Goal Formulation: With patient Time For Goal Achievement: 08/26/22 Potential to Achieve Goals: Fair  OT Frequency: Min 2X/week    Co-evaluation PT/OT/SLP Co-Evaluation/Treatment: Yes Reason for Co-Treatment: Complexity of the patient's impairments (multi-system involvement)   OT goals addressed during session: ADL's and self-care                       End of Session Equipment Utilized During Treatment: Rolling walker (2  wheels) Nurse Communication:  (Nurse present during bed mobility.)  Activity Tolerance: Patient limited by pain Patient left: in bed;with call bell/phone within reach;with family/visitor present;with nursing/sitter in room  OT Visit Diagnosis: Unsteadiness on feet (R26.81);Other abnormalities of gait and mobility (R26.89);History of falling (Z91.81);Muscle weakness (generalized) (M62.81);Pain Pain - Right/Left: Left Pain - part of body: Hip                Time: [phone] (Combined time from two encoutners due to needing to return to pt after pain medication was given.)-0913 OT Time Calculation (min): 40 min Charges:  OT General Charges $OT Visit: 1 Visit OT Evaluation $OT Eval Moderate Complexity: 1 Mod  Altair Appenzeller OT, MOT  Larey Seat 08/12/2022, 10:59 AM

## 2022-08-12 NOTE — Evaluation (Signed)
Physical Therapy Evaluation Patient Details Name: [name] MRN: [id number] DOB: 04/20/1941 Today's Date: 08/12/2022  History of Present Illness  [name] is a 81 y.o. female with medical history significant of hypertension, seizures, alcohol dependence, depression who presents to the emergency department via EMS due to fall sustained at home.  Patient lives alone and has a caregiver who takes care of her from 8 AM to 7 PM, patient only complained of left hip pain and did not provide details regarding her fall.  Most of the history was obtained from ED physician and caregiver at bedside, per caregiver, patient sustained an unwitnessed fall after she left for home this evening.   Apparently, patient fell and sustained scalp laceration and hip pain with difficulty in being able to bear weight on the left lower extremity due to pain.  EMS was activated, and patient was taken to the ED for further evaluation and management.  Patient sustained bleeding from the scalp laceration, but she denies fever, chills, chest pain, shortness of breath, nausea or vomiting.  Patient's last alcohol consumption was today. Status post Cephalomedullary nail for left intertrochanteric femur fracture.   Clinical Impression  Patient demonstrates slow labored movement for sitting up at bedside with c/o severe pain LLE with any movement.  Patient required much time and encouragement before attempting functional activity and received pain medication prior to therapy.  Patient tolerated sitting up at bedside with frequent posterior leaning and leaning to the right to keep pressure off left hip.  Patient unable to attempt sit to stands or transfer to chair due to c/o severe left hip pain and put back to bed with 2 person assist to reposition.  Patient will benefit from continued skilled physical therapy in hospital and recommended venue below to increase strength, balance, endurance for safe ADLs and gait.          Recommendations for follow up therapy are one component of a multi-disciplinary discharge planning process, led by the attending physician.  Recommendations may be updated based on patient status, additional functional criteria and insurance authorization.  Follow Up Recommendations Skilled nursing-short term rehab (<3 hours/day) Can patient physically be transported by private vehicle: No    Assistance Recommended at Discharge Intermittent Supervision/Assistance  Patient can return home with the following  A lot of help with bathing/dressing/bathroom;A lot of help with walking and/or transfers;Help with stairs or ramp for entrance;Assistance with cooking/housework    Equipment Recommendations None recommended by PT  Recommendations for Other Services       Functional Status Assessment Patient has had a recent decline in their functional status and demonstrates the ability to make significant improvements in function in a reasonable and predictable amount of time.     Precautions / Restrictions Precautions Precautions: Fall Precaution Comments: Neck brace due to cerical fx Restrictions Weight Bearing Restrictions: Yes LLE Weight Bearing: Weight bearing as tolerated      Mobility  Bed Mobility Overal bed mobility: Needs Assistance Bed Mobility: Supine to Sit, Sit to Supine     Supine to sit: Max assist, HOB elevated Sit to supine: Max assist   General bed mobility comments: requires much time and encouragement for sitting up at bedside with slow labored movement and c/o severe pain LLE    Transfers                        Ambulation/Gait  Stairs            Wheelchair Mobility    Modified Rankin (Stroke Patients Only)       Balance Overall balance assessment: Needs assistance Sitting-balance support: Feet supported, No upper extremity supported Sitting balance-Leahy Scale: Fair Sitting balance - Comments: seated at EOB                                      Pertinent Vitals/Pain Pain Assessment Pain Assessment: Faces Faces Pain Scale: Hurts whole lot Pain Location: L hip Pain Descriptors / Indicators: Moaning, Guarding, Grimacing, Sore Pain Intervention(s): Limited activity within patient's tolerance, Monitored during session, Premedicated before session, Repositioned    Home Living Family/patient expects to be discharged to:: Private residence Living Arrangements: Alone Available Help at Discharge: Personal care attendant;Available PRN/intermittently Type of Home: House Home Access: Stairs to enter Entrance Stairs-Rails: Right;Left Entrance Stairs-Number of Steps: 6 Alternate Level Stairs-Number of Steps: flight Home Layout: Multi-level;Able to live on main level with bedroom/bathroom Home Equipment: Rolling Walker (2 wheels);Grab bars - toilet;Shower seat - built in Additional Comments: Taken from chart review and pt/daughter report. Care attendant 8 AM to 7 PM right now but family reported ability to get 24/7 assist.    Prior Function Prior Level of Function : Needs assist       Physical Assist : Mobility (physical);ADLs (physical)     Mobility Comments: household ambulator with RW PRN but using less as of late. ADLs Comments: Care giver assists pt moderately with bathing and dressing. Pt is independent with tioleting, grooming, and feeding. Assited for IADL's.     Hand Dominance   Dominant Hand: Right    Extremity/Trunk Assessment   Upper Extremity Assessment Upper Extremity Assessment: Defer to OT evaluation    Lower Extremity Assessment Lower Extremity Assessment: Generalized weakness;LLE deficits/detail LLE Deficits / Details: grossly -3/5 LLE: Unable to fully assess due to pain LLE Sensation: WNL LLE Coordination: WNL    Cervical / Trunk Assessment Cervical / Trunk Assessment: Normal  Communication   Communication: No difficulties  Cognition  Arousal/Alertness: Awake/alert Behavior During Therapy: WFL for tasks assessed/performed Overall Cognitive Status: Within Functional Limits for tasks assessed                                          General Comments      Exercises     Assessment/Plan    PT Assessment Patient needs continued PT services  PT Problem List Decreased strength;Decreased activity tolerance;Decreased balance;Decreased mobility       PT Treatment Interventions DME instruction;Gait training;Stair training;Functional mobility training;Therapeutic activities;Therapeutic exercise;Patient/family education;Balance training    PT Goals (Current goals can be found in the Care Plan section)  Acute Rehab PT Goals Patient Stated Goal: return home with family/caregiver to assist PT Goal Formulation: With patient/family Time For Goal Achievement: 08/26/22 Potential to Achieve Goals: Good    Frequency Min 3X/week     Co-evaluation PT/OT/SLP Co-Evaluation/Treatment: Yes Reason for Co-Treatment: Complexity of the patient's impairments (multi-system involvement);To address functional/ADL transfers PT goals addressed during session: Mobility/safety with mobility;Balance;Proper use of DME OT goals addressed during session: ADL's and self-care       AM-PAC PT "6 Clicks" Mobility  Outcome Measure Help needed turning from your back to your side while in a flat bed without  using bedrails?: A Lot Help needed moving from lying on your back to sitting on the side of a flat bed without using bedrails?: A Lot Help needed moving to and from a bed to a chair (including a wheelchair)?: Total Help needed standing up from a chair using your arms (e.g., wheelchair or bedside chair)?: Total Help needed to walk in hospital room?: Total Help needed climbing 3-5 steps with a railing? : Total 6 Click Score: 8    End of Session   Activity Tolerance: Patient tolerated treatment well;Patient limited by  fatigue;Patient limited by pain Patient left: in bed;with call bell/phone within reach Nurse Communication: Mobility status PT Visit Diagnosis: Unsteadiness on feet (R26.81);Other abnormalities of gait and mobility (R26.89);Muscle weakness (generalized) (M62.81)    Time: 5597-4163 PT Time Calculation (min) (ACUTE ONLY): 20 min   Charges:   PT Evaluation $PT Eval Moderate Complexity: 1 Mod PT Treatments $Therapeutic Activity: 8-22 mins        12:12 PM, 08/12/22 Lonell Grandchild, MPT Physical Therapist with [facility] [phone] office [phone] mobile phone

## 2022-08-12 NOTE — Plan of Care (Signed)
  Problem: Acute Rehab OT Goals (only OT should resolve) Goal: Pt. Will Perform Grooming Flowsheets (Taken 08/12/2022 1102) Pt Will Perform Grooming:  sitting  with min guard assist Goal: Pt. Will Perform Upper Body Dressing Flowsheets (Taken 08/12/2022 1102) Pt Will Perform Upper Body Dressing:  with min guard assist  sitting Goal: Pt. Will Perform Lower Body Dressing Flowsheets (Taken 08/12/2022 1102) Pt Will Perform Lower Body Dressing:  with mod assist  sitting/lateral leans  with adaptive equipment Goal: Pt. Will Transfer To Toilet Flowsheets (Taken 08/12/2022 1102) Pt Will Transfer to Toilet:  with min assist  stand pivot transfer Goal: Pt. Will Perform Toileting-Clothing Manipulation Flowsheets (Taken 08/12/2022 1102) Pt Will Perform Toileting - Clothing Manipulation and hygiene:  with mod assist  sit to/from stand Goal: Pt/Caregiver Will Perform Home Exercise Program Flowsheets (Taken 08/12/2022 1102) Pt/caregiver will Perform Home Exercise Program:  Increased strength  Both right and left upper extremity  With Supervision  Delance Weide OT, MOT

## 2022-08-12 NOTE — TOC Initial Note (Addendum)
Transition of Care [facility]) - Initial/Assessment Note    Patient Details  Name: [name] MRN: [id number] Date of Birth: 1941/09/13  Transition of Care [facility]) CM/SW Contact:    Boneta Lucks, RN Phone Number: 08/12/2022, 10:02 AM  Clinical Narrative:       Patient wants to go home. Caregiver is also explaining to patient the need for SNF.  Substance abuse added to AVS. Palliative is going to have discussion with patient and caregiver.TOC waiting for PT eval and to complete PASSR if patient wishes to go to SNF.  Addendum :Daughter and Palliative continuing to have discussion with the patient.  The daughter ask TOC to send out for bed offers. She is not ready for discharge and hopefully she will be agreeable.  TOC uploaded documents to [place] must for PASSR review.  FL2 sent to [facility]. Per daughter she went there the last two times for rehab.    Expected Discharge Plan: Skilled Nursing Facility Barriers to Discharge: Continued Medical Work up   Patient Goals and CMS Choice   CMS Medicare.gov Compare Post Acute Care list provided to:: Patient Choice offered to / list presented to : Patient  Expected Discharge Plan and Services Expected Discharge Plan: [place]      Prior Living Arrangements/Services   Lives with:: Self            Current home services: [facility]    Activities of Daily Living Home Assistive Devices/Equipment: Environmental consultant (specify type) ADL Screening (condition at time of admission) Patient's cognitive ability adequate to safely complete daily activities?: No Is the patient deaf or have difficulty hearing?: No Does the patient have difficulty seeing, even when wearing glasses/contacts?: No Does the patient have difficulty concentrating, remembering, or making decisions?: Yes Patient able to express need for assistance with ADLs?: Yes Does the patient have difficulty dressing or bathing?: Yes Independently performs ADLs?: No Communication:  Independent Dressing (OT): Needs assistance Is this a change from baseline?: Change from baseline, expected to last <3days Grooming: Needs assistance Is this a change from baseline?: Change from baseline, expected to last <3 days Feeding: Independent Is this a change from baseline?: Pre-admission baseline Bathing: Needs assistance Is this a change from baseline?: Change from baseline, expected to last <3 days Toileting: Needs assistance Is this a change from baseline?: Change from baseline, expected to last <3 days In/Out Bed: Needs assistance Is this a change from baseline?: Change from baseline, expected to last <3 days Walks in Home: Needs assistance Is this a change from baseline?: Change from baseline, expected to last <3 days Does the patient have difficulty walking or climbing stairs?: Yes Weakness of Legs: Left Weakness of Arms/Hands: None  Permission Sought/Granted        Emotional Assessment     Affect (typically observed): Apprehensive Orientation: : Oriented to Self, Oriented to Situation Alcohol / Substance Use: Alcohol Use Psych Involvement: No (comment)  Admission diagnosis:  Fall, initial encounter [W19.XXXA] Closed comminuted intertrochanteric fracture of left femur, initial encounter ([place]) [S72.142A] Patient Active Problem List   Diagnosis Date Noted   Scalp hematoma 08/11/2022   Fracture of cervical spinous process ([place]) 08/11/2022   Failure to thrive in adult 08/11/2022   Hypoalbuminemia due to protein-calorie malnutrition ([place]) 08/11/2022   Hyperglycemia 08/11/2022   Closed comminuted intertrochanteric fracture of left femur, initial encounter ([place]) 08/10/2022   Sepsis due to undetermined organism ([place]) 01/23/2022   Normocytic anemia 01/21/2022   Protein-calorie malnutrition, severe 01/20/2022   Elevated TSH 01/19/2022  C. 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 05/19/2021   Cerebral atrophy ([place]) 05/19/2021   Generalized weakness 05/19/2021   Rhabdomyolysis 57/26/2035   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[place]) 05/19/2021   Atherosclerosis of aorta ([place]) 05/19/2021   Multiple skin tears 05/19/2021   Multiple Bruising sites 05/19/2021   Poor historian 05/19/2021   Acute renal injury ([place]) 05/19/2021   Transaminitis 05/19/2021   Bilirubinemia 05/19/2021   Hypoproteinemia ([place]) 05/19/2021   Elevated troponin 05/19/2021   Vitamin B12 deficiency 05/19/2021   Macrocytic anemia 05/19/2021   Leukocytosis 05/19/2021   TSH elevation 05/19/2021   Pyuria 05/19/2021   Hematuria 05/19/2021   Dehydration 05/19/2021   Cerebral atrophy ([place]) 05/19/2021   Degenerative disc disease, cervical 05/19/2021   Bilateral Periorbital ecchymosis 05/19/2021   Myocardial injury    Elevated CK    Fall 05/18/2021   Multiple closed fractures of ribs of left side 05/18/2021   Alteration consciousness 12/10/2020   Gait abnormality 12/10/2020   Sepsis ([place]) 02/29/2020   Acute lower UTI 02/29/2020   Unsteady gait 01/10/2020   Seizures ([place]) 01/10/2020   Osteoarthritis of left knee    Physical deconditioning 12/19/2019   Alcohol dependence ([place]) 12/19/2019   Thrombocytopenia ([place]) 12/19/2019   Subarachnoid hemorrhage ([place]) 12/19/2019   SAH (subarachnoid hemorrhage) ([place])    Transaminasemia    Subarachnoid bleed ([place]) 12/18/2019   Hypokalemia 12/18/2019   Depression    Hyperbilirubinemia    Hyponatremia     Macrocytic anemia    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[place])    Arthritis 05/10/2019   Diarrhea 10/13/2017   Recurrent kidney stones 10/31/2014   Left ureteral stone 09/08/2014   Right ureteral stone 09/08/2014   Infection of urinary tract 09/08/2014   Pain in joint, shoulder region 08/09/2012   Muscle weakness (generalized) 08/09/2012   Impingement syndrome of left shoulder 08/09/2012   PCP:  Asencion Noble, MD Pharmacy:   [facility] [place] [address] [place] [address] Phone: [phone] Fax: [phone]   Readmission Risk Interventions    08/12/2022   10:00 AM 11/06/2021   12:44 PM  Readmission Risk Prevention Plan  Transportation Screening Complete Complete  PCP or Specialist Appt within 3-5 Days Not Complete   Home Care Screening  Complete  Medication Review (RN CM)  Complete  HRI or Home Care Consult Complete   Social Work Consult for [place] Planning/Counseling Complete   Palliative Care Screening Not Applicable   Medication Review Press photographer) Complete

## 2022-08-12 NOTE — Plan of Care (Signed)
  Problem: Acute Rehab PT Goals(only PT should resolve) Goal: Pt Will Go Supine/Side To Sit Outcome: Progressing Flowsheets (Taken 08/12/2022 1214) Pt will go Supine/Side to Sit: with moderate assist Goal: Patient Will Transfer Sit To/From Stand Outcome: Progressing Flowsheets (Taken 08/12/2022 1214) Patient will transfer sit to/from stand: with moderate assist Goal: Pt Will Transfer Bed To Chair/Chair To Bed Outcome: Progressing Flowsheets (Taken 08/12/2022 1214) Pt will Transfer Bed to Chair/Chair to Bed: with mod assist Goal: Pt Will Perform Standing Balance Or Pre-Gait Outcome: Progressing Flowsheets (Taken 08/12/2022 1214) Pt will perform standing balance or pre-gait: with moderate assist   Problem: Acute Rehab PT Goals(only PT should resolve) Goal: Pt Will Ambulate Outcome: Progressing Flowsheets (Taken 08/12/2022 1215) Pt will Ambulate:  15 feet  with moderate assist  with rolling walker   12:15 PM, 08/12/22 Lonell Grandchild, MPT Physical Therapist with [facility] [phone] office [phone] mobile phone

## 2022-08-12 NOTE — Progress Notes (Signed)
Patient foley removed per order tolerated well

## 2022-08-12 NOTE — Progress Notes (Addendum)
Initial Nutrition Assessment  DOCUMENTATION CODES:   Not applicable  INTERVENTION:   Change supplement to Ensure Enlive po TID, each supplement provides 350 kcal and 20 grams of protein.  Continue MVI with minerals, folic acid, and thiamine daily.  NUTRITION DIAGNOSIS:   Increased nutrient needs related to hip fracture, post-op healing as evidenced by estimated needs.  GOAL:   Patient will meet greater than or equal to 90% of their needs  MONITOR:   PO intake, Supplement acceptance  REASON FOR ASSESSMENT:   Consult Hip fracture protocol  ASSESSMENT:   81 yo female admitted with left hip fracture S/P fall at home. PMH includes HTN, seizures, alcohol dependence, depression, GERD, osteoporosis, liver disease, SAH, recurrent kidney stones, chronic diarrhea.  9/11 - S/P CM nail L femur fracture.  RD working remotely. Unable to reach patient by phone call. Unable to complete NFPE at this time. Weight hx reviewed. Usual weight 38.5 kg February 2023. Current weight 40 kg. No significant weight changes. Suspect patient is malnourished, given BMI = 17 with hx of ETOH abuse. Unable to obtain enough information at this time for identification of malnutrition. Currently on a regular diet. Meal intakes not documented. Glucerna shake supplements ordered TID. Also receiving folic acid, MVI, and thiamine supplements. Given no hx of DM and not checking CBGs, will change supplement to Ensure Plus High Protein to provide more calories and protein.   Labs reviewed.  Medications reviewed and include folic acid, MVI, thiamine. IVF: D5 NS at 50 ml/h  Palliative care team is following. Plans for d/c to SNF for rehab.   NUTRITION - FOCUSED PHYSICAL EXAM:  Unable to complete  Diet Order:   Diet Order             Diet regular Room service appropriate? Yes; Fluid consistency: Thin  Diet effective now                   EDUCATION NEEDS:   Not appropriate for education at this  time  Skin:  Skin Assessment: Reviewed RN Assessment (head laceration)  Last BM:  9/10  Height:   Ht Readings from Last 1 Encounters:  08/10/22 5' (1.524 m)    Weight:   Wt Readings from Last 1 Encounters:  08/10/22 40 kg    Ideal Body Weight:  45.5 kg  BMI:  Body mass index is 17.22 kg/m.  Estimated Nutritional Needs:   Kcal:  1300-1500  Protein:  65-75 gm  Fluid:  >/= 1.5 L   Lucas Mallow RD, LDN, CNSC Please refer to Amion for contact information.

## 2022-08-13 DIAGNOSIS — R627 Adult failure to thrive: Secondary | ICD-10-CM | POA: Diagnosis not present

## 2022-08-13 DIAGNOSIS — Z515 Encounter for palliative care: Secondary | ICD-10-CM | POA: Diagnosis not present

## 2022-08-13 DIAGNOSIS — Z7189 Other specified counseling: Secondary | ICD-10-CM | POA: Diagnosis not present

## 2022-08-13 DIAGNOSIS — S72142A Displaced intertrochanteric fracture of left femur, initial encounter for closed fracture: Secondary | ICD-10-CM | POA: Diagnosis not present

## 2022-08-13 LAB — BASIC METABOLIC PANEL
Anion gap: 5 (ref 5–15)
BUN: 21 mg/dL (ref 8–23)
CO2: 21 mmol/L — ABNORMAL LOW (ref 22–32)
Calcium: 7.7 mg/dL — ABNORMAL LOW (ref 8.9–10.3)
Chloride: 112 mmol/L — ABNORMAL HIGH (ref 98–111)
Creatinine, Ser: 1.21 mg/dL — ABNORMAL HIGH (ref 0.44–1.00)
GFR, Estimated: 45 mL/min — ABNORMAL LOW (ref >60)
Glucose, Bld: 108 mg/dL — ABNORMAL HIGH (ref 70–99)
Potassium: 3.5 mmol/L (ref 3.5–5.1)
Sodium: 138 mmol/L (ref 135–145)

## 2022-08-13 LAB — HEMOGLOBIN AND HEMATOCRIT, BLOOD
HCT: 32.4 % — ABNORMAL LOW (ref 36.0–46.0)
Hemoglobin: 10.5 g/dL — ABNORMAL LOW (ref 12.0–15.0)

## 2022-08-13 LAB — CBC
HCT: 20 % — ABNORMAL LOW (ref 36.0–46.0)
Hemoglobin: 6.4 g/dL — CL (ref 12.0–15.0)
MCH: 29.8 pg (ref 26.0–34.0)
MCHC: 32 g/dL (ref 30.0–36.0)
MCV: 93 fL (ref 80.0–100.0)
Platelets: 91 10*3/uL — ABNORMAL LOW (ref 150–400)
RBC: 2.15 MIL/uL — ABNORMAL LOW (ref 3.87–5.11)
RDW: 15.7 % — ABNORMAL HIGH (ref 11.5–15.5)
WBC: 8.2 10*3/uL (ref 4.0–10.5)
nRBC: 0 % (ref 0.0–0.2)

## 2022-08-13 LAB — PREPARE RBC (CROSSMATCH)

## 2022-08-13 MED ORDER — SODIUM CHLORIDE 0.9% IV SOLUTION: Freq: Once | INTRAVENOUS | Status: AC

## 2022-08-13 NOTE — Progress Notes (Signed)
DOB: [birth year] Date:08/13/2022   Must ID: [id number]   To Whom it May Concern:  Please be advised that the above named patient will require a short-term nursing home stay- anticipated 30 days or less rehabilitation and strengthening. The plan is for return home.

## 2022-08-13 NOTE — Progress Notes (Signed)
PROGRESS NOTE     [name], is a 81 y.o. female, DOB - 1941/10/22, YSA:[id number]  Admit date - 08/10/2022   Admitting Physician Bernadette Hoit, DO  Outpatient Primary MD for the patient is Asencion Noble, MD  LOS - 3  Chief Complaint  Patient presents with   Fall      Subjective: [name] was seen and examined this morning, complaining of severe generalized weaknesses, Denies any chest pain or shortness of breath  Brief Narrative:  82 y.o. female with medical history significant of hypertension, seizures, alcohol dependence, depression admitted on 08/10/2022 treated with left hip fracture mechanical fall at home in the setting of history of EtOH use   Assessment and Plan:  - Lt Hip Fx--left intertrochanteric femur fracture; reverse obliquity pattern On 08/11/22 underwent Cephalomedullary nail for left intertrochanteric femur fracture -Post-operative plan:  Weightbearing: The patient will be WBAT on the operative extremity.   DVT prophylaxis -start Lovenox on 08/12/2022 Pain control with PRN pain medication  Dressing can be reinforced as needed, will change on POD#2/3 if needed.   Follow up plan: approximately 2 weeks postop for incision check and XR.  If the patient will be returning to a nursing facility, staples can be removed around this time and a follow up appointment can be scheduled for 6 weeks after surgery. XR at next visit:  please obtain AP pelvis, and 2 views of the Left hip/femur -PT OT eval appreciated recommends SNF rehab  Acute fracture of the spinous process of C6.  -No vertebral body or pedicle fracture--- --Neurosurgery was consulted in relation to C6 spinous process noted on CT cervical spine and recommended neck collar -Outpatient follow-up with neurosurgery in 2 to 3 weeks advised  Closed head injury/scalp laceration--- head wound is hemostatic -CT head without acute findings -Supportive care  Acute on chronic anemia     Latest Ref Rng &  Units 08/13/2022    4:25 AM 08/12/2022    4:21 AM 08/11/2022    5:05 AM  CBC  WBC 4.0 - 10.5 K/uL 8.2  13.0  14.0   Hemoglobin 12.0 - 15.0 g/dL 6.4  7.7  8.2   Hematocrit 36.0 - 46.0 % 20.0  23.7  25.5   Platelets 150 - 400 K/uL 91  96  129    -Due to generalized weaknesses, transfusing 2 units of PRBC Pros and cons of blood transfusion was discussed with the patient and her daughter at bedside--agreed to proceed  - due to acute blood loss in the setting of hip fracture/perioperative blood loss-- -Hgb is down to 7.7 baseline usually above 9 >>> 6.4 transfusing 2 unit PRBC -Monitor closely and transfuse as clinically indicated  AKI----acute kidney injury - -baseline creatinine usually between 0.9 to 1.0  -Creatinine currently 1.42 partly due to dehydration in the setting of poor oral intake -continue IV fluids until oral intake is more reliable -- renally adjust medications, avoid nephrotoxic agents / dehydration  / hypotension   Alcohol Abuse--- risk of DTs -Benzos per CIWA protocol -Multivitamins ordered  HTN--continue metoprolol .  IV hydralazine as needed better BP  Acute hypoxic respiratory failure-- - stable satting 94% on room air - transient hypoxia appears to have resolved -Patient successfully weaned off oxygen on 08/11/2022  Aacute on chronic anemia--- Leukocytosis----WBC 18.6 >> 13.0  >>. 8.2   Reactive in the setting of fall/trauma and hip and C-spine fracture -No fevers, chest x-ray without acute infectious process  Disposition/Need for in-Hospital Stay- patient unable to  be discharged at this time due to hip fracture status post ORIF awaiting SNF placement for rehab, needing blood transfusion today  Status is: Inpatient   Disposition: The patient is from: Home              Anticipated d/c is to: SNF              Anticipated d/c date is: In 1-2 days              Patient currently is not medically stable to d/c. Barriers: Not Clinically Stable-   Code Status  :  -  Code Status: DNR   Family Communication:    Discussed with caregiver at bedside DVT Prophylaxis  :   - SCDs  enoxaparin (LOVENOX) injection 30 mg Start: 08/12/22 1000 SCDs Start: 08/11/22 0100 SCDs Start: 08/11/22 0043  Lab Results  Component Value Date   PLT 91 (L) 08/13/2022   Inpatient Medications  Scheduled Meds:  acetaminophen  1,000 mg Oral TID   diazepam  2.5 mg Oral QHS   enoxaparin (LOVENOX) injection  30 mg Subcutaneous Q24H   escitalopram  20 mg Oral q AM   feeding supplement  237 mL Oral TID BM   folic acid  1 mg Oral Daily   levETIRAcetam  500 mg Oral Daily   metoprolol succinate  25 mg Oral Daily   multivitamin with minerals  1 tablet Oral Daily   mupirocin ointment  1 Application Nasal BID   thiamine  100 mg Oral Daily   Continuous Infusions:   PRN Meds:.hydrALAZINE, HYDROmorphone (DILAUDID) injection, LORazepam **OR** LORazepam, oxyCODONE   Anti-infectives (From admission, onward)    Start     Dose/Rate Route Frequency Ordered Stop   08/11/22 1800  ceFAZolin (ANCEF) IVPB 2g/100 mL premix        2 g 200 mL/hr over 30 Minutes Intravenous Every 12 hours 08/11/22 1314 08/12/22 0656   08/11/22 1600  ceFAZolin (ANCEF) IVPB 2g/100 mL premix  Status:  Discontinued        2 g 200 mL/hr over 30 Minutes Intravenous On call to O.R. 08/11/22 1225 08/11/22 1314   08/11/22 0600  ceFAZolin (ANCEF) IVPB 2g/100 mL premix        2 g 200 mL/hr over 30 Minutes Intravenous On call to O.R. 08/10/22 2304 08/11/22 0939         Objective: Vitals:   08/13/22 0911 08/13/22 1135 08/13/22 1140 08/13/22 1204  BP: 123/62 (!) 100/57 (!) 100/57 (!) 104/54  Pulse: (!) 107 84 84 86  Resp: '20 20 20 18  '$ Temp: 98.1 F (36.7 C) 98 F (36.7 C) 98 F (36.7 C) 98.1 F (36.7 C)  TempSrc: Oral     SpO2: 94%  94% 94%  Weight:      Height:        Intake/Output Summary (Last 24 hours) at 08/13/2022 1259 Last data filed at 08/13/2022 1135 Gross per 24 hour  Intake 2042.8 ml   Output 200 ml  Net 1842.8 ml   Filed Weights   08/10/22 2119  Weight: 40 kg   Physical Exam The patient was seen and examined, no significant changes Gen:-Awake alert oriented x3 no acute distress HEENT:-Within normal limits with exception of - scalp wound is stapled and hemostatic Neck--Neck Collar in situ  Lungs-clear to auscultation bilaterally negative wheezes or crackles CV-regular S1-S2 no Gallops  ABD -   positive bowel sounds soft nontender nondistended Extremity/Skin:- No  edema, pedal pulses present, global  generalized ( Neuro-generalized weakness, no new focal deficits, no tremors MSK-left hip postop wound clean and intact  Data Reviewed: I have personally reviewed following labs and imaging studies  CBC: Recent Labs  Lab 08/10/22 2244 08/11/22 0505 08/12/22 0421 08/13/22 0425  WBC 18.6* 14.0* 13.0* 8.2  NEUTROABS 15.4*  --   --   --   HGB 10.1* 8.2* 7.7* 6.4*  HCT 30.8* 25.5* 23.7* 20.0*  MCV 88.3 89.8 90.1 93.0  PLT 164 129* 96* 91*   Basic Metabolic Panel: Recent Labs  Lab 08/10/22 2244 08/11/22 0505 08/12/22 0421 08/13/22 0425  NA 129* 136 136 138  K 3.7 4.4 4.4 3.5  CL 96* 106 108 112*  CO2 '22 24 24 '$ 21*  GLUCOSE 162* 154* 128* 108*  BUN 18 19 24* 21  CREATININE 1.31* 1.26* 1.42* 1.21*  CALCIUM 8.5* 8.3* 7.7* 7.7*  MG  --  1.8  --   --   PHOS  --  4.9*  --   --    GFR: Estimated Creatinine Clearance: 23.4 mL/min (A) (by C-G formula based on SCr of 1.21 mg/dL (H)). Liver Function Tests: Recent Labs  Lab 08/10/22 2244 08/11/22 0505  AST 25 22  ALT 15 13  ALKPHOS 87 70  BILITOT 0.9 0.8  PROT 6.1* 5.3*  ALBUMIN 3.4* 3.0*   Recent Results (from the past 240 hour(s))  Surgical PCR screen     Status: Abnormal   Collection Time: 08/11/22  5:29 AM   Specimen: Nasal Mucosa; Nasal Swab  Result Value Ref Range Status   MRSA, PCR NEGATIVE NEGATIVE Final   Staphylococcus aureus POSITIVE (A) NEGATIVE Final    Comment: RESULT CALLED TO, READ  BACK BY AND VERIFIED WITH: S. BLACKWELL @ [phone] BY STEPHTR 08/11/22 (NOTE) The Xpert SA Assay (FDA approved for NASAL specimens in patients 80 years of age and older), is one component of a comprehensive surveillance program. It is not intended to diagnose infection nor to guide or monitor treatment. Performed at [facility], [address], [place], [place] [address]     Radiology Studies: No results found.   Scheduled Meds:  acetaminophen  1,000 mg Oral TID   diazepam  2.5 mg Oral QHS   enoxaparin (LOVENOX) injection  30 mg Subcutaneous Q24H   escitalopram  20 mg Oral q AM   feeding supplement  237 mL Oral TID BM   folic acid  1 mg Oral Daily   levETIRAcetam  500 mg Oral Daily   metoprolol succinate  25 mg Oral Daily   multivitamin with minerals  1 tablet Oral Daily   mupirocin ointment  1 Application Nasal BID   thiamine  100 mg Oral Daily   Continuous Infusions:    LOS: 3 days   Deatra James M.D on 08/13/2022 at 12:59 PM  Go to [email or web address] - for contact info  [facility] - Office  [phone]  If 7PM-7AM, please contact night-coverage [email or web address] 08/13/2022, 12:59 PM

## 2022-08-13 NOTE — TOC Progression Note (Signed)
Transition of Care [facility]) - Progression Note    Patient Details  Name: [name] MRN: [id number] Date of Birth: August 22, 1941  Transition of Care [facility]) CM/SW Contact  Salome Arnt, [place] Phone Number: 08/13/2022, 2:42 PM  Clinical Narrative:  LCSW presented bed offers and pt's daughters choose [facility]. Admissions notified. Possible d/c tomorrow. TOC will continue to follow.      Expected Discharge Plan: Skilled Nursing Facility Barriers to Discharge: Continued Medical Work up  Expected Discharge Plan and Services Expected Discharge Plan: [place]                                               Social Determinants of Health (SDOH) Interventions    Readmission Risk Interventions    08/12/2022   10:00 AM 11/06/2021   12:44 PM  Readmission Risk Prevention Plan  Transportation Screening Complete Complete  PCP or Specialist Appt within 3-5 Days Not Complete   Home Care Screening  Complete  Medication Review (RN CM)  Complete  HRI or Home Care Consult Complete   Social Work Consult for [place] Planning/Counseling Complete   Palliative Care Screening Not Applicable   Medication Review Press photographer) Complete

## 2022-08-13 NOTE — Progress Notes (Signed)
Physical Therapy Treatment Patient Details Name: [name] MRN: [id number] DOB: May 31, 1941 Today's Date: 08/13/2022   History of Present Illness [name] is a 81 y.o. female with medical history significant of hypertension, seizures, alcohol dependence, depression who presents to the emergency department via EMS due to fall sustained at home.  Patient lives alone and has a caregiver who takes care of her from 8 AM to 7 PM, patient only complained of left hip pain and did not provide details regarding her fall.  Most of the history was obtained from ED physician and caregiver at bedside, per caregiver, patient sustained an unwitnessed fall after she left for home this evening.   Apparently, patient fell and sustained scalp laceration and hip pain with difficulty in being able to bear weight on the left lower extremity due to pain.  EMS was activated, and patient was taken to the ED for further evaluation and management.  Patient sustained bleeding from the scalp laceration, but she denies fever, chills, chest pain, shortness of breath, nausea or vomiting.  Patient's last alcohol consumption was today. Status post Cephalomedullary nail for left intertrochanteric femur fracture.    PT Comments    Patient required much encouragement to attempt functional activity and limited to partial long sitting in bed due to c/o severe pain in left hip with any movement.  Patient tolerated long sitting for up to 20-30 seconds before requesting to lie down due to LLE pain and easily agitated.  Patient will benefit from continued skilled physical therapy in hospital and recommended venue below to increase strength, balance, endurance for safe ADLs and gait.     Recommendations for follow up therapy are one component of a multi-disciplinary discharge planning process, led by the attending physician.  Recommendations may be updated based on patient status, additional functional criteria and insurance  authorization.  Follow Up Recommendations  Skilled nursing-short term rehab (<3 hours/day) Can patient physically be transported by private vehicle: No   Assistance Recommended at Discharge Intermittent Supervision/Assistance  Patient can return home with the following A lot of help with bathing/dressing/bathroom;A lot of help with walking and/or transfers;Help with stairs or ramp for entrance;Assistance with cooking/housework   Equipment Recommendations  None recommended by PT    Recommendations for Other Services       Precautions / Restrictions Precautions Precautions: Fall Restrictions Weight Bearing Restrictions: Yes LLE Weight Bearing: Weight bearing as tolerated     Mobility  Bed Mobility Overal bed mobility: Needs Assistance Bed Mobility: Supine to Sit     Supine to sit: Max assist, HOB elevated     General bed mobility comments: limited to partially long sitting in bed due to c/o severe pain left hip    Transfers                        Ambulation/Gait                   Stairs             Wheelchair Mobility    Modified Rankin (Stroke Patients Only)       Balance Overall balance assessment: Needs assistance Sitting-balance support: Feet unsupported, Bilateral upper extremity supported Sitting balance-Leahy Scale: Poor Sitting balance - Comments: long sitting in bed  Cognition Arousal/Alertness: Awake/alert Behavior During Therapy: WFL for tasks assessed/performed Overall Cognitive Status: Within Functional Limits for tasks assessed                                          Exercises      General Comments        Pertinent Vitals/Pain Pain Assessment Pain Assessment: Faces Faces Pain Scale: Hurts even more Pain Location: L hip Pain Descriptors / Indicators: Sore, Guarding, Grimacing Pain Intervention(s): Limited activity within patient's  tolerance, Monitored during session, Repositioned, Premedicated before session    Home Living                          Prior Function            PT Goals (current goals can now be found in the care plan section) Acute Rehab PT Goals Patient Stated Goal: return home with family/caregiver to assist PT Goal Formulation: With patient/family Time For Goal Achievement: 08/26/22 Potential to Achieve Goals: Good Progress towards PT goals: Progressing toward goals    Frequency    Min 3X/week      PT Plan Current plan remains appropriate    Co-evaluation              AM-PAC PT "6 Clicks" Mobility   Outcome Measure  Help needed turning from your back to your side while in a flat bed without using bedrails?: A Lot Help needed moving from lying on your back to sitting on the side of a flat bed without using bedrails?: A Lot Help needed moving to and from a bed to a chair (including a wheelchair)?: Total Help needed standing up from a chair using your arms (e.g., wheelchair or bedside chair)?: Total Help needed to walk in hospital room?: Total Help needed climbing 3-5 steps with a railing? : Total 6 Click Score: 8    End of Session   Activity Tolerance: Patient limited by pain;Treatment limited secondary to agitation Patient left: in bed;with call bell/phone within reach;with family/visitor present Nurse Communication: Mobility status PT Visit Diagnosis: Unsteadiness on feet (R26.81);Other abnormalities of gait and mobility (R26.89);Muscle weakness (generalized) (M62.81)     Time: 0998-3382 PT Time Calculation (min) (ACUTE ONLY): 13 min  Charges:  $Therapeutic Activity: 8-22 mins                     3:53 PM, 08/13/22 [name], MPT Physical Therapist with [facility] [phone] office [phone] mobile phone

## 2022-08-13 NOTE — Progress Notes (Signed)
Patient tolerated blood transfusion with no adverse reaction. H/H ordered. She is alert but still confused at times. Able to remember this writer's name and hospital .    08/13/22 1444  Vitals  Temp 98 F (36.7 C)  BP (!) 130/94  Pulse Rate 90  Resp 20  MEWS COLOR  MEWS Score Color Green  MEWS Score  MEWS Temp 0  MEWS Systolic 0  MEWS Pulse 0  MEWS RR 0  MEWS LOC 0  MEWS Score 0

## 2022-08-13 NOTE — Progress Notes (Signed)
Palliative: Mrs. Goodell is resting quietly in bed.  She is alert, making and somewhat keeping eye contact.  I believe that she can make her basic needs known.  Her daughter, [name], is present at bedside.  We talked about Mrs. Moman acute hip fracture and repair and fracture at C6.  We talk about the treatment plan in general, reassurance provided.  At this point Mrs. Raupp will clearly benefit from short-term rehab.  I greatly encouraged her to find a balance and keeping comfortable and keeping moving as this will ensure that she is able to return to her own home and be independent.  She states agreement.  Conference with attending, bedside nursing staff, transition of care team related to patient condition, needs, goals of care, disposition.  Plan:   Continue to treat the treatable but no CPR or intubation.  Short-term rehab if agreeable with the ultimate goal of returning home.  54 minutes Quinn Axe, NP Palliative medicine team Team phone [phone] Greater than 50% of this time was spent counseling and coordinating care related to the above assessment and plan.

## 2022-08-13 NOTE — Progress Notes (Signed)
   ORTHOPAEDIC PROGRESS NOTE  s/p Procedure(s): Left hip cephalomedullary nail for a reverse obliquity intertrochanteric femur fracture   DOS: 08/11/2022  SUBJECTIVE: Follow up CBC with low hemoglobin, awaiting transfusion.  No issues over night.  She does not feel well.  Worked with PT yesterday.     OBJECTIVE: PE:  Alert.  Answers some questions appropriately.  Wide awake this morning.  Appears comfortable.   Small amount of drainage on lateral incisions, intact otherwise.  Some swelling and bruising over the lateral thigh. Thigh is soft and compressible.  Active motion intact in the TA/EHL Sensation intact over the dorsum of her foot  Vitals:   08/13/22 0622 08/13/22 0838  BP: (!) 128/26 135/69  Pulse: 89 (!) 102  Resp: 18 20  Temp: 98.2 F (36.8 C) 98.1 F (36.7 C)  SpO2: 97% 95%       Latest Ref Rng & Units 08/13/2022    4:25 AM 08/12/2022    4:21 AM 08/11/2022    5:05 AM  CBC  WBC 4.0 - 10.5 K/uL 8.2  13.0  14.0   Hemoglobin 12.0 - 15.0 g/dL 6.4  7.7  8.2   Hematocrit 36.0 - 46.0 % 20.0  23.7  25.5   Platelets 150 - 400 K/uL 91  96  129      ASSESSMENT: [name] is a 81 y.o. female doing well postoperatively.  HB low again today, recommend transfusion with 1-2 units PRBC.  Waiting for placement in SNF   PLAN: Weightbearing: WBAT LLE Insicional and dressing care: Reinforce dressings as needed Orthopedic device(s): None VTE prophylaxis: Aspirin '81mg'$  BID  x 6 weeks; to being POD#1 Pain control: As needed.  Judicious use of narcotics.  Follow - up plan: approximately 2 weeks postop for incision check and XR.  If the patient will be returning to a nursing facility, staples can be removed around this time and a follow up appointment can be scheduled for 6 weeks after surgery.   Contact information:     Jaeley Wiker A. Amedeo Kinsman, MD [facility] [address] [place],  [place]  [address] Phone: [phone] Fax: [phone]

## 2022-08-14 ENCOUNTER — Encounter (HOSPITAL_COMMUNITY): Payer: Self-pay | Admitting: Orthopedic Surgery

## 2022-08-14 DIAGNOSIS — R488 Other symbolic dysfunctions: Secondary | ICD-10-CM | POA: Diagnosis not present

## 2022-08-14 DIAGNOSIS — R278 Other lack of coordination: Secondary | ICD-10-CM | POA: Diagnosis not present

## 2022-08-14 DIAGNOSIS — S72142A Displaced intertrochanteric fracture of left femur, initial encounter for closed fracture: Secondary | ICD-10-CM | POA: Diagnosis not present

## 2022-08-14 DIAGNOSIS — Z7189 Other specified counseling: Secondary | ICD-10-CM | POA: Diagnosis not present

## 2022-08-14 DIAGNOSIS — Z4789 Encounter for other orthopedic aftercare: Secondary | ICD-10-CM | POA: Diagnosis not present

## 2022-08-14 DIAGNOSIS — R569 Unspecified convulsions: Secondary | ICD-10-CM | POA: Diagnosis not present

## 2022-08-14 DIAGNOSIS — S72142D Displaced intertrochanteric fracture of left femur, subsequent encounter for closed fracture with routine healing: Secondary | ICD-10-CM | POA: Diagnosis not present

## 2022-08-14 DIAGNOSIS — Z7401 Bed confinement status: Secondary | ICD-10-CM | POA: Diagnosis not present

## 2022-08-14 DIAGNOSIS — R456 Violent behavior: Secondary | ICD-10-CM | POA: Diagnosis not present

## 2022-08-14 DIAGNOSIS — S129XXD Fracture of neck, unspecified, subsequent encounter: Secondary | ICD-10-CM | POA: Diagnosis not present

## 2022-08-14 DIAGNOSIS — M6281 Muscle weakness (generalized): Secondary | ICD-10-CM | POA: Diagnosis not present

## 2022-08-14 DIAGNOSIS — Z515 Encounter for palliative care: Secondary | ICD-10-CM | POA: Diagnosis not present

## 2022-08-14 LAB — BPAM RBC
Blood Product Expiration Date: 202310182359
Blood Product Expiration Date: 202310182359
Blood Product Expiration Date: 202310192359
ISSUE DATE / TIME: 202309110929
ISSUE DATE / TIME: 202309130846
ISSUE DATE / TIME: 202309131143
Unit Type and Rh: 5100
Unit Type and Rh: 5100
Unit Type and Rh: 5100

## 2022-08-14 LAB — TYPE AND SCREEN
ABO/RH(D): O POS
Antibody Screen: NEGATIVE
Unit division: 0
Unit division: 0
Unit division: 0

## 2022-08-14 LAB — CBC
HCT: 37 % (ref 36.0–46.0)
Hemoglobin: 11.6 g/dL — ABNORMAL LOW (ref 12.0–15.0)
MCH: 29.3 pg (ref 26.0–34.0)
MCHC: 31.4 g/dL (ref 30.0–36.0)
MCV: 93.4 fL (ref 80.0–100.0)
Platelets: 104 10*3/uL — ABNORMAL LOW (ref 150–400)
RBC: 3.96 MIL/uL (ref 3.87–5.11)
RDW: 16.4 % — ABNORMAL HIGH (ref 11.5–15.5)
WBC: 10.2 10*3/uL (ref 4.0–10.5)
nRBC: 0 % (ref 0.0–0.2)

## 2022-08-14 MED ORDER — ACETAMINOPHEN 500 MG PO TABS: 1000.0000 mg | ORAL_TABLET | Freq: Three times a day (TID) | ORAL | 0 refills | Status: AC

## 2022-08-14 MED ORDER — MUPIROCIN 2 % EX OINT: 1.0000 | TOPICAL_OINTMENT | Freq: Two times a day (BID) | CUTANEOUS | 0 refills | Status: AC

## 2022-08-14 MED ORDER — ENOXAPARIN SODIUM 30 MG/0.3ML IJ SOSY: 30.0000 mg | PREFILLED_SYRINGE | INTRAMUSCULAR | 0 refills | Status: AC

## 2022-08-14 NOTE — Progress Notes (Signed)
Report given to [place] at [facility].

## 2022-08-14 NOTE — Progress Notes (Signed)
CSW updated admissions at [facility] that we are still awaiting EMS transport, facility states they will take pt for admission at any time. CSW updated RN of this.

## 2022-08-14 NOTE — Care Management Important Message (Signed)
Important Message  Patient Details  Name: [name] MRN: [id number] Date of Birth: 1941-06-04   Medicare Important Message Given:  N/A - LOS <3 / Initial given by admissions     Tommy Medal 08/14/2022, 2:46 PM

## 2022-08-14 NOTE — Progress Notes (Signed)
Palliative:   Mrs. Cech is lying quietly in bed.  She appears chronically ill and somewhat frail.  She is awake, raising her hand reaching toward something that is on same.  She has a paid caregiver present at bedside.  Ms. Sobecki is experiencing periods of confusion.  Overall she looks more alert today.  Daughter [name] and I have a conversation in the waiting area.  We talk about anticipated discharge today.  We talk about the chronic illness changes that were seen in Mrs. Saravia.  We talk about finding a balance and respecting what she does and does not want an attempt to give her meaningful time.   We talk about the benefits of outpatient palliative services and how to put these services in place if patient family so desire in the future.  Conference with attending, bedside nursing staff, transition of care team related to patient condition, needs, goals of care, disposition. DNR/goldenrod form on chart.  Plan: At this point continue to treat the treatable but no CPR or intubation.  Time for outcomes.  Short-term rehab.  Would benefit from outpatient palliative services.  69 minutes  Quinn Axe, NP Palliative medicine team Team phone [phone] Greater than 50% of this time was spent counseling and coordinating care related to the above assessment and plan.

## 2022-08-14 NOTE — TOC Transition Note (Addendum)
Transition of Care [facility]) - CM/SW Discharge Note   Patient Details  Name: [name] MRN: [id number] Date of Birth: 11/19/1941  [facility] CM/SW Contact:  Iona Beard, [place] Phone Number: 08/14/2022, 10:49 AM   Clinical Narrative:    CSW noted acceptance at [facility]. CSW spoke to [facility] in admissions who states they have a private room opening today and can accept pt. CSW spoke with pts daughter who accepts this bed offer. CSW updated facility. CSW provided RN with numbers for room and report. CSW to set up transportation when RN alerts pt is ready. EMS called for transport. Med necessity completed and printed to the floor. TOC signing off.   Final next level of care: Skilled Nursing Facility Barriers to Discharge: Barriers Resolved   Patient Goals and CMS Choice Patient states their goals for this hospitalization and ongoing recovery are:: go to SNF CMS Medicare.gov Compare Post Acute Care list provided to:: Patient Represenative (must comment) Choice offered to / list presented to : Patient, Adult Children  Discharge Placement              Patient chooses bed at: [facility] Patient to be transferred to facility by: EMS or [facility] transport Name of family member notified: Myles Gip (daughter) Patient and family notified of of transfer: 08/14/22  Discharge Plan and Services                                     Social Determinants of Health ([place]) Interventions     Readmission Risk Interventions    08/12/2022   10:00 AM 11/06/2021   12:44 PM  Readmission Risk Prevention Plan  Transportation Screening Complete Complete  PCP or Specialist Appt within 3-5 Days Not Complete   Home Care Screening  Complete  Medication Review (RN CM)  Complete  HRI or Home Care Consult Complete   Social Work Consult for [place] Planning/Counseling Complete   Palliative Care Screening Not Applicable   Medication Review [employer] photographer)  Complete

## 2022-08-14 NOTE — Discharge Summary (Addendum)
Physician Discharge Summary   Patient: [name] MRN: [id number] DOB: 01-18-1941  Admit date:     08/10/2022  Discharge date: 08/14/22  Discharge Physician: Deatra James   PCP: Asencion Noble, MD   Recommendations at discharge:  Follow-up with orthopedic/surgery team within 2-3 weeks Follow with a PCP in 1-2 weeks Aggressive PT OT, fall precautions Follow-up palliative care-  Discharge Diagnoses: Principal Problem:   Closed comminuted intertrochanteric fracture of left femur, initial encounter ([place]) Active Problems:   Depression   Hyponatremia   Alcohol dependence ([place])   Fall   Leukocytosis   Dehydration   Essential hypertension   Scalp hematoma   Fracture of cervical spinous process (HCC)   Failure to thrive in adult   Hypoalbuminemia due to protein-calorie malnutrition (HCC)   Hyperglycemia  Resolved Problems:   * No resolved hospital problems. *  [name] is a 81 y.o. female with medical history significant of hypertension, seizures, alcohol dependence, depression admitted on 08/10/2022 treated with left hip fracture mechanical fall at home in the setting of history of EtOH use      Lt Hip Fx--left intertrochanteric femur fracture; reverse obliquity pattern On 08/11/22 underwent Cephalomedullary nail for left intertrochanteric femur fracture -Post-operative plan:  Weightbearing: The patient will be WBAT on the operative extremity.   DVT prophylaxis -start Lovenox on 08/12/2022 - total of 30 days  Pain control with PRN pain medication  Dressing can be reinforced as needed, will change on POD#2/3 if needed.   Follow up plan: approximately 2 weeks postop for incision check and XR.  If the patient will be returning to a nursing facility, staples can be removed around this time and a follow up appointment can be scheduled for 6 weeks after surgery. XR at next visit:  please obtain AP pelvis, and 2 views of the Left hip/femur -PT OT eval appreciated  recommends SNF rehab   Acute fracture of the spinous process of C6.  -No vertebral body or pedicle fracture--- --Neurosurgery was consulted in relation to C6 spinous process noted on CT cervical spine and recommended neck collar -Outpatient follow-up with neurosurgery in 2 to 3 weeks advised   Closed head injury/scalp laceration--- head wound is hemostatic -CT head without acute findings -Supportive care  Anemia of chronic disease exacerbated by acute blood loss in setting of hip fracture/preoperative blood loss      Latest Ref Rng & Units 08/14/2022    6:27 AM 08/13/2022    5:16 PM 08/13/2022    4:25 AM  CBC  WBC 4.0 - 10.5 K/uL 10.2   8.2   Hemoglobin 12.0 - 15.0 g/dL 11.6  10.5  6.4   Hematocrit 36.0 - 46.0 % 37.0  32.4  20.0   Platelets 150 - 400 K/uL 104   91       -Due to generalized weaknesses, transfused 2 units of PRBC on 08/13/2021 Pros and cons of blood transfusion was discussed with the patient and her daughter at bedside--agreed to proceed   - due to acute blood loss in the setting of hip fracture/perioperative blood loss-- -Hgb is down to 7.7 baseline usually above 9 >>> 6.4 transfusing 2 unit PRBC>>> 11.6     AKI----acute kidney injury - -baseline creatinine usually between 0.9 to 1.0  -Creatinine currently 1.42 partly due to dehydration in the setting of poor oral intake -continue IV fluids until oral intake is more reliable -- renally adjust medications, avoid nephrotoxic agents / dehydration  / hypotension  Alcohol Abuse--- risk of DTs -Benzos per CIWA protocol -Multivitamins ordered   HTN--continue metoprolol .  IV hydralazine as needed better BP   Acute hypoxic respiratory failure-- - stable satting 94% on room air - transient hypoxia appears to have resolved -Patient successfully weaned off oxygen on 08/11/2022   Aacute on chronic anemia--- Leukocytosis----WBC 18.6 >> 13.0  >>. 8.2 >>10.2   Reactive in the setting of fall/trauma and hip and  C-spine fracture -No fevers, chest x-ray without acute infectious process    Status is: Inpatient    Disposition: The patient is from: Home              Anticipated d/c is to: SNF    Code Status :  -  Code Status: DNR    Family Communication:    Discussed with caregiver at bedside DVT Prophylaxis  :   - SCDs  enoxaparin (LOVENOX) injection 30 mg Start: 08/12/22 1000       Consultants: ortho  Procedures performed: ORIF Disposition: Skilled nursing facility Diet recommendation:  Discharge Diet Orders (From admission, onward)     Start     Ordered   08/14/22 0000  Diet - low sodium heart healthy        08/14/22 0814           Cardiac diet DISCHARGE MEDICATION: Allergies as of 08/14/2022       Reactions   Clindamycin Rash   Clindamycin/lincomycin Rash   Doxycycline Rash   Doxycycline Hyclate Rash        Medication List     STOP taking these medications    spironolactone 25 MG tablet Commonly known as: ALDACTONE   vancomycin 125 MG capsule Commonly known as: VANCOCIN       TAKE these medications    acetaminophen 500 MG tablet Commonly known as: TYLENOL Take 2 tablets (1,000 mg total) by mouth 3 (three) times daily.   Biotin 2.5 MG Tabs Take 1 tablet by mouth daily.   CALCIUM + D3 PO Take 1 tablet by mouth daily with breakfast.   cycloSPORINE 0.05 % ophthalmic emulsion Commonly known as: RESTASIS Place 1 drop into both eyes 2 (two) times daily.   enoxaparin 30 MG/0.3ML injection Commonly known as: LOVENOX Inject 0.3 mLs (30 mg total) into the skin daily.   escitalopram 20 MG tablet Commonly known as: LEXAPRO Take 1 tablet (20 mg total) by mouth in the morning.   folic acid 1 MG tablet Commonly known as: FOLVITE Take 1 tablet (1 mg total) by mouth daily.   furosemide 20 MG tablet Commonly known as: LASIX Take 20 mg by mouth every morning.   Keppra 500 MG tablet Generic drug: levETIRAcetam Take 1 tablet by mouth daily.    levothyroxine 50 MCG tablet Commonly known as: SYNTHROID Take 50 mcg by mouth every morning.   metoprolol succinate 25 MG 24 hr tablet Commonly known as: TOPROL-XL Take 1 tablet (25 mg total) by mouth daily.   multivitamin with minerals Tabs tablet Take 1 tablet by mouth daily.   mupirocin ointment 2 % Commonly known as: BACTROBAN Place 1 Application into the nose 2 (two) times daily for 5 days.   potassium citrate 10 MEQ (1080 MG) SR tablet Commonly known as: UROCIT-K Take 10 mEq by mouth daily.   thiamine 100 MG tablet Commonly known as: VITAMIN B1 Take 100 mg by mouth daily.               Discharge Care Instructions  (From  admission, onward)           Start     Ordered   08/14/22 0000  Discharge wound care:       Comments: Per orthopedic team recommendations   08/14/22 0814            Contact information for after-discharge care     Destination     [facility] .   Service: Skilled Nursing Contact information: [address] [place] [phone]                    Discharge Exam: Danley Danker Weights   08/10/22 2119  Weight: 40 kg      Physical Exam:   General:  AAO x 3,  cooperative, no distress;   HEENT:  Normocephalic, PERRL, otherwise with in Normal limits   Neuro:  CNII-XII intact. , normal motor and sensation, reflexes intact   Lungs:   Clear to auscultation BL, Respirations unlabored,  No wheezes / crackles  Cardio:    S1/S2, RRR, No murmure, No Rubs or Gallops   Abdomen:  Soft, non-tender, bowel sounds active all four quadrants, no guarding or peritoneal signs.  Muscular  skeletal:  Limited exam -global generalized weaknesses - in bed, able to move all 4 extremities,   2+ pulses,  symmetric, No pitting edema  Skin:  Dry, warm to touch, negative for any Rashes,  Wounds: Please see nursing documentation     Condition at discharge: fair  The results of significant  diagnostics from this hospitalization (including imaging, microbiology, ancillary and laboratory) are listed below for reference.   Imaging Studies: DG FEMUR MIN 2 VIEWS LEFT  Result Date: 08/11/2022 CLINICAL DATA:  Status post left intramedullary nail fixation of left hip EXAM: LEFT FEMUR 2 VIEWS COMPARISON:  Pelvis and left hip radiographs 08/10/2022 FINDINGS: Interval left femoral long intramedullary nail fixation of the previously seen comminuted intertrochanteric fracture. Transverse proximal diaphyseal interlocking screw and left femoral neck screw. Improved, near anatomic alignment. There is again superior displacement of the lesser trochanter. No evidence of hardware failure. Moderate to severe patellofemoral joint space narrowing and osteophytosis. No knee joint effusion. Mild medial contour of the knee joint space narrowing. Mild calcific density lateral to the lateral compartment of the knee. Postoperative changes of lateral hip surgical skin staples, soft tissue swelling, and subcutaneous air. IMPRESSION: Interval ORIF of the prior comminuted displaced left intertrochanteric fracture. Electronically Signed   By: Yvonne Kendall M.D.   On: 08/11/2022 11:49   DG Pelvis Portable  Result Date: 08/11/2022 CLINICAL DATA:  Status post left IM nail EXAM: PORTABLE PELVIS 1-2 VIEWS COMPARISON:  Earlier today FINDINGS: Postoperative changes from open reduction and internal fixation of the left intertrochanteric femur fracture. There is been interval placement of intramedullary rod and dynamic hip screw into the left femur and across the left femoral neck. Fracture fragments are in near anatomic alignment. IMPRESSION: Status post ORIF of left intertrochanteric femur fracture. Electronically Signed   By: Kerby Moors M.D.   On: 08/11/2022 11:38   DG FEMUR MIN 2 VIEWS LEFT  Result Date: 08/11/2022 CLINICAL DATA:  Intraoperative fluoroscopic images from LEFT femoral intramedullary nailing. EXAM: LEFT FEMUR  2 VIEWS; DG C-ARM 1-60 MIN-NO REPORT COMPARISON:  Previous imaging from August 10, 2022 FINDINGS: Comminuted fracture of the intratrochanteric LEFT femur is redemonstrated. Intraoperative spot fluoroscopic views a total of 7 images are provided displaying placement of an intramedullary rod and dynamic hip screw  into the LEFT femur and across the LEFT femoral neck. Near anatomic alignment is noted. Serpiginous radiodense material is seen overlying the distal femur on image 5, favored to be external to the patient the could potentially represent markers for sponge material in the operative setting. No unexpected findings in the operative setting. Note that there appears to be interlocking screw in the mid to distal portion of the femoral nail obscured by shielding material placed over the patient. IMPRESSION: 1. Signs of ORIF of the LEFT intratrochanteric fracture with comminution, limited intraoperative fluoroscopic views. 2. Radiodense serpiginous structure over the distal femur on image 5 of 8 (potentially external to the patient though nonspecific and could represent markers for packing and or sponge material during the surgical procedure) and partially obscured intramedullary nail due to overlying shielding material. Correlate with operative history and consider attention on subsequent imaging. These results will be called to the ordering clinician or representative by the Radiologist Assistant, and communication documented in the PACS or [employer]. Electronically Signed   By: Zetta Bills M.D.   On: 08/11/2022 11:29   DG C-Arm 1-60 Min-No Report  Result Date: 08/11/2022 Fluoroscopy was utilized by the requesting physician.  No radiographic interpretation.   CT Head Wo Contrast  Result Date: 08/10/2022 CLINICAL DATA:  Fall EXAM: CT HEAD WITHOUT CONTRAST CT CERVICAL SPINE WITHOUT CONTRAST TECHNIQUE: Multidetector CT imaging of the head and cervical spine was performed following the standard  protocol without intravenous contrast. Multiplanar CT image reconstructions of the cervical spine were also generated. RADIATION DOSE REDUCTION: This exam was performed according to the departmental dose-optimization program which includes automated exposure control, adjustment of the mA and/or kV according to patient size and/or use of iterative reconstruction technique. COMPARISON:  11/02/2021 CT head and cervical spine FINDINGS: CT HEAD FINDINGS Brain: No evidence of acute infarct, hemorrhage, mass, mass effect, or midline shift. No hydrocephalus or extra-axial fluid collection. Vascular: No hyperdense vessel. Skull: Left parietal scalp hematoma. Negative for fracture or focal lesion. Sinuses/Orbits: No acute finding. Other: The mastoid air cells are well aerated. CT CERVICAL SPINE FINDINGS Alignment: Straightening of the normal cervical lordosis. Skull base and vertebrae: Acute fracture of the spinous process of C6 (series 5, image 40 and series 3, image 50). No acute vertebral body fracture or pedicle fracture. No suspicious osseous lesion. Soft tissues and spinal canal: No prevertebral fluid or swelling. No visible canal hematoma. Disc levels: Multilevel disc height loss without high-grade spinal canal stenosis. Upper chest: Unchanged 2 mm nodule in the right apex (series 4, image 89). No focal pulmonary opacity or pleural effusion. Other: None. IMPRESSION: 1. Acute fracture of the spinous process of C6. No vertebral body or pedicle fracture. 2. No acute intracranial process. Electronically Signed   By: Merilyn Baba M.D.   On: 08/10/2022 22:41   CT Cervical Spine Wo Contrast  Result Date: 08/10/2022 CLINICAL DATA:  Fall EXAM: CT HEAD WITHOUT CONTRAST CT CERVICAL SPINE WITHOUT CONTRAST TECHNIQUE: Multidetector CT imaging of the head and cervical spine was performed following the standard protocol without intravenous contrast. Multiplanar CT image reconstructions of the cervical spine were also generated.  RADIATION DOSE REDUCTION: This exam was performed according to the departmental dose-optimization program which includes automated exposure control, adjustment of the mA and/or kV according to patient size and/or use of iterative reconstruction technique. COMPARISON:  11/02/2021 CT head and cervical spine FINDINGS: CT HEAD FINDINGS Brain: No evidence of acute infarct, hemorrhage, mass, mass effect, or midline shift. No hydrocephalus  or extra-axial fluid collection. Vascular: No hyperdense vessel. Skull: Left parietal scalp hematoma. Negative for fracture or focal lesion. Sinuses/Orbits: No acute finding. Other: The mastoid air cells are well aerated. CT CERVICAL SPINE FINDINGS Alignment: Straightening of the normal cervical lordosis. Skull base and vertebrae: Acute fracture of the spinous process of C6 (series 5, image 40 and series 3, image 50). No acute vertebral body fracture or pedicle fracture. No suspicious osseous lesion. Soft tissues and spinal canal: No prevertebral fluid or swelling. No visible canal hematoma. Disc levels: Multilevel disc height loss without high-grade spinal canal stenosis. Upper chest: Unchanged 2 mm nodule in the right apex (series 4, image 89). No focal pulmonary opacity or pleural effusion. Other: None. IMPRESSION: 1. Acute fracture of the spinous process of C6. No vertebral body or pedicle fracture. 2. No acute intracranial process. Electronically Signed   By: Merilyn Baba M.D.   On: 08/10/2022 22:41   DG Chest 1 View  Result Date: 08/10/2022 CLINICAL DATA:  Fall injury.  [id number]. EXAM: CHEST  1 VIEW COMPARISON:  Portable chest 01/17/2022. FINDINGS: The lungs are hyperaerated. Calcified granuloma is again noted in the left lower lung field. No focal pneumonia is seen. There is mild chronic elevation of the left diaphragm. The sulci are sharp. Heart size and vasculature are normal. Stable mediastinum with aortic atherosclerosis and tortuosity. Osteopenia and lower thoracic  dextroscoliosis. IMPRESSION: No evidence of acute chest process. Stable hyperinflated chest. Aortic atherosclerosis. Electronically Signed   By: Telford Nab M.D.   On: 08/10/2022 22:39   DG Hip Unilat With Pelvis 2-3 Views Left  Result Date: 08/10/2022 CLINICAL DATA:  Left hip pain after fall EXAM: DG HIP (WITH OR WITHOUT PELVIS) 2-3V LEFT COMPARISON:  Radiographs 12/10/2005 FINDINGS: Demineralization. Comminuted displaced intertrochanteric fracture of the left hip. There is slight apex lateral angulation and medial displacement of the distal fragment. The left femoral head is rotated superiorly but remains within the left acetabulum. No additional fractures. Degenerative arthritis right hip. Advanced degenerative changes lower lumbar spine. IMPRESSION: Comminuted displaced left intertrochanteric fracture. Electronically Signed   By: Placido Sou M.D.   On: 08/10/2022 22:32    Microbiology: Results for orders placed or performed during the hospital encounter of 08/10/22  Surgical PCR screen     Status: Abnormal   Collection Time: 08/11/22  5:29 AM   Specimen: Nasal Mucosa; Nasal Swab  Result Value Ref Range Status   MRSA, PCR NEGATIVE NEGATIVE Final   Staphylococcus aureus POSITIVE (A) NEGATIVE Final    Comment: RESULT CALLED TO, READ BACK BY AND VERIFIED WITH: S. BLACKWELL @ [phone] BY STEPHTR 08/11/22 (NOTE) The Xpert SA Assay (FDA approved for NASAL specimens in patients 7 years of age and older), is one component of a comprehensive surveillance program. It is not intended to diagnose infection nor to guide or monitor treatment. Performed at [facility], [address], [place], [place] [address]     Labs: CBC: Recent Labs  Lab 08/10/22 2244 08/11/22 0505 08/12/22 0421 08/13/22 0425 08/13/22 1716 08/14/22 0627  WBC 18.6* 14.0* 13.0* 8.2  --  10.2  NEUTROABS 15.4*  --   --   --   --   --   HGB 10.1* 8.2* 7.7* 6.4* 10.5* 11.6*  HCT 30.8* 25.5* 23.7* 20.0* 32.4* 37.0  MCV  88.3 89.8 90.1 93.0  --  93.4  PLT 164 129* 96* 91*  --  382*   Basic Metabolic Panel: Recent Labs  Lab 08/10/22 2244 08/11/22 0505  08/12/22 0421 08/13/22 0425  NA 129* 136 136 138  K 3.7 4.4 4.4 3.5  CL 96* 106 108 112*  CO2 '22 24 24 '$ 21*  GLUCOSE 162* 154* 128* 108*  BUN 18 19 24* 21  CREATININE 1.31* 1.26* 1.42* 1.21*  CALCIUM 8.5* 8.3* 7.7* 7.7*  MG  --  1.8  --   --   PHOS  --  4.9*  --   --    Liver Function Tests: Recent Labs  Lab 08/10/22 2244 08/11/22 0505  AST 25 22  ALT 15 13  ALKPHOS 87 70  BILITOT 0.9 0.8  PROT 6.1* 5.3*  ALBUMIN 3.4* 3.0*   CBG: No results for input(s): "GLUCAP" in the last 168 hours.  Discharge time spent: greater than 30 minutes.  Signed: Deatra James, MD [facility] 08/14/2022

## 2022-08-15 ENCOUNTER — Encounter (HOSPITAL_COMMUNITY): Payer: Self-pay | Admitting: Orthopedic Surgery

## 2022-08-21 ENCOUNTER — Other Ambulatory Visit: Payer: Self-pay | Admitting: *Deleted

## 2022-08-21 NOTE — Patient Outreach (Signed)
[name] resides in [facility]. Screening for potential [facility] care coordination needs as benefit of insurance plan and PCP.   Secure communication sent to [facility] SW to make aware writer is follow for potential [facility] services.   Marthenia Rolling, MSN, RN,BSN [place] Acute Care Coordinator [phone] (Direct dial)

## 2022-08-25 ENCOUNTER — Ambulatory Visit (INDEPENDENT_AMBULATORY_CARE_PROVIDER_SITE_OTHER): Payer: Medicare Other

## 2022-08-25 ENCOUNTER — Encounter: Payer: Self-pay | Admitting: Orthopedic Surgery

## 2022-08-25 ENCOUNTER — Other Ambulatory Visit: Payer: Self-pay | Admitting: *Deleted

## 2022-08-25 ENCOUNTER — Ambulatory Visit (INDEPENDENT_AMBULATORY_CARE_PROVIDER_SITE_OTHER): Payer: Medicare Other | Admitting: Orthopedic Surgery

## 2022-08-25 DIAGNOSIS — S72142A Displaced intertrochanteric fracture of left femur, initial encounter for closed fracture: Secondary | ICD-10-CM

## 2022-08-25 DIAGNOSIS — S12591A Other nondisplaced fracture of sixth cervical vertebra, initial encounter for closed fracture: Secondary | ICD-10-CM

## 2022-08-25 NOTE — Patient Instructions (Signed)
Incisions are healing well.  Keep them clean and dry.  Okay to let soap and water run over the incisions.  Do not scrub the incisions.  Pat the incisions dry.  Do not submerge the incisions in a bath, pool, hot tub etc  Weightbearing as tolerated.  Use a walker for assistance.  Follow-up in 4 weeks for repeat evaluation.  Please contact the clinic if there are any questions or concerns.

## 2022-08-25 NOTE — Patient Outreach (Signed)
Mrs. [name] resides in [facility]. Screening for potential [facility] care coordination services as benefit of insurance plan and PCP.  Met with SNF SW at [facility] who reports [name] is from home alone with caregivers for 12 hours a day. SNF is recommending 24/7 care giver assist upon returning home. [name] is out of the facility for MD appointment. [place], SNF SW reports daughter [name] is primary contact.  Will continue to follow and plan outreach as appropriate.   Marthenia Rolling, MSN, RN,BSN [place] Acute Care Coordinator [phone] (Direct dial)

## 2022-08-25 NOTE — Progress Notes (Signed)
Orthopaedic Postop Note  Assessment: [name] is a 81 y.o. female s/p cephalomedullary nail for Left intertrochanteric femur fracture, reverse obliquity  DOS: 08/11/2022  Plan: [name] is recovering appropriately.   Staples removed, steri strips placed Continue with protective WBAT Continue with DVT prophylaxis for at least 6 weeks after surgery WBAT on the operative extremity Referral to neurosurgery for C6 spinous process fracture Follow up in 4 weeks; call with any issues   Follow-up: Return in about 4 weeks (around 09/22/2022). XR at next visit: AP pelvis and Left femur  Subjective:  Chief Complaint  Patient presents with   Routine Post Op    LT hip DOS 08/11/22    History of Present Illness: [name] is a 81 y.o. female who presents following the above stated procedure.  Surgery was 2 weeks ago.  She recovered without issues in the hospital, and has been discharged to a rehab facility.  She remains in a rehabilitation facility.  She is complaining of pain, but has no issues with active motion of the left knee.  She presents to clinic today with her daughter.  She does exhibit some confusion, but is overall doing well.  No issues with her incisions.  She is taking pain medications once in a while.  Review of Systems: No fevers or chills No numbness or tingling No Chest Pain No shortness of breath   Objective: There were no vitals taken for this visit.  Physical Exam:  Alert.  She does not remember being in the hospital.  No acute distress.  Seated in wheelchair.  Surgical incisions are healing well.  No surrounding erythema or drainage.  Able to maintain a straight leg raise.  Active motion intact in the TA/EHL.  Toes are warm and well-perfused.   IMAGING: I personally ordered and reviewed the following images:  XR of the Left femur and AP pelvis demonstrates a well positioned cephalomedullary nail.   The reverse obliquity intertrochanteric  femur fracture remains in stable position, with mild medial translation of the humeral shaft.  There is no evidence of implant subsidence.  No acute fractures are noted.  No concern for cephalomedullary lag screw cut out  Impression: Left reverse obliquity intertrochanteric femur fracture in stable position without evidence   Mordecai Rasmussen, MD 08/25/2022 1:58 PM

## 2022-09-16 ENCOUNTER — Other Ambulatory Visit: Payer: Self-pay | Admitting: *Deleted

## 2022-09-16 NOTE — Patient Outreach (Addendum)
[facility] Post- Acute Care Coordinator follow up. Verified in [facility] [name] transitioned to private pay at [facility].   Secure communication sent to SNF SW to confirm.  Addendum: Update from [place], SNF SW indicating [name] will transition home with 24 hr caregiver assist and home health.   No identifiable [facility] needs at this time.    Angela Rolling, MSN, RN,BSN [place] Acute Care Coordinator [phone] (Direct dial)

## 2022-09-23 ENCOUNTER — Encounter: Payer: PRIVATE HEALTH INSURANCE | Admitting: Orthopedic Surgery

## 2022-09-24 DIAGNOSIS — I10 Essential (primary) hypertension: Secondary | ICD-10-CM | POA: Diagnosis not present

## 2022-09-24 DIAGNOSIS — F329 Major depressive disorder, single episode, unspecified: Secondary | ICD-10-CM | POA: Diagnosis not present

## 2022-09-30 ENCOUNTER — Encounter: Payer: Medicare Other | Admitting: Orthopedic Surgery

## 2022-10-03 DIAGNOSIS — S12550D Other traumatic displaced spondylolisthesis of sixth cervical vertebra, subsequent encounter for fracture with routine healing: Secondary | ICD-10-CM | POA: Diagnosis not present

## 2022-10-03 DIAGNOSIS — S72142D Displaced intertrochanteric fracture of left femur, subsequent encounter for closed fracture with routine healing: Secondary | ICD-10-CM | POA: Diagnosis not present

## 2022-10-03 DIAGNOSIS — I10 Essential (primary) hypertension: Secondary | ICD-10-CM | POA: Diagnosis not present

## 2022-10-03 DIAGNOSIS — I700 Atherosclerosis of aorta: Secondary | ICD-10-CM | POA: Diagnosis not present

## 2022-10-30 DIAGNOSIS — I10 Essential (primary) hypertension: Secondary | ICD-10-CM | POA: Diagnosis not present

## 2022-10-30 DIAGNOSIS — F329 Major depressive disorder, single episode, unspecified: Secondary | ICD-10-CM | POA: Diagnosis not present

## 2022-11-29 DIAGNOSIS — I10 Essential (primary) hypertension: Secondary | ICD-10-CM | POA: Diagnosis not present

## 2022-11-29 DIAGNOSIS — F329 Major depressive disorder, single episode, unspecified: Secondary | ICD-10-CM | POA: Diagnosis not present

## 2023-01-29 ENCOUNTER — Encounter: Payer: Self-pay | Admitting: Radiology

## 2023-04-09 ENCOUNTER — Other Ambulatory Visit (HOSPITAL_COMMUNITY): Payer: Self-pay | Admitting: Internal Medicine

## 2023-04-09 DIAGNOSIS — Z13820 Encounter for screening for osteoporosis: Secondary | ICD-10-CM

## 2023-04-09 DIAGNOSIS — Z1231 Encounter for screening mammogram for malignant neoplasm of breast: Secondary | ICD-10-CM

## 2024-04-18 ENCOUNTER — Other Ambulatory Visit (HOSPITAL_COMMUNITY): Payer: Self-pay | Admitting: Internal Medicine

## 2024-04-18 DIAGNOSIS — Z1231 Encounter for screening mammogram for malignant neoplasm of breast: Secondary | ICD-10-CM

## 2024-04-18 DIAGNOSIS — M810 Age-related osteoporosis without current pathological fracture: Secondary | ICD-10-CM

## 2024-04-18 DIAGNOSIS — N950 Postmenopausal bleeding: Secondary | ICD-10-CM

## 2024-04-27 ENCOUNTER — Ambulatory Visit (HOSPITAL_COMMUNITY): Payer: PRIVATE HEALTH INSURANCE

## 2024-04-27 ENCOUNTER — Encounter (HOSPITAL_COMMUNITY): Payer: Self-pay

## 2024-04-27 ENCOUNTER — Other Ambulatory Visit (HOSPITAL_COMMUNITY): Payer: PRIVATE HEALTH INSURANCE

## 2025-01-05 ENCOUNTER — Ambulatory Visit
Admission: EM | Admit: 2025-01-05 | Discharge: 2025-01-05 | Disposition: A | Discharge status: Home / Self Care | Source: Home / Self Care

## 2025-01-05 DIAGNOSIS — M25552 Pain in left hip: Secondary | ICD-10-CM | POA: Diagnosis not present

## 2025-01-05 DIAGNOSIS — M7989 Other specified soft tissue disorders: Secondary | ICD-10-CM

## 2025-01-05 DIAGNOSIS — L03116 Cellulitis of left lower limb: Secondary | ICD-10-CM | POA: Diagnosis not present

## 2025-01-05 MED ORDER — CEPHALEXIN 500 MG PO CAPS: 500.0000 mg | ORAL_CAPSULE | Freq: Four times a day (QID) | ORAL | 0 refills | Status: AC

## 2025-01-05 MED ORDER — MUPIROCIN 2 % EX OINT: 1.0000 | TOPICAL_OINTMENT | Freq: Two times a day (BID) | CUTANEOUS | 0 refills | Status: AC

## 2025-01-05 MED ORDER — PREDNISONE 20 MG PO TABS: 40.0000 mg | ORAL_TABLET | Freq: Every day | ORAL | 0 refills | Status: AC

## 2025-01-05 NOTE — ED Triage Notes (Signed)
 Pt's caregiver reports right leg pain and swelling, hx of broken hip, denies injury to the hip, states pain is constant and nagging.

## 2025-01-05 NOTE — ED Provider Notes (Signed)
 " [facility]    CSN: [id number] Arrival date & time: 01/05/25  1023      History   Chief Complaint No chief complaint on file.   HPI [name] is a 84 y.o. female.   Patient presents with right hip pain that began this morning per patient and caregiver.  Caregiver reports that when patient was attempting to get out of the bed she was reporting severe pain to her right hip that worsened with movement.  Patient and caregiver deny any recent falls.  Caregiver also reports noticing lower right leg swelling and redness 2 days ago.  There is a scabbed over wound noted to the upper part of the right lower leg that the caregiver reports noticing a week ago, and the patient states that she did not know anything about this.  Denies any known fever.  Denies noticing any drainage from the wound.  Caregiver reports that patient does have a history of some dementia however this is not documented on her chart at this time.  Caregiver reports that patient does have full-time caregivers that rotate out to care for every day.  The history is provided by the patient and medical records.    Past Medical History:  Diagnosis Date   Abdominal wall hernia, periumbilical, fat-containing 05/19/2021   CT AP 05/18/21: Fat containing periumbilical anterior abdominal wall hernia.  Second fat and bowel containing infraumbilical anterior abdominal wall hernia. No CT evidence of bowel incarceration.   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   Depression    GERD (gastroesophageal reflux disease)    Kidney stones    Liver disease    Macrocytic anemia 05/19/2021   Mitral valve regurgitation, moderate 05/22/2021   Multiple closed fractures of ribs of left side 05/18/2021   Osteoporosis    Osteoporosis    Treated with denosumab    Physical deconditioning    Recurrent falls    Recurrent kidney stones    Calcium  oxalate monohydrate on stone analysis 05/2020 at [facility] urology   Seizure [facility])    started on Keppra  after [facility]   Subarachnoid bleed (HCC)    Subarachnoid hemorrhage (HCC) 12/19/2019    Patient Active Problem List   Diagnosis Date Noted   Scalp hematoma 08/11/2022   Fracture of cervical spinous process (HCC) 08/11/2022   Failure to thrive in adult 08/11/2022   Hypoalbuminemia due to protein-calorie malnutrition 08/11/2022   Hyperglycemia 08/11/2022   Closed comminuted intertrochanteric fracture of left femur, initial encounter (HCC) 08/10/2022   Sepsis due to undetermined organism (HCC) 01/23/2022   Normocytic anemia 01/21/2022   Protein-calorie malnutrition, severe 01/20/2022   Elevated TSH 01/19/2022   C. 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 05/19/2021   Cerebral atrophy 05/19/2021   Generalized weakness 05/19/2021   Rhabdomyolysis 05/19/2021   Alcoholic liver disease  05/19/2021   Atherosclerosis of aorta 05/19/2021   Multiple skin tears 05/19/2021   Multiple Bruising sites 05/19/2021   Poor historian 05/19/2021   Acute renal injury 05/19/2021   Transaminitis 05/19/2021   Bilirubinemia 05/19/2021   Hypoproteinemia 05/19/2021   Elevated troponin 05/19/2021   Vitamin B12 deficiency 05/19/2021   Macrocytic anemia 05/19/2021   Leukocytosis 05/19/2021   TSH elevation 05/19/2021   Pyuria 05/19/2021   Hematuria 05/19/2021   Dehydration 05/19/2021   Cerebral atrophy 05/19/2021   Degenerative disc disease, cervical 05/19/2021   Bilateral Periorbital ecchymosis 05/19/2021   Myocardial injury    Elevated CK    Fall 05/18/2021   Multiple closed fractures of ribs of left side 05/18/2021   Alteration consciousness 12/10/2020   Gait abnormality 12/10/2020   Sepsis (HCC) 02/29/2020   Acute lower UTI 02/29/2020   Unsteady gait 01/10/2020   Seizures (HCC) 01/10/2020   Osteoarthritis of left knee    Physical deconditioning 12/19/2019   Alcohol dependence (HCC) 12/19/2019   Thrombocytopenia 12/19/2019   Subarachnoid hemorrhage (HCC) 12/19/2019   SAH (subarachnoid hemorrhage) (HCC)    Transaminasemia    Subarachnoid bleed (HCC) 12/18/2019   Hypokalemia 12/18/2019   Depression    Hyperbilirubinemia    Hyponatremia    Macrocytic anemia    Alcoholic liver disease    Arthritis 05/10/2019   Diarrhea 10/13/2017   Recurrent kidney stones 10/31/2014   Left ureteral stone 09/08/2014   Right ureteral stone 09/08/2014   Infection of urinary tract 09/08/2014   Pain in joint, shoulder region 08/09/2012   Muscle weakness (generalized) 08/09/2012   Impingement syndrome of left shoulder 08/09/2012    Past Surgical History:  Procedure Laterality Date   ABDOMINAL HYSTERECTOMY     vaginal   ABDOMINAL HYSTERECTOMY     Vaginal   APPENDECTOMY     CARPAL TUNNEL RELEASE  01/21/2012   Procedure: CARPAL TUNNEL RELEASE;  Surgeon: Arley JONELLE Curia, MD;  Location: [facility]  [place];  Service: Orthopedics;  Laterality: Right;   CARPAL TUNNEL RELEASE Right 01/02/2012   Dr Curia ([facility])   COLON RESECTION  2004   perf bowel after colonoscopy   CYSTOSCOPY W/ RETROGRADES Bilateral 02/28/2018   Procedure: CYSTOSCOPY WITH BILATERAL RETROGRADE PYELOGRAM;BILATERAL URETERAL STENT PLACEMENT;  Surgeon: Sherrilee Belvie CROME, MD;  Location: [facility];  Service: Urology;  Laterality: Bilateral;   CYSTOSCOPY W/ URETERAL STENT PLACEMENT  2010   lt-lazer stone   CYSTOSCOPY W/ URETERAL STENT PLACEMENT Bilateral 02/29/2020   Procedure: CYSTOSCOPY WITH BILATERAL RETROGRADE PYELOGRAM;BILATERAL URETERAL STENT PLACEMENT;  Surgeon: Sherrilee Belvie CROME, MD;  Location: [facility];  Service: Urology;  Laterality: Bilateral;   CYSTOSCOPY WITH STENT PLACEMENT Bilateral 09/08/2014   Procedure: CYSTOSCOPY WITH STENT PLACEMENT;  Surgeon: Norleen JINNY Seltzer, MD;  Location: [facility];  Service: Urology;  Laterality: Bilateral;   INTRAMEDULLARY (IM) NAIL INTERTROCHANTERIC Left 08/11/2022   Procedure: INTRAMEDULLARY (IM) NAIL INTERTROCHANTERIC;  Surgeon: Onesimo Oneil LABOR, MD;  Location: [facility];  Service: Orthopedics;  Laterality: Left;   SMALL INTESTINE SURGERY  2004   Iliectomy   THORACOTOMY  2007   vatz-rt upper lobe   THORACOTOMY  12/01/2005   VATS Right Upper Lung Lobe - Dr Brantley ([facility])   TONSILLECTOMY     TONSILLECTOMY      OB History   No obstetric history on file.      Home Medications    Prior to Admission medications  Medication Sig Start Date End Date Taking? Authorizing Provider  cephALEXin  (KEFLEX ) 500 MG capsule Take 1 capsule (500 mg total) by mouth 4 (four) times daily for 7 days. 01/05/25 01/12/25 Yes Johnie Flaming A, NP  mupirocin  ointment (BACTROBAN ) 2 % Apply 1 Application topically 2 (two) times daily. 01/05/25  Yes Johnie, Roshad Hack A, NP  predniSONE  (DELTASONE ) 20 MG tablet Take 2 tablets (40 mg total) by mouth daily for 5 days. 01/05/25 01/10/25 Yes Adalbert Alberto A, NP   acetaminophen  (TYLENOL ) 500 MG tablet Take 2 tablets (1,000 mg total) by mouth 3 (three) times daily. 08/14/22   Shahmehdi, Adriana LABOR, MD  Biotin  2.5 MG TABS Take 1 tablet by mouth daily.     [provider]  Calcium  Carb-Cholecalciferol  (CALCIUM  + D3 PO) Take 1 tablet by mouth daily with breakfast.    [provider]  cycloSPORINE  (RESTASIS ) 0.05 % ophthalmic emulsion Place 1 drop into both eyes 2 (two) times daily.     [provider]  enoxaparin  (LOVENOX ) 30 MG/0.3ML injection Inject 0.3 mLs (30 mg total) into the skin daily. 08/14/22 09/13/22  Willette Adriana LABOR, MD  escitalopram  (LEXAPRO ) 20 MG tablet Take 1 tablet (20 mg total) by mouth in the morning. 05/25/21   Lilland, Alana, DO  folic acid  (FOLVITE ) 1 MG tablet Take 1 tablet (1 mg total) by mouth daily. 05/25/21   Lilland, Alana, DO  furosemide  (LASIX ) 20 MG tablet Take 20 mg by mouth every morning. 08/06/22   [provider]  KEPPRA  500 MG tablet Take 1 tablet by mouth daily. 12/12/21   [provider]  levothyroxine (SYNTHROID) 50 MCG tablet Take 50 mcg by mouth every morning. 08/06/22   [provider]  metoprolol  succinate (TOPROL -XL) 25 MG 24 hr tablet Take 1 tablet (25 mg total) by mouth daily. 01/24/22   Evonnie Lenis, MD  Multiple Vitamin (MULTIVITAMIN WITH MINERALS) TABS tablet Take 1 tablet by mouth daily. 11/08/21   Briana Elgin LABOR, MD  potassium citrate (UROCIT-K) 10 MEQ (1080 MG) SR tablet Take 10 mEq by mouth daily.  03/22/19   [provider]  thiamine  100 MG tablet Take 100 mg by mouth daily.     [provider]    Family History Family History  Problem Relation Age of Onset   Other Mother        old age - died at age 2   Lung cancer Father     Social History Social History[1]   Allergies   Clindamycin, Clindamycin/lincomycin, Doxycycline, and Doxycycline hyclate   Review of Systems Review of Systems  Per HPI  Physical Exam Triage Vital  Signs ED Triage Vitals  Encounter Vitals Group     BP 01/05/25 1052 (!) 144/101     Girls Systolic BP Percentile --      Girls Diastolic BP Percentile --      Boys Systolic BP Percentile --      Boys Diastolic BP Percentile --      Pulse Rate 01/05/25 1052 67     Resp --      Temp 01/05/25 1052  98.1 F (36.7 C)     Temp src --      SpO2 01/05/25 1052 96 %     Weight --      Height --      Head Circumference --      Peak Flow --      Pain Score 01/05/25 1055 2     Pain Loc --      Pain Education --      Exclude from Growth Chart --    No data found.  Updated Vital Signs BP (!) 144/101 (BP Location: Right Arm)   Pulse 67   Temp 98.1 F (36.7 C)   SpO2 96%   Visual Acuity Right Eye Distance:   Left Eye Distance:   Bilateral Distance:    Right Eye Near:   Left Eye Near:    Bilateral Near:     Physical Exam Vitals and nursing note reviewed.  Constitutional:      General: She is awake. She is not in acute distress.    Appearance: Normal appearance. She is well-developed and well-groomed. She is not ill-appearing.  Cardiovascular:     Pulses:          Popliteal pulses are 2+ on the right side.       Dorsalis pedis pulses are 2+ on the right side.       Posterior tibial pulses are 2+ on the right side.     Comments: Mild pitting edema and erythema noted to right lower leg. Musculoskeletal:     Right lower leg: 1+ Pitting Edema present.       Legs:  Skin:    General: Skin is warm and dry.  Neurological:     General: No focal deficit present.     Mental Status: She is alert. Mental status is at baseline.  Psychiatric:        Behavior: Behavior is cooperative.      UC Treatments / Results  Labs (all labs ordered are listed, but only abnormal results are displayed) Labs Reviewed - No data to display  EKG   Radiology No results found.  Procedures Procedures (including critical care time)  Medications Ordered in UC Medications - No data to  display  Initial Impression / Assessment and Plan / UC Course  I have reviewed the triage vital signs and the nursing notes.  Pertinent labs & imaging results that were available during my care of the patient were reviewed by me and considered in my medical decision making (see chart for details).     Patient is overall well-appearing.  Vitals are stable.  Hip pain likely muscular in nature.  Prescribed prednisone  to help with inflammation related to this.  Findings to right lower leg concerning for cellulitis.  Started patient on cephalexin  for cellulitis coverage.  Provided basic wound care in clinic.  Prescribed mupirocin  to apply to wound as well.  Discussed proper wound care at home.  Discussed follow-up, return, and strict ER precautions. Final Clinical Impressions(s) / UC Diagnoses   Final diagnoses:  Left hip pain  Cellulitis of leg, left  Left leg swelling     Discharge Instructions      Start taking 2 tablets of prednisone  once daily for 5 days to help with hip pain. You can also take 500 to 1000 mg of Tylenol  every 6-8 hours as needed for hip pain. Alternate between ice and heat as needed for hip pain.  Start taking cephalexin  4 times daily for  7 days for cellulitis coverage. Also apply mupirocin  ointment twice daily to the wound on your right lower leg for infection coverage. Keep this area clean dry and covered. Clean with soap and water .  Follow-up with your primary care provider for further evaluation and management of this. Return here as needed. If you develop significantly increased swelling, spreading of redness, or fever please seek immediate medical treatment in the emergency department as this would indicate worsening infection.    ED Prescriptions     Medication Sig Dispense Auth. Provider   predniSONE  (DELTASONE ) 20 MG tablet Take 2 tablets (40 mg total) by mouth daily for 5 days. 10 tablet Johnie Flaming A, NP   cephALEXin  (KEFLEX ) 500 MG capsule  Take 1 capsule (500 mg total) by mouth 4 (four) times daily for 7 days. 28 capsule Johnie, Winford Hehn A, NP   mupirocin  ointment (BACTROBAN ) 2 % Apply 1 Application topically 2 (two) times daily. 22 g Johnie Flaming A, NP      PDMP not reviewed this encounter.    [1]  Social History Tobacco Use   Smoking status: Former    Current packs/day: 0.00    Types: Cigarettes    Quit date: 12/01/1996    Years since quitting: 28.1   Smokeless tobacco: Never  Vaping Use   Vaping status: Never Used  Substance Use Topics   Alcohol use: Yes    Comment: 5 cartons of wine a week.   Drug use: No     Johnie Flaming LABOR, NP 01/05/25 1135  "

## 2025-01-05 NOTE — Discharge Instructions (Addendum)
 Start taking 2 tablets of prednisone  once daily for 5 days to help with hip pain. You can also take 500 to 1000 mg of Tylenol  every 6-8 hours as needed for hip pain. Alternate between ice and heat as needed for hip pain.  Start taking cephalexin  4 times daily for 7 days for cellulitis coverage. Also apply mupirocin  ointment twice daily to the wound on your right lower leg for infection coverage. Keep this area clean dry and covered. Clean with soap and water .  Follow-up with your primary care provider for further evaluation and management of this. Return here as needed. If you develop significantly increased swelling, spreading of redness, or fever please seek immediate medical treatment in the emergency department as this would indicate worsening infection.
